# Patient Record
Sex: Male | Born: 1935 | Race: White | Hispanic: No | Marital: Married | Smoking: Former smoker
Health system: Southern US, Community
[De-identification: ages and names within clinical notes are randomized; demographics above are authoritative.]

## PROBLEM LIST (undated history)

## (undated) DIAGNOSIS — Z950 Presence of cardiac pacemaker: Secondary | ICD-10-CM

## (undated) DIAGNOSIS — F329 Major depressive disorder, single episode, unspecified: Secondary | ICD-10-CM

## (undated) DIAGNOSIS — F419 Anxiety disorder, unspecified: Secondary | ICD-10-CM

## (undated) DIAGNOSIS — N183 Chronic kidney disease, stage 3 unspecified: Secondary | ICD-10-CM

## (undated) DIAGNOSIS — I2510 Atherosclerotic heart disease of native coronary artery without angina pectoris: Secondary | ICD-10-CM

## (undated) DIAGNOSIS — M1990 Unspecified osteoarthritis, unspecified site: Secondary | ICD-10-CM

## (undated) DIAGNOSIS — I509 Heart failure, unspecified: Secondary | ICD-10-CM

## (undated) DIAGNOSIS — K219 Gastro-esophageal reflux disease without esophagitis: Secondary | ICD-10-CM

## (undated) DIAGNOSIS — E785 Hyperlipidemia, unspecified: Secondary | ICD-10-CM

## (undated) DIAGNOSIS — I4891 Unspecified atrial fibrillation: Secondary | ICD-10-CM

## (undated) DIAGNOSIS — E119 Type 2 diabetes mellitus without complications: Secondary | ICD-10-CM

## (undated) DIAGNOSIS — C679 Malignant neoplasm of bladder, unspecified: Secondary | ICD-10-CM

## (undated) DIAGNOSIS — N289 Disorder of kidney and ureter, unspecified: Secondary | ICD-10-CM

## (undated) DIAGNOSIS — Z8719 Personal history of other diseases of the digestive system: Secondary | ICD-10-CM

## (undated) DIAGNOSIS — F32A Depression, unspecified: Secondary | ICD-10-CM

## (undated) DIAGNOSIS — I10 Essential (primary) hypertension: Secondary | ICD-10-CM

## (undated) HISTORY — DX: Atherosclerotic heart disease of native coronary artery without angina pectoris: I25.10

## (undated) HISTORY — DX: Heart failure, unspecified: I50.9

## (undated) HISTORY — PX: HEMORRHOID SURGERY: SHX153

## (undated) HISTORY — DX: Unspecified atrial fibrillation: I48.91

## (undated) HISTORY — DX: Hyperlipidemia, unspecified: E78.5

## (undated) HISTORY — PX: CATARACT EXTRACTION W/ INTRAOCULAR LENS  IMPLANT, BILATERAL: SHX1307

## (undated) HISTORY — PX: NASAL SEPTUM SURGERY: SHX37

## (undated) HISTORY — DX: Essential (primary) hypertension: I10

## (undated) HISTORY — DX: Disorder of kidney and ureter, unspecified: N28.9

## (undated) HISTORY — PX: TRANSURETHRAL RESECTION OF PROSTATE: SHX73

---
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
## 1989-04-15 HISTORY — PX: CAROTID ENDARTERECTOMY: SUR193

## 2000-06-08 ENCOUNTER — Encounter: Payer: Self-pay | Admitting: *Deleted

## 2000-06-09 ENCOUNTER — Inpatient Hospital Stay (HOSPITAL_COMMUNITY): Admission: RE | Admit: 2000-06-09 | Discharge: 2000-06-10 | Payer: Self-pay | Admitting: *Deleted

## 2000-06-09 ENCOUNTER — Encounter (INDEPENDENT_AMBULATORY_CARE_PROVIDER_SITE_OTHER): Payer: Self-pay | Admitting: *Deleted

## 2002-08-15 DIAGNOSIS — I2510 Atherosclerotic heart disease of native coronary artery without angina pectoris: Secondary | ICD-10-CM

## 2002-08-15 HISTORY — DX: Atherosclerotic heart disease of native coronary artery without angina pectoris: I25.10

## 2003-02-06 ENCOUNTER — Ambulatory Visit (HOSPITAL_COMMUNITY): Admission: RE | Admit: 2003-02-06 | Discharge: 2003-02-06 | Payer: Self-pay | Admitting: Cardiology

## 2003-02-14 ENCOUNTER — Encounter: Payer: Self-pay | Admitting: Cardiothoracic Surgery

## 2003-02-19 ENCOUNTER — Encounter: Payer: Self-pay | Admitting: Cardiothoracic Surgery

## 2003-02-19 ENCOUNTER — Inpatient Hospital Stay (HOSPITAL_COMMUNITY): Admission: RE | Admit: 2003-02-19 | Discharge: 2003-02-24 | Payer: Self-pay | Admitting: Cardiothoracic Surgery

## 2003-02-19 HISTORY — PX: CORONARY ARTERY BYPASS GRAFT: SHX141

## 2003-02-20 ENCOUNTER — Encounter: Payer: Self-pay | Admitting: Cardiothoracic Surgery

## 2003-02-21 ENCOUNTER — Encounter: Payer: Self-pay | Admitting: Cardiothoracic Surgery

## 2003-02-22 ENCOUNTER — Encounter: Payer: Self-pay | Admitting: Surgery

## 2003-03-17 ENCOUNTER — Encounter (HOSPITAL_COMMUNITY): Admission: RE | Admit: 2003-03-17 | Discharge: 2003-06-15 | Payer: Self-pay | Admitting: Cardiology

## 2003-06-02 ENCOUNTER — Encounter (INDEPENDENT_AMBULATORY_CARE_PROVIDER_SITE_OTHER): Payer: Self-pay

## 2003-06-02 ENCOUNTER — Ambulatory Visit (HOSPITAL_COMMUNITY): Admission: RE | Admit: 2003-06-02 | Discharge: 2003-06-02 | Payer: Self-pay | Admitting: Urology

## 2003-06-02 ENCOUNTER — Ambulatory Visit (HOSPITAL_BASED_OUTPATIENT_CLINIC_OR_DEPARTMENT_OTHER): Admission: RE | Admit: 2003-06-02 | Discharge: 2003-06-02 | Payer: Self-pay | Admitting: Urology

## 2005-01-04 ENCOUNTER — Inpatient Hospital Stay (HOSPITAL_BASED_OUTPATIENT_CLINIC_OR_DEPARTMENT_OTHER): Admission: RE | Admit: 2005-01-04 | Discharge: 2005-01-04 | Payer: Self-pay | Admitting: Cardiology

## 2006-09-16 ENCOUNTER — Emergency Department (HOSPITAL_COMMUNITY): Admission: EM | Admit: 2006-09-16 | Discharge: 2006-09-16 | Payer: Self-pay | Admitting: Emergency Medicine

## 2008-03-14 ENCOUNTER — Ambulatory Visit: Payer: Self-pay | Admitting: Internal Medicine

## 2008-03-31 ENCOUNTER — Ambulatory Visit: Payer: Self-pay | Admitting: Internal Medicine

## 2008-03-31 LAB — CONVERTED CEMR LAB
Basophils Relative: 0.8 % (ref 0.0–3.0)
CO2: 28 meq/L (ref 19–32)
Calcium: 9 mg/dL (ref 8.4–10.5)
Creatinine, Ser: 1.5 mg/dL (ref 0.4–1.5)
GFR calc Af Amer: 58 mL/min
Glucose, Bld: 131 mg/dL — ABNORMAL HIGH (ref 70–99)
HCT: 40.4 % (ref 39.0–52.0)
Hemoglobin: 14.1 g/dL (ref 13.0–17.0)
INR: 1.9 — ABNORMAL HIGH (ref 0.8–1.0)
Lymphocytes Relative: 19.9 % (ref 12.0–46.0)
MCHC: 34.9 g/dL (ref 30.0–36.0)
Monocytes Absolute: 0.9 10*3/uL (ref 0.1–1.0)
Monocytes Relative: 9.6 % (ref 3.0–12.0)
Neutro Abs: 6.1 10*3/uL (ref 1.4–7.7)
Prothrombin Time: 20.8 s — ABNORMAL HIGH (ref 10.9–13.3)
RBC: 4.04 M/uL — ABNORMAL LOW (ref 4.22–5.81)
RDW: 12.2 % (ref 11.5–14.6)

## 2008-04-04 ENCOUNTER — Ambulatory Visit (HOSPITAL_COMMUNITY): Admission: RE | Admit: 2008-04-04 | Discharge: 2008-04-05 | Payer: Self-pay | Admitting: Internal Medicine

## 2008-04-04 ENCOUNTER — Ambulatory Visit: Payer: Self-pay | Admitting: Internal Medicine

## 2008-04-30 ENCOUNTER — Ambulatory Visit: Payer: Self-pay

## 2008-07-22 ENCOUNTER — Ambulatory Visit: Payer: Self-pay | Admitting: Internal Medicine

## 2008-09-02 ENCOUNTER — Ambulatory Visit: Payer: Self-pay | Admitting: *Deleted

## 2008-09-25 ENCOUNTER — Encounter: Payer: Self-pay | Admitting: Internal Medicine

## 2009-02-26 ENCOUNTER — Encounter: Payer: Self-pay | Admitting: Internal Medicine

## 2009-04-08 ENCOUNTER — Encounter: Admission: RE | Admit: 2009-04-08 | Discharge: 2009-04-08 | Payer: Self-pay | Admitting: Cardiology

## 2009-04-17 HISTORY — PX: CARDIOVASCULAR STRESS TEST: SHX262

## 2009-04-17 HISTORY — PX: US ECHOCARDIOGRAPHY: HXRAD669

## 2009-04-29 ENCOUNTER — Encounter (INDEPENDENT_AMBULATORY_CARE_PROVIDER_SITE_OTHER): Payer: Self-pay | Admitting: *Deleted

## 2010-03-26 ENCOUNTER — Encounter (INDEPENDENT_AMBULATORY_CARE_PROVIDER_SITE_OTHER): Payer: Self-pay | Admitting: *Deleted

## 2010-04-01 ENCOUNTER — Encounter: Payer: Self-pay | Admitting: Internal Medicine

## 2010-08-30 ENCOUNTER — Ambulatory Visit: Payer: Self-pay | Admitting: Cardiology

## 2010-09-01 ENCOUNTER — Encounter (INDEPENDENT_AMBULATORY_CARE_PROVIDER_SITE_OTHER): Payer: Self-pay | Admitting: *Deleted

## 2010-09-16 NOTE — Letter (Signed)
Summary: Appointment - Reminder 2  [facility], Main Office  [address]   [place], [place] [address]   Phone: [phone]  Fax: [phone]     September 01, 2010 MRN: [id number]   [name] [address] [place], [place]  [address]   Dear [name],  Our records indicate that it is past time to schedule a follow-up appointment.  Dr.Klein recommended that you follow up with [place] in December 2009 and we have been trying to contact you. It is very important that we reach you to schedule this appointment. We look forward to participating in your health care needs. Please contact us at the number listed above at your earliest convenience to schedule your appointment.  If you are unable to make an appointment at this time, give [place] a call so we can update our records.     Sincerely,   Glass blower/designer

## 2010-09-16 NOTE — Cardiovascular Report (Signed)
Summary: Certified Letter - Delivered (following [place] Card.)  Certified Letter - Delivered (following [place] Card.)   Imported By: Debby Freiberg 06/21/2010 14:18:04  _____________________________________________________________________  External Attachment:    Type:   Image     Comment:   External Document

## 2010-09-16 NOTE — Letter (Signed)
Summary: Device-Delinquent Check  [facility], Main Office  [address]   [place], [place] [address]   Phone: [phone]  Fax: [phone]     March 26, 2010 MRN: [id number]   [facility] [address] [place], [place]  [address]   Dear Mr. BROCKBANK,  According to our records, you have not had your implanted device checked in the recommended period of time.  We are unable to determine appropriate device function without checking your device on a regular basis.  Please call our office to schedule an appointment, with Dr. Graciela Husbands,  as soon as possible.  If you are having your device checked by another physician, please call us so that we may update our records.  Thank you, Altha Harm, LPN  March 26, 2010 2:09 PM   [facility]  certified mail

## 2010-12-28 NOTE — Letter (Signed)
March 14, 2008    Peter M. Swaziland, M.D.  [address]  [place], [place] [address]   RE:  DYWANE, PERUSKI  MRN:  [id number]  /  DOB:  12-09-1924   Dear Theron Arista,   It was a pleasure to see Redell Nazir today.  I am sorry I am not going to  get a chance to talk to you as I will be out of town next week. As you  know, Mr. Rhinehart is a delightful 77 year old gentleman with ischemic  heart disease who underwent bypass surgery in 2004, after presenting  with congestive heart failure.  He has I think from what I am inferring  a mixed cardiomyopathy with global hypokinesia and no specific wall  motion abnormalities.  Functionally, he has done amazingly well since  his bypass surgery and can do just about anything he wants.  He denies  shortness of breath, nocturnal dyspnea, edema or fatigue.   He also denies syncope.  He does have palpitations occasionally, which  are related to chronic atrial fibrillation.  He also has known chronic  bifascicular block.   His thromboembolic risk factors are notable for:  A.  Hypertension.  B.  Diabetes.  C.  Age.  D.  LV dysfunction.   He has had no prior strokes.   PAST MEDICAL HISTORY:  In addition to the above is notable for:  1. Bladder cancer - recurrent.  2. Gout.  3. Hiatal hernia.  4. Psychosocial stress.   PAST SURGICAL HISTORY:  1. Bladder cancer.  2. Carotid endarterectomy.  3. Bypass surgery.   MEDICATIONS:  1. Metformin 500 b.i.d.  2. Aspirin 81.  3. Lisinopril 40.  4. Lovastatin 20.  5. Niacin 1000.  6. Furosemide 40.  7. Calcium and warfarin.  He does not take beta-blockers because of      bradycardia that occurred while he was in sinus rhythm that raised      to the 50s, and he is ALLERGIC TO PENICILLIN.   SOCIAL HISTORY:  He is married.  He is subsequently remarried.  He has  two children, one of whom is gay and the other one committed suicide  about 6 years ago.  He does not use cigarettes or recreational drugs.  He  does drink alcohol daily.  He is quite vigorous and active.   PHYSICAL EXAMINATION:  GENERAL:  He is an elderly Caucasian male  appearing younger than his stated age of 10.  VITAL SIGNS:  His blood pressure is 126/76, his pulse was 86.  His  weight was 185.  HEENT:  Demonstrated no icterus or xanthomata.  NECK:  His neck veins were flat.  His carotids are brisk and full.  I  did not hear a bruit.  He had a scar on his right neck.  BACK:  Without kyphosis or scoliosis.  LUNGS:  Clear.  HEART:  Sounds were irregular with a 2/6 murmur.  ABDOMEN:  Soft with active bowel sounds without midline pulsation or  hepatomegaly.  EXTREMITIES:  Femoral pulses were 2+, distal pulses were intact.  There  is no clubbing, cyanosis or edema.  NEUROLOGICAL:  Grossly normal.  SKIN:  Warm and dry.   Electrocardiogram dated today demonstrated atrial fibrillation with  bursts of rapid rates and then slowing of rates, the mean was about 80.  The axis was leftward.  There was a right bundle branch block with  intervals of - 0.16/0.40.   IMPRESSION:  1. Ischemic cardiomyopathy.  a.     Status post bypass surgery.      b.     Ejection fraction of 20-25%.      c.     Global hypokinesia.  2. Chronic bifascicular block.  3. Permanent atrial fibrillation with a controlled ventricular      response.  4. Psychosocial stress is noted.  5. Peripheral vascular disease with prior carotid endarterectomy.  6. Class I congestive symptoms.   DISCUSSION:  Theron Arista, Mr. Innes has an ischemic cardiomyopathy with a  depressed left ventricular function and his functional status is really  quite good.  There is agroup of patients in the major 2 trial that was  found to have improved survival with ICD implantation for primary  prevention, and we reviewed this data and he was agreeable to  proceeding.  We discussed the potential benefits, as well as the  potential risks, including but not limited to device malfunction,   inappropriate shocks, death, perforation, infection and lead  dislodgement, and he would like to proceed.   The other question that I had for you is whether now that he is an  atrial fibrillation and will get back at brady pacing whether it is  worth trying to reinitiate beta-blocker therapy for him.   I told him I would defer this to your expertise.    Sincerely,      Duke Salvia, MD, [facility]  Electronically Signed    SCK/MedQ  DD: 03/14/2008  DT: 03/14/2008  Job #: [id number]

## 2010-12-28 NOTE — Procedures (Signed)
CAROTID DUPLEX EXAM   INDICATION:  Follow-up carotid artery disease.  Known left ICA  occlusion.   HISTORY:  Diabetes:  Yes.  Cardiac:  CABG, CHF.  Hypertension:  Yes.  Smoking:  Quit in 1961.  Previous Surgery:  Right CEA in 2001 by Dr. Madilyn Fireman.  CV History:  No.  Amaurosis Fugax No, Paresthesias No, Hemiparesis No.                                       RIGHT             LEFT  Brachial systolic pressure:         148               150  Brachial Doppler waveforms:         WNL               WNL  Vertebral direction of flow:        Antegrade         Antegrade  DUPLEX VELOCITIES (cm/sec)  CCA peak systolic                   61                57  ECA peak systolic                   104               252  ICA peak systolic                   76                Occluded  ICA end diastolic                   31                Occluded  PLAQUE MORPHOLOGY:                  N/A               Mixed  PLAQUE AMOUNT:                      N/A               Occluded ICA  PLAQUE LOCATION:                    N/A               ICA/ECA   IMPRESSION:  1. Right internal carotid artery shows no evidence of restenosis,      status post carotid endarterectomy.  2. Left internal carotid artery known occlusion.  3. Left external carotid artery stenosis.   ___________________________________________  P. Liliane Bade, M.D.   AS/MEDQ  D:  09/02/2008  T:  09/02/2008  Job:  [id number]

## 2010-12-28 NOTE — Discharge Summary (Signed)
[name], [name] NO.:  [id number]   MEDICAL RECORD NO.:  [id number]          PATIENT TYPE:  INP   LOCATION:  2035                         FACILITY:  [facility]   PHYSICIAN:  Duke Salvia, MD, FACCDATE OF BIRTH:  12-04-1924   DATE OF ADMISSION:  04/04/2008  DATE OF DISCHARGE:  04/05/2008                               DISCHARGE SUMMARY   ALLERGIES:  This patient has allergy to PENICILLIN.   TIME FOR THIS DICTATION AND EXAM:  Greater than 40 minutes.   FINAL DIAGNOSIS:  On discharging day #1, status post implant of a  Medtronic VIRTUOSO dual-chamber implantable cardioverter-  defibrillator/defibrillation threshold less than or equal to 20 joules.  Device implanted for primary prevention.   SECONDARY DIAGNOSES:  1. Ischemic cardiomyopathy.      a.     Echocardiogram in June 2009 ejection fraction of 25-30%.       Echocardiogram in August 2009 ejection fraction 25% despite       maximized medical therapy.  2. New York Heart Association class II.  3. Chronic systolic congestive heart failure.  4. History of coronary artery disease status post coronary artery      bypass graft surgery in 2004.      a.     A catheterization in May 2006 all grafts patent.  5. Atrial fibrillation, atrial flutter/Coumadin.  6. Diabetes.  7. Chronic renal insufficiency.  8. Hypertension.  9. History of bladder cancer with recurrence.  10.Gout.  11.Hiatal hernia.   PROCEDURE:  On April 05, 2008, implant of a Medtronic Virtuoso dual-  chamber cardioverter defibrillator/defibrillator threshold study less  than or equal to 20 joules, Dr. Sherryl Manges.   BRIEF HISTORY:  [name] is an 77 year old male.  He had coronary  bypass in 2004, at that time he was in congestive heart failure.  The  patient does have a mixed cardiomyopathy with global hypokinesia and no  specific wall motion abnormalities.  The patient has been maintained on  maximum medical therapy, but his ejection  fraction has not improved  thereby.   The patient denies syncope.  He does have palpitations occasionally.  These are related to his chronic atrial fibrillation.  He also has  chronic bifascicular block.  Electrocardiogram shows atrial fibrillation  and right bundle-branch block.   [name] has an ischemic cardiomyopathy.  He has depressed left  ventricular function.  His functional status is good.  There are  studies, which improved survival with ICD implantation for primary  prevention.  The risks and benefits of this implantation had been  discussed with the patient and he wishes to proceed.   HOSPITAL COURSE:  The patient presents electively on April 04, 2008.  He underwent implantation of the Medtronic device by Dr. Graciela Husbands as a dual-  chamber device.  The question remains whether the patient could possibly  be up titrated to a beta-blocker now that he has backup pacing.  The  chest x-ray will be examined on postprocedure day #1.  The device will  be interrogated.  Mobility of the left arm will  be discussed with the  patient.  He will receive oral analgesia for any discomfort.  He will  discharge on the following medicines:  1. Coumadin 1 mg on Tuesday, Thursday, Saturday, and Sunday and 2 mg      on Monday, Wednesday, and Friday.  2. Metformin 500 mg twice daily.  3. Enteric-coated aspirin 81 mg daily.  4. Lisinopril 20 mg on Sunday and 40 mg on Tuesday, Thursday, and      Saturday.  5. Lovastatin 20 mg daily at bedtime.  6. Niacin 1000 mg daily.  7. Lasix 40 mg daily.  8. Calcium 600 mg twice daily.  He was asked to keep his incision dry for the next 7 days and to sponge  bathe until next Friday, April 11, 2008.  He follows up with [employer], [facility] at [employer] for the [facility].  Our office  will call with that appointment.   LABORATORY STUDIES:  Pertinent to this admission were drawn on March 31, 2008, white cells 9.5, hemoglobin 14.1, hematocrit  40.4, platelets  of 232, protime 20.8, INR 1.9, sodium 138, potassium 4.5, chloride 104,  carbonate 28, glucose 131, creatinine is 1.5, and BUN is 25.      Maple Mirza, [place]      Duke Salvia, MD, [facility]  Electronically Signed    GM/MEDQ  D:  04/04/2008  T:  04/05/2008  Job:  [id number]   cc:   Peter M. Swaziland, M.D.  Barry Dienes Eloise Harman, M.D.

## 2010-12-28 NOTE — Assessment & Plan Note (Signed)
[facility]                         ELECTROPHYSIOLOGY OFFICE NOTE   JAYMIE, MISCH                         MRN:          [id number]  DATE:07/22/2008                            DOB:          1924-09-26    Mr. Ausburn was seen in followup for an ICD implanted for primary  prevention of sudden ischemic heart disease.  He has permanent atrial  fibrillation (relatively) having gone into sinus rhythm at the time of  his ICD.  He maintained sinus rhythm for a couple of months.  He noted  no change in his symptoms in the interval in 4 months.  He has no  complaints of chest pain, shortness of breath, or exercise intolerance  at this time.   His medications are unchanged and notable for Warfarin as well as  metformin, lisinopril, lovastatin, and niacin.  He is notably not on the  beta-blocker.   On examination, his blood pressure was 139/82.  His pulse is 54, hence  the absence of a beta-blocker.  The lungs were clear.  Heart sounds were  regular.  Extremities were without edema.   Interrogation of his Medtronic Virtuoso device demonstrates a flutter  wave of 6.2 with impedance of 568 and R wave of 3.6 with impedance of  400, threshold of 1 volt at 0.4, battery voltage was 3.2.   IMPRESSION:  1. Atrial fibrillation.  2. Ischemic cardiomyopathy.  3. Status post implantable cardioverter-defibrillator for the above.   We will see him again in nine months' time at his anniversary in the  event that Dr. Thomasene Lot will like to assume all of his care.  I will look  forward to hearing from him about that.     Duke Salvia, MD, [facility]  Electronically Signed    SCK/MedQ  DD: 07/22/2008  DT: 07/23/2008  Job #: [id number]   cc:   Peter M. Swaziland, M.D.

## 2010-12-31 NOTE — Discharge Summary (Signed)
NAME:  [name], ARNS                            ACCOUNT NO.:  [id number]   MEDICAL RECORD NO.:  [id number]                   PATIENT TYPE:  INP   LOCATION:  2029                                 FACILITY:  [facility]   PHYSICIAN:  Gwenith Daily. Tyrone Sage, M.D.            DATE OF BIRTH:  1924/12/21   DATE OF ADMISSION:  02/19/2003  DATE OF DISCHARGE:  02/24/2003                                 DISCHARGE SUMMARY   ADMITTING DIAGNOSES:  1. Coronary artery disease.  2. Congestive heart failure.   ADDITIONAL DISCHARGE DIAGNOSES:  1. Coronary artery disease.  2. New-onset congestive heart failure.  3. Pulmonary hypertension.  4. Status post right carotid endarterectomy.  5. Hypertension.  6. Type 2 non-insulin-dependent diabetes mellitus.  7. Hypercholesterolemia.  8. History of bladder cancer.   PROCEDURES PERFORMED:  1. Coronary artery bypass grafting x2 (left internal mammary artery to the     LAD, saphenous vein graft to the circumflex coronary).  2. Endoscopic vein harvest, right thigh.   HISTORY:  The patient is a 77 year old white male with a one-month history  of cough and progressive shortness of breath on exertion.  He denied chest  pain, diaphoresis, nausea or vomiting with his symptoms.  He initially saw  Dr. Eloise Harman, who diagnosed him congestive heart failure and started him on  hydrochlorothiazide and Diovan.  He has had marked clinical improvement  since that time.  He underwent an echocardiogram on January 24, 2003, which  showed mild left ventricular enlargement with severe diastolic dysfunction.  Ejection fraction was 25-30% with severe hypokinesis in the anterior and  lateral walls.  He is also noted to have bi-atrial enlargement and severe  pulmonary hypertension.  Because of these findings and multiple risk factors  for coronary artery disease, he was referred to Dr. Peter Swaziland.  He  underwent cardiac catheterization, which showed severe coronary artery  disease, which  was not felt to be amenable to percutaneous intervention.  He  was seen by Dr. Sheliah Plane and was felt to be a good candidate for  surgical revascularization.  After a complete outpatient workup, he agreed  to proceed.   HOSPITAL COURSE:  He was admitted on July 07 and taken to the operating  room, where he underwent CABG x2 with the above-noted grafts.  He tolerated  the procedure well.  Was transferred to the SICU in stable condition.  He  was extubated shortly after surgery.  He was hemodynamically stable,  although on milrinone and dopamine drips, post-operatively.  He was finally  weaned, and drips were discontinued.   By postop day #2, blood pressures were stable, off all drips.  He did  require atrial-pacing to maintain heart rate, as his sinus rhythm was  bradycardic in the 50s.  His beta blocker was held, and he was transferred  to the floor.  He was restarted on his home dose of Glucophage.  Postop day #3, his A pacer was weaned and discontinued.  Since that time, he  has been maintaining sinus rhythm with his intrinsic rate around 65.  He has  been afebrile, and all vital signs have been stable.  He has been diuresed,  although he is still about 9 pounds above his preoperative weight.  His  blood sugars have remained stable in the 100-120 range on his home  medications.  His surgical incision sites are healing well.  He has been  ambulating in the halls without difficulty and has been weaned off  supplemental oxygen, maintaining greater than 90% O2 sats on room air.  It  is felt that since he is doing well, if he remains stable, he will be ready  for discharge home on February 24, 2003.   DISCHARGE MEDICATIONS:  1. Tylox 1-2 q.4h. p.r.n. for pain.  2. Enteric-coated aspirin 325 mg q.d.  3. Lasix 40 mg q.d. x1 week.  4. K-Dur 20 mEq q.d. x1 week.  5. Mevacor 20 mg q.d.  6. Niacin 500 mg q.d.  7. Diovan 80 mg b.i.d.  8. Glucophage 500 mg b.i.d.   DISCHARGE  INSTRUCTIONS:  1. He is to refrain from driving, heavy lifting or strenuous activity.  2. He may continue to increase ambulation and use of his incentive     spirometer.  3. He was asked to shower daily and cleanse incisions with soap and water.   DIET:  He will continue a low-fat, low-sodium diet.   DISCHARGE FOLLOW UP:  1. He has a scheduled appointment to see Dr. Roger Shelter in 2 weeks and have a     chest x-ray at that visit.  2. Dr. Dennie Maizes office will call and set up an appointment for him to see     Dr. Tyrone Sage in 3 weeks, and he should bring his chest x-ray to this     appointment for Dr. Tyrone Sage to review.  3. He will call our office in the interim if he experiences any problems or     has questions.     Coral Ceo, P.A.                        Gwenith Daily Tyrone Sage, M.D.    GC/MEDQ  D:  02/23/2003  T:  02/24/2003  Job:  [id number]   cc:   Peter M. Swaziland, M.D.  [address]  [place], [place] [address]  Fax: [phone]   Barry Dienes. Eloise Harman, M.D.  [address]  [place]  [place] [address]  Fax: [phone]    cc:   Peter M. Swaziland, M.D.  [address]  [place], [place] [address]  Fax: [phone]   Barry Dienes. Eloise Harman, M.D.  [address]  [place]  [place] [address]  Fax: [phone]

## 2010-12-31 NOTE — H&P (Signed)
NAMEJAYMOND, WAAGE NO.:  [id number]   MEDICAL RECORD NO.:  [id number]                  PATIENT TYPE:  OIB   LOCATION:                                       FACILITY:  [facility]   PHYSICIAN:  Peter M. Swaziland, M.D.               DATE OF BIRTH:  15-Oct-1924   DATE OF ADMISSION:  02/06/2003  DATE OF DISCHARGE:                                HISTORY & PHYSICAL   HISTORY OF PRESENT ILLNESS:  The patient is a 77 year old white male with  history of non-insulin-dependent diabetes mellitus, hypertension, and  hyperlipidemia who presents with new onset of congestive heart failure.  He  developed two-month history of nocturnal wheezing associated with tightness  in his throat and chest.  He has complained of progressive shortness of  breath and cough.  On further evaluation by Barry Dienes. Eloise Harman, M.D. he is  felt to have congestive heart failure based on his examination and chest x-  ray.  He was started on hydrochlorothiazide and Diovan with clinical  improvement.  Subsequent echocardiogram was performed on January 24, 2003.  This showed mild left ventricular enlargement with severe systolic  dysfunction, ejection fraction of 25-30%.  There was global hypokinesia with  more severe hypokinesia of the anterolateral wall and restrictive  physiology.  He was also noted to have biatrial enlargement and severe  pulmonary hypertension.  Due to these findings and the fact that he has  multiple cardiac risk factors, he is now admitted for a cardiac  catheterization.   PAST MEDICAL HISTORY:  1. Diabetes mellitus type 2.  2. Hypertension.  3. Hypercholesterolemia.  4. Status post right carotid endarterectomy.  Documented occlusion of the     left internal carotid artery.  5. Status post removal of bladder tumor by Maretta Bees. Vonita Moss, M.D.  6. Status post T&A and nasal surgery.  7. History of seven rib fractures and clavicle fracture related to trauma.   ALLERGIES:   PENICILLIN.   MEDICATIONS:  1. Metformin 500 mg p.o. b.i.d.  2. Lovastatin 20 mg daily.  3. Niacin 500 mg daily.  4. Aspirin 81 mg daily.  5. Diovan 80 mg b.i.d.  6. HCTZ 12.5 mg daily.   SOCIAL HISTORY:  The patient is retired.  He quit smoking 40 years ago.  He  is married and has one living child.  One child died this past year of  suicide.   FAMILY HISTORY:  Father died of unknown causes.  Mother died with myocardial  infarction.  He has no siblings.   REVIEW OF SYSTEMS:  As noted in HPI.  He has had no hematuria recently.  He  denies any increased edema.  No history of TIA or stroke.  No prior history  of myocardial infarction.  All other review of systems are negative.   PHYSICAL EXAMINATION:  GENERAL:  The patient is a  well-developed white male  in no apparent distress.  VITAL SIGNS:  Weight 139, blood pressure 142/80, pulse 60 and regular.  HEENT:  Pupils are equal, round, and reactive.  Conjunctivae are clear.  Oropharynx is clear.  NECK:  Without JVD, adenopathy, or thyromegaly.  He has old right carotid  endarterectomy scar.  He also has deformation of his left clavicle.  LUNGS:  Clear to auscultation and percussion.  CARDIAC:  Regular rate and rhythm without S3.  There is soft systolic murmur  at the left sternal border.  ABDOMEN:  Soft and nontender without masses or hepatosplenomegaly.  EXTREMITIES:  Femoral and pedal pulses are 2+ and symmetric.  He has no  edema.  NEUROLOGIC:  Nonfocal.   LABORATORY DATA:  ECG shows normal sinus rhythm, first degree AV block, left  anterior fascicular block, right bundle branch block.  There is also  evidence of LVH by voltage.  Chest x-ray shows cardiomegaly with mild  effusions.  BNP was 405.  White count 9300, hemoglobin 16.5, hematocrit  49.8, platelets 286,000.  Other laboratories were pending.   IMPRESSION:  1. New onset of congestive heart failure with severe left ventricular     dysfunction.  2. Severe pulmonary  hypertension.  3. Trifascicular block.  4. Carotid arterial disease status post right carotid endarterectomy with     documented occlusion of the left carotid.  5. Hypertension.  6. Diabetes mellitus type 2.  7. Hypercholesterolemia.  8. History of bladder tumor.   PLAN:  The patient will be admitted for right and left heart  catheterization, coronary angiography.  His Diovan dose was increased.  If  his filling pressures remain high, may recommend switching him to a loop  diuretic.  At this point would avoid beta blocker therapy because of his  significant conduction abnormalities on ECG and low resting pulse.                                               Peter M. Swaziland, M.D.    PMJ/MEDQ  D:  02/01/2003  T:  02/03/2003  Job:  [id number]   cc:   Barry Dienes. Eloise Harman, M.D.  [address]  [place]  [place] [address]  Fax: [phone]    cc:   Barry Dienes. Eloise Harman, M.D.  [address]  [place]  [place] [address]  Fax: [phone]

## 2010-12-31 NOTE — Cardiovascular Report (Signed)
NAME:  [name], [name]                            ACCOUNT NO.:  [id number]   MEDICAL RECORD NO.:  [id number]                   PATIENT TYPE:  OIB   LOCATION:  2856                                 FACILITY:  [facility]   PHYSICIAN:  Peter M. Swaziland, M.D.               DATE OF BIRTH:  Nov 25, 1924   DATE OF PROCEDURE:  02/06/2003  DATE OF DISCHARGE:                              CARDIAC CATHETERIZATION   INDICATION FOR PROCEDURE:  The patient is a 77 year old white male with  history of diabetes mellitus, hypertension, hyperlipidemia who presents with  new onset congestive heart failure.  Echocardiogram shows severe left  ventricular dysfunction and pulmonary hypertension.   ACCESS:  Via the right femoral artery and vein using standard Seldinger  technique.   EQUIPMENT:  6-French 4 cm right and left Judkins catheter, 6-French pigtail  catheter, 6-French arterial sheath, 8-French venous sheath, 7-French balloon  tip Swan-Ganz catheter.   CONTRAST:  140 mL of Omnipaque.   MEDICATIONS:  Local anesthesia 1% Xylocaine.   HEMODYNAMIC DATA:  1. Right atrial pressure is 3/3 with a mean of 2 mmHg.  2. Right ventricular pressure is 56 with an EDP of 4 mmHg.  3. Pulmonary artery pressure is 60/24 with a mean of 38 mmHg.  4. Pulmonary capillary wedge pressure 17/21 with a mean of 15 mmHg.  5. Aortic pressure is 153/73 with a mean of 106 mmHg.  6. Left ventricular pressure is 147 with an EDP of 19 mmHg.  7. There is no aortic or mitral valve gradient.  8. Cardiac output by thermodilution is 4.8 L/minute with an index of 2.74.     By Fick cardiac output is 3.7 with an index of 2.11.   ANGIOGRAPHIC DATA:  1. Left ventricular angiography was performed in the RAO view.  This     demonstrates mild left ventricular dilatation with severe global     hypokinesia.  Ejection fraction is estimated at 30%.  There is mild     mitral insufficiency noted.  2. The left coronary artery arises and distributes  normally.  The left main     coronary artery is calcified with 50% narrowing in the distal left main.  3. There is 50% stenosis at the origin of the LAD.  The remainder of the     vessel is without significant obstructive disease.  4. The left circumflex coronary artery is heavily calcified proximally.     There is a 90-95% ostial stenosis.  The left circumflex gives rise to a     single large marginal branch which is without significant disease.  5. The right coronary artery arises and distributes normally.  It has     scattered minor disease in the proximal and mid vessel up to 20%.   FINAL INTERPRETATION:  1. Two vessel obstructive atherosclerotic coronary artery disease involving     the ostia of the left  anterior descending and left circumflex coronary     artery as well as moderate disease involving the distal left main     coronary artery.     The left circumflex lesion is critical and heavily calcified.  2. Severe left ventricular dysfunction.  3. Moderate pulmonary hypertension with relatively normal left ventricular     filling pressures.                                               Peter M. Swaziland, M.D.    PMJ/MEDQ  D:  02/06/2003  T:  02/06/2003  Job:  [id number]  Barry Dienes. Eloise Harman, M.D.  [address]  [place]  [place] [address]  Fax: [phone]   cc:   Barry Dienes. Eloise Harman, M.D.  [address]  [place]  [place] [address]  Fax: [phone]

## 2010-12-31 NOTE — Cardiovascular Report (Signed)
NAMEKARLTON, MAYA NO.:  [id number]   MEDICAL RECORD NO.:  [id number]          PATIENT TYPE:  OIB   LOCATION:  6501                         FACILITY:  [facility]   PHYSICIAN:  Peter M. Swaziland, M.D.  DATE OF BIRTH:  14-Sep-1924   DATE OF PROCEDURE:  01/04/2005  DATE OF DISCHARGE:                              CARDIAC CATHETERIZATION   INDICATIONS FOR PROCEDURE:  77 year old white male who is status post  coronary artery bypass surgery. Recent stress Cardiolite study suggested  evidence of anterior lateral ischemia with apical infarction. Ejection  fraction was depressed at 40%. Cardiac catheterization was indicated to rule  out graft failure.   PROCEDURES:  Left heart catheterization, coronary and left ventricular  angiography, saphenous vein graft angiography x1 and LIMA graft angiography.   EQUIPMENT USED:  A 4-French 4 cm right and left Judkins catheter, 4-French  pigtail catheter, 4-French arterial sheath.   ACCESS:  Via the right femoral artery using standard Seldinger technique.   MEDICATIONS:  Local anesthesia 1% Xylocaine.   CONTRAST:  Omnipaque 110 mL.   HEMODYNAMIC DATA:  Aortic pressure 156/68 with a mean of 103. Left ventricle  pressure is 162 with EDP of 18 mmHg.   ANGIOGRAPHIC DATA:  1.  The left coronary artery arises and distributes normally. There is a      bulky plaque in the distal left main coronary artery with 70% stenosis.      This is also moderately calcified.  2.  The left anterior descending artery has approximately 40% narrowing at      the ostium. There is competitive flow to the mid and distal LAD from the      IMA graft. The first diagonal branches without significant disease.  3.  The left circumflex coronary artery has a 90% ostial stenosis. The      marginal branch has competitive flow from the vein graft.  4.  The right coronary artery has mild irregularities approximately 10% in      the proximal mid vessel. No obstructive  disease is noted.  5.  Saphenous vein graft to the first obtuse marginal vessel was widely      patent with excellent distal runoff.  6.  The LIMA graft to the LAD is also widely patent with excellent distal      runoff.  7.  Left ventricular angiography was performed in the RAO view. This      demonstrates upper normal left ventricular size with global hypokinesia      and overall ejection fraction estimated at 40%. There is no significant      mitral insufficiency.   FINAL INTERPRETATION:  1.  Severe left main and two-vessel obstructive atherosclerotic coronary      artery disease.  2.  Patent LIMA graft to the LAD.  3.  Patent saphenous vein graft to obtuse marginal branch.  4.  Moderate left ventricular dysfunction.   PLAN:  Would recommend continued medical therapy.      PMJ/MEDQ  D:  01/04/2005  T:  01/04/2005  Job:  [id number]   cc:  Barry Dienes Eloise Harman, M.D.  [address]  [place]  [place] [address]  Fax: [phone]   Maretta Bees. Vonita Moss, M.D.  [address]  [place]  [place] [address]  Fax: [phone]

## 2010-12-31 NOTE — H&P (Signed)
[name], [name] NO.:  [id number]   MEDICAL RECORD NO.:  [id number]          PATIENT TYPE:  AMB   LOCATION:                               FACILITY:  [facility]   PHYSICIAN:  Peter M. Swaziland, M.D.  DATE OF BIRTH:  23-Dec-1924   DATE OF ADMISSION:  01/04/2005  DATE OF DISCHARGE:                                HISTORY & PHYSICAL   HISTORY OF PRESENT ILLNESS:  [name] is a 77 year old white male.  He has  known history of coronary artery disease.  He had presented initially with  symptoms of dyspnea at that time and was found to have severe LV  dysfunction.  Subsequent cardiac catheterization showed severe two-vessel  coronary disease and he subsequently underwent coronary artery bypass  surgery x2 by Dr. Tyrone Sage.  This included LIMA graft to the LAD and a  saphenous vein graft to the left circumflex coronary artery.  Since that  time he has done well without significant cardiac symptoms.  However, recent  follow-up Cardiolite study performed on Dec 28, 2004 patient was able to  exercise for seven minutes on the Bruce protocol.  Developed leg fatigue.  He had no significant chest pain or ECG changes.  His Cardiolite images  demonstrate a moderate reversible defect in the mid anterior lateral wall  and a fixed apical defect.  Ejection fraction was depressed at 40%.  Given  these findings on Cardiolite study and the fact that he lacked anginal  symptoms with his initial presentation, it was recommended he undergo repeat  coronary angiography to assess his graft status.  Patient has had no recent  orthopnea, PND, or edema.  He has had no palpitations or dizziness.   PAST MEDICAL HISTORY:  1.  ASCAD status post CABG as noted.  2.  Ischemic cardiomyopathy.  3.  Diabetes mellitus type 2.  4.  Hypertension.  5.  Hypercholesterolemia.  6.  Status post right carotid endarterectomy.  7.  History of recurrent bladder carcinoma status post resection and      fulguration.  8.  History of pulmonary hypertension.  9.  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   ALLERGIES:  PENICILLIN.   CURRENT MEDICATIONS:  1.  Metformin 500 mg b.i.d.  2.  Lovastatin 20 mg per day.  3.  Niacin 500 mg daily.  4.  Aspirin 81 mg per day.  5.  Lisinopril 40 mg per day.  6.  HCTZ 12.5 mg daily.   SOCIAL HISTORY:  Patient is retired.  He quit smoking 40 years ago.  He is  married and has one child living.  One child died of suicide.   FAMILY HISTORY:  Father died of unknown causes.  Mother died with myocardial  infarction.  He has no siblings.   REVIEW OF SYSTEMS:  He has had no recent bowel or bladder complaints.  He  denies any hematuria.  Has had no dizziness or syncope.  Appetite has been  good.  All other review of systems are negative.   PHYSICAL EXAMINATION:  GENERAL:  Patient is well-developed white  male in no  distress.  VITAL SIGNS:  Weight 141, blood pressure 140/80, pulse 60 and regular.  HEENT:  Normocephalic, atraumatic.  Pupils equal, round, reactive to  light/accommodation.  Extraocular movements are intact.  Oropharynx is  clear.  NECK:  Supple without JVD, adenopathy, thyromegaly, or bruits.  He has an  old right carotid endarterectomy scar.  He has deformation of his left  clavicle related to prior trauma.  LUNGS:  Clear to auscultation, percussion.  CARDIAC:  Regular rate and rhythm with a soft systolic murmur to the left  sternal border radiating to the apex.  ABDOMEN:  Soft, nontender without masses or bruits.  EXTREMITIES:  Good femoral and pedal pulses.  He has no lower extremity  edema.  NEUROLOGIC:  Nonfocal.   LABORATORY DATA:  His resting ECG shows normal sinus rhythm with right  bundle branch block.  His chest x-ray shows no active disease.  Coags are  normal.  CBC is normal.  Glucose 133, BUN 27, creatinine 1.7.  Electrolytes  are normal.   IMPRESSION:  1.  Abnormal stress Cardiolite study showing evidence of anterolateral       ischemia and apical infarction.  2.  Ischemic cardiomyopathy.  3.  ASCAD status post coronary artery bypass graft x2 in 2004.  4.  Hypertension.  5.  Diabetes mellitus type 2.  6.  Mild renal insufficiency.  7.  History of bladder carcinoma.  8.  Status post right carotid endarterectomy.  9.  History of pulmonary hypertension.   PLAN:  Will proceed with cardiac catheterization with coronary and graft  angiography.  Recommend he hold his hydrochlorothiazide and hydrate himself  prior to procedure and will also treat him with oral Mucomyst to try to  protect his kidneys.       ___________________________________________  Peter M. Swaziland, M.D.    PMJ/MEDQ  D:  12/30/2004  T:  12/30/2004  Job:  [id number]   cc:   Barry Dienes. Eloise Harman, M.D.  [address]  [place]  [place] [address]  Fax: [phone]   Maretta Bees. Vonita Moss, M.D.  [address]  [place]  [place] [address]  Fax: [phone]

## 2010-12-31 NOTE — Op Note (Signed)
[facility]  Patient:    [name], [name]                         MRN: [id number] Proc. Date: 06/09/00 Adm. Date:  [id number] Disc. Date: [id number] Attending:  Melvenia Needles CC:         Brunilda Payor, M.D., [place], [place]   Operative Report  SURGEON:  Denman George, M.D.  ASSISTANT:  Sherrie George, P.A.  ANESTHETIC:  General endotracheal.  ANESTHESIOLOGIST:  Cliffton Asters. Ivin Booty, M.D.  PREOPERATIVE DIAGNOSIS:  Severe right internal carotid artery stenosis with contralateral internal carotid artery occlusion.  POSTOPERATIVE DIAGNOSIS:  Severe right internal carotid artery stenosis with contralateral internal carotid artery occlusion.  PROCEDURE:  Right carotid endarterectomy with Dacron patch angioplasty.  CLINICAL NOTE:  This is a 77 year old male with type 2 diabetes, who is referred for evaluation of extracranial cerebrovascular occlusive disease. Doppler evaluation revealed evidence of severe right internal carotid artery stenosis with contralateral internal carotid artery occlusion.  The patient was seen and evaluated in the CVTS office, and it was recommended that he undergo right carotid endarterectomy.  This was carried out for reduction of stroke risks.  The risks of the operative procedure were explained to the patient, including the major complication rate of approximately 2% to include, but not limited to CVA, MI, and death.  The patient consented for surgery.  He was brought to the operating room at this time on an elective basis for scheduled surgery.  OPERATIVE PROCEDURE:  The patient was brought to the operating room in a stable condition.  He was placed in the supine position.  General endotracheal anesthesia was induced.  A Foley catheter and arterial line were inserted.  The right neck was prepped and draped in a sterile fashion.  A curvilinear skin incision was made along the anterior border of the right sternomastoid  muscle.  The incision was extended deeply through the subcutaneous tissue.  Dissection was carried down through the platysma with electrocautery.  Deep dissection was carried along the anterior border of the right sternomastoid muscle to the carotid sheath.  The right common carotid artery was mobilized and encircled with a vessel loop at the omohyoid muscle.  The carotid bifurcation was exposed.  The external carotid and superior thyroid were encircled with fine vessel loops.  The internal carotid artery was dissected distally up to the posterior belly of the digastric muscle.  The distal internal carotid artery was encircled with a vessel loop.  The carotid bifurcation revealed severe plaque disease.  The patient was administered 7000 units of heparin intravenously.  Adequate circulation time was permitted.  The carotid vessels were controlled with clamps.  A longitudinal arteriotomy was made in the distal common carotid artery.  The arteriotomy extended across the carotid bulb and up into the internal carotid artery.  The internal carotid artery origin revealed a severe stenosis estimated to be 90%.  A shunt was inserted.  The plaque was then removed with an endarterectomy elevator.  Proximally, the plaque was divided transversely with Pott scissors.  Plaque was raised up into the bulb where the superior thyroid and external carotid were Endarterectomies using an eversion technique.  The internal carotid artery was then endarterectomies distally and feathered out well.  Fragments of plaque were removed with plaque forceps.  The site was irrigated with heparin/saline solution.  A preclotted Dacron patch was placed over the endarterectomy site using running 6-0 Prolene suture.  At the completion of this, the shunt was removed. All vessels were well flushed.  Clamps were removed, directing the initial antegrade flow up the external carotid artery, and following this, the internal  carotid was released.  Excellent pulse and Doppler signal was present in the distal internal carotid artery.  The patient was administered 30 mg of protamine intravenously. Adequate hemostasis was obtained.  Sponge and instrument counts were correct.  The sternomastoid fascia was then closed with running 2-0 Vicryl sutures.  The platysma was closed with running 3-0 Vicryl sutures.  The skin was closed with 4-0 Monocryl.  Half-inch Steri-Strips were applied.  Anesthesia was reversed in the operating room.  The patient was awakened readily.  He moved all extremities to command.  He was transferred to the recovery room in stable condition. DD:  06/22/00 TD:  06/22/00 Job: [id number]

## 2010-12-31 NOTE — Op Note (Signed)
NAME:  [name], [name]                            ACCOUNT NO.:  [id number]   MEDICAL RECORD NO.:  [id number]                   PATIENT TYPE:  INP   LOCATION:  2311                                 FACILITY:  [facility]   PHYSICIAN:  Gwenith Daily. Tyrone Sage, M.D.            DATE OF BIRTH:  1925-05-31   DATE OF PROCEDURE:  02/19/2003  DATE OF DISCHARGE:                                 OPERATIVE REPORT   PREOPERATIVE DIAGNOSES:  Coronary occlusive disease with significant left  ventricular dysfunction.   POSTOPERATIVE DIAGNOSES:  Coronary occlusive disease with significant left  ventricular dysfunction.   OPERATION PERFORMED:  Coronary artery bypass grafting times two with the  left internal mammary artery to the left anterior descending coronary artery  and reversed saphenous vein graft to the circumflex coronary artery with  Endo vein harvesting.   SURGEON:  Gwenith Daily. Tyrone Sage, M.D.   ASSISTANT:  Toribio Harbour, N.P.   ANESTHESIA:  General.   INDICATIONS FOR PROCEDURE:  The patient is a 77 year old male who presents  with increasing chest discomfort and shortness of breath to Dr. Swaziland.  Cardiac evaluation including cardiac catheterization revealed high grade  circumflex obstruction proximally of greater than 90%.  Mid circumflex 70 to  80%, proximal LAD of 70%.  The right coronary artery was relatively free of  disease. The patient also was noted to have significant left ventricular  dysfunction and evidence of pulmonary hypertension.  Because of the  decreased left ventricular function and critical coronary occlusive disease,  coronary artery bypass grafting was recommended.  At the time of the  catheterization, the patient had very mild mitral regurgitation and because  of this, transesophageal echocardiogram probe was also placed at the time of  surgery to evaluate the mitral valve.   DESCRIPTION OF PROCEDURE:  With Swann-Ganz and arterial line monitors in  place, the patient  underwent general endotracheal anesthesia without  incident.  The skin of the chest and legs was prepped with Betadine and  draped in the usual sterile manner.  Transesophageal echocardiogram probe  showed normal mitral and aortic valves a trivial mitral regurgitation, even  with systolic and pulmonary artery pressures elevated.  There was global  hypokinesis most pronounced in the inferolateral wall.  The patient was  prepped and draped in the usual sterile manner.  Using [employer] endo vein  harvesting system, vein was harvested from the right thigh and was good  quality and caliber.  A median sternotomy was performed.  The left internal  mammary artery was dissected down as a pedicle graft.  The distal artery was  divided and had good free flow.  The pericardium was opened.  Overall  the  patient had evidence of biventricular enlargement and hypokinesis of the  left ventricle.  In the inferolateral wall was an area of transmural scar.  Consideration for off pump bypass had been entertained, but because of the  patient's degree of LV dysfunction, left ventricular hypertrophy and on  examination, the circumflex coronary artery was intramyocardial, it was felt  to safe to proceed with standard on pump bypass.  The patient was  systemically heparinized.  The ascending aorta and the right atrium were  cannulated in the aortic root.  A bent cardioplegia needle was introduced  into the ascending aorta.  The patient was placed on cardiopulmonary bypass  at 2.4L per minute per meter squared.  Sites for anastomosis were selected.  The circumflex vessel was dissected out of the epicardium for bypassing.  The patient's body temperature was cooled to 32 degrees.  An aortic  crossclamp was applied.  of cold blood potassium cardioplegia was  administered with rapid diastolic arrest of the heart.  Myocardial septal  temperature was monitored throughout the crossclamp period.   Attention was turned  first to the obtuse marginal coronary artery which was  opened and admitted a 1.5 mm probe.  Using running 7-0 Prolene, distal  anastomosis was performed.  Additional cold blood cardioplegia was  administered down the vein graft.  Attention was then turned to the left  anterior descending coronary artery which was opened.  The vessel was of  good quality, admitted a 1.5 mm probe.  Using a running 8-0 Prolene, the  left internal mammary artery was anastomosed to the left anterior descending  coronary artery.  With release of the Edwards bulldog on the mammary artery,  there was appropriate rise in myocardial septal temperature.  The aortic  cross-clamp was removed.  Total cross-clamp time was 33 minutes.  The  patient spontaneously converted to a sinus rhythm.  A partial occlusion  clamp was placed on the ascending aorta.  A single punch aortotomy was  performed.  Vein graft to the circumflex was anastomosed to the ascending  aorta.  Air was evacuated from the grafts and the partial occlusion clamp  was removed.  The patient was loaded with milrinone and started on low dose  dopamine. He was then ventilated and weaned from cardiopulmonary bypass  without difficulty.  There was some improvement of the anterior wall  function on transesophageal echocardiogram.  The patient was separated from  bypass without difficulty.  He was decannulated in the usual fashion.  Protamine sulfate was administered.  With the operative field hemostatic,  two atrial and two ventricular pacing wires were applied.  Graft markers  were applied.  A left pleural tube and two mediastinal tubes were left in  place.  Sternum was closed with #6 stainless steel wire.  Fascia closed with  interrupted 0 Vicryl, running 3-0 Vicryl in the subcutaneous tissues and 4-0  subcuticular stitch in the skin edges.  Dry dressings were applied.  Sponge and needle counts were reported as correct at the completion of the  procedure.  The  patient tolerated the procedure without obvious complication  and was transferred to the surgical intensive care unit for further  postoperative care.                                                 Gwenith Daily Tyrone Sage, M.D.    Tyson Babinski  D:  02/20/2003  T:  02/20/2003  Job:  [id number]   cc:   Peter M. Swaziland, M.D.  [address]  [place], [place] [address]  Fax:  [phone]  

## 2010-12-31 NOTE — Discharge Summary (Signed)
[place]. [facility]  Patient:    [name], [name]                         MRN: [id number] Adm. Date:  [id number] Disc. Date: [id number] Attending:  Melvenia Needles Dictator:   Sherrie George, P.A. CC:         Denman George, M.D.  Barry Dienes Eloise Harman, M.D.   Discharge Summary  DATE OF BIRTH: [birth year]  ADMISSION DIAGNOSES:  1. Severe right internal carotid artery stenosis, asymptomatic, with     complete occlusion of left internal carotid artery.  2. Adult onset diabetes mellitus, non-insulin dependent.  3. Bradycardia.  4. History of bladder cancer with resection in 1996.  DISCHARGE DIAGNOSES:  1. Severe right internal carotid artery stenosis, asymptomatic, with     complete occlusion of left internal carotid artery.  2. Adult onset diabetes mellitus, non-insulin dependent.  3. Bradycardia.  4. History of bladder cancer with resection in 1996.  OPERATION/PROCEDURE: Right carotid endarterectomy with Dacron patch angioplasty performed on June 09, 2000 by Dr. Madilyn Fireman.  HISTORY OF PRESENT ILLNESS: The patient is a 77 year old white male, a medical patient of Dr. Barry Dienes. Eloise Harman, with a history of type 2 diabetes and a remote history of bladder cancer, referred for right carotid bruit.  Doppler study showed a severe right ICA stenosis with contralateral occlusion and this placed him at high risk for potential cerebrovascular event, and Dr. Eloise Harman recommended the patient underwent right carotid endarterectomy.  The risks and benefits were discussed in detail and the patient was admitted electively for that at this time.  PAST MEDICAL HISTORY:  1. Diabetes mellitus, diet controlled.  2. Remote history of bladder cancer.  3. History of impotence.  4. Cataracts.  5. Bladder resection in 1996 with chemotherapy as follow-up.  ADMISSION MEDICATIONS:  1. Amaryl 1 mg q.d.  2. Aspirin 325 mg q.d.  For full details of the History and Physical  please see the dictated note.  HOSPITAL COURSE: The patient was admitted and underwent right carotid endarterectomy with Dacron patch angioplasty, and tolerated the procedure well.  He was returned to the recovery room and then to the medical ICU in satisfactory condition.  He was neurologically intact postoperatively and the first postoperative morning.  His Foley was removed and he was mobilized, and later in the day he was able to eat, drink, walk, and void without difficulty. He is to be discharged home.  DISCHARGE ACTIVITY: Light to moderate activity.  No lifting over ten pounds. No driving, no strenuous activity.  FOLLOW-UP: He will return in two weeks on Monday, June 19, 2000, at 9:20 a.m. to see Dr. Madilyn Fireman.  He is instructed to contact Dr. Eloise Harman for fu at his convenience.  DISCHARGE MEDICATIONS:  1. Continue preadmission Amaryl and aspirin as before.  2. Tylox 1-2 p.o. q.4h p.r.n.  Hematocrit postoperatively at 38.  Potassium was 3.9, creatinine 1.0.  Blood pressure was 110/60, heart rate 60, on low-dose dopamine.  Plans were to DC the dopamine, mobilize as noted above, and discharge later today.  DISCHARGE CONDITION: Improving. DD:  06/10/00 TD:  06/11/00 Job: [id number]

## 2010-12-31 NOTE — Op Note (Signed)
   NAME:  [name], [name]                            ACCOUNT NO.:  [id number]   MEDICAL RECORD NO.:  [id number]                   PATIENT TYPE:  AMB   LOCATION:  [facility]                                 FACILITY:  [facility]   PHYSICIAN:  Maretta Bees. Vonita Moss, M.D.             DATE OF BIRTH:  Dec 03, 1924   DATE OF PROCEDURE:  06/02/2003  DATE OF DISCHARGE:                                 OPERATIVE REPORT   PREOPERATIVE DIAGNOSIS:  Recurrent bladder carcinoma.   POSTOPERATIVE DIAGNOSIS:  Recurrent bladder carcinoma.   OPERATION/PROCEDURE:  1. Cystoscopy.  2. Bilateral retrograde pyelograms with interpretation.  3. Resection and fulguration of bladder carcinoma.   SURGEON:  Maretta Bees. Vonita Moss, M.D.   ANESTHESIA:  General.   INDICATIONS:  This 77 year old gentleman had a bladder tumor resected in  1996 and he has been free of recurrences until his recent cystoscopy which  revealed a papillary tumor in the trigone.  He is brought to the OR today  for further evaluation and treatment.   DESCRIPTION OF PROCEDURE:  The patient is brought to the operating room and  placed in the lithotomy position.  The external genitalia were prepped and  draped in the usual fashion.  He was cystoscoped.  The anterior urethra was  normal.  Prostate had just partial obstruction.  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  A cone-tip ureteral catheter was used to  perform bilateral retrograde pyelograms which showed delicate ureters with  no filling defects or obstruction.  Using the cold cup bladder biopsy  forceps, I was able to get representative biopsies from this tumor despite  the very acute angle on the trigone with the biopsy forceps.  Several good  specimens were obtained and then using the 70-degree lens and an Engineer, production, I was able to completely fulgurate and destroy any remaining tumor  including the surrounding  mucosa and at this point there was no blood loss  and good hemostasis.  The bladder was emptied.  The scope was removed and  Xylocaine jelly injected per urethra.  He was taken to the recovery room in  good condition having tolerated the procedure well.                                                Maretta Bees. Vonita Moss, M.D.   LJP/MEDQ  D:  06/02/2003  T:  06/02/2003  Job:  [id number]   cc:   Peter M. [place], M.D.  [address]  [place], [place] [address]  Fax: [phone]

## 2011-01-26 ENCOUNTER — Other Ambulatory Visit: Payer: Self-pay | Admitting: Cardiology

## 2011-01-26 NOTE — Telephone Encounter (Signed)
Med refill

## 2011-01-26 NOTE — Telephone Encounter (Signed)
Called pt to clarify dosage of Lanoxin. Pharmacy request received. Pt reports he takes Lanoxin 0.125 mg daily. Script sent to [facility]. Pt informed of sch

## 2011-03-22 ENCOUNTER — Other Ambulatory Visit: Payer: Self-pay | Admitting: *Deleted

## 2011-03-22 DIAGNOSIS — Z951 Presence of aortocoronary bypass graft: Secondary | ICD-10-CM

## 2011-04-14 ENCOUNTER — Encounter: Payer: Self-pay | Admitting: Cardiology

## 2011-04-26 ENCOUNTER — Other Ambulatory Visit (INDEPENDENT_AMBULATORY_CARE_PROVIDER_SITE_OTHER): Payer: Medicare Other | Admitting: *Deleted

## 2011-04-26 ENCOUNTER — Ambulatory Visit (INDEPENDENT_AMBULATORY_CARE_PROVIDER_SITE_OTHER): Payer: Medicare Other | Admitting: Cardiology

## 2011-04-26 ENCOUNTER — Encounter: Payer: Self-pay | Admitting: Cardiology

## 2011-04-26 DIAGNOSIS — N289 Disorder of kidney and ureter, unspecified: Secondary | ICD-10-CM

## 2011-04-26 DIAGNOSIS — I509 Heart failure, unspecified: Secondary | ICD-10-CM

## 2011-04-26 DIAGNOSIS — E785 Hyperlipidemia, unspecified: Secondary | ICD-10-CM

## 2011-04-26 DIAGNOSIS — I2510 Atherosclerotic heart disease of native coronary artery without angina pectoris: Secondary | ICD-10-CM

## 2011-04-26 DIAGNOSIS — I10 Essential (primary) hypertension: Secondary | ICD-10-CM

## 2011-04-26 DIAGNOSIS — Z951 Presence of aortocoronary bypass graft: Secondary | ICD-10-CM

## 2011-04-26 DIAGNOSIS — I4891 Unspecified atrial fibrillation: Secondary | ICD-10-CM

## 2011-04-26 NOTE — Progress Notes (Signed)
[name] Date of Birth: June 30, 1925   History of Present Illness: [name] is seen with his daughter today. He reports that he has not been feeling so well. He has had a lot of difficulty sleeping and has noticed a significant decrease in his energy level. He has been taking alprazolam at night for sleep. His daughter notes that he does take frequent cat naps during the day. He has some chronic sinus congestion. He denies any significant shortness of breath or increase in edema. We previously reduce his Lasix to once a day and he has rarely had to take an extra Lasix for increased weight gain. He denies orthopnea or PND. He's had no chest pain.  Current Outpatient Prescriptions on File Prior to Visit  Medication Sig Dispense Refill  . allopurinol (ZYLOPRIM) 300 MG tablet Take 300 mg by mouth daily.        [name] aspirin 81 MG tablet Take 81 mg by mouth daily.        [name] CALCIUM PO Take 500 mg by mouth 2 (two) times daily.       . COLCHICINE PO Take 0.6 mg by mouth daily as needed.       . digoxin (LANOXIN) 0.125 MG tablet TAKE 1 TABLET DAILY  90 tablet  2  . furosemide (LASIX) 40 MG tablet Take 40 mg by mouth 2 (two) times daily.        [name] lovastatin (MEVACOR) 20 MG tablet Take 20 mg by mouth at bedtime.        . metFORMIN (GLUCOPHAGE) 500 MG tablet Take 500 mg by mouth 2 (two) times daily with a meal.        . niacin (NIASPAN) 1000 MG CR tablet Take 1,000 mg by mouth at bedtime.        . Warfarin Sodium (COUMADIN PO) Take by mouth. Take as Directed         Allergies  Allergen Reactions  . Penicillins     Past Medical History  Diagnosis Date  . Atrial fibrillation     Chronic  . Coronary artery disease     Status post CABG. Hx of anterior apical infarct.  . Diabetes mellitus     Type 2  . Renal insufficiency     Chronic  . History of carotid endarterectomy     Status post right  . Hypertension   . Hyperlipidemia   . Gout   . CHF (congestive heart failure)     with chronic ischemic  cardiomypathy. EF of 10-15%.  CHF due to systolic dysfunction. Clinically doing well.    Past Surgical History  Procedure Date  . Coronary artery bypass graft   . Tonsillectomy   . Bladder tumor excision   . Nose surgery   . Rib fracture surgery     Left   . US echocardiography 04-17-2009    Est EF 10-15%  . Cardiovascular stress test 04-17-2009    EF 29%    History  Smoking status  . Former Smoker  Smokeless tobacco  . Not on file    History  Alcohol Use No    History reviewed. No pertinent family history.  Review of Systems: The review of systems is positive for fatigue and insomnia.  All other systems were reviewed and are negative.  Physical Exam: BP 108/80  Pulse 60  Ht 5\' 8"  (1.727 m)  Wt 124 lb 9.6 oz (56.518 kg)  BMI 18.95 kg/m2 He is an elderly white male  in no acute distress. He is normocephalic, atraumatic. Pupils are equal round and reactive. Sclera clear. Oropharynx is clear. Neck is supple without JVD, adenopathy, thyromegaly, or bruits. Lungs are clear. Cardiac exam reveals an irregular rate and rhythm with a grade 2/6 systolic murmur at apex. His rate is controlled. Abdomen is soft and nontender. He has no masses or bruits. Extremities are without edema. Pedal pulses are palpable. He is alert and oriented x3. Cranial nerves II through XII are intact. LABORATORY DATA:   Assessment / Plan:

## 2011-04-26 NOTE — Patient Instructions (Signed)
Continue your medications.  We will call with the results of your lab work today.  I will see you again in 6 months.

## 2011-04-26 NOTE — Assessment & Plan Note (Signed)
His rate is well controlled and he is on anticoagulation therapy.

## 2011-04-26 NOTE — Assessment & Plan Note (Signed)
We will followup on his renal function today on his diuretic therapy.

## 2011-04-26 NOTE — Assessment & Plan Note (Signed)
He appears to be well compensated today. He appears to be euvolemic. He is on appropriate medical therapy. I have encouraged him to increase his activity since this may help with his sleep patterns and his overall energy level.

## 2011-04-27 LAB — BASIC METABOLIC PANEL
BUN: 34 mg/dL — ABNORMAL HIGH (ref 6–23)
Calcium: 9.2 mg/dL (ref 8.4–10.5)
Creatinine, Ser: 1.7 mg/dL — ABNORMAL HIGH (ref 0.4–1.5)
GFR: 40.71 mL/min — ABNORMAL LOW (ref >60.00)
Glucose, Bld: 149 mg/dL — ABNORMAL HIGH (ref 70–99)

## 2011-04-27 LAB — BRAIN NATRIURETIC PEPTIDE: Pro B Natriuretic peptide (BNP): 1782 pg/mL — ABNORMAL HIGH (ref 0.0–100.0)

## 2011-04-28 ENCOUNTER — Telehealth: Payer: Self-pay | Admitting: *Deleted

## 2011-04-28 NOTE — Telephone Encounter (Signed)
Notified of lab results. Will send copy to Dr. Eloise Harman. States he doesn't take lasix 40 mg BID; if he does his gout flares up; advised him to take BID for couple of days then daily.

## 2011-04-28 NOTE — Telephone Encounter (Signed)
Message copied by Lorayne Bender on Thu Apr 28, 2011 11:16 AM ------      Message from: [place], PETER M      Created: Wed Apr 27, 2011 12:31 PM       Potassium is mildly elevated. Creatnine is stable. BNP is elevated. No baseline for comparison. Appeared euvolemic. Avoid high potassium foods.

## 2011-05-09 ENCOUNTER — Telehealth: Payer: Self-pay | Admitting: Cardiology

## 2011-05-09 ENCOUNTER — Encounter: Payer: Medicare Other | Admitting: *Deleted

## 2011-05-09 NOTE — Telephone Encounter (Signed)
Called stating he cancelled his device clinic app because he has been throwing up blood, not feeling well;no energy ,gout,pain in knees. Advised pt to go to ER or see his PCP. When spoke w/him he said he got an app w/Dr. Eloise Harman this afternoon.

## 2011-05-09 NOTE — Telephone Encounter (Signed)
Pt wife calling to cancel pt device check stating that pt is ill and throwing up blood and it is coming out of his nose. Pt barely has energy to put pants on. Pt has had no energy for a few weeks. Pt panting, gout, pain in knees. Pt wife doesn't know if pt needs to go to ED. Please return pt wife call to discuss further.

## 2011-06-29 ENCOUNTER — Encounter: Payer: Self-pay | Admitting: Internal Medicine

## 2011-06-29 ENCOUNTER — Ambulatory Visit (INDEPENDENT_AMBULATORY_CARE_PROVIDER_SITE_OTHER): Payer: Medicare Other | Admitting: *Deleted

## 2011-06-29 DIAGNOSIS — I509 Heart failure, unspecified: Secondary | ICD-10-CM

## 2011-06-29 DIAGNOSIS — I428 Other cardiomyopathies: Secondary | ICD-10-CM

## 2011-06-29 LAB — ICD DEVICE OBSERVATION
AL IMPEDENCE ICD: 560 Ohm
BAMS-0001: 170 {beats}/min
BRDY-0003RV: 130 {beats}/min
CHARGE TIME: 11.321 s
PACEART VT: 0
RV LEAD AMPLITUDE: 6.5408 mv
RV LEAD IMPEDENCE ICD: 324 Ohm
TOT-0001: 1
TOT-0002: 0
TOT-0006: 20090821000000
TZAT-0001FASTVT: 1
TZAT-0001SLOWVT: 1
TZAT-0002ATACH: NEGATIVE
TZAT-0002FASTVT: NEGATIVE
TZAT-0012ATACH: 150 ms
TZAT-0012ATACH: 150 ms
TZAT-0012FASTVT: 200 ms
TZAT-0012SLOWVT: 200 ms
TZAT-0018FASTVT: NEGATIVE
TZAT-0019ATACH: 6 V
TZAT-0020ATACH: 1.5 ms
TZAT-0020ATACH: 1.5 ms
TZAT-0020ATACH: 1.5 ms
TZON-0003ATACH: 350 ms
TZON-0003VSLOWVT: 370 ms
TZST-0001ATACH: 4
TZST-0001FASTVT: 2
TZST-0001FASTVT: 4
TZST-0001FASTVT: 6
TZST-0001SLOWVT: 4
TZST-0001SLOWVT: 5
TZST-0002ATACH: NEGATIVE
TZST-0002FASTVT: NEGATIVE
TZST-0002FASTVT: NEGATIVE
TZST-0002SLOWVT: NEGATIVE
TZST-0002SLOWVT: NEGATIVE

## 2011-08-26 ENCOUNTER — Other Ambulatory Visit (HOSPITAL_COMMUNITY): Payer: Self-pay | Admitting: Internal Medicine

## 2011-08-26 DIAGNOSIS — J90 Pleural effusion, not elsewhere classified: Secondary | ICD-10-CM

## 2011-08-29 ENCOUNTER — Other Ambulatory Visit (HOSPITAL_COMMUNITY): Payer: Self-pay | Admitting: Internal Medicine

## 2011-08-29 ENCOUNTER — Ambulatory Visit (HOSPITAL_COMMUNITY)
Admission: RE | Admit: 2011-08-29 | Discharge: 2011-08-29 | Disposition: A | Discharge status: Home / Self Care | Payer: Medicare Other | Source: Ambulatory Visit | Attending: Physician Assistant | Admitting: Physician Assistant

## 2011-08-29 ENCOUNTER — Ambulatory Visit (HOSPITAL_COMMUNITY)
Admission: RE | Admit: 2011-08-29 | Discharge: 2011-08-29 | Disposition: A | Discharge status: Home / Self Care | Payer: Medicare Other | Source: Ambulatory Visit | Attending: Internal Medicine | Admitting: Internal Medicine

## 2011-08-29 DIAGNOSIS — I509 Heart failure, unspecified: Secondary | ICD-10-CM | POA: Insufficient documentation

## 2011-08-29 DIAGNOSIS — J90 Pleural effusion, not elsewhere classified: Secondary | ICD-10-CM | POA: Insufficient documentation

## 2011-08-29 NOTE — Procedures (Signed)
Procedure : right thoracentesis Specimen : 1.4 L clear serous fluid Complications : none immediate   Post CXR pending

## 2011-09-30 ENCOUNTER — Other Ambulatory Visit: Payer: Self-pay | Admitting: Cardiology

## 2011-10-11 ENCOUNTER — Encounter: Payer: Self-pay | Admitting: Internal Medicine

## 2011-10-11 ENCOUNTER — Ambulatory Visit (INDEPENDENT_AMBULATORY_CARE_PROVIDER_SITE_OTHER): Payer: Medicare Other | Admitting: Internal Medicine

## 2011-10-11 DIAGNOSIS — I2589 Other forms of chronic ischemic heart disease: Secondary | ICD-10-CM

## 2011-10-11 DIAGNOSIS — Z95810 Presence of automatic (implantable) cardiac defibrillator: Secondary | ICD-10-CM

## 2011-10-11 DIAGNOSIS — I509 Heart failure, unspecified: Secondary | ICD-10-CM

## 2011-10-11 DIAGNOSIS — I4891 Unspecified atrial fibrillation: Secondary | ICD-10-CM

## 2011-10-11 DIAGNOSIS — Z950 Presence of cardiac pacemaker: Secondary | ICD-10-CM | POA: Insufficient documentation

## 2011-10-11 DIAGNOSIS — I2510 Atherosclerotic heart disease of native coronary artery without angina pectoris: Secondary | ICD-10-CM

## 2011-10-11 LAB — ICD DEVICE OBSERVATION
ATRIAL PACING ICD: 0.22 pct
BRDY-0002RV: 55 {beats}/min
BRDY-0003RV: 130 {beats}/min
BRDY-0004RV: 130 {beats}/min
DEV-0020ICD: NEGATIVE
FVT: 0
PACEART VT: 0
RV LEAD IMPEDENCE ICD: 384 Ohm
TOT-0001: 1
TZAT-0001ATACH: 2
TZAT-0001FASTVT: 1
TZAT-0001SLOWVT: 1
TZAT-0002ATACH: NEGATIVE
TZAT-0002FASTVT: NEGATIVE
TZAT-0012ATACH: 150 ms
TZAT-0012ATACH: 150 ms
TZAT-0012FASTVT: 200 ms
TZAT-0012SLOWVT: 200 ms
TZAT-0018ATACH: NEGATIVE
TZAT-0018FASTVT: NEGATIVE
TZAT-0019ATACH: 6 V
TZAT-0019ATACH: 6 V
TZAT-0020ATACH: 1.5 ms
TZAT-0020ATACH: 1.5 ms
TZAT-0020ATACH: 1.5 ms
TZAT-0020SLOWVT: 1.5 ms
TZON-0003ATACH: 350 ms
TZON-0003VSLOWVT: 370 ms
TZON-0004SLOWVT: 16
TZON-0004VSLOWVT: 28
TZST-0001ATACH: 4
TZST-0001FASTVT: 4
TZST-0001FASTVT: 6
TZST-0001SLOWVT: 2
TZST-0001SLOWVT: 3
TZST-0001SLOWVT: 4
TZST-0002ATACH: NEGATIVE
TZST-0002FASTVT: NEGATIVE
TZST-0002FASTVT: NEGATIVE
TZST-0002FASTVT: NEGATIVE
TZST-0002SLOWVT: NEGATIVE
TZST-0002SLOWVT: NEGATIVE
VENTRICULAR PACING ICD: 70.39 pct

## 2011-10-11 NOTE — Progress Notes (Signed)
HPI  [name] is a 78 y.o. male seen in followup for an ICD implanted for primary   prevention of ischemic heart disease with last echo 2010 with EF 15-20%  He has permanent atrial  fibrillation (relatively) having gone into sinus rhythm at the time of his ICD.  He maintained sinus rhythm for a couple of months.  He noted   no change in his symptoms in the interval in 4 months.  He continues to have paroxysms and persistence of atrial arrhythmias having been out of rhythm now for the last 3 or 4 months.  He was recently submitted to thoracentesis. I don't see laboratory results from that  He complains of fatigue and exercise intolerance. He denies chest pain or significant edema     Past Medical History  Diagnosis Date  . Atrial fibrillation     Chronic  . Coronary artery disease     Status post CABG. Hx of anterior apical infarct.  . Diabetes mellitus     Type 2  . Renal insufficiency     Chronic  . History of carotid endarterectomy     Status post right  . Hypertension   . Hyperlipidemia   . Gout   . CHF (congestive heart failure)     with chronic ischemic cardiomypathy. EF of 10-15%.  CHF due to systolic dysfunction. Clinically doing well.    Past Surgical History  Procedure Date  . Coronary artery bypass graft   . Tonsillectomy   . Bladder tumor excision   . Nose surgery   . Rib fracture surgery     Left   . US echocardiography 04-17-2009    Est EF 10-15%  . Cardiovascular stress test 04-17-2009    EF 29%    Current Outpatient Prescriptions  Medication Sig Dispense Refill  . allopurinol (ZYLOPRIM) 300 MG tablet Take 300 mg by mouth daily.        [name] ALPRAZolam (XANAX) 0.5 MG tablet at bedtime as needed.      [name] aspirin 81 MG tablet Take 81 mg by mouth daily.        [name] CALCIUM PO Take 500 mg by mouth 2 (two) times daily.       . COLCHICINE PO Take 0.6 mg by mouth daily as needed.       . digoxin (LANOXIN) 0.125 MG tablet TAKE 1 TABLET DAILY  90 tablet  2  .  furosemide (LASIX) 40 MG tablet Take 40 mg by mouth 2 (two) times daily.       [name] HYDROcodone-acetaminophen (VICODIN) 5-500 MG per tablet       . lisinopril (PRINIVIL,ZESTRIL) 5 MG tablet Take 5 mg by mouth daily.       [name] lovastatin (MEVACOR) 20 MG tablet Take 20 mg by mouth at bedtime.        . multivitamin-lutein (OCUVITE-LUTEIN) CAPS Take 1 capsule by mouth daily.      . niacin (NIASPAN) 1000 MG CR tablet Take 1,000 mg by mouth at bedtime.        . Warfarin Sodium (COUMADIN PO) Take by mouth. Take as Directed         Allergies  Allergen Reactions  . Penicillins     Review of Systems negative except from HPI and PMH  Physical Exam Pulse 81  Ht 5\' 8"  (1.727 m)  Wt 121 lb 12.8 oz (55.248 kg)  BMI 18.52 kg/m2 Well developed and well nourished in no acute distress HENT normal E scleral  and icterus clear Neck Supple JVP GREATER than 10 cm  2Clear to ausculation Regular rate and rhythm, a 2/6 murmur at the base  2Soft with active bowel sounds No clubbing cyanosis Trace Edema Alert and oriented, grossly normal motor and sensory function Skin Warm and Dry   Assessment and  Plan

## 2011-10-11 NOTE — Assessment & Plan Note (Signed)
The patient's device was interrogated.  The information was reviewed. No changes were made in the programming.    

## 2011-10-11 NOTE — Assessment & Plan Note (Signed)
No overt symptoms  ? myoveiw  Defer to D  [place]

## 2011-10-11 NOTE — Assessment & Plan Note (Addendum)
This is intermittently persisted; however, there has been no significant improvement noted in the past with a maintaining a sinus rhythm. I would not pursue that at this time  When he got sick this winter his INRs became quite labile. Event that happens again, it might well be worth considering an alternative agent if his renal function would tolerate

## 2011-10-11 NOTE — Assessment & Plan Note (Signed)
I'm impressed by the evidence of right-sided volume overload. Apparently were 3 looking at his ultrasound and will arrange for this on the day of his appointment with Dr. [place]and.

## 2011-10-11 NOTE — Patient Instructions (Addendum)
Keep your follow up appt with Dr. [place].  Remote monitoring is used to monitor your Pacemaker of ICD from home. This monitoring reduces the number of office visits required to check your device to one time per year. It allows [place] to keep an eye on the functioning of your device to ensure it is working properly. You are scheduled for a device check from home on 01/12/12. You may send your transmission at any time that day. If you have a wireless device, the transmission will be sent automatically. After your physician reviews your transmission, you will receive a postcard with your next transmission date.  Your physician has requested that you have an echocardiogram on March 20th prior to appointment with Dr. [place]. Echocardiography is a painless test that uses sound waves to create images of your heart. It provides your doctor with information about the size and shape of your heart and how well your heart's chambers and valves are working. This procedure takes approximately one hour. There are no restrictions for this procedure.

## 2011-10-15 ENCOUNTER — Other Ambulatory Visit: Payer: Self-pay | Admitting: Cardiology

## 2011-11-02 ENCOUNTER — Other Ambulatory Visit (HOSPITAL_COMMUNITY): Payer: Medicare Other

## 2011-11-02 ENCOUNTER — Ambulatory Visit: Payer: Medicare Other | Admitting: Cardiology

## 2011-12-05 ENCOUNTER — Ambulatory Visit (HOSPITAL_COMMUNITY): Payer: Medicare Other | Attending: Internal Medicine

## 2011-12-05 ENCOUNTER — Ambulatory Visit (INDEPENDENT_AMBULATORY_CARE_PROVIDER_SITE_OTHER): Payer: Medicare Other | Admitting: Cardiology

## 2011-12-05 ENCOUNTER — Encounter: Payer: Self-pay | Admitting: Cardiology

## 2011-12-05 VITALS — BP 124/60 | HR 60 | Ht 68.0 in | Wt 124.0 lb

## 2011-12-05 DIAGNOSIS — I359 Nonrheumatic aortic valve disorder, unspecified: Secondary | ICD-10-CM | POA: Insufficient documentation

## 2011-12-05 DIAGNOSIS — I2510 Atherosclerotic heart disease of native coronary artery without angina pectoris: Secondary | ICD-10-CM | POA: Insufficient documentation

## 2011-12-05 DIAGNOSIS — I2589 Other forms of chronic ischemic heart disease: Secondary | ICD-10-CM | POA: Insufficient documentation

## 2011-12-05 DIAGNOSIS — N289 Disorder of kidney and ureter, unspecified: Secondary | ICD-10-CM | POA: Insufficient documentation

## 2011-12-05 DIAGNOSIS — E119 Type 2 diabetes mellitus without complications: Secondary | ICD-10-CM | POA: Insufficient documentation

## 2011-12-05 DIAGNOSIS — I509 Heart failure, unspecified: Secondary | ICD-10-CM

## 2011-12-05 DIAGNOSIS — I059 Rheumatic mitral valve disease, unspecified: Secondary | ICD-10-CM | POA: Insufficient documentation

## 2011-12-05 DIAGNOSIS — I4891 Unspecified atrial fibrillation: Secondary | ICD-10-CM | POA: Insufficient documentation

## 2011-12-05 DIAGNOSIS — I679 Cerebrovascular disease, unspecified: Secondary | ICD-10-CM | POA: Insufficient documentation

## 2011-12-05 DIAGNOSIS — Z7901 Long term (current) use of anticoagulants: Secondary | ICD-10-CM

## 2011-12-05 DIAGNOSIS — R011 Cardiac murmur, unspecified: Secondary | ICD-10-CM | POA: Insufficient documentation

## 2011-12-05 DIAGNOSIS — I517 Cardiomegaly: Secondary | ICD-10-CM | POA: Insufficient documentation

## 2011-12-05 DIAGNOSIS — I10 Essential (primary) hypertension: Secondary | ICD-10-CM | POA: Insufficient documentation

## 2011-12-05 NOTE — Assessment & Plan Note (Signed)
Patient did have acute congestive heart failure in January of this year with associated right pleural effusion. This bonded well to increase in his Lasix back to twice a day. I stressed the importance of sodium restriction particularly with processed foods. He'll continue his Lasix twice daily.

## 2011-12-05 NOTE — Patient Instructions (Signed)
Continue your current medication.  Avoid salt especially in processed foods and pre prepared dinners.  I will see you again in 6 months.

## 2011-12-05 NOTE — Assessment & Plan Note (Signed)
Atrial fibrillation rate is well controlled and he is paced. He will continue with digoxin and Coumadin.

## 2011-12-05 NOTE — Progress Notes (Signed)
[name] Date of Birth: October 20, 1924   History of Present Illness: [name] is seen today for followup. He reports that in January of this year he had increasing shortness of breath. A chest x-ray demonstrated a large right pleural effusion. He underwent thoracentesis with removal of 1-1/2 L of serous fluid. This occurred when he had reduced his Lasix to only once a day. He has been taking it twice a day since then and has done very well without any recurrent shortness of breath. His major complaint is that he itches all over and can't sleep well. He denies any chest pain or palpitations. He's had no ICD shocks.  Current Outpatient Prescriptions on File Prior to Visit  Medication Sig Dispense Refill  . allopurinol (ZYLOPRIM) 300 MG tablet Take 300 mg by mouth daily.        [name] ALPRAZolam (XANAX) 0.5 MG tablet at bedtime as needed.      [name] aspirin 81 MG tablet Take 81 mg by mouth daily.        [name] CALCIUM PO Take 500 mg by mouth 2 (two) times daily.       . COLCHICINE PO Take 0.6 mg by mouth daily as needed.       . digoxin (LANOXIN) 0.125 MG tablet TAKE 1 TABLET DAILY  90 tablet  2  . furosemide (LASIX) 40 MG tablet TAKE 1 TABLET TWICE A DAY  180 tablet  2  . lisinopril (PRINIVIL,ZESTRIL) 5 MG tablet Take 5 mg by mouth daily.       [name] lovastatin (MEVACOR) 20 MG tablet Take 20 mg by mouth at bedtime.        . multivitamin-lutein (OCUVITE-LUTEIN) CAPS Take 1 capsule by mouth daily.      . Warfarin Sodium (COUMADIN PO) Take by mouth. Take as Directed         Allergies  Allergen Reactions  . Penicillins     Past Medical History  Diagnosis Date  . Atrial fibrillation     Chronic  . Coronary artery disease     Status post CABG. Hx of anterior apical infarct.  . Diabetes mellitus     Type 2  . Renal insufficiency     Chronic  . History of carotid endarterectomy     Status post right  . Hypertension   . Hyperlipidemia   . Gout   . CHF (congestive heart failure)     with chronic ischemic  cardiomypathy. EF of 10-15%.  CHF due to systolic dysfunction. Clinically doing well.    Past Surgical History  Procedure Date  . Coronary artery bypass graft   . Tonsillectomy   . Bladder tumor excision   . Nose surgery   . Rib fracture surgery     Left   . US echocardiography 04-17-2009    Est EF 10-15%  . Cardiovascular stress test 04-17-2009    EF 29%    History  Smoking status  . Former Smoker  Smokeless tobacco  . Not on file    History  Alcohol Use No    History reviewed. No pertinent family history.  Review of Systems: The review of systems is positive for pruritus and insomnia.  All other systems were reviewed and are negative.  Physical Exam: BP 124/60  Pulse 60  Ht 5\' 8"  (1.727 m)  Wt 124 lb (56.246 kg)  BMI 18.85 kg/m2 He is an elderly white male in no acute distress. He is normocephalic, atraumatic. Pupils are equal round  and reactive. Sclera clear. Oropharynx is clear. Neck is supple without JVD, adenopathy, thyromegaly, or bruits. Lungs are clear. Cardiac exam reveals a regular rate and rhythm with a grade 2/6 systolic murmur at apex. His rate is controlled. Abdomen is soft and nontender. He has no masses or bruits. Extremities are without edema. Pedal pulses are palpable. He is alert and oriented x3. Cranial nerves II through XII are intact. LABORATORY DATA: ECG today demonstrates atrial fibrillation with a paced ventricular rhythm at a rate of 62 beats per minute.  Assessment / Plan:

## 2011-12-05 NOTE — Assessment & Plan Note (Signed)
He remains asymptomatic from a cardiac standpoint. His last nuclear stress test in September of 2010 showed a fixed anterior lateral and apical defect with ejection fraction 29%. There was no ischemia. We will continue with his medical therapy including aspirin and statin therapy.

## 2012-01-12 ENCOUNTER — Encounter: Payer: Medicare Other | Admitting: *Deleted

## 2012-01-18 ENCOUNTER — Encounter: Payer: Self-pay | Admitting: *Deleted

## 2012-01-23 ENCOUNTER — Encounter: Payer: Self-pay | Admitting: Internal Medicine

## 2012-01-23 ENCOUNTER — Telehealth: Payer: Self-pay | Admitting: Cardiology

## 2012-01-23 ENCOUNTER — Ambulatory Visit (INDEPENDENT_AMBULATORY_CARE_PROVIDER_SITE_OTHER): Payer: Medicare Other | Admitting: *Deleted

## 2012-01-23 DIAGNOSIS — I509 Heart failure, unspecified: Secondary | ICD-10-CM

## 2012-01-23 DIAGNOSIS — Z95810 Presence of automatic (implantable) cardiac defibrillator: Secondary | ICD-10-CM

## 2012-01-23 NOTE — Telephone Encounter (Signed)
Patient was out of town when transmission was due, he will send manuel one tonight.

## 2012-01-23 NOTE — Telephone Encounter (Signed)
New msg Pt wants to talk to you about his transmission. He said he was out of town when it was done.

## 2012-01-24 LAB — REMOTE ICD DEVICE
AL IMPEDENCE ICD: 544 Ohm
BAMS-0001: 170 {beats}/min
BATTERY VOLTAGE: 2.97 V
FVT: 0
TOT-0006: 20090821000000
TZAT-0001ATACH: 1
TZAT-0001ATACH: 2
TZAT-0001ATACH: 3
TZAT-0001FASTVT: 1
TZAT-0001SLOWVT: 1
TZAT-0002SLOWVT: NEGATIVE
TZAT-0012ATACH: 150 ms
TZAT-0012ATACH: 150 ms
TZAT-0012ATACH: 150 ms
TZAT-0012SLOWVT: 200 ms
TZAT-0018ATACH: NEGATIVE
TZAT-0018ATACH: NEGATIVE
TZAT-0018SLOWVT: NEGATIVE
TZAT-0020ATACH: 1.5 ms
TZAT-0020ATACH: 1.5 ms
TZAT-0020ATACH: 1.5 ms
TZAT-0020FASTVT: 1.5 ms
TZON-0003ATACH: 350 ms
TZON-0003SLOWVT: 400 ms
TZST-0001ATACH: 4
TZST-0001ATACH: 6
TZST-0001FASTVT: 2
TZST-0001FASTVT: 3
TZST-0001FASTVT: 4
TZST-0001SLOWVT: 4
TZST-0001SLOWVT: 6
TZST-0002ATACH: NEGATIVE
TZST-0002FASTVT: NEGATIVE
TZST-0002SLOWVT: NEGATIVE
TZST-0002SLOWVT: NEGATIVE
TZST-0002SLOWVT: NEGATIVE
VENTRICULAR PACING ICD: 91.24 pct
VF: 0

## 2012-02-01 ENCOUNTER — Encounter: Payer: Self-pay | Admitting: *Deleted

## 2012-04-30 ENCOUNTER — Ambulatory Visit (INDEPENDENT_AMBULATORY_CARE_PROVIDER_SITE_OTHER): Payer: Medicare Other | Admitting: *Deleted

## 2012-04-30 ENCOUNTER — Encounter: Payer: Self-pay | Admitting: Internal Medicine

## 2012-04-30 DIAGNOSIS — I509 Heart failure, unspecified: Secondary | ICD-10-CM

## 2012-04-30 DIAGNOSIS — I428 Other cardiomyopathies: Secondary | ICD-10-CM

## 2012-05-04 LAB — REMOTE ICD DEVICE
AL IMPEDENCE ICD: 504 Ohm
ATRIAL PACING ICD: 0.21 pct
BAMS-0001: 170 {beats}/min
BATTERY VOLTAGE: 2.92 V
BRDY-0002RV: 55 {beats}/min
BRDY-0003RV: 130 {beats}/min
BRDY-0004RV: 130 {beats}/min
CHARGE TIME: 12.091 s
DEV-0020ICD: NEGATIVE
FVT: 0
PACEART VT: 0
RV LEAD AMPLITUDE: 4.3 mv
RV LEAD IMPEDENCE ICD: 316 Ohm
TOT-0001: 1
TOT-0002: 0
TOT-0006: 20090821000000
TZAT-0001ATACH: 1
TZAT-0001ATACH: 2
TZAT-0001ATACH: 3
TZAT-0001FASTVT: 1
TZAT-0001SLOWVT: 1
TZAT-0002ATACH: NEGATIVE
TZAT-0002ATACH: NEGATIVE
TZAT-0002ATACH: NEGATIVE
TZAT-0002FASTVT: NEGATIVE
TZAT-0002SLOWVT: NEGATIVE
TZAT-0012ATACH: 150 ms
TZAT-0012ATACH: 150 ms
TZAT-0012ATACH: 150 ms
TZAT-0012FASTVT: 200 ms
TZAT-0012SLOWVT: 200 ms
TZAT-0018ATACH: NEGATIVE
TZAT-0018ATACH: NEGATIVE
TZAT-0018ATACH: NEGATIVE
TZAT-0018FASTVT: NEGATIVE
TZAT-0018SLOWVT: NEGATIVE
TZAT-0019ATACH: 6 V
TZAT-0019ATACH: 6 V
TZAT-0019ATACH: 6 V
TZAT-0019FASTVT: 8 V
TZAT-0019SLOWVT: 8 V
TZAT-0020ATACH: 1.5 ms
TZAT-0020ATACH: 1.5 ms
TZAT-0020ATACH: 1.5 ms
TZAT-0020FASTVT: 1.5 ms
TZAT-0020SLOWVT: 1.5 ms
TZON-0003ATACH: 350 ms
TZON-0003SLOWVT: 400 ms
TZON-0003VSLOWVT: 370 ms
TZON-0004SLOWVT: 16
TZON-0004VSLOWVT: 28
TZON-0005SLOWVT: 12
TZST-0001ATACH: 4
TZST-0001ATACH: 5
TZST-0001ATACH: 6
TZST-0001FASTVT: 2
TZST-0001FASTVT: 3
TZST-0001FASTVT: 4
TZST-0001FASTVT: 5
TZST-0001FASTVT: 6
TZST-0001SLOWVT: 2
TZST-0001SLOWVT: 3
TZST-0001SLOWVT: 4
TZST-0001SLOWVT: 5
TZST-0001SLOWVT: 6
TZST-0002ATACH: NEGATIVE
TZST-0002ATACH: NEGATIVE
TZST-0002ATACH: NEGATIVE
TZST-0002FASTVT: NEGATIVE
TZST-0002FASTVT: NEGATIVE
TZST-0002FASTVT: NEGATIVE
TZST-0002FASTVT: NEGATIVE
TZST-0002FASTVT: NEGATIVE
TZST-0002SLOWVT: NEGATIVE
TZST-0002SLOWVT: NEGATIVE
TZST-0002SLOWVT: NEGATIVE
TZST-0002SLOWVT: NEGATIVE
TZST-0002SLOWVT: NEGATIVE
VENTRICULAR PACING ICD: 93.22 pct
VF: 0

## 2012-05-23 ENCOUNTER — Encounter: Payer: Self-pay | Admitting: *Deleted

## 2012-06-05 ENCOUNTER — Encounter: Payer: Self-pay | Admitting: Cardiology

## 2012-06-05 ENCOUNTER — Ambulatory Visit (INDEPENDENT_AMBULATORY_CARE_PROVIDER_SITE_OTHER): Payer: Medicare Other | Admitting: Cardiology

## 2012-06-05 VITALS — BP 128/68 | HR 66 | Ht 68.0 in | Wt 121.8 lb

## 2012-06-05 DIAGNOSIS — I2510 Atherosclerotic heart disease of native coronary artery without angina pectoris: Secondary | ICD-10-CM

## 2012-06-05 DIAGNOSIS — I509 Heart failure, unspecified: Secondary | ICD-10-CM

## 2012-06-05 DIAGNOSIS — I10 Essential (primary) hypertension: Secondary | ICD-10-CM

## 2012-06-05 DIAGNOSIS — I4891 Unspecified atrial fibrillation: Secondary | ICD-10-CM

## 2012-06-05 NOTE — Progress Notes (Signed)
[name] Date of Birth: 1924-12-18   History of Present Illness: [name] is seen today for followup. He has a number of complaints today. He states he has been losing weight. His primary complaint is of lack of energy and strength. He has been waking up in the morning with pain and weakness in the muscles in the back of his neck. He feels lightheaded a lot. He states it feels like he is in the dome. This is non-orthostatic. He has had no defibrillator discharges. He reports his breathing is actually doing quite well since he had a thoracentesis back in January. He had an ICD check in September which was normal. He denies any chest pain.  Current Outpatient Prescriptions on File Prior to Visit  Medication Sig Dispense Refill  . allopurinol (ZYLOPRIM) 300 MG tablet Take 300 mg by mouth daily.        [name] ALPRAZolam (XANAX) 0.5 MG tablet at bedtime as needed.      [name] aspirin 81 MG tablet Take 81 mg by mouth daily.        [name] CALCIUM PO Take 500 mg by mouth 2 (two) times daily.       . COLCHICINE PO Take 0.6 mg by mouth daily as needed.       . digoxin (LANOXIN) 0.125 MG tablet TAKE 1 TABLET DAILY  90 tablet  2  . furosemide (LASIX) 40 MG tablet TAKE 1 TABLET TWICE A DAY  180 tablet  2  . lisinopril (PRINIVIL,ZESTRIL) 5 MG tablet Take 5 mg by mouth daily.       [name] lovastatin (MEVACOR) 20 MG tablet Take 20 mg by mouth at bedtime.        . multivitamin-lutein (OCUVITE-LUTEIN) CAPS Take 1 capsule by mouth daily.      . Warfarin Sodium (COUMADIN PO) Take by mouth. Take as Directed         Allergies  Allergen Reactions  . Penicillins     Past Medical History  Diagnosis Date  . Atrial fibrillation     Chronic  . Coronary artery disease     Status post CABG. Hx of anterior apical infarct.  . Diabetes mellitus     Type 2  . Renal insufficiency     Chronic  . History of carotid endarterectomy     Status post right  . Hypertension   . Hyperlipidemia   . Gout   . CHF (congestive heart failure)      with chronic ischemic cardiomypathy. EF of 10-15%.  CHF due to systolic dysfunction. Clinically doing well.    Past Surgical History  Procedure Date  . Coronary artery bypass graft   . Tonsillectomy   . Bladder tumor excision   . Nose surgery   . Rib fracture surgery     Left   . US echocardiography 04-17-2009    Est EF 10-15%  . Cardiovascular stress test 04-17-2009    EF 29%    History  Smoking status  . Former Smoker  Smokeless tobacco  . Not on file    History  Alcohol Use No    History reviewed. No pertinent family history.  Review of Systems: The review of systems is positive for insomnia and sinus congestion. He has some minor palpitations at times. All other systems were reviewed and are negative.  Physical Exam: BP 128/68  Pulse 66  Ht 5\' 8"  (1.727 m)  Wt 121 lb 12.8 oz (55.248 kg)  BMI 18.52 kg/m2  SpO2 99% He is an elderly white male in no acute distress. He is normocephalic, atraumatic. Pupils are equal round and reactive. Sclera clear. Oropharynx is clear. Neck is supple without adenopathy or thyromegaly. He has mild JVD. He has a very soft left carotid bruit. He has an old right CEA scar without bruit. Lungs are clear. Cardiac exam reveals a regular rate and rhythm with a grade 2/6 systolic murmur at apex. His rate is controlled. Abdomen is soft and nontender. He has no masses or bruits. Extremities are without edema. Pedal pulses are palpable. He is alert and oriented x3. Cranial nerves II through XII are intact. LABORATORY DATA:   Assessment / Plan: 1. Chronic systolic congestive heart failure. This is secondary to an ischemic cardiomyopathy. Ejection fraction is 10-15%. He appears to be well compensated today on Lasix 40 mg twice a day. He is also on Lanoxin and lisinopril. We will continue with his current therapy and sodium restriction. Undoubtedly his symptoms of fatigue are related to his low cardiac output.  2. Coronary disease status post CABG.  He has no clinical symptoms of angina.  3. Permanent atrial fibrillation. Rate is well controlled. Continue long-term anticoagulation with Coumadin.  4. Prophylactic ICD.  5. Diabetes mellitus type 2.  6. Hyperlipidemia, on lovastatin.

## 2012-06-05 NOTE — Patient Instructions (Signed)
Continue your current medication.  Avoid salt.  I will see you again in 6 months.   

## 2012-06-06 ENCOUNTER — Other Ambulatory Visit: Payer: Self-pay | Admitting: Cardiology

## 2012-06-20 ENCOUNTER — Other Ambulatory Visit: Payer: Self-pay | Admitting: Cardiology

## 2012-07-30 ENCOUNTER — Ambulatory Visit (INDEPENDENT_AMBULATORY_CARE_PROVIDER_SITE_OTHER): Payer: Medicare Other | Admitting: *Deleted

## 2012-07-30 ENCOUNTER — Encounter: Payer: Self-pay | Admitting: Internal Medicine

## 2012-07-30 DIAGNOSIS — I509 Heart failure, unspecified: Secondary | ICD-10-CM

## 2012-07-30 DIAGNOSIS — Z95810 Presence of automatic (implantable) cardiac defibrillator: Secondary | ICD-10-CM

## 2012-08-12 LAB — REMOTE ICD DEVICE
ATRIAL PACING ICD: 0.2 pct
BRDY-0003RV: 130 {beats}/min
BRDY-0004RV: 130 {beats}/min
FVT: 0
RV LEAD AMPLITUDE: 2.6 mv
RV LEAD IMPEDENCE ICD: 308 Ohm
TZAT-0001ATACH: 1
TZAT-0001ATACH: 2
TZAT-0001FASTVT: 1
TZAT-0001SLOWVT: 1
TZAT-0002ATACH: NEGATIVE
TZAT-0002ATACH: NEGATIVE
TZAT-0002FASTVT: NEGATIVE
TZAT-0002SLOWVT: NEGATIVE
TZAT-0012ATACH: 150 ms
TZAT-0018ATACH: NEGATIVE
TZAT-0018ATACH: NEGATIVE
TZAT-0018ATACH: NEGATIVE
TZAT-0019ATACH: 6 V
TZAT-0020ATACH: 1.5 ms
TZON-0003ATACH: 350 ms
TZON-0003VSLOWVT: 370 ms
TZON-0004VSLOWVT: 28
TZON-0005SLOWVT: 12
TZST-0001ATACH: 6
TZST-0001FASTVT: 3
TZST-0001FASTVT: 4
TZST-0001FASTVT: 5
TZST-0001SLOWVT: 2
TZST-0001SLOWVT: 3
TZST-0001SLOWVT: 4
TZST-0002ATACH: NEGATIVE
TZST-0002ATACH: NEGATIVE
TZST-0002FASTVT: NEGATIVE
TZST-0002FASTVT: NEGATIVE
TZST-0002SLOWVT: NEGATIVE
TZST-0002SLOWVT: NEGATIVE
TZST-0002SLOWVT: NEGATIVE
VF: 0

## 2012-09-18 ENCOUNTER — Ambulatory Visit (INDEPENDENT_AMBULATORY_CARE_PROVIDER_SITE_OTHER): Payer: Medicare Other | Admitting: Internal Medicine

## 2012-09-18 ENCOUNTER — Encounter: Payer: Self-pay | Admitting: Internal Medicine

## 2012-09-18 VITALS — BP 119/50 | HR 68 | Ht 68.0 in | Wt 122.0 lb

## 2012-09-18 DIAGNOSIS — F329 Major depressive disorder, single episode, unspecified: Secondary | ICD-10-CM

## 2012-09-18 DIAGNOSIS — T82198A Other mechanical complication of other cardiac electronic device, initial encounter: Secondary | ICD-10-CM

## 2012-09-18 DIAGNOSIS — I509 Heart failure, unspecified: Secondary | ICD-10-CM

## 2012-09-18 DIAGNOSIS — Z95810 Presence of automatic (implantable) cardiac defibrillator: Secondary | ICD-10-CM

## 2012-09-18 DIAGNOSIS — I4891 Unspecified atrial fibrillation: Secondary | ICD-10-CM

## 2012-09-18 LAB — ICD DEVICE OBSERVATION
BATTERY VOLTAGE: 2.82 V
DEV-0020ICD: NEGATIVE
FVT: 0
PACEART VT: 0
RV LEAD AMPLITUDE: 3.2704 mv
RV LEAD THRESHOLD: 1.5 V
RV LEAD THRESHOLD: 1.5 V
TZAT-0001ATACH: 1
TZAT-0001ATACH: 2
TZAT-0001ATACH: 3
TZAT-0001SLOWVT: 1
TZAT-0002ATACH: NEGATIVE
TZAT-0002FASTVT: NEGATIVE
TZAT-0002SLOWVT: NEGATIVE
TZAT-0012ATACH: 150 ms
TZAT-0012ATACH: 150 ms
TZAT-0012SLOWVT: 200 ms
TZAT-0018ATACH: NEGATIVE
TZAT-0018ATACH: NEGATIVE
TZAT-0018ATACH: NEGATIVE
TZAT-0018FASTVT: NEGATIVE
TZAT-0018SLOWVT: NEGATIVE
TZAT-0019ATACH: 6 V
TZAT-0019FASTVT: 8 V
TZAT-0019SLOWVT: 8 V
TZAT-0020ATACH: 1.5 ms
TZAT-0020FASTVT: 1.5 ms
TZON-0003ATACH: 350 ms
TZON-0004SLOWVT: 16
TZON-0005SLOWVT: 12
TZST-0001ATACH: 4
TZST-0001ATACH: 5
TZST-0001ATACH: 6
TZST-0001FASTVT: 3
TZST-0001FASTVT: 5
TZST-0001FASTVT: 6
TZST-0001SLOWVT: 4
TZST-0001SLOWVT: 5
TZST-0001SLOWVT: 6
TZST-0002ATACH: NEGATIVE
TZST-0002FASTVT: NEGATIVE
TZST-0002FASTVT: NEGATIVE
TZST-0002FASTVT: NEGATIVE
TZST-0002SLOWVT: NEGATIVE
TZST-0002SLOWVT: NEGATIVE
TZST-0002SLOWVT: NEGATIVE
VENTRICULAR PACING ICD: 91.81 pct
VF: 0

## 2012-09-18 NOTE — Progress Notes (Signed)
HPI  [name] is a 79 y.o. male seen in followup for an ICD implanted for primary   prevention of ischemic heart disease with last echo 2010 with EF 15-20%  He has permanent atrial  fibrillation (relatively) having gone into sinus rhythm at the time of his ICD.  He maintained sinus rhythm for a couple of months.  He noted   no change in his symptoms in the interval in 4 months.   he complains of fatigue and exercise intolerance. He denies chest pain or significant edema  He is tearful today. He is not sleeping well with early a.m. awakening. He told me today of the death of his son 10 years ago by suicide and his best friends dying a couple weeks ago. He tells me also that if you months ago he had his wife look up on the Internet the status of bodies from the [employer]	[place]; they had all passed away.  He denies chest pain or shortness of breath. He is seen today because his R waves had gone from 4.6-2.6 on his ICD recheck today is 3.3     Past Medical History  Diagnosis Date  . Atrial fibrillation     Chronic  . Coronary artery disease     Status post CABG. Hx of anterior apical infarct.  . Diabetes mellitus     Type 2  . Renal insufficiency     Chronic  . History of carotid endarterectomy     Status post right  . Hypertension   . Hyperlipidemia   . Gout   . CHF (congestive heart failure)     with chronic ischemic cardiomypathy. EF of 10-15%.  CHF due to systolic dysfunction. Clinically doing well.    Past Surgical History  Procedure Date  . Coronary artery bypass graft   . Tonsillectomy   . Bladder tumor excision   . Nose surgery   . Rib fracture surgery     Left   . US echocardiography 04-17-2009    Est EF 10-15%  . Cardiovascular stress test 04-17-2009    EF 29%    Current Outpatient Prescriptions  Medication Sig Dispense Refill  . allopurinol (ZYLOPRIM) 300 MG tablet Take 300 mg by mouth daily.        [name] ALPRAZolam (XANAX) 0.5 MG tablet at bedtime as needed.       [name] aspirin 81 MG tablet Take 81 mg by mouth daily.        [name] CALCIUM PO Take 500 mg by mouth 2 (two) times daily.       . COLCHICINE PO Take 0.6 mg by mouth daily as needed.       . digoxin (LANOXIN) 0.125 MG tablet TAKE 1 TABLET DAILY  90 tablet  1  . furosemide (LASIX) 40 MG tablet TAKE 1 TABLET TWICE A DAY  180 tablet  1  . lisinopril (PRINIVIL,ZESTRIL) 5 MG tablet Take 5 mg by mouth daily.       [name] lovastatin (MEVACOR) 20 MG tablet Take 20 mg by mouth at bedtime.        . multivitamin-lutein (OCUVITE-LUTEIN) CAPS Take 1 capsule by mouth daily.      . Warfarin Sodium (COUMADIN PO) Take by mouth. Take as Directed         Allergies  Allergen Reactions  . Penicillins     Review of Systems negative except from HPI and PMH  Physical Exam BP 119/50  Pulse 68  Ht 5\' 8"  (1.727  m)  Wt 122 lb (55.339 kg)  BMI 18.55 kg/m2 Well developed and well nourished in no acute distress HENT normal E scleral and icterus clear Neck Supple JVP <10 2Clear to ausculation Regular rate and rhythm, a 2/6 murmur at the base  2Soft with active bowel sounds No clubbing cyanosis Trace Edema Alert and oriented, grossly normal motor and sensory function are  Assessment and  Plan

## 2012-09-18 NOTE — Assessment & Plan Note (Signed)
The patient has significant depression. He has a passive but not active death wish. I've asked his permission to contact his PCP, Dr. Eloise Harman, and he has agreed. I've encouraged him to seek counseling. He lives in the context of the church and perhaps religiously counseling would be helpful. We discussed these things more than about 25 minutes

## 2012-09-18 NOTE — Assessment & Plan Note (Signed)
Atrial fibrillation is permanent. Rate control is adequate

## 2012-09-18 NOTE — Assessment & Plan Note (Signed)
R waves are diminished but still adequate

## 2012-09-18 NOTE — Assessment & Plan Note (Signed)
Will follow.

## 2012-09-18 NOTE — Assessment & Plan Note (Signed)
Stable and euvolemic 

## 2012-10-01 ENCOUNTER — Encounter: Payer: Self-pay | Admitting: Internal Medicine

## 2012-11-21 ENCOUNTER — Encounter: Payer: Self-pay | Admitting: Cardiology

## 2012-11-21 ENCOUNTER — Ambulatory Visit (INDEPENDENT_AMBULATORY_CARE_PROVIDER_SITE_OTHER): Payer: Medicare Other | Admitting: Cardiology

## 2012-11-21 VITALS — BP 128/60 | HR 68 | Ht 68.0 in | Wt 122.8 lb

## 2012-11-21 DIAGNOSIS — I4891 Unspecified atrial fibrillation: Secondary | ICD-10-CM

## 2012-11-21 DIAGNOSIS — E785 Hyperlipidemia, unspecified: Secondary | ICD-10-CM

## 2012-11-21 DIAGNOSIS — I2510 Atherosclerotic heart disease of native coronary artery without angina pectoris: Secondary | ICD-10-CM

## 2012-11-21 DIAGNOSIS — I10 Essential (primary) hypertension: Secondary | ICD-10-CM

## 2012-11-21 DIAGNOSIS — I509 Heart failure, unspecified: Secondary | ICD-10-CM

## 2012-11-21 DIAGNOSIS — Z95810 Presence of automatic (implantable) cardiac defibrillator: Secondary | ICD-10-CM

## 2012-11-21 NOTE — Patient Instructions (Signed)
Continue your current therapy  Watch your salt intake  We will check with Dr. Eloise Harman about your lab work.

## 2012-11-21 NOTE — Progress Notes (Signed)
[name] Date of Birth: 1925/02/18   History of Present Illness: [name] is seen today for followup. He is now 79 years old. He has a history of ischemic cardiomyopathy with ejection fraction of 10-15%. He is status post ICD implant. He has a history of remote anterior infarction and coronary bypass surgery. Over this past winter he states he was depressed. He had difficulty sleeping. He did discuss this with someone at his church and his mood seems to be better now. He has been able to sleep well over the past week and feels that his energy level is better. He does feel that he is weaker than he was a year ago. He is walking more now but is limited by some arthritis in his knees. He denies any increase in edema. He has no shortness of breath or chest pain.  Current Outpatient Prescriptions on File Prior to Visit  Medication Sig Dispense Refill  . allopurinol (ZYLOPRIM) 300 MG tablet Take 300 mg by mouth daily.        [name] ALPRAZolam (XANAX) 0.5 MG tablet at bedtime as needed.      [name] aspirin 81 MG tablet Take 81 mg by mouth daily.        [name] CALCIUM PO Take 500 mg by mouth 2 (two) times daily.       . COLCHICINE PO Take 0.6 mg by mouth daily as needed.       . digoxin (LANOXIN) 0.125 MG tablet TAKE 1 TABLET DAILY  90 tablet  1  . furosemide (LASIX) 40 MG tablet TAKE 1 TABLET TWICE A DAY  180 tablet  1  . lisinopril (PRINIVIL,ZESTRIL) 5 MG tablet Take 5 mg by mouth daily.       [name] lovastatin (MEVACOR) 20 MG tablet Take 20 mg by mouth at bedtime.        . multivitamin-lutein (OCUVITE-LUTEIN) CAPS Take 1 capsule by mouth daily.      . Warfarin Sodium (COUMADIN PO) Take by mouth. Take as Directed        No current facility-administered medications on file prior to visit.    Allergies  Allergen Reactions  . Penicillins     Past Medical History  Diagnosis Date  . Atrial fibrillation     Chronic  . Coronary artery disease     Status post CABG. Hx of anterior apical infarct.  . Diabetes  mellitus     Type 2  . Renal insufficiency     Chronic  . History of carotid endarterectomy     Status post right  . Hypertension   . Hyperlipidemia   . Gout   . CHF (congestive heart failure)     with chronic ischemic cardiomypathy. EF of 10-15%.  CHF due to systolic dysfunction. Clinically doing well.    Past Surgical History  Procedure Laterality Date  . Coronary artery bypass graft    . Tonsillectomy    . Bladder tumor excision    . Nose surgery    . Rib fracture surgery      Left   . US echocardiography  04-17-2009    Est EF 10-15%  . Cardiovascular stress test  04-17-2009    EF 29%    History  Smoking status  . Former Smoker  Smokeless tobacco  . Not on file    History  Alcohol Use No    History reviewed. No pertinent family history.  Review of Systems: The review of systems is positive for insomnia  and depression.  All other systems were reviewed and are negative.  Physical Exam: BP 128/60  Pulse 68  Ht 5\' 8"  (1.727 m)  Wt 122 lb 12.8 oz (55.702 kg)  BMI 18.68 kg/m2 He is an elderly white male in no acute distress. He is normocephalic, atraumatic. Pupils are equal round and reactive. Sclera clear. Oropharynx is clear. Neck is supple without adenopathy or thyromegaly. He has mild JVD. He has a very soft left carotid bruit. He has an old right CEA scar without bruit. Lungs are clear. Cardiac exam reveals a regular rate and rhythm with a grade 2/6 systolic murmur at apex. His rate is controlled. Abdomen is soft and nontender. He has no masses or bruits. Extremities are without edema. Pedal pulses are palpable. He is alert and oriented x3. Cranial nerves II through XII are intact  LABORATORY DATA: ECG today demonstrates atrial fibrillation. He is ventricularly paced at a rate of 58 beats per minute.  Assessment / Plan: 1. Chronic systolic congestive heart failure. This is secondary to an ischemic cardiomyopathy. Ejection fraction is 10-15%. He appears to be well  compensated today on Lasix 40 mg twice a day. He is also on Lanoxin and lisinopril. We will continue with his current therapy and sodium restriction. I have recommended a dig level be drawn with his next lab draw with [name].  2. Coronary disease status post CABG. He has no clinical symptoms of angina.  3. Permanent atrial fibrillation. Rate is well controlled. Continue long-term anticoagulation with Coumadin.  4. Prophylactic ICD.  5. Diabetes mellitus type 2.  6. Hyperlipidemia, on lovastatin.  7. Depression. Seems to be better now. I told him if he had worsening depression he should discuss this with a counselor or with [name].

## 2012-12-15 ENCOUNTER — Other Ambulatory Visit: Payer: Self-pay | Admitting: Cardiology

## 2012-12-17 ENCOUNTER — Ambulatory Visit (INDEPENDENT_AMBULATORY_CARE_PROVIDER_SITE_OTHER): Payer: Medicare Other | Admitting: *Deleted

## 2012-12-17 ENCOUNTER — Encounter: Payer: Self-pay | Admitting: Internal Medicine

## 2012-12-17 DIAGNOSIS — Z95810 Presence of automatic (implantable) cardiac defibrillator: Secondary | ICD-10-CM

## 2012-12-17 DIAGNOSIS — I509 Heart failure, unspecified: Secondary | ICD-10-CM

## 2012-12-17 DIAGNOSIS — I5022 Chronic systolic (congestive) heart failure: Secondary | ICD-10-CM

## 2012-12-31 LAB — REMOTE ICD DEVICE
AL IMPEDENCE ICD: 512 Ohm
ATRIAL PACING ICD: 0.33 pct
BRDY-0004RV: 130 {beats}/min
CHARGE TIME: 13.062 s
FVT: 0
RV LEAD AMPLITUDE: 16.4 mv
RV LEAD IMPEDENCE ICD: 300 Ohm
TOT-0001: 1
TOT-0002: 0
TZAT-0001ATACH: 1
TZAT-0001ATACH: 2
TZAT-0001ATACH: 3
TZAT-0001SLOWVT: 1
TZAT-0002ATACH: NEGATIVE
TZAT-0002FASTVT: NEGATIVE
TZAT-0002SLOWVT: NEGATIVE
TZAT-0012ATACH: 150 ms
TZAT-0012ATACH: 150 ms
TZAT-0012SLOWVT: 200 ms
TZAT-0018ATACH: NEGATIVE
TZAT-0018ATACH: NEGATIVE
TZAT-0018FASTVT: NEGATIVE
TZAT-0018SLOWVT: NEGATIVE
TZAT-0019FASTVT: 8 V
TZAT-0020FASTVT: 1.5 ms
TZON-0003ATACH: 350 ms
TZON-0004VSLOWVT: 28
TZST-0001ATACH: 4
TZST-0001ATACH: 6
TZST-0001FASTVT: 5
TZST-0001FASTVT: 6
TZST-0001SLOWVT: 4
TZST-0001SLOWVT: 5
TZST-0001SLOWVT: 6
TZST-0002ATACH: NEGATIVE
TZST-0002FASTVT: NEGATIVE
TZST-0002FASTVT: NEGATIVE
TZST-0002FASTVT: NEGATIVE
TZST-0002SLOWVT: NEGATIVE
TZST-0002SLOWVT: NEGATIVE

## 2013-01-22 ENCOUNTER — Encounter: Payer: Self-pay | Admitting: *Deleted

## 2013-02-18 ENCOUNTER — Telehealth: Payer: Self-pay | Admitting: Internal Medicine

## 2013-02-18 NOTE — Telephone Encounter (Signed)
Describes defib site/ placed 2009 as red x 2 weeks, he has a scratch across defib site, when he showers it burns and has been putting neosporin on site.  Pt fears what ever is beneath the skin may pop through.  Describes a spot that looks creamy but unsure if it is infection or some part of the device pushing through. Appointment made for tomorrow morning for site check. Pt agreed to plan.

## 2013-02-18 NOTE — Telephone Encounter (Signed)
New problem  Pt states that he is not in any pain however he said he has a hard spot in the location right above his defib and it looks as if it is trying to come out of his skin.

## 2013-02-19 ENCOUNTER — Ambulatory Visit: Payer: Medicare Other | Admitting: *Deleted

## 2013-02-19 ENCOUNTER — Ambulatory Visit (INDEPENDENT_AMBULATORY_CARE_PROVIDER_SITE_OTHER): Payer: Medicare Other | Admitting: Internal Medicine

## 2013-02-19 DIAGNOSIS — Z95810 Presence of automatic (implantable) cardiac defibrillator: Secondary | ICD-10-CM

## 2013-02-19 DIAGNOSIS — Z7901 Long term (current) use of anticoagulants: Secondary | ICD-10-CM

## 2013-02-19 DIAGNOSIS — T827XXA Infection and inflammatory reaction due to other cardiac and vascular devices, implants and grafts, initial encounter: Secondary | ICD-10-CM

## 2013-02-19 DIAGNOSIS — Z01810 Encounter for preprocedural cardiovascular examination: Secondary | ICD-10-CM

## 2013-02-19 LAB — CBC WITH DIFFERENTIAL/PLATELET
Eosinophils Absolute: 0.9 10*3/uL — ABNORMAL HIGH (ref 0.0–0.7)
MCHC: 32.8 g/dL (ref 30.0–36.0)
MCV: 103.2 fl — ABNORMAL HIGH (ref 78.0–100.0)
Monocytes Absolute: 0.9 10*3/uL (ref 0.1–1.0)
Neutrophils Relative %: 68.6 % (ref 43.0–77.0)
Platelets: 170 10*3/uL (ref 150.0–400.0)
RDW: 15.1 % — ABNORMAL HIGH (ref 11.5–14.6)
WBC: 11 10*3/uL — ABNORMAL HIGH (ref 4.5–10.5)

## 2013-02-19 LAB — PROTIME-INR
INR: 2.2 ratio — ABNORMAL HIGH (ref 0.8–1.0)
Prothrombin Time: 23.4 s — ABNORMAL HIGH (ref 10.2–12.4)

## 2013-02-19 LAB — BASIC METABOLIC PANEL
BUN: 55 mg/dL — ABNORMAL HIGH (ref 6–23)
CO2: 33 mEq/L — ABNORMAL HIGH (ref 19–32)
Chloride: 98 mEq/L (ref 96–112)
Creatinine, Ser: 2.3 mg/dL — ABNORMAL HIGH (ref 0.4–1.5)

## 2013-02-19 NOTE — Assessment & Plan Note (Signed)
Device was interrogated. He is about 100% ventricularly paced with an underlying conducted rhythm at about 30 beats per minute

## 2013-02-19 NOTE — Addendum Note (Signed)
Addended by: Demetrios Loll on: 02/19/2013 11:58 AM   Modules accepted: Orders

## 2013-02-19 NOTE — Assessment & Plan Note (Signed)
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 He was also seen in concert with Dr. Ladona Ridgel. If discussed surgical explantation. Given his bradycardia, he will need temporary permanent pacing after which we will need permanent device implantation. Given left ventricular dysfunction and a high degree of RV apical pacing I would favor cardiac resynchronization. Given his age I would be inclined to think in terms of CRT P. And not CRT-D. I have broached the subject with him and his wife.

## 2013-02-19 NOTE — Progress Notes (Signed)
Patient Care Team: Daniel Paterson, MD as PCP - General (Internal Medicine)   HPI  [name] is a 79 y.o. male Seen as an add-on today. He comes in reporting that there is a wound at his defibrillator site it has been present for 3-4 weeks. He is not aware of any trauma. He has not any fevers or chills . His ICD was initially implanted for primary  prevention of ischemic heart disease in 2009. His last echo 2010 with EF 15-20%   He has permanent atrial fibrillation (relatively) having gone into sinus rhythm at the time of his ICD. He reverted toatrial fibrillation without any changes in his symptoms.  At our last visit he was tearful at that time telling me about his son's suicide 10 years before and loss of cells orders and friends    Past Medical History  Diagnosis Date  . Atrial fibrillation     Chronic  . Coronary artery disease     Status post CABG. Hx of anterior apical infarct.  . Diabetes mellitus     Type 2  . Renal insufficiency     Chronic  . History of carotid endarterectomy     Status post right  . Hypertension   . Hyperlipidemia   . Gout   . CHF (congestive heart failure)     with chronic ischemic cardiomypathy. EF of 10-15%.  CHF due to systolic dysfunction. Clinically doing well.    Past Surgical History  Procedure Laterality Date  . Coronary artery bypass graft    . Tonsillectomy    . Bladder tumor excision    . Nose surgery    . Rib fracture surgery      Left   . Us echocardiography  04-17-2009    Est EF 10-15%  . Cardiovascular stress test  04-17-2009    EF 29%    Current Outpatient Prescriptions  Medication Sig Dispense Refill  . allopurinol (ZYLOPRIM) 300 MG tablet Take 300 mg by mouth daily.        . ALPRAZolam (XANAX) 0.5 MG tablet at bedtime as needed.      . aspirin 81 MG tablet Take 81 mg by mouth daily.        . CALCIUM PO Take 500 mg by mouth 2 (two) times daily.       . COLCHICINE PO Take 0.6 mg by mouth daily as needed.       .  digoxin (LANOXIN) 0.125 MG tablet TAKE 1 TABLET DAILY  90 tablet  1  . furosemide (LASIX) 40 MG tablet TAKE 1 TABLET TWICE A DAY  180 tablet  0  . HYDROcodone-acetaminophen (NORCO/VICODIN) 5-325 MG per tablet as needed.       . lisinopril (PRINIVIL,ZESTRIL) 5 MG tablet Take 5 mg by mouth daily.       . lovastatin (MEVACOR) 20 MG tablet Take 20 mg by mouth at bedtime.        . multivitamin-lutein (OCUVITE-LUTEIN) CAPS Take 1 capsule by mouth daily.      . Warfarin Sodium (COUMADIN PO) Take by mouth. Take as Directed        No current facility-administered medications for this visit.    Allergies  Allergen Reactions  . Penicillins     Review of Systems negative except from HPI and PMH  Physical Exam There were no vitals taken for this visit. Well developed and well nourished in no acute distress HENT normal E scleral and icterus clear Neck Supple JVP flat;   carotids brisk and full Clear to ausculation  Device pocket is eroded above the suturing sleeve Regular rate and rhythm, 2/6 murmur Soft with active bowel sounds No clubbing cyanosis none Edema Alert and oriented, grossly normal motor and sensory function Skin Warm and Dry    Assessment and  Plan  

## 2013-02-20 ENCOUNTER — Encounter: Payer: Medicare Other | Admitting: Internal Medicine

## 2013-02-20 ENCOUNTER — Telehealth: Payer: Self-pay | Admitting: Cardiology

## 2013-02-20 ENCOUNTER — Telehealth: Payer: Self-pay | Admitting: *Deleted

## 2013-02-20 ENCOUNTER — Encounter (HOSPITAL_COMMUNITY): Payer: Self-pay | Admitting: Respiratory Therapy

## 2013-02-20 NOTE — Telephone Encounter (Signed)
Returned call to patient advised 02/20/13 bmet revealed elevated bun,creat.advised to hold lasix for 2 days and then 40 mg daily.Repeat bmet 02/27/13.

## 2013-02-20 NOTE — Telephone Encounter (Signed)
Dr. Graciela Husbands reviewed pre-op labs. Pt aware he will hold his furosemide for 2 days (Thursday & Friday) then decrease his dose to once a day. He will return for lab work  On 02/27/13 after his preadmission work-up at [facility] hospital Mylo Red RN

## 2013-02-20 NOTE — Telephone Encounter (Signed)
New Prob     Pt has some questions regarding his LASIX prescription. Pt is needing clarification on directions. Please call.

## 2013-02-27 ENCOUNTER — Other Ambulatory Visit: Payer: Medicare Other

## 2013-02-27 ENCOUNTER — Other Ambulatory Visit: Payer: Self-pay | Admitting: *Deleted

## 2013-02-27 ENCOUNTER — Encounter: Payer: Self-pay | Admitting: Internal Medicine

## 2013-02-27 ENCOUNTER — Encounter (HOSPITAL_COMMUNITY): Payer: Self-pay

## 2013-02-27 ENCOUNTER — Ambulatory Visit (HOSPITAL_COMMUNITY)
Admission: RE | Admit: 2013-02-27 | Discharge: 2013-02-27 | Disposition: A | Discharge status: Home / Self Care | Payer: Medicare Other | Source: Ambulatory Visit | Attending: Anesthesiology | Admitting: Anesthesiology

## 2013-02-27 ENCOUNTER — Encounter (HOSPITAL_COMMUNITY)
Admission: RE | Admit: 2013-02-27 | Discharge: 2013-02-27 | Disposition: A | Discharge status: Home / Self Care | Payer: Medicare Other | Source: Ambulatory Visit | Attending: Internal Medicine | Admitting: Internal Medicine

## 2013-02-27 DIAGNOSIS — Z01818 Encounter for other preprocedural examination: Secondary | ICD-10-CM | POA: Insufficient documentation

## 2013-02-27 DIAGNOSIS — Z01812 Encounter for preprocedural laboratory examination: Secondary | ICD-10-CM | POA: Insufficient documentation

## 2013-02-27 DIAGNOSIS — Z95810 Presence of automatic (implantable) cardiac defibrillator: Secondary | ICD-10-CM

## 2013-02-27 HISTORY — DX: Unspecified osteoarthritis, unspecified site: M19.90

## 2013-02-27 HISTORY — DX: Gastro-esophageal reflux disease without esophagitis: K21.9

## 2013-02-27 HISTORY — DX: Anxiety disorder, unspecified: F41.9

## 2013-02-27 LAB — BASIC METABOLIC PANEL
Chloride: 99 mEq/L (ref 96–112)
GFR calc Af Amer: 36 mL/min — ABNORMAL LOW (ref >90)
GFR calc non Af Amer: 31 mL/min — ABNORMAL LOW (ref >90)
Glucose, Bld: 226 mg/dL — ABNORMAL HIGH (ref 70–99)
Potassium: 4.4 mEq/L (ref 3.5–5.1)
Sodium: 136 mEq/L (ref 135–145)

## 2013-02-27 LAB — CBC
HCT: 36.6 % — ABNORMAL LOW (ref 39.0–52.0)
Hemoglobin: 12.2 g/dL — ABNORMAL LOW (ref 13.0–17.0)
MCH: 33 pg (ref 26.0–34.0)
RBC: 3.7 MIL/uL — ABNORMAL LOW (ref 4.22–5.81)

## 2013-02-27 LAB — PROTIME-INR
INR: 1.59 — ABNORMAL HIGH (ref 0.00–1.49)
Prothrombin Time: 18.5 seconds — ABNORMAL HIGH (ref 11.6–15.2)

## 2013-02-27 NOTE — Progress Notes (Signed)
reqd notes, tests from most recent visit dr Swaziland. icd orders faxed to dr Ladona Ridgel, no orders ericka reqd orders

## 2013-02-27 NOTE — Pre-Procedure Instructions (Addendum)
HARMAN LANGHANS  02/27/2013   Your procedure is scheduled on:  03/01/13  Report to [facility] at  [phone].  Call this number if you have problems the morning of surgery: [phone]   Remember:   Do not eat food or drink liquids after midnight.   Take these medicines the morning of surgery with A SIP OF WATER: xanax,allopurinol,digoxin,pain med                 STOP aspirin and coumadin per dr taylor   Do not wear jewelry, make-up or nail polish.  Do not wear lotions, powders, or perfumes. You may wear deodorant.  Do not shave 48 hours prior to surgery. Men may shave face and neck.  Do not bring valuables to the hospital.  [facility] is not responsible                   for any belongings or valuables.  Contacts, dentures or bridgework may not be worn into surgery.  Leave suitcase in the car. After surgery it may be brought to your room.  For patients admitted to the hospital, checkout time is 11:00 AM the day of  discharge.   Patients discharged the day of surgery will not be allowed to drive  home.  Name and phone number of your driver:   Special Instructions: Shower using CHG 2 nights before surgery and the night before surgery.  If you shower the day of surgery use CHG.  Use special wash - you have one bottle of CHG for all showers.  You should use approximately 1/3 of the bottle for each shower.   Please read over the following fact sheets that you were given: Pain Booklet, Coughing and Deep Breathing, MRSA Information and Surgical Site Infection Prevention

## 2013-02-28 MED ORDER — SODIUM CHLORIDE 0.9 % IR SOLN
80.0000 mg | Status: DC
  Filled 2013-02-28: qty 2

## 2013-02-28 MED ORDER — VANCOMYCIN HCL IN DEXTROSE 1-5 GM/200ML-% IV SOLN
1000.0000 mg | INTRAVENOUS | Status: AC
  Administered 2013-03-01: 1000 mg via INTRAVENOUS
  Filled 2013-02-28: qty 200

## 2013-02-28 NOTE — Progress Notes (Signed)
Anesthesia Chart Review: Patient is a 79 year old male scheduled for defibrillator system extraction (due to infection) and insertion of temporary permanent pacemaker on 03/01/13 by Dr. Ladona Ridgel.  According to Dr. Odessa Fleming note from 02/19/13, device interrogation revealed that he was 100% ventricularly paced with an underlying conducted rhythm at ~ 30 bpm..."Given his bradycardia, he will need temporary permanent pacing after which we will need permanent device implantation. Given left ventricular dysfunction and a high degree of RV apical pacing I would favor cardiac resynchronization. Given his age I would be inclined to think in terms of CRT P. And not CRT-D."  Other history includes CAD/MI s/p CABG, ischemic cardiomyopathy, chronic systolic CHF, afib, DM2, CKD, right carotid endarterectomy, HTN, HLD, gout, anxiety, GERD, arthritis, former smoker, bladder cancer s/p tumor excision. PCP is Dr. Jarome Matin.  Primary cardiologist is Dr. Swaziland.  Echo on 12/05/11 showed: - Left ventricle: LVEF is approximately 25% with diffuse hypokinesis, worse in the anterior and lateral walls. The cavity size was normal. Wall thickness was normal. - Aortic valve: AV is thckened, calcified with mildly restricted motion. Peak and mean gradients through the valve are 13 and 7 mm Hg. May be lower than expected due to depressed LVEF. Mild regurgitation. Mean gradient: 7mm Hg (S). Peak gradient: 13mm Hg (S). - Mitral valve: Mild regurgitation. - Right ventricle: Systolic function was mildly reduced. - Right atrium: The atrium was mildly dilated. - Tricuspid valve: Moderate regurgitation. - Pulmonary arteries: PA peak pressure: 66mm Hg (S).  His last cardiac cath was on 01/04/05 and showed severe LM and 2V CAD with patent LIMA to LAD and SVG to OM grafts.  Continued medical therapy was recommended.  CXR on 02/27/13 showed no active disease.  Preoperative labs noted.  BUN/Cr 26/44 and appear to be trending more down to his  baseline since the adjustment in Lasix dosing.  Non-fasting glucose 226.  H/H 12.2/36.6, PLT 182. He will get a CBG, T&S, and repeat PT/INR on arrival.  Notes indicate that the manufacturer of his cardiac implanted device is [employer].  Nursing staff to follow-up ICD Rx form and notification of the [employer] rep.    Velna Ochs [facility]Anesthesiology Phone [phone] 02/28/2013 9:49 AM

## 2013-02-28 NOTE — Progress Notes (Signed)
Re requested  Implanted device order form from [facility].

## 2013-03-01 ENCOUNTER — Encounter (HOSPITAL_COMMUNITY): Payer: Self-pay | Admitting: Vascular Surgery

## 2013-03-01 ENCOUNTER — Encounter (HOSPITAL_COMMUNITY)
Admission: RE | Disposition: A | Discharge status: Home / Self Care | Payer: Self-pay | Source: Ambulatory Visit | Attending: Internal Medicine

## 2013-03-01 ENCOUNTER — Ambulatory Visit (HOSPITAL_COMMUNITY): Payer: Medicare Other | Admitting: Anesthesiology

## 2013-03-01 ENCOUNTER — Inpatient Hospital Stay (HOSPITAL_COMMUNITY)
Admission: RE | Admit: 2013-03-01 | Discharge: 2013-03-03 | DRG: 261 | Disposition: A | Discharge status: Home / Self Care | Payer: Medicare Other | Source: Ambulatory Visit | Attending: Internal Medicine | Admitting: Internal Medicine

## 2013-03-01 ENCOUNTER — Encounter (HOSPITAL_COMMUNITY): Payer: Self-pay | Admitting: *Deleted

## 2013-03-01 DIAGNOSIS — M109 Gout, unspecified: Secondary | ICD-10-CM | POA: Diagnosis present

## 2013-03-01 DIAGNOSIS — E119 Type 2 diabetes mellitus without complications: Secondary | ICD-10-CM | POA: Diagnosis present

## 2013-03-01 DIAGNOSIS — Z95810 Presence of automatic (implantable) cardiac defibrillator: Secondary | ICD-10-CM

## 2013-03-01 DIAGNOSIS — F411 Generalized anxiety disorder: Secondary | ICD-10-CM | POA: Diagnosis present

## 2013-03-01 DIAGNOSIS — K5900 Constipation, unspecified: Secondary | ICD-10-CM | POA: Diagnosis not present

## 2013-03-01 DIAGNOSIS — I2589 Other forms of chronic ischemic heart disease: Secondary | ICD-10-CM | POA: Diagnosis present

## 2013-03-01 DIAGNOSIS — I5022 Chronic systolic (congestive) heart failure: Secondary | ICD-10-CM | POA: Diagnosis present

## 2013-03-01 DIAGNOSIS — I4891 Unspecified atrial fibrillation: Secondary | ICD-10-CM | POA: Diagnosis present

## 2013-03-01 DIAGNOSIS — I509 Heart failure, unspecified: Secondary | ICD-10-CM | POA: Diagnosis present

## 2013-03-01 DIAGNOSIS — T827XXA Infection and inflammatory reaction due to other cardiac and vascular devices, implants and grafts, initial encounter: Principal | ICD-10-CM | POA: Diagnosis present

## 2013-03-01 DIAGNOSIS — I129 Hypertensive chronic kidney disease with stage 1 through stage 4 chronic kidney disease, or unspecified chronic kidney disease: Secondary | ICD-10-CM | POA: Diagnosis present

## 2013-03-01 DIAGNOSIS — Z7901 Long term (current) use of anticoagulants: Secondary | ICD-10-CM

## 2013-03-01 DIAGNOSIS — K219 Gastro-esophageal reflux disease without esophagitis: Secondary | ICD-10-CM | POA: Diagnosis present

## 2013-03-01 DIAGNOSIS — N184 Chronic kidney disease, stage 4 (severe): Secondary | ICD-10-CM | POA: Diagnosis present

## 2013-03-01 DIAGNOSIS — T827XXD Infection and inflammatory reaction due to other cardiac and vascular devices, implants and grafts, subsequent encounter: Secondary | ICD-10-CM

## 2013-03-01 DIAGNOSIS — Z951 Presence of aortocoronary bypass graft: Secondary | ICD-10-CM

## 2013-03-01 DIAGNOSIS — E785 Hyperlipidemia, unspecified: Secondary | ICD-10-CM | POA: Diagnosis present

## 2013-03-01 DIAGNOSIS — Z87891 Personal history of nicotine dependence: Secondary | ICD-10-CM

## 2013-03-01 DIAGNOSIS — Y838 Other surgical procedures as the cause of abnormal reaction of the patient, or of later complication, without mention of misadventure at the time of the procedure: Secondary | ICD-10-CM | POA: Diagnosis present

## 2013-03-01 DIAGNOSIS — I2510 Atherosclerotic heart disease of native coronary artery without angina pectoris: Secondary | ICD-10-CM | POA: Diagnosis present

## 2013-03-01 DIAGNOSIS — Z7982 Long term (current) use of aspirin: Secondary | ICD-10-CM

## 2013-03-01 DIAGNOSIS — Z79899 Other long term (current) drug therapy: Secondary | ICD-10-CM

## 2013-03-01 HISTORY — PX: PACEMAKER INSERTION: SHX728

## 2013-03-01 HISTORY — PX: ICD LEAD REMOVAL: SHX5855

## 2013-03-01 HISTORY — PX: PACEMAKER LEAD REMOVAL: SHX5064

## 2013-03-01 LAB — POCT I-STAT GLUCOSE: Operator id: 117071

## 2013-03-01 LAB — PREPARE RBC (CROSSMATCH)

## 2013-03-01 SURGERY — Surgical Case
Anesthesia: *Unknown

## 2013-03-01 SURGERY — ICD GENERATOR CHANGE
Anesthesia: General

## 2013-03-01 SURGERY — REMOVAL, ELECTRODE LEAD, ICD
Anesthesia: General | Site: Chest | Wound class: Dirty or Infected

## 2013-03-01 MED ORDER — DIGOXIN 125 MCG PO TABS
0.1250 mg | ORAL_TABLET | Freq: Every day | ORAL | Status: DC
  Administered 2013-03-02 – 2013-03-03 (×2): 0.125 mg via ORAL
  Filled 2013-03-01 (×2): qty 1

## 2013-03-01 MED ORDER — FENTANYL CITRATE 0.05 MG/ML IJ SOLN
INTRAMUSCULAR | Status: DC | PRN
  Administered 2013-03-01: 100 ug via INTRAVENOUS

## 2013-03-01 MED ORDER — ALLOPURINOL 300 MG PO TABS
300.0000 mg | ORAL_TABLET | Freq: Every day | ORAL | Status: DC
  Administered 2013-03-02 – 2013-03-03 (×2): 300 mg via ORAL
  Filled 2013-03-01 (×2): qty 1

## 2013-03-01 MED ORDER — MIDAZOLAM HCL 5 MG/5ML IJ SOLN
INTRAMUSCULAR | Status: DC | PRN
  Administered 2013-03-01: 2 mg via INTRAVENOUS

## 2013-03-01 MED ORDER — WARFARIN SODIUM 1 MG PO TABS
1.0000 mg | ORAL_TABLET | ORAL | Status: DC
  Administered 2013-03-02: 1 mg via ORAL
  Filled 2013-03-01: qty 1

## 2013-03-01 MED ORDER — PHENYLEPHRINE HCL 10 MG/ML IJ SOLN
10.0000 mg | INTRAVENOUS | Status: DC | PRN
  Administered 2013-03-01: 40 ug/min via INTRAVENOUS

## 2013-03-01 MED ORDER — VANCOMYCIN HCL 10 G IV SOLR
1500.0000 mg | Freq: Two times a day (BID) | INTRAVENOUS | Status: AC
  Administered 2013-03-01: 1500 mg via INTRAVENOUS
  Filled 2013-03-01: qty 1500

## 2013-03-01 MED ORDER — LISINOPRIL 5 MG PO TABS
5.0000 mg | ORAL_TABLET | Freq: Every day | ORAL | Status: DC
  Administered 2013-03-02 – 2013-03-03 (×2): 5 mg via ORAL
  Filled 2013-03-01 (×2): qty 1

## 2013-03-01 MED ORDER — WARFARIN SODIUM 2 MG PO TABS
2.0000 mg | ORAL_TABLET | ORAL | Status: DC
  Administered 2013-03-01: 2 mg via ORAL
  Filled 2013-03-01 (×2): qty 1

## 2013-03-01 MED ORDER — WARFARIN SODIUM 1 MG PO TABS: 1.0000 mg | ORAL_TABLET | Freq: Every day | ORAL | Status: DC

## 2013-03-01 MED ORDER — OXYCODONE HCL 5 MG PO TABS: 5.0000 mg | ORAL_TABLET | Freq: Once | ORAL | Status: DC | PRN

## 2013-03-01 MED ORDER — LACTATED RINGERS IV SOLN
INTRAVENOUS | Status: DC | PRN
  Administered 2013-03-01: 12:00:00 via INTRAVENOUS

## 2013-03-01 MED ORDER — ACETAMINOPHEN 325 MG PO TABS
ORAL_TABLET | ORAL | Status: AC
  Filled 2013-03-01: qty 2

## 2013-03-01 MED ORDER — ONDANSETRON HCL 4 MG/2ML IJ SOLN: 4.0000 mg | Freq: Four times a day (QID) | INTRAMUSCULAR | Status: DC | PRN

## 2013-03-01 MED ORDER — HYDROCODONE-ACETAMINOPHEN 5-325 MG PO TABS
1.0000 | ORAL_TABLET | Freq: Every day | ORAL | Status: DC | PRN
  Administered 2013-03-02: 1 via ORAL
  Filled 2013-03-01: qty 1

## 2013-03-01 MED ORDER — HEPARIN SOD (PORK) LOCK FLUSH 100 UNIT/ML IV SOLN
INTRAVENOUS | Status: AC
  Filled 2013-03-01: qty 5

## 2013-03-01 MED ORDER — COLCHICINE 0.6 MG PO TABS
0.6000 mg | ORAL_TABLET | Freq: Every day | ORAL | Status: DC | PRN
Start: 2013-03-01 — End: 2013-03-03
  Filled 2013-03-01: qty 1

## 2013-03-01 MED ORDER — OXYCODONE HCL 5 MG/5ML PO SOLN: 5.0000 mg | Freq: Once | ORAL | Status: DC | PRN

## 2013-03-01 MED ORDER — ALPRAZOLAM 0.25 MG PO TABS: 1.0000 mg | ORAL_TABLET | Freq: Every evening | ORAL | Status: DC | PRN

## 2013-03-01 MED ORDER — LIDOCAINE HCL (PF) 1 % IJ SOLN
INTRAMUSCULAR | Status: AC
  Filled 2013-03-01: qty 30

## 2013-03-01 MED ORDER — HYDROMORPHONE HCL PF 1 MG/ML IJ SOLN: 0.2500 mg | INTRAMUSCULAR | Status: DC | PRN

## 2013-03-01 MED ORDER — SODIUM CHLORIDE 0.9 % IR SOLN
Status: DC | PRN
  Administered 2013-03-01: 14:00:00

## 2013-03-01 MED ORDER — LACTATED RINGERS IV SOLN
INTRAVENOUS | Status: DC | PRN
  Administered 2013-03-01: 14:00:00 via INTRAVENOUS

## 2013-03-01 MED ORDER — CHLORHEXIDINE GLUCONATE 4 % EX LIQD: 60.0000 mL | Freq: Once | CUTANEOUS | Status: DC

## 2013-03-01 MED ORDER — SODIUM CHLORIDE 0.9 % IV SOLN
INTRAVENOUS | Status: DC
  Administered 2013-03-01: 11:00:00 via INTRAVENOUS

## 2013-03-01 MED ORDER — PROPOFOL 10 MG/ML IV BOLUS
INTRAVENOUS | Status: DC | PRN
  Administered 2013-03-01: 100 mg via INTRAVENOUS

## 2013-03-01 MED ORDER — ACETAMINOPHEN 325 MG PO TABS
325.0000 mg | ORAL_TABLET | ORAL | Status: DC | PRN
  Administered 2013-03-01 – 2013-03-03 (×2): 650 mg via ORAL
  Filled 2013-03-01 (×2): qty 2

## 2013-03-01 MED ORDER — ASPIRIN 81 MG PO CHEW
81.0000 mg | CHEWABLE_TABLET | Freq: Every day | ORAL | Status: DC
  Administered 2013-03-01 – 2013-03-03 (×3): 81 mg via ORAL
  Filled 2013-03-01 (×3): qty 1

## 2013-03-01 MED ORDER — PROMETHAZINE HCL 25 MG/ML IJ SOLN: 6.2500 mg | INTRAMUSCULAR | Status: DC | PRN

## 2013-03-01 MED ORDER — ROCURONIUM BROMIDE 100 MG/10ML IV SOLN
INTRAVENOUS | Status: DC | PRN
  Administered 2013-03-01: 40 mg via INTRAVENOUS

## 2013-03-01 MED ORDER — WARFARIN - PHYSICIAN DOSING INPATIENT: Freq: Every day | Status: DC

## 2013-03-01 MED ORDER — CALCIUM CARBONATE-VITAMIN D 500-200 MG-UNIT PO TABS
1.0000 | ORAL_TABLET | Freq: Every day | ORAL | Status: DC
  Administered 2013-03-02 – 2013-03-03 (×2): 1 via ORAL
  Filled 2013-03-01 (×3): qty 1

## 2013-03-01 MED ORDER — SIMVASTATIN 5 MG PO TABS
5.0000 mg | ORAL_TABLET | Freq: Every day | ORAL | Status: DC
  Administered 2013-03-01 – 2013-03-02 (×2): 5 mg via ORAL
  Filled 2013-03-01 (×4): qty 1

## 2013-03-01 MED ORDER — GENTAMICIN SULFATE 40 MG/ML IJ SOLN
INTRAMUSCULAR | Status: AC
  Filled 2013-03-01: qty 2

## 2013-03-01 MED ORDER — ASPIRIN 81 MG PO TABS: 81.0000 mg | ORAL_TABLET | Freq: Every day | ORAL | Status: DC

## 2013-03-01 MED ORDER — FUROSEMIDE 40 MG PO TABS
40.0000 mg | ORAL_TABLET | ORAL | Status: DC
  Administered 2013-03-01 – 2013-03-02 (×2): 40 mg via ORAL
  Filled 2013-03-01 (×3): qty 1

## 2013-03-01 MED ORDER — LIDOCAINE HCL (PF) 1 % IJ SOLN
INTRAMUSCULAR | Status: DC | PRN
  Administered 2013-03-01: 30 mL

## 2013-03-01 SURGICAL SUPPLY — 43 items
BAG BANDED W/RUBBER/TAPE 36X54 (MISCELLANEOUS) ×3 IMPLANT
BAG EQP BAND 135X91 W/RBR TAPE (MISCELLANEOUS) ×2
BLADE SURG ROTATE 9660 (MISCELLANEOUS) ×3 IMPLANT
CANISTER SUCTION 2500CC (MISCELLANEOUS) ×3 IMPLANT
CLEANER TIP ELECTROSURG 2X2 (MISCELLANEOUS) ×3 IMPLANT
CLOTH BEACON ORANGE TIMEOUT ST (SAFETY) ×3 IMPLANT
COVER TABLE BACK 60X90 (DRAPES) ×3 IMPLANT
DEVICE TORQUE H2O (MISCELLANEOUS) ×3 IMPLANT
DRAPE CARDIOVASCULAR INCISE (DRAPES) ×2
DRAPE INCISE IOBAN 66X45 STRL (DRAPES) ×3 IMPLANT
DRAPE PROXIMA HALF (DRAPES) IMPLANT
DRAPE SRG 135X102X78XABS (DRAPES) ×2 IMPLANT
ELECT REM PT RETURN 9FT ADLT (ELECTROSURGICAL) ×6
ELECTRODE REM PT RTRN 9FT ADLT (ELECTROSURGICAL) ×4 IMPLANT
GAUZE PACKING IODOFORM 1 (PACKING) IMPLANT
GAUZE SPONGE 4X4 16PLY XRAY LF (GAUZE/BANDAGES/DRESSINGS) IMPLANT
GLOVE BIO SURGEON STRL SZ7.5 (GLOVE) ×6 IMPLANT
GLOVE BIOGEL PI IND STRL 6.5 (GLOVE) ×2 IMPLANT
GLOVE BIOGEL PI IND STRL 7.5 (GLOVE) ×2 IMPLANT
GLOVE BIOGEL PI INDICATOR 6.5 (GLOVE) ×1
GLOVE BIOGEL PI INDICATOR 7.5 (GLOVE) ×1
GLOVE ECLIPSE 7.5 STRL STRAW (GLOVE) ×3 IMPLANT
GLOVE ECLIPSE 8.0 STRL XLNG CF (GLOVE) ×3 IMPLANT
GOWN PREVENTION PLUS XLARGE (GOWN DISPOSABLE) ×3 IMPLANT
GOWN STRL NON-REIN LRG LVL3 (GOWN DISPOSABLE) ×6 IMPLANT
GOWN STRL REIN XL XLG (GOWN DISPOSABLE) ×3 IMPLANT
GUIDEWIRE ANGLED .035X150CM (WIRE) ×3 IMPLANT
KIT ROOM TURNOVER OR (KITS) ×3 IMPLANT
LEAD CAPSURE NOVUS 5076-58CM (Lead) ×3 IMPLANT
PAD ARMBOARD 7.5X6 YLW CONV (MISCELLANEOUS) ×6 IMPLANT
PAD DEFIB R2 (MISCELLANEOUS) ×3 IMPLANT
PENCIL BUTTON HOLSTER BLD 10FT (ELECTRODE) IMPLANT
SET MICROPUNCTURE 5F STIFF (MISCELLANEOUS) ×3 IMPLANT
SHEATH BRITE TIP 8FR 35CM (SHEATH) ×3 IMPLANT
SPONGE GAUZE 4X4 12PLY (GAUZE/BANDAGES/DRESSINGS) ×3 IMPLANT
SUT PROLENE 2 0 CT2 30 (SUTURE) ×6 IMPLANT
SUT SILK 0 FSL (SUTURE) ×9 IMPLANT
SUT VIC AB 2-0 CT2 18 VCP726D (SUTURE) ×3 IMPLANT
SUT VIC AB 3-0 X1 27 (SUTURE) ×3 IMPLANT
TOWEL OR 17X24 6PK STRL BLUE (TOWEL DISPOSABLE) ×15 IMPLANT
TRAY FOLEY IC TEMP SENS 14FR (CATHETERS) ×3 IMPLANT
TUBE CONNECTING 12X1/4 (SUCTIONS) ×3 IMPLANT
YANKAUER SUCT BULB TIP NO VENT (SUCTIONS) ×3 IMPLANT

## 2013-03-01 NOTE — Anesthesia Postprocedure Evaluation (Signed)
Anesthesia Post Note  Patient: [name]  Procedure(s) Performed: Procedure(s) (LRB): ICD LEAD REMOVAL (Left) PACEMAKER LEAD REMOVAL (Left) GENERATOR REMOVAL (Left) INSERTION PACEMAKER LEAD (N/A)  Anesthesia type: general  Patient location: PACU  Post pain: Pain level controlled  Post assessment: Patient's Cardiovascular Status Stable  Last Vitals:  Filed Vitals:   03/01/13 1645  BP: 138/84  Pulse:   Temp:   Resp:     Post vital signs: Reviewed and stable  Level of consciousness: sedated  Complications: No apparent anesthesia complications

## 2013-03-01 NOTE — Op Note (Signed)
Temporary PPM extraction followed by DDD ICD extraction carried out without immediate complication. [id number].

## 2013-03-01 NOTE — Anesthesia Procedure Notes (Signed)
Procedure Name: Intubation Date/Time: 03/01/2013 1:00 PM Performed by: Alanda Amass A Pre-anesthesia Checklist: Patient identified, Emergency Drugs available, Suction available, Patient being monitored and Timeout performed Patient Re-evaluated:Patient Re-evaluated prior to inductionOxygen Delivery Method: Circle system utilized Preoxygenation: Pre-oxygenation with 100% oxygen Intubation Type: IV induction Ventilation: Mask ventilation without difficulty Laryngoscope Size: Mac and 3 Grade View: Grade I Tube type: Oral Tube size: 7.5 mm Number of attempts: 1 Airway Equipment and Method: Stylet Placement Confirmation: ETT inserted through vocal cords under direct vision Secured at: 21 cm Tube secured with: Tape Dental Injury: Teeth and Oropharynx as per pre-operative assessment

## 2013-03-01 NOTE — Transfer of Care (Signed)
Immediate Anesthesia Transfer of Care Note  Patient: [name]  Procedure(s) Performed: Procedure(s): ICD LEAD REMOVAL (Left) PACEMAKER LEAD REMOVAL (Left) GENERATOR REMOVAL (Left) INSERTION PACEMAKER LEAD (N/A)  Patient Location: PACU  Anesthesia Type:General  Level of Consciousness: awake  Airway & Oxygen Therapy: Patient Spontanous Breathing and Patient connected to nasal cannula oxygen  Post-op Assessment: Report given to PACU RN and Post -op Vital signs reviewed and stable  Post vital signs: Reviewed and stable  Complications: No apparent anesthesia complications

## 2013-03-01 NOTE — Anesthesia Preprocedure Evaluation (Addendum)
Anesthesia Evaluation  Patient identified by MRN, date of birth, ID band Patient awake    Reviewed: Allergy & Precautions, H&P , NPO status , Patient's Chart, lab work & pertinent test results  History of Anesthesia Complications Negative for: history of anesthetic complications  Airway Mallampati: II TM Distance: >3 FB Neck ROM: Full    Dental  (+) Teeth Intact and Dental Advisory Given   Pulmonary neg pulmonary ROS, former smoker,          Cardiovascular hypertension, Pt. on medications + CAD, + CABG and +CHF + dysrhythmias Atrial Fibrillation + Cardiac Defibrillator  Echo on 12/05/11 showed: - Left ventricle: LVEF is approximately 25% with diffuse hypokinesis, worse in the anterior and lateral walls. The cavity size was normal. Wall thickness was normal.    Neuro/Psych PSYCHIATRIC DISORDERS Anxiety Depression negative neurological ROS     GI/Hepatic Neg liver ROS, GERD-  Medicated and Controlled,  Endo/Other  diabetes  Renal/GU Renal InsufficiencyRenal disease     Musculoskeletal   Abdominal   Peds  Hematology   Anesthesia Other Findings   Reproductive/Obstetrics                         Anesthesia Physical Anesthesia Plan  ASA: III  Anesthesia Plan: General   Post-op Pain Management:    Induction: Intravenous  Airway Management Planned: Oral ETT  Additional Equipment: Arterial line  Intra-op Plan:   Post-operative Plan: Extubation in OR  Informed Consent:   Plan Discussed with: CRNA, Anesthesiologist and Surgeon  Anesthesia Plan Comments:         Anesthesia Quick Evaluation

## 2013-03-01 NOTE — Interval H&P Note (Signed)
History and Physical Interval Note: I saw and examined the patient with Dr. Graciela Husbands on the day that it was discovered that his device lead was protruding through his skin and I have discussed the risk/benefit/goal and expectation of ICD system removal and insertion of a temporary perm PPM and he wishes to proceed. 03/01/2013 12:39 PM  [name]  has presented today for surgery, with the diagnosis of icd infection  The various methods of treatment have been discussed with the patient and family. After consideration of risks, benefits and other options for treatment, the patient has consented to  Procedure(s) with comments: ICD LEAD REMOVAL (Left) - temp PM backup as a surgical intervention .  The patient's history has been reviewed, patient examined, no change in status, stable for surgery.  I have reviewed the patient's chart and labs.  Questions were answered to the patient's satisfaction.     Leonia Reeves.D.

## 2013-03-01 NOTE — H&P (View-Only) (Signed)
Patient Care Team: Jarome Matin, MD as PCP - General (Internal Medicine)   HPI  [name] is a 79 y.o. male Seen as an add-on today. He comes in reporting that there is a wound at his defibrillator site it has been present for 3-4 weeks. He is not aware of any trauma. He has not any fevers or chills . His ICD was initially implanted for primary  prevention of ischemic heart disease in 2009. His last echo 2010 with EF 15-20%   He has permanent atrial fibrillation (relatively) having gone into sinus rhythm at the time of his ICD. He reverted toatrial fibrillation without any changes in his symptoms.  At our last visit he was tearful at that time telling me about his son's suicide 10 years before and loss of cells orders and friends    Past Medical History  Diagnosis Date  . Atrial fibrillation     Chronic  . Coronary artery disease     Status post CABG. Hx of anterior apical infarct.  . Diabetes mellitus     Type 2  . Renal insufficiency     Chronic  . History of carotid endarterectomy     Status post right  . Hypertension   . Hyperlipidemia   . Gout   . CHF (congestive heart failure)     with chronic ischemic cardiomypathy. EF of 10-15%.  CHF due to systolic dysfunction. Clinically doing well.    Past Surgical History  Procedure Laterality Date  . Coronary artery bypass graft    . Tonsillectomy    . Bladder tumor excision    . Nose surgery    . Rib fracture surgery      Left   . US echocardiography  04-17-2009    Est EF 10-15%  . Cardiovascular stress test  04-17-2009    EF 29%    Current Outpatient Prescriptions  Medication Sig Dispense Refill  . allopurinol (ZYLOPRIM) 300 MG tablet Take 300 mg by mouth daily.        [name] ALPRAZolam (XANAX) 0.5 MG tablet at bedtime as needed.      [name] aspirin 81 MG tablet Take 81 mg by mouth daily.        [name] CALCIUM PO Take 500 mg by mouth 2 (two) times daily.       . COLCHICINE PO Take 0.6 mg by mouth daily as needed.       .  digoxin (LANOXIN) 0.125 MG tablet TAKE 1 TABLET DAILY  90 tablet  1  . furosemide (LASIX) 40 MG tablet TAKE 1 TABLET TWICE A DAY  180 tablet  0  . HYDROcodone-acetaminophen (NORCO/VICODIN) 5-325 MG per tablet as needed.       [name] lisinopril (PRINIVIL,ZESTRIL) 5 MG tablet Take 5 mg by mouth daily.       [name] lovastatin (MEVACOR) 20 MG tablet Take 20 mg by mouth at bedtime.        . multivitamin-lutein (OCUVITE-LUTEIN) CAPS Take 1 capsule by mouth daily.      . Warfarin Sodium (COUMADIN PO) Take by mouth. Take as Directed        No current facility-administered medications for this visit.    Allergies  Allergen Reactions  . Penicillins     Review of Systems negative except from HPI and PMH  Physical Exam There were no vitals taken for this visit. Well developed and well nourished in no acute distress HENT normal E scleral and icterus clear Neck Supple JVP flat;  carotids brisk and full Clear to ausculation  Device pocket is eroded above the suturing sleeve Regular rate and rhythm, 2/6 murmur Soft with active bowel sounds No clubbing cyanosis none Edema Alert and oriented, grossly normal motor and sensory function Skin Warm and Dry    Assessment and  Plan

## 2013-03-01 NOTE — Progress Notes (Signed)
Patients temp has gone up shaking states he is not cold , Dr. Gelene Mink called may give tylenol from surgeons orders

## 2013-03-01 NOTE — Op Note (Signed)
NAMENEEL, BUFFONE NO.:  [id number]  MEDICAL RECORD NO.:  [id number]  LOCATION:  2603                         FACILITY:  [facility]  PHYSICIAN:  Doylene Canning. Ladona Ridgel, MD    DATE OF BIRTH:  July 03, 1925  DATE OF PROCEDURE:  03/01/2013 DATE OF DISCHARGE:                              OPERATIVE REPORT   PROCEDURES PERFORMED:  Insertion of a temporary permanent transvenous pacemaker followed by extraction of a dual-chamber ICD which had developed pocket erosion.  INTRODUCTION:  The patient is a very pleasant 79 year old male with complete heart block and chronic systolic heart failure, EF 25% status post dual-chamber ICD implantation.  The patient's heart failure symptoms have been well controlled.  He is now referred for insertion of a temporary permanent transvenous pacemaker followed by extraction of his infected dual-chamber ICD.  DESCRIPTION OF PROCEDURE:  After informed consent was obtained, the patient was taken to the operating room in the fasting state.  After usual preparation and draping, the initial attempts to puncture the left subclavian vein were unsuccessful.  The left subclavian vein was then punctured and the Medtronic model 5076, 58 cm active fixation pacing lead, serial number [id number] was advanced into the right ventricle and actively fixed.  The lead was secured to the skin with silk suture and the sewing sleeve was also secured to the skin with silk suture.  Pacing clips were then placed.  Attention was then turned to removal of the defibrillator and its atrial and defibrillation leads.  A 30 mL of lidocaine was infiltrated into the left infraclavicular region.  A 7-cm incision was carried out over this region.  Electrocautery was utilized to dissect down to the fascial plane.  The pocket was entered with electrocautery and the generator was removed with gentle traction.  The generator was disconnected from the atrial and ventricular leads.   The atrial lead was targeted first.  The helix of the lead was retracted back into the body of the lead without difficulty and gentle traction was placed on the lead after the sewing sleeve had been removed and the lead was removed in total without any hemodynamic sequelae.  Attention was then turned to the defibrillator lead.  It was a dual coil active fixation Medtronic 6947 lead.  The helix was retracted eventually.  The lead was cut.  The sewing sleeve was removed and the Liberator locking stylet was advanced into the lead body and actively fixed.  Silk ties were then secured to the proximal portion of the lead.  The Cook 11- [place] Shortie RL dissection sheath was advanced over the lead and into the subclavian vein.  Next, the longer Cook RL dissection sheath was advanced into the vein and utilizing a combination of pressure, counter pressure, traction, and counter traction, and with the active cutting mechanism of a lead, the lead was removed in total with moderate difficulty.  There were no hemodynamic sequelae.  Pressure was held. Hemostasis was obtained.  Pocket was irrigated.  The electrocautery was utilized to assure hemostasis and the incision was closed with 2-0 Prolene mattress sutures.  A pressure dressing was placed and the patient was returned to the  recovery area in satisfactory condition.  COMPLICATIONS:  There were no immediate procedure complications.  RESULTS:  This demonstrate successful extraction of a Medtronic dual- chamber ICD and insertion of a temporary and permanent transvenous pacemaker.     Doylene Canning. Ladona Ridgel, MD     GWT/MEDQ  D:  03/01/2013  T:  03/01/2013  Job:  [id number]

## 2013-03-01 NOTE — Preoperative (Signed)
Beta Blockers   Reason not to administer Beta Blockers:Pt. not currently on hmoe beta blockers

## 2013-03-01 NOTE — Progress Notes (Signed)
Dr. Gelene Mink at bedside would like Dr. Ladona Ridgel called and informed that patient had received tylenol 650mg  for elevated temp. Pt will be receiving vancomycin q 12 hours ordered by Dr. Ladona Ridgel pt states he shakes at home.so that is not new but possibly increased due to temperature

## 2013-03-02 ENCOUNTER — Encounter (HOSPITAL_COMMUNITY): Payer: Self-pay | Admitting: *Deleted

## 2013-03-02 ENCOUNTER — Inpatient Hospital Stay (HOSPITAL_COMMUNITY): Payer: Medicare Other

## 2013-03-02 DIAGNOSIS — I5022 Chronic systolic (congestive) heart failure: Secondary | ICD-10-CM

## 2013-03-02 DIAGNOSIS — I4891 Unspecified atrial fibrillation: Secondary | ICD-10-CM

## 2013-03-02 MED ORDER — DOCUSATE SODIUM 100 MG PO CAPS
100.0000 mg | ORAL_CAPSULE | Freq: Two times a day (BID) | ORAL | Status: DC
  Administered 2013-03-02 – 2013-03-03 (×3): 100 mg via ORAL
  Filled 2013-03-02 (×4): qty 1

## 2013-03-02 MED ORDER — DOXYCYCLINE HYCLATE 100 MG PO TABS
100.0000 mg | ORAL_TABLET | Freq: Two times a day (BID) | ORAL | Status: DC
  Administered 2013-03-02 – 2013-03-03 (×3): 100 mg via ORAL
  Filled 2013-03-02 (×4): qty 1

## 2013-03-02 MED ORDER — OXYCODONE HCL 5 MG PO TABS
5.0000 mg | ORAL_TABLET | Freq: Three times a day (TID) | ORAL | Status: DC | PRN
  Administered 2013-03-02 – 2013-03-03 (×2): 5 mg via ORAL
  Filled 2013-03-02 (×2): qty 1

## 2013-03-02 MED ORDER — BISACODYL 5 MG PO TBEC: 5.0000 mg | DELAYED_RELEASE_TABLET | Freq: Every day | ORAL | Status: DC | PRN

## 2013-03-02 MED ORDER — TRAZODONE HCL 50 MG PO TABS
50.0000 mg | ORAL_TABLET | Freq: Every evening | ORAL | Status: DC | PRN
  Administered 2013-03-02: 50 mg via ORAL
  Filled 2013-03-02: qty 1

## 2013-03-02 NOTE — Progress Notes (Addendum)
Patient ID: [name], male   DOB: [birth year], 79 y.o.   MRN: [id number]    SUBJECTIVE: Pacemaker site stable.  Patient is constipated, has pain in his neck, and had trouble sleeping.   [name] allopurinol  300 mg Oral Daily  . aspirin  81 mg Oral Daily  . calcium-vitamin D  1 tablet Oral Q breakfast  . digoxin  0.125 mg Oral Daily  . furosemide  40 mg Oral 1 day or 1 dose  . lisinopril  5 mg Oral Daily  . simvastatin  5 mg Oral q1800  . warfarin  1 mg Oral Custom  . warfarin  2 mg Oral Custom  . Warfarin - Physician Dosing Inpatient   Does not apply q1800      Filed Vitals:   03/02/13 0400 03/02/13 0811 03/02/13 0927 03/02/13 0928  BP: 106/45 110/67 131/58   Pulse: 80 80  80  Temp: 98.9 F (37.2 C) 99.2 F (37.3 C)    TempSrc: Oral Oral    Resp: 19 21    SpO2: 97% 99%      Intake/Output Summary (Last 24 hours) at 03/02/13 1146 Last data filed at 03/02/13 0800  Gross per 24 hour  Intake   1780 ml  Output   1625 ml  Net    155 ml    LABS: Basic Metabolic Panel:  Recent Labs  40/98/11 1441 03/01/13 1413  NA 136  --   K 4.4  --   CL 99  --   CO2 26  --   GLUCOSE 226* 138*  BUN 44*  --   CREATININE 1.88*  --   CALCIUM 9.4  --    Liver Function Tests: No results found for this basename: AST, ALT, ALKPHOS, BILITOT, PROT, ALBUMIN,  in the last 72 hours No results found for this basename: LIPASE, AMYLASE,  in the last 72 hours CBC:  Recent Labs  02/27/13 1441  WBC 10.6*  HGB 12.2*  HCT 36.6*  MCV 98.9  PLT 182   Cardiac Enzymes: No results found for this basename: CKTOTAL, CKMB, CKMBINDEX, TROPONINI,  in the last 72 hours BNP: No components found with this basename: POCBNP,  D-Dimer: No results found for this basename: DDIMER,  in the last 72 hours Hemoglobin A1C: No results found for this basename: HGBA1C,  in the last 72 hours Fasting Lipid Panel: No results found for this basename: CHOL, HDL, LDLCALC, TRIG, CHOLHDL, LDLDIRECT,  in the last 72  hours Thyroid Function Tests: No results found for this basename: TSH, T4TOTAL, FREET3, T3FREE, THYROIDAB,  in the last 72 hours Anemia Panel: No results found for this basename: VITAMINB12, FOLATE, FERRITIN, TIBC, IRON, RETICCTPCT,  in the last 72 hours  RADIOLOGY: Dg Chest 2 View  03/02/2013   *RADIOLOGY REPORT*  Clinical Data: Status post ICD extraction  CHEST - 2 VIEW  Comparison: Chest radiograph 02/27/2013  Findings: Interval exchange of left pacer pack.  There is a single continuous lead over the right ventricle. No evidence pneumothorax. No pulmonary edema.  IMPRESSION: No complication following pacer exchanged.   Original Report Authenticated By: Genevive Bi, M.D.   Dg Chest 2 View  02/27/2013   *RADIOLOGY REPORT*  Clinical Data: Preoperative exam prior to explantation of infected defibrillator.  CHEST - 2 VIEW  Comparison: 08/29/2011  Findings: Gross radiographic appearance of the defibrillator is stable and without disruption.  Lungs are clear.  No edema, infiltrate, nodule or pleural fluid is detected.  Cardiac and mediastinal  contours are within normal limits.  The bony thorax is unremarkable.  IMPRESSION: No active disease.   Original Report Authenticated By: Irish Lack, M.D.    PHYSICAL EXAM General: NAD Neck: No JVD, no thyromegaly or thyroid nodule.  Lungs: Clear to auscultation bilaterally with normal respiratory effort. CV: Nondisplaced PMI.  Heart regular S1/S2, no S3/S4, no murmur.  No peripheral edema.  No carotid bruit.  Normal pedal pulses.  Abdomen: Soft, nontender, no hepatosplenomegaly, no distention.  Neurologic: Alert and oriented x 3.  Psych: Normal affect. Extremities: No clubbing or cyanosis.   TELEMETRY: Reviewed telemetry pt in V-paced, underlying atrial fibrillation  ASSESSMENT AND PLAN: 79 yo with history of chronic systolic CHF, chronic atrial fibrillation, and CHB with dual chamber ICD.  He developed ICD site erosion and had it explanted with  temporary permanent pacemaker placed yesterday.  1. ICD site erosion/infection: Not toxic, Tm 99.2.  WBCs have not been elevated.  He has temporary permanent PCM that appears to be functioning appropriately.  Plan was home on Keflex tomorrow but he is allergic to PCN so will start doxycycline po for now.  Have talked to pharmacy and they are going to investigate further whether this would be the best agent.  Will need CRT-P device when re-implanted.  2. Chronic systolic CHF: Stable, continue home meds.  3. Ambulate in halls today, if remains stable home in am.  4. Atrial fibrillation: Chronic.  Coumadin restarted.   [name] 03/02/2013 11:51 AM

## 2013-03-03 LAB — CBC
MCHC: 33.9 g/dL (ref 30.0–36.0)
Platelets: 141 10*3/uL — ABNORMAL LOW (ref 150–400)
RDW: 14.1 % (ref 11.5–15.5)

## 2013-03-03 LAB — BASIC METABOLIC PANEL
BUN: 39 mg/dL — ABNORMAL HIGH (ref 6–23)
Creatinine, Ser: 1.88 mg/dL — ABNORMAL HIGH (ref 0.50–1.35)
GFR calc Af Amer: 36 mL/min — ABNORMAL LOW (ref >90)
GFR calc non Af Amer: 31 mL/min — ABNORMAL LOW (ref >90)
Potassium: 4.2 mEq/L (ref 3.5–5.1)

## 2013-03-03 LAB — PROTIME-INR: Prothrombin Time: 16.7 seconds — ABNORMAL HIGH (ref 11.6–15.2)

## 2013-03-03 MED ORDER — DOXYCYCLINE HYCLATE 100 MG PO TABS: 100.0000 mg | ORAL_TABLET | Freq: Two times a day (BID) | ORAL | Status: DC

## 2013-03-03 NOTE — Discharge Summary (Signed)
Physician Discharge Summary  Patient ID: [name] MRN: [id number] DOB/AGE: March 14, 1925 79 y.o.  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 Coronary artery disease with previous bypass grafting an anteroapical infarction 3. Chronic atrial fibrillation 4. Type 2 diabetes mellitus 5. Chronic kidney disease stage 3-4 6. Hypertension 7. Hyperlipidemia 8. Ischemic cardiomyopathy with ejection fraction of 15-20%  Procedures:  Insertion of a temporary permanent transvenous pacemaker followed by extraction of a dual-chamber ICD that had developed pocket erosion  Hospital Course: This 79 year old male has a known history of complete heart block and chronic systolic heart failure with ischemic cardiomyopathy. He had a previous dual-chamber ICD that had developed pocket erosion. He was brought in for removal of the defibrillator and placement of a temporary permanent pacemaker.  The patient was taken to the operating room and had a Medtronic 5076 active fixation lead serial number [id number] placed in the right ventricle and was externalized and hooked to a pulse generator. The AICD and leads were removed in the operating room by Dr. Ladona Ridgel. He developed some mild temperature and shaking chills to receive vancomycin following the procedure and then was placed on doxycycline. He was stable the day of discharge and was ambulatory in the hall with only minimal dizziness. His temporary permanent pacemaker is functioning adequately and his atrial fibrillation and chronic kidney disease were stable. He will see Dr. Ladona Ridgel in followup in 10 days.[name] Ultimate plan will be replacement of the temporary permanent with a CRT pacemaking device because of the patient's age.  Discharge Exam: Blood pressure 119/73, pulse 79, temperature 98.6 F (37 C), temperature  source Oral, resp. rate 18, height 5\' 8"  (1.727 m), weight 57 kg (125 lb 10.6 oz), SpO2 99.00%.   Lungs clear, external pacemaker present  Labs: CBC:   Lab Results  Component Value Date   WBC 10.0 03/03/2013   HGB 9.9* 03/03/2013   HCT 29.2* 03/03/2013   MCV 97.7 03/03/2013   PLT 141* 03/03/2013    CMP:  Recent Labs Lab 03/03/13 0515  NA 134*  K 4.2  CL 101  CO2 24  BUN 39*  CREATININE 1.88*  CALCIUM 8.4  GLUCOSE 132*    Radiology: Lungs clear, defibrillator present, normal heart size  EKG: Paced rhythm  Discharge Medications:   Medication List         allopurinol 300 MG tablet  Commonly known as:  ZYLOPRIM  Take 300 mg by mouth daily.     ALPRAZolam 0.5 MG tablet  Commonly known as:  XANAX  Take 1 mg by mouth at bedtime as needed for sleep.     aspirin 81 MG tablet  Take 81 mg by mouth daily.     calcium-vitamin D 500-200 MG-UNIT per tablet  Commonly known as:  OSCAL WITH D  Take 1 tablet by mouth daily with breakfast.     colchicine 0.6 MG tablet  Take 0.6 mg by mouth daily as needed (for gout).     digoxin 0.125 MG tablet  Commonly known as:  LANOXIN  Take 0.125 mg by mouth daily.     doxycycline 100 MG tablet  Commonly known as:  VIBRA-TABS  Take 1 tablet (100 mg total) by mouth every 12 (twelve) hours.     furosemide 40 MG tablet  Commonly known as:  LASIX  Take 40 mg by mouth 1 day or 1 dose.  HYDROcodone-acetaminophen 5-325 MG per tablet  Commonly known as:  NORCO/VICODIN  Take 1 tablet by mouth daily as needed for pain.     lisinopril 5 MG tablet  Commonly known as:  PRINIVIL,ZESTRIL  Take 5 mg by mouth daily.     lovastatin 20 MG tablet  Commonly known as:  MEVACOR  Take 20 mg by mouth at bedtime.     multivitamin-lutein Caps  Take 1 capsule by mouth daily.     warfarin 2 MG tablet  Commonly known as:  COUMADIN  Take 1-2 mg by mouth daily. Take 1mg  on Tuesday and Saturday. Take 2mg  the rest of the week         Followup  plans and appointments: Followup with Dr. Sharrell Ku in 10 days  Time spent with patient to include physician time: 45 minutes  Signed: W. Ashley Royalty. MD [facility] 03/03/2013, 2:56 PM

## 2013-03-03 NOTE — Progress Notes (Signed)
Patient ID: [name], male   DOB: [birth year], 79 y.o.   MRN: [id number]    SUBJECTIVE: Pacemaker site stable.  Walked yesterday.  Still not able to sleep.    [name] allopurinol  300 mg Oral Daily  . aspirin  81 mg Oral Daily  . calcium-vitamin D  1 tablet Oral Q breakfast  . digoxin  0.125 mg Oral Daily  . docusate sodium  100 mg Oral BID  . doxycycline  100 mg Oral Q12H  . furosemide  40 mg Oral 1 day or 1 dose  . lisinopril  5 mg Oral Daily  . simvastatin  5 mg Oral q1800  . warfarin  1 mg Oral Custom  . warfarin  2 mg Oral Custom  . Warfarin - Physician Dosing Inpatient   Does not apply q1800      Filed Vitals:   03/03/13 0000 03/03/13 0405 03/03/13 0800 03/03/13 1040  BP: 99/52 116/61 97/47 112/52  Pulse: 80 80 79 81  Temp: 98.2 F (36.8 C) 97.9 F (36.6 C) 98.7 F (37.1 C)   TempSrc: Oral Oral Oral   Resp: 17 17 20    Height:      Weight:  57 kg (125 lb 10.6 oz)    SpO2: 97% 98% 99%     Intake/Output Summary (Last 24 hours) at 03/03/13 1055 Last data filed at 03/03/13 1000  Gross per 24 hour  Intake    120 ml  Output   1500 ml  Net  -1380 ml    LABS: Basic Metabolic Panel:  Recent Labs  14/78/29 1413 03/03/13 0515  NA  --  134*  K  --  4.2  CL  --  101  CO2  --  24  GLUCOSE 138* 132*  BUN  --  39*  CREATININE  --  1.88*  CALCIUM  --  8.4   Liver Function Tests: No results found for this basename: AST, ALT, ALKPHOS, BILITOT, PROT, ALBUMIN,  in the last 72 hours No results found for this basename: LIPASE, AMYLASE,  in the last 72 hours CBC:  Recent Labs  03/03/13 0515  WBC 10.0  HGB 9.9*  HCT 29.2*  MCV 97.7  PLT 141*   Cardiac Enzymes: No results found for this basename: CKTOTAL, CKMB, CKMBINDEX, TROPONINI,  in the last 72 hours BNP: No components found with this basename: POCBNP,  D-Dimer: No results found for this basename: DDIMER,  in the last 72 hours Hemoglobin A1C: No results found for this basename: HGBA1C,  in the last 72  hours Fasting Lipid Panel: No results found for this basename: CHOL, HDL, LDLCALC, TRIG, CHOLHDL, LDLDIRECT,  in the last 72 hours Thyroid Function Tests: No results found for this basename: TSH, T4TOTAL, FREET3, T3FREE, THYROIDAB,  in the last 72 hours Anemia Panel: No results found for this basename: VITAMINB12, FOLATE, FERRITIN, TIBC, IRON, RETICCTPCT,  in the last 72 hours  RADIOLOGY: Dg Chest 2 View  03/02/2013   *RADIOLOGY REPORT*  Clinical Data: Status post ICD extraction  CHEST - 2 VIEW  Comparison: Chest radiograph 02/27/2013  Findings: Interval exchange of left pacer pack.  There is a single continuous lead over the right ventricle. No evidence pneumothorax. No pulmonary edema.  IMPRESSION: No complication following pacer exchanged.   Original Report Authenticated By: Genevive Bi, M.D.   Dg Chest 2 View  02/27/2013   *RADIOLOGY REPORT*  Clinical Data: Preoperative exam prior to explantation of infected defibrillator.  CHEST - 2 VIEW  Comparison: 08/29/2011  Findings: Gross radiographic appearance of the defibrillator is stable and without disruption.  Lungs are clear.  No edema, infiltrate, nodule or pleural fluid is detected.  Cardiac and mediastinal contours are within normal limits.  The bony thorax is unremarkable.  IMPRESSION: No active disease.   Original Report Authenticated By: Irish Lack, M.D.    PHYSICAL EXAM General: NAD Neck: No JVD, no thyromegaly or thyroid nodule.  Lungs: Clear to auscultation bilaterally with normal respiratory effort. CV: Nondisplaced PMI.  Heart regular S1/S2, no S3/S4, no murmur.  No peripheral edema.  No carotid bruit.  Normal pedal pulses.  Abdomen: Soft, nontender, no hepatosplenomegaly, no distention.  Neurologic: Alert and oriented x 3.  Psych: Normal affect. Extremities: No clubbing or cyanosis.  PCM site stable  TELEMETRY: Reviewed telemetry pt in V-paced, underlying atrial fibrillation  ASSESSMENT AND PLAN: 79 yo with history  of chronic systolic CHF, chronic atrial fibrillation, and CHB with dual chamber ICD.  He developed ICD site erosion and had it explanted with temporary permanent pacemaker placed yesterday.  1. ICD site erosion/infection: Not toxic, afebrile.  WBCs have not been elevated.  He has temporary permanent PCM that appears to be functioning appropriately.  He is on doxycycline (PCN allergy).  Will need CRT-P device when re-implanted.  2. Chronic systolic CHF: Stable, continue home meds.  3. CKD: Creatinine at baseline.   4. Atrial fibrillation: Chronic.  Coumadin restarted.  5. Disposition: Home today with care instructions for temporary permanent PCM.  Will take doxycycline 100 mg bid at least until followup with Dr. Ladona Ridgel.  Continue prior cardiac meds.  Will need coumadin clinic followup and followup with Dr. Ladona Ridgel within 2 wks.   [name] 03/03/2013 10:55 AM

## 2013-03-03 NOTE — Progress Notes (Signed)
Pt d/c home via w/c.  Tele and IV removed.  Pt d/c instructions given and pt verbalizes understanding.  Salomon Mast, RN

## 2013-03-03 NOTE — Progress Notes (Signed)
Pt ambulated ~112ft around the unit. Pt stated that he felt dizzy when first standing; once that stabilized, he walked with 1-assist.  Pt tolerated well.

## 2013-03-03 NOTE — Discharge Instructions (Signed)
Supplemental Discharge Instructions for  Pacemaker/Defibrillator Patients  Activity No heavy lifting or vigorous activity with your left/right arm for 6 to 8 weeks.  Do not raise your left/right arm above your head for one week.  Gradually raise your affected arm as drawn below.           No driving  WOUND CARE   Keep the wound area clean and dry.  Do not get this area wet for one week.    The tape/steri-strips on your wound will fall off; do not pull them off.  No bandage is needed on the site.  DO  NOT apply any creams, oils, or ointments to the wound area.   If you notice any drainage or discharge from the wound, any swelling or bruising at the site, or you develop a fever > 101? F after you are discharged home, call the office at once.  Special Instructions   You are still able to use cellular telephones; use the ear opposite the side where you have your pacemaker/defibrillator.  Avoid carrying your cellular phone near your device.   When traveling through airports, show security personnel your identification card to avoid being screened in the metal detectors.  Ask the security personnel to use the hand wand.   Avoid arc welding equipment, MRI testing (magnetic resonance imaging), TENS units (transcutaneous nerve stimulators).  Call the office for questions about other devices.   Avoid electrical appliances that are in poor condition or are not properly grounded.   Microwave ovens are safe to be near or to operate.  Additional information for defibrillator patients should your device go off:   If your device goes off ONCE and you feel fine afterward, notify the device clinic nurses.   If your device goes off ONCE and you do not feel well afterward, call 911.   If your device goes off TWICE, call 911.   If your device goes off THREE times in one day, call 911.  DO NOT DRIVE YOURSELF OR A FAMILY MEMBER WITH A DEFIBRILLATOR TO THE HOSPITAL--CALL 911.

## 2013-03-04 LAB — TYPE AND SCREEN
ABO/RH(D): O POS
Antibody Screen: NEGATIVE
Unit division: 0

## 2013-03-04 MED FILL — Heparin Sodium (Porcine) Inj 1000 Unit/ML: INTRAMUSCULAR | Qty: 30 | Status: AC

## 2013-03-04 MED FILL — Sodium Chloride IV Soln 0.9%: INTRAVENOUS | Qty: 1000 | Status: AC

## 2013-03-05 ENCOUNTER — Encounter (HOSPITAL_COMMUNITY): Payer: Self-pay | Admitting: Internal Medicine

## 2013-03-07 ENCOUNTER — Ambulatory Visit (INDEPENDENT_AMBULATORY_CARE_PROVIDER_SITE_OTHER): Payer: Medicare Other | Admitting: *Deleted

## 2013-03-07 DIAGNOSIS — I4891 Unspecified atrial fibrillation: Secondary | ICD-10-CM

## 2013-03-07 LAB — PACEMAKER DEVICE OBSERVATION

## 2013-03-07 LAB — POCT INR: INR: 1.4

## 2013-03-07 NOTE — Addendum Note (Signed)
Addended by: Forestine Chute on: 03/07/2013 04:49 PM   Modules accepted: Orders

## 2013-03-07 NOTE — Progress Notes (Signed)
Wound check temporary pacer in office.

## 2013-03-13 ENCOUNTER — Telehealth: Payer: Self-pay | Admitting: Internal Medicine

## 2013-03-13 ENCOUNTER — Emergency Department (HOSPITAL_COMMUNITY)
Admission: EM | Admit: 2013-03-13 | Discharge: 2013-03-13 | Disposition: A | Discharge status: Home / Self Care | Payer: Medicare Other | Attending: Emergency Medicine | Admitting: Emergency Medicine

## 2013-03-13 DIAGNOSIS — Z880 Allergy status to penicillin: Secondary | ICD-10-CM | POA: Insufficient documentation

## 2013-03-13 DIAGNOSIS — Z8551 Personal history of malignant neoplasm of bladder: Secondary | ICD-10-CM | POA: Insufficient documentation

## 2013-03-13 DIAGNOSIS — Y831 Surgical operation with implant of artificial internal device as the cause of abnormal reaction of the patient, or of later complication, without mention of misadventure at the time of the procedure: Secondary | ICD-10-CM | POA: Insufficient documentation

## 2013-03-13 DIAGNOSIS — Z7982 Long term (current) use of aspirin: Secondary | ICD-10-CM | POA: Insufficient documentation

## 2013-03-13 DIAGNOSIS — Z79899 Other long term (current) drug therapy: Secondary | ICD-10-CM | POA: Insufficient documentation

## 2013-03-13 DIAGNOSIS — I129 Hypertensive chronic kidney disease with stage 1 through stage 4 chronic kidney disease, or unspecified chronic kidney disease: Secondary | ICD-10-CM | POA: Insufficient documentation

## 2013-03-13 DIAGNOSIS — M109 Gout, unspecified: Secondary | ICD-10-CM | POA: Insufficient documentation

## 2013-03-13 DIAGNOSIS — Z950 Presence of cardiac pacemaker: Secondary | ICD-10-CM | POA: Insufficient documentation

## 2013-03-13 DIAGNOSIS — M129 Arthropathy, unspecified: Secondary | ICD-10-CM | POA: Insufficient documentation

## 2013-03-13 DIAGNOSIS — F411 Generalized anxiety disorder: Secondary | ICD-10-CM | POA: Insufficient documentation

## 2013-03-13 DIAGNOSIS — Z95810 Presence of automatic (implantable) cardiac defibrillator: Secondary | ICD-10-CM | POA: Insufficient documentation

## 2013-03-13 DIAGNOSIS — I509 Heart failure, unspecified: Secondary | ICD-10-CM | POA: Insufficient documentation

## 2013-03-13 DIAGNOSIS — Z8719 Personal history of other diseases of the digestive system: Secondary | ICD-10-CM | POA: Insufficient documentation

## 2013-03-13 DIAGNOSIS — E785 Hyperlipidemia, unspecified: Secondary | ICD-10-CM | POA: Insufficient documentation

## 2013-03-13 DIAGNOSIS — E119 Type 2 diabetes mellitus without complications: Secondary | ICD-10-CM | POA: Insufficient documentation

## 2013-03-13 DIAGNOSIS — I2510 Atherosclerotic heart disease of native coronary artery without angina pectoris: Secondary | ICD-10-CM | POA: Insufficient documentation

## 2013-03-13 DIAGNOSIS — R58 Hemorrhage, not elsewhere classified: Secondary | ICD-10-CM

## 2013-03-13 DIAGNOSIS — I4891 Unspecified atrial fibrillation: Secondary | ICD-10-CM | POA: Insufficient documentation

## 2013-03-13 DIAGNOSIS — Z951 Presence of aortocoronary bypass graft: Secondary | ICD-10-CM | POA: Insufficient documentation

## 2013-03-13 DIAGNOSIS — Z87891 Personal history of nicotine dependence: Secondary | ICD-10-CM | POA: Insufficient documentation

## 2013-03-13 DIAGNOSIS — N189 Chronic kidney disease, unspecified: Secondary | ICD-10-CM | POA: Insufficient documentation

## 2013-03-13 DIAGNOSIS — IMO0002 Reserved for concepts with insufficient information to code with codable children: Secondary | ICD-10-CM | POA: Insufficient documentation

## 2013-03-13 LAB — CBC WITH DIFFERENTIAL/PLATELET
Eosinophils Relative: 6 % — ABNORMAL HIGH (ref 0–5)
HCT: 33.7 % — ABNORMAL LOW (ref 39.0–52.0)
Lymphocytes Relative: 16 % (ref 12–46)
Lymphs Abs: 1.7 10*3/uL (ref 0.7–4.0)
MCV: 97.4 fL (ref 78.0–100.0)
Monocytes Absolute: 0.9 10*3/uL (ref 0.1–1.0)
Platelets: 207 10*3/uL (ref 150–400)
RBC: 3.46 MIL/uL — ABNORMAL LOW (ref 4.22–5.81)
WBC: 10.1 10*3/uL (ref 4.0–10.5)

## 2013-03-13 LAB — PROTIME-INR: Prothrombin Time: 19.4 seconds — ABNORMAL HIGH (ref 11.6–15.2)

## 2013-03-13 NOTE — Consult Note (Signed)
ELECTROPHYSIOLOGY CONSULT NOTE  Patient ID: [name] MRN: [id number], DOB/AGE: 1925/08/10   Admit date: 03/13/2013 Date of Consult: 03/13/2013  Primary Physician: Garlan Fillers, MD Primary Cardiologist / EP: [place], MD / Graciela Husbands, MD Reason for Consultation: Bleeding at ICD explant site  History of Present Illness [name] is a 79 y.o. male with an ischemic CM, EF 20%, s/p ICD implant who was recently found to have ICD pocket infection with erosion and underwent ICD system extraction with Dr. Ladona Ridgel on 03/01/2013. He was discharged on 03/03/2013 with temp-perm pacemaker in place. He was seen in our office for wound check on 03/08/2013 and his temp-perm pacemaker was functioning normally. His explant site was intact and without bleeding or hematoma.   [name] presents to the ED today with new onset bleeding from the explant site. He states this AM while seated watching TV he noticed his shirt became wet. When he looked down he noticed blood had oozed through his shirt. He called our office and was instructed to come here. He denies any other symptoms or problems. He states he has been feeling well, like his usual self. He denies CP or SOB. He denies palpitations, dizziness, near syncope or syncope. He denies LE or abdominal swelling. He denies orthopnea or PND. He denies fever or chills.   Past Medical History Past Medical History  Diagnosis Date  . Atrial fibrillation     Chronic  . Coronary artery disease     Status post CABG. Hx of anterior apical infarct.  . Renal insufficiency     Chronic  . History of carotid endarterectomy     Status post right  . Hypertension   . Hyperlipidemia   . Gout   . CHF (congestive heart failure)     with chronic ischemic cardiomypathy. EF of 10-15%.  CHF due to systolic dysfunction. Clinically doing well.  . Automatic implantable cardioverter-defibrillator in situ   . Anxiety     anxiety  . Diabetes mellitus     Type 2 no med for over 1  yr  . GERD (gastroesophageal reflux disease)   . Cancer     bladder  . Arthritis     Past Surgical History Past Surgical History  Procedure Laterality Date  . Tonsillectomy    . Bladder tumor excision    . Nose surgery    . Rib fracture surgery      Left   . US echocardiography  04-17-2009    Est EF 10-15%  . Cardiovascular stress test  04-17-2009    EF 29%  . Tonsillectomy    . Coronary artery bypass graft  04  . Carotid endarterectomy      right      10+ yrs ago     (left side 100percent blocked)  . Icd lead removal Left 03/01/2013    Procedure: ICD LEAD REMOVAL;  Surgeon: Marinus Maw, MD;  Location: [facility] OR;  Service: Cardiovascular;  Laterality: Left;  . Pacemaker lead removal Left 03/01/2013    Procedure: PACEMAKER LEAD REMOVAL;  Surgeon: Marinus Maw, MD;  Location: [facility] OR;  Service: Cardiovascular;  Laterality: Left;  . Pacemaker insertion N/A 03/01/2013    Procedure: INSERTION PACEMAKER LEAD;  Surgeon: Marinus Maw, MD;  Location: [facility] OR;  Service: Cardiovascular;  Laterality: N/A;    Allergies/Intolerances Allergies  Allergen Reactions  . Penicillins Hives and Itching   Current Home Medications   Medication List    ASK your doctor  about these medications       allopurinol 300 MG tablet  Commonly known as:  ZYLOPRIM  Take 300 mg by mouth daily.     ALPRAZolam 0.5 MG tablet  Commonly known as:  XANAX  Take 1 mg by mouth at bedtime as needed for sleep.     aspirin 81 MG tablet  Take 81 mg by mouth daily.     calcium-vitamin D 500-200 MG-UNIT per tablet  Commonly known as:  OSCAL WITH D  Take 1 tablet by mouth daily.     colchicine 0.6 MG tablet  Take 0.6 mg by mouth daily as needed (for gout).     digoxin 0.125 MG tablet  Commonly known as:  LANOXIN  Take 0.125 mg by mouth daily.     doxycycline 100 MG tablet  Commonly known as:  VIBRA-TABS  Take 100 mg by mouth 2 (two) times daily.     furosemide 40 MG tablet  Commonly known as:  LASIX  Take 40  mg by mouth every morning.     HYDROcodone-acetaminophen 5-325 MG per tablet  Commonly known as:  NORCO/VICODIN  Take 1 tablet by mouth daily as needed for pain.     lisinopril 5 MG tablet  Commonly known as:  PRINIVIL,ZESTRIL  Take 5 mg by mouth daily.     loratadine 10 MG tablet  Commonly known as:  CLARITIN  Take 10 mg by mouth daily.     lovastatin 20 MG tablet  Commonly known as:  MEVACOR  Take 20 mg by mouth at bedtime.     multivitamin-lutein Caps  Take 1 capsule by mouth daily.     warfarin 2 MG tablet  Commonly known as:  COUMADIN  Take 1-2 mg by mouth daily. Take 1 mg on Saturday.  Take 2 mg all other days of the week. This regimen started 03/12/13       Social History Social History  . Marital Status: Married   Social History Main Topics  . Smoking status: Former Smoker -- 1.00 packs/day for 30 years    Types: Cigarettes    Quit date: 02/28/1960  . Smokeless tobacco: No  . Alcohol Use: Yes, occasional  . Drug Use: No   Review of Systems General: No chills, fever, night sweats or weight changes  Cardiovascular:  No chest pain, dyspnea on exertion, edema, orthopnea, palpitations, paroxysmal nocturnal dyspnea Dermatological: No rash, lesions or masses Respiratory: No cough, dyspnea Urologic: No hematuria, dysuria Abdominal: No nausea, vomiting, diarrhea, bright red blood per rectum, melena, or hematemesis Neurologic: No visual changes, weakness, changes in mental status All other systems reviewed and are otherwise negative except as noted above.  Physical Exam Vitals: Blood pressure 120/76, pulse 69, temperature 97.7 F (36.5 C), temperature source Oral, resp. rate 18, SpO2 100.00%.  General: Well developed, well appearing 79 y.o. male in no acute distress. HEENT: Normocephalic, atraumatic. EOMs intact. Sclera nonicteric. Oropharynx clear.  Neck: Supple. No JVD. Lungs: Respirations regular and unlabored, CTA bilaterally. No wheezes, rales or  rhonchi. Heart: Regular. S1, S2 present. No murmurs, rub, S3 or S4. Abdomen: Soft, non-distended. Extremities: No clubbing, cyanosis or edema. PT/Radials 2+ and equal bilaterally. Psych: Normal affect. Neuro: Alert and oriented X 3. Moves all extremities spontaneously. Musculoskeletal: No kyphosis. Skin: Left upper chest / explant site intact with 3 sutures in place. Moderate-sized pocket hematoma present. No erythema, warmth or purulent drainage.    Labs Lab Results  Component Value Date   WBC 10.1 03/13/2013  HGB 11.8* 03/13/2013   HCT 33.7* 03/13/2013   MCV 97.4 03/13/2013   PLT 207 03/13/2013    Recent Labs  03/13/13 1300  INR 1.69*   Most recent BMET    Component Value Date/Time   NA 134* 03/03/2013 0515   K 4.2 03/03/2013 0515   CL 101 03/03/2013 0515   CO2 24 03/03/2013 0515   GLUCOSE 132* 03/03/2013 0515   BUN 39* 03/03/2013 0515   CREATININE 1.88* 03/03/2013 0515   CALCIUM 8.4 03/03/2013 0515   GFRNONAA 31* 03/03/2013 0515   GFRAA 36* 03/03/2013 0515    Radiology/Studies None  Assessment and Plan 1. ICD explant site / pocket hematoma 2. ICD pocket infection with erosion s/p ICD system explant and temp-perm pacemaker implant 03/01/2013  Mr. Wyss presents with a pocket hematoma. Dr. Johney Frame was able to evacuate the pocket with manual pressure. Mr. Klaiber tolerated this well. A pressure dressing was then applied which will stay in place until seen by Dr. Ladona Ridgel on Friday, 03/15/2013. Wound care instructions were given and he understands the site cannot get wet until seen in our office later this week for follow-up. His INR is subtherapeutic. There were no changes made to his medications. He is stable for discharge home. He will call us should he develop fever, chills, bleeding or drainage.    Dr. Johney Frame was in to see. Please see recommendations below. Signed, Rick Duff, PA-C 03/13/2013, 3:22 PM   I have seen, examined the patient, and reviewed the above assessment  and plan.  Changes to above are made where necessary.  The patient had moderate sized hematoma from his explant site.  Moderate dried blood was expressed and a pressure dressing was applied.  His temp/perm pacer site was very stable and telemetry revealed appropriate V pacing.  He will continue coumadin.  Keep the pressure dressing in place for 48 hours and follow-up in the device clinic at that time.    Co Sign: Hillis Range, MD 03/13/2013 6:26 PM

## 2013-03-13 NOTE — ED Notes (Signed)
Bleeding controlled.  Subdermal hematoma.

## 2013-03-13 NOTE — Telephone Encounter (Signed)
Patient was instructed to go to ER.

## 2013-03-13 NOTE — Telephone Encounter (Signed)
New prob  Per answering service pt said he is bleeding from his device site.  He would like to speak with someone.

## 2013-03-13 NOTE — ED Notes (Signed)
Pt reports having pacemaker/defib removed by dr Ladona Ridgel on 18th.  Was supposed to have follow up on 2nd.  Pt states he was sitting on couch this morning and felt blood dripping from bandage. Pt is on warfarin and the dose was increased last night.  Pt denies any pain. Pt notified cardiologist that this took place and was told to come to ed.  Pt alert oriented X4

## 2013-03-13 NOTE — ED Provider Notes (Signed)
CSN: [id number]     Arrival date & time 03/13/13  1158 History     First MD Initiated Contact with Patient 03/13/13 1214     Chief Complaint  Patient presents with  . Post-op Problem   (Consider location/radiation/quality/duration/timing/severity/associated sxs/prior Treatment) HPI Comments: [name] is a 79 y.o. Male who presents for evaluation of bleeding from the surgical wound. He had a defibrillator removed on 03/01/13, secondary to "slight erosion". He's not had any complications since the surgery. He has a pacemaker, externally located, attached internally. He denies recent fever, chills, nausea, vomiting, weakness, or dizziness. His Coumadin dosing was modified yesterday for a low INR; he took 2 mg comments that 1 mg. There are no other known modifying factors.   The history is provided by the patient.    Past Medical History  Diagnosis Date  . Atrial fibrillation     Chronic  . Coronary artery disease     Status post CABG. Hx of anterior apical infarct.  . Renal insufficiency     Chronic  . History of carotid endarterectomy     Status post right  . Hypertension   . Hyperlipidemia   . Gout   . CHF (congestive heart failure)     with chronic ischemic cardiomypathy. EF of 10-15%.  CHF due to systolic dysfunction. Clinically doing well.  . Automatic implantable cardioverter-defibrillator in situ   . Anxiety     anxiety  . Diabetes mellitus     Type 2 no med for over 1 yr  . GERD (gastroesophageal reflux disease)   . Cancer     bladder  . Arthritis    Past Surgical History  Procedure Laterality Date  . Tonsillectomy    . Bladder tumor excision    . Nose surgery    . Rib fracture surgery      Left   . US echocardiography  04-17-2009    Est EF 10-15%  . Cardiovascular stress test  04-17-2009    EF 29%  . Tonsillectomy    . Coronary artery bypass graft  04  . Carotid endarterectomy      right      10+ yrs ago     (left side 100percent blocked)  . Icd lead  removal Left 03/01/2013    Procedure: ICD LEAD REMOVAL;  Surgeon: Marinus Maw, MD;  Location: [facility] OR;  Service: Cardiovascular;  Laterality: Left;  . Pacemaker lead removal Left 03/01/2013    Procedure: PACEMAKER LEAD REMOVAL;  Surgeon: Marinus Maw, MD;  Location: [facility] OR;  Service: Cardiovascular;  Laterality: Left;  . Pacemaker insertion N/A 03/01/2013    Procedure: INSERTION PACEMAKER LEAD;  Surgeon: Marinus Maw, MD;  Location: [facility] OR;  Service: Cardiovascular;  Laterality: N/A;   No family history on file. History  Substance Use Topics  . Smoking status: Former Smoker -- 1.00 packs/day for 30 years    Types: Cigarettes    Quit date: 02/28/1960  . Smokeless tobacco: Not on file     Comment: occ alcohol  . Alcohol Use: Yes    Review of Systems  All other systems reviewed and are negative.    Allergies  Penicillins  Home Medications   Current Outpatient Rx  Name  Route  Sig  Dispense  Refill  . allopurinol (ZYLOPRIM) 300 MG tablet   Oral   Take 300 mg by mouth daily.           [name] ALPRAZolam (XANAX) 0.5 MG  tablet   Oral   Take 1 mg by mouth at bedtime as needed for sleep.          [name] aspirin 81 MG tablet   Oral   Take 81 mg by mouth daily.           . calcium-vitamin D (OSCAL WITH D) 500-200 MG-UNIT per tablet   Oral   Take 1 tablet by mouth daily.          . colchicine 0.6 MG tablet   Oral   Take 0.6 mg by mouth daily as needed (for gout).         [name] digoxin (LANOXIN) 0.125 MG tablet   Oral   Take 0.125 mg by mouth daily.         [name] doxycycline (VIBRA-TABS) 100 MG tablet   Oral   Take 100 mg by mouth 2 (two) times daily.         . furosemide (LASIX) 40 MG tablet   Oral   Take 40 mg by mouth every morning.          [name] HYDROcodone-acetaminophen (NORCO/VICODIN) 5-325 MG per tablet   Oral   Take 1 tablet by mouth daily as needed for pain.          [name] lisinopril (PRINIVIL,ZESTRIL) 5 MG tablet   Oral   Take 5 mg by mouth daily.           [name] loratadine (CLARITIN) 10 MG tablet   Oral   Take 10 mg by mouth daily.         [name] lovastatin (MEVACOR) 20 MG tablet   Oral   Take 20 mg by mouth at bedtime.           . multivitamin-lutein (OCUVITE-LUTEIN) CAPS   Oral   Take 1 capsule by mouth daily.         [name] warfarin (COUMADIN) 2 MG tablet   Oral   Take 1-2 mg by mouth daily. Take 1 mg on Saturday.  Take 2 mg all other days of the week. This regimen started 03/12/13          BP 141/64  Pulse 71  Temp(Src) 97.5 F (36.4 C) (Oral)  Resp 18  SpO2 100% Physical Exam  Nursing note and vitals reviewed. Constitutional: He is oriented to person, place, and time. He appears well-developed and well-nourished.  HENT:  Head: Normocephalic and atraumatic.  Right Ear: External ear normal.  Left Ear: External ear normal.  Eyes: Conjunctivae and EOM are normal. Pupils are equal, round, and reactive to light.  Neck: Normal range of motion and phonation normal. Neck supple.  Cardiovascular: Normal rate.   Pulmonary/Chest: Effort normal. He exhibits no bony tenderness.  Surgical wound, left upper chest wall has intact interrupted sutures. Currently, there is no bleeding from the wound edges. There is dried blood on the chest wall, consistent with recent bleeding. Beneath the surgical wound is a palpable fluctuant mass about 5 x 6 x 3 cm, consistent with an approximate 40 cc collection of subcutaneous blood. Pacemaker site through the skin wall appears normal without bleeding or signs of infection.  Abdominal: Normal appearance.  Musculoskeletal: Normal range of motion.  Neurological: He is alert and oriented to person, place, and time. He has normal strength. No cranial nerve deficit or sensory deficit. He exhibits normal muscle tone. Coordination normal.  Skin: Skin is warm, dry and intact.  Psychiatric: He has a normal mood and affect. His behavior is  normal. Judgment and thought content normal.    ED Course   Procedures  (including critical care time)  2:01PM-Consult complete with Dr. Ladona Ridgel. Patient case explained and discussed. He agrees to see patient for further evaluation and treatment. Call ended at 1420  Patient Vitals for the past 24 hrs:  BP Temp Temp src Pulse Resp SpO2  03/13/13 1529 141/64 mmHg 97.5 F (36.4 C) Oral 71 18 100 %  03/13/13 1216 120/76 mmHg 97.7 F (36.5 C) Oral 69 18 100 %   1600- Dr. Johney Frame, came to the ED, and manipulated the subcutaneous fluid pocket, and reduced the hematoma. He then placed a pressure bandage on the surgical wound. He stated that the patient could be managed in the home setting and followup as regularly scheduled, in 2 days. He recommends continuing Coumadin anticoagulation.  Labs Reviewed  CBC WITH DIFFERENTIAL - Abnormal; Notable for the following:    RBC 3.46 (*)    Hemoglobin 11.8 (*)    HCT 33.7 (*)    MCH 34.1 (*)    Eosinophils Relative 6 (*)    All other components within normal limits  PROTIME-INR - Abnormal; Notable for the following:    Prothrombin Time 19.4 (*)    INR 1.69 (*)    All other components within normal limits    1. Bleeding     MDM  Bleeding postoperatively secondary to subcutaneous hematoma. The patient is anticoagulated.Doubt metabolic instability, serious bacterial infection or impending vascular collapse; the patient is stable for discharge.    Nursing Notes Reviewed/ Care Coordinated, and agree without changes. Applicable Imaging Reviewed.  Interpretation of Laboratory Data incorporated into ED treatment    Plan: Home Medications-  usual ; Home Treatments and Observation- Rest; return here if the recommended treatment, does not improve the symptoms; Recommended follow up- Cards in 2 days as scheduled    Flint Melter, MD 03/13/13 1630

## 2013-03-14 ENCOUNTER — Other Ambulatory Visit: Payer: Self-pay | Admitting: Cardiology

## 2013-03-14 NOTE — Telephone Encounter (Signed)
Spoke to pt to see how often he is taking his Lasix and he states for the last 3-4 days now he has been taking it QD and not BID. I informed him that his mail order is trying to request his Lasix and per pt he does not need more right now. Per pt he is ok with office refusing medication refill for right now due to him having a lot of his Lasix right now.

## 2013-03-14 NOTE — Telephone Encounter (Signed)
Pt's spouse, [name], requesting to loosen new pocket dressing from ER yesterday due to pt pain near collar bone.  I instructed her to leave the bandages as they were placed to avoid potential further complication.  She has appt tomorrow w/ Dr. Ladona Ridgel @ 3:15pm.  Estella Husk

## 2013-03-15 ENCOUNTER — Encounter: Payer: Self-pay | Admitting: *Deleted

## 2013-03-15 ENCOUNTER — Ambulatory Visit (INDEPENDENT_AMBULATORY_CARE_PROVIDER_SITE_OTHER): Payer: Medicare Other | Admitting: Internal Medicine

## 2013-03-15 ENCOUNTER — Other Ambulatory Visit: Payer: Self-pay | Admitting: *Deleted

## 2013-03-15 ENCOUNTER — Encounter: Payer: Self-pay | Admitting: Internal Medicine

## 2013-03-15 VITALS — BP 104/65 | HR 95 | Ht 68.0 in | Wt 118.0 lb

## 2013-03-15 DIAGNOSIS — I4891 Unspecified atrial fibrillation: Secondary | ICD-10-CM

## 2013-03-15 DIAGNOSIS — I509 Heart failure, unspecified: Secondary | ICD-10-CM

## 2013-03-15 DIAGNOSIS — I5040 Unspecified combined systolic (congestive) and diastolic (congestive) heart failure: Secondary | ICD-10-CM

## 2013-03-15 DIAGNOSIS — I10 Essential (primary) hypertension: Secondary | ICD-10-CM

## 2013-03-15 DIAGNOSIS — T827XXS Infection and inflammatory reaction due to other cardiac and vascular devices, implants and grafts, sequela: Secondary | ICD-10-CM

## 2013-03-15 NOTE — Assessment & Plan Note (Signed)
He has a history of severe left ventricular dysfunction. His heart failure symptoms are class 3. He will undergo insertion of a Bi ventricular pacemaker.

## 2013-03-15 NOTE — Assessment & Plan Note (Signed)
His blood pressure is well controlled. No change in medical therapy. 

## 2013-03-15 NOTE — Assessment & Plan Note (Signed)
He is status post extraction of his dual-chamber ICD and insertion of a temporary transvenous pacemaker. I have discussed the treatment options with the patient. The risks, goals, benefits, and expectations of biventricular pacemaker insertion have been discussed with the patient and he wishes to proceed.

## 2013-03-15 NOTE — Progress Notes (Signed)
HPI Mr. Schiffer returns today for followup. He is a very pleasant 88 room and with an ischemic cardiomyopathy and complete heart block, who suddenly found to have ICD pocket infection after generator change out 2 years before. He has undergone ICD system extraction and insertion of a temporary permanent transvenous pacemaker. He developed a pocket hematoma and underwent evacuation with manual pressure several days ago. A very tight pressure dressing was placed at that time. He denies fevers or chills. No syncope. He notes weight loss but denies shortness of breath or chest pain. Allergies  Allergen Reactions  . Penicillins Hives and Itching     Current Outpatient Prescriptions  Medication Sig Dispense Refill  . allopurinol (ZYLOPRIM) 300 MG tablet Take 300 mg by mouth daily.        . ALPRAZolam (XANAX) 0.5 MG tablet Take 1 mg by mouth at bedtime as needed for sleep.       . aspirin 81 MG tablet Take 81 mg by mouth daily.        . calcium-vitamin D (OSCAL WITH D) 500-200 MG-UNIT per tablet Take 1 tablet by mouth daily.       . colchicine 0.6 MG tablet Take 0.6 mg by mouth daily as needed (for gout).      . digoxin (LANOXIN) 0.125 MG tablet Take 0.125 mg by mouth daily.      . doxycycline (VIBRA-TABS) 100 MG tablet Take 100 mg by mouth 2 (two) times daily.      . furosemide (LASIX) 40 MG tablet Take 40 mg by mouth every morning.       . HYDROcodone-acetaminophen (NORCO/VICODIN) 5-325 MG per tablet Take 1 tablet by mouth daily as needed for pain.       . lisinopril (PRINIVIL,ZESTRIL) 5 MG tablet Take 5 mg by mouth daily.       . loratadine (CLARITIN) 10 MG tablet Take 10 mg by mouth as needed.       . lovastatin (MEVACOR) 20 MG tablet Take 20 mg by mouth at bedtime.        . multivitamin-lutein (OCUVITE-LUTEIN) CAPS Take 1 capsule by mouth daily.      . warfarin (COUMADIN) 2 MG tablet Take 1-2 mg by mouth daily. Take 1 mg on Saturday.  Take 2 mg all other days of the week. This regimen started  03/12/13       No current facility-administered medications for this visit.     Past Medical History  Diagnosis Date  . Atrial fibrillation     Chronic  . Coronary artery disease     Status post CABG. Hx of anterior apical infarct.  . Renal insufficiency     Chronic  . History of carotid endarterectomy     Status post right  . Hypertension   . Hyperlipidemia   . Gout   . CHF (congestive heart failure)     with chronic ischemic cardiomypathy. EF of 10-15%.  CHF due to systolic dysfunction. Clinically doing well.  . Automatic implantable cardioverter-defibrillator in situ   . Anxiety     anxiety  . Diabetes mellitus     Type 2 no med for over 1 yr  . GERD (gastroesophageal reflux disease)   . Cancer     bladder  . Arthritis     ROS:   All systems reviewed and negative except as noted in the HPI.   Past Surgical History  Procedure Laterality Date  . Tonsillectomy    . Bladder tumor excision    .   Nose surgery    . Rib fracture surgery      Left   . Us echocardiography  04-17-2009    Est EF 10-15%  . Cardiovascular stress test  04-17-2009    EF 29%  . Tonsillectomy    . Coronary artery bypass graft  04  . Carotid endarterectomy      right      10+ yrs ago     (left side 100percent blocked)  . Icd lead removal Left 03/01/2013    Procedure: ICD LEAD REMOVAL;  Surgeon: Davaun Quintela W Araya Roel, MD;  Location: [facility] OR;  Service: Cardiovascular;  Laterality: Left;  . Pacemaker lead removal Left 03/01/2013    Procedure: PACEMAKER LEAD REMOVAL;  Surgeon: Chaslyn Eisen W Bee Marchiano, MD;  Location: [facility] OR;  Service: Cardiovascular;  Laterality: Left;  . Pacemaker insertion N/A 03/01/2013    Procedure: INSERTION PACEMAKER LEAD;  Surgeon: Roman Sandall W Ilisa Hayworth, MD;  Location: [facility] OR;  Service: Cardiovascular;  Laterality: N/A;     History reviewed. No pertinent family history.   History   Social History  . Marital Status: Married    Spouse Name: N/A    Number of Children: 1  . Years of Education: N/A    Occupational History  .     Social History Main Topics  . Smoking status: Former Smoker -- 1.00 packs/day for 30 years    Types: Cigarettes    Quit date: 02/28/1960  . Smokeless tobacco: Not on file     Comment: occ alcohol  . Alcohol Use: Yes  . Drug Use: No  . Sexually Active: Not on file   Other Topics Concern  . Not on file   Social History Narrative  . No narrative on file     BP 104/65  Pulse 95  Ht 5' 8" (1.727 m)  Wt 118 lb (53.524 kg)  BMI 17.95 kg/m2  SpO2 99%  Physical Exam:  Well appearing elderly man,  NAD HEENT: Unremarkable Neck:  7 cm JVD, no thyromegally Lungs:  Clear except for basilar rales. Minimal pocket hematoma persists. There is no erythema or drainage. HEART:  Regular rate rhythm, no murmurs, no rubs, no clicks Abd:  soft, positive bowel sounds, no organomegally, no rebound, no guarding Ext:  2 plus pulses, no edema, no cyanosis, no clubbing Skin:  No rashes no nodules Neuro:  CN II through XII intact, motor grossly intact   Assess/Plan: 

## 2013-03-15 NOTE — Patient Instructions (Addendum)
Will replace pacemaker on Wed  See instruction sheet

## 2013-03-18 ENCOUNTER — Encounter (HOSPITAL_COMMUNITY): Payer: Self-pay | Admitting: Pharmacy Technician

## 2013-03-19 MED ORDER — VANCOMYCIN HCL IN DEXTROSE 1-5 GM/200ML-% IV SOLN
1000.0000 mg | INTRAVENOUS | Status: DC
  Filled 2013-03-19: qty 200

## 2013-03-19 MED ORDER — SODIUM CHLORIDE 0.9 % IR SOLN
80.0000 mg | Status: DC
  Filled 2013-03-19: qty 2

## 2013-03-20 ENCOUNTER — Encounter (HOSPITAL_COMMUNITY): Payer: Self-pay | Admitting: General Practice

## 2013-03-20 ENCOUNTER — Ambulatory Visit (HOSPITAL_COMMUNITY)
Admission: RE | Admit: 2013-03-20 | Discharge: 2013-03-22 | Disposition: A | Discharge status: Home / Self Care | Payer: Medicare Other | Source: Ambulatory Visit | Attending: Internal Medicine | Admitting: Internal Medicine

## 2013-03-20 ENCOUNTER — Encounter (HOSPITAL_COMMUNITY)
Admission: RE | Disposition: A | Discharge status: Home / Self Care | Payer: Self-pay | Source: Ambulatory Visit | Attending: Internal Medicine

## 2013-03-20 DIAGNOSIS — Z7901 Long term (current) use of anticoagulants: Secondary | ICD-10-CM | POA: Insufficient documentation

## 2013-03-20 DIAGNOSIS — I2589 Other forms of chronic ischemic heart disease: Secondary | ICD-10-CM | POA: Insufficient documentation

## 2013-03-20 DIAGNOSIS — I442 Atrioventricular block, complete: Secondary | ICD-10-CM

## 2013-03-20 DIAGNOSIS — I9589 Other hypotension: Secondary | ICD-10-CM | POA: Insufficient documentation

## 2013-03-20 DIAGNOSIS — I5022 Chronic systolic (congestive) heart failure: Secondary | ICD-10-CM

## 2013-03-20 DIAGNOSIS — I509 Heart failure, unspecified: Secondary | ICD-10-CM | POA: Insufficient documentation

## 2013-03-20 DIAGNOSIS — I4891 Unspecified atrial fibrillation: Secondary | ICD-10-CM | POA: Insufficient documentation

## 2013-03-20 DIAGNOSIS — Z79899 Other long term (current) drug therapy: Secondary | ICD-10-CM | POA: Insufficient documentation

## 2013-03-20 DIAGNOSIS — M25519 Pain in unspecified shoulder: Secondary | ICD-10-CM | POA: Insufficient documentation

## 2013-03-20 DIAGNOSIS — I2510 Atherosclerotic heart disease of native coronary artery without angina pectoris: Secondary | ICD-10-CM | POA: Insufficient documentation

## 2013-03-20 DIAGNOSIS — E119 Type 2 diabetes mellitus without complications: Secondary | ICD-10-CM | POA: Insufficient documentation

## 2013-03-20 DIAGNOSIS — Y831 Surgical operation with implant of artificial internal device as the cause of abnormal reaction of the patient, or of later complication, without mention of misadventure at the time of the procedure: Secondary | ICD-10-CM | POA: Insufficient documentation

## 2013-03-20 DIAGNOSIS — I447 Left bundle-branch block, unspecified: Secondary | ICD-10-CM | POA: Insufficient documentation

## 2013-03-20 DIAGNOSIS — Z951 Presence of aortocoronary bypass graft: Secondary | ICD-10-CM | POA: Insufficient documentation

## 2013-03-20 DIAGNOSIS — I10 Essential (primary) hypertension: Secondary | ICD-10-CM | POA: Insufficient documentation

## 2013-03-20 DIAGNOSIS — I5040 Unspecified combined systolic (congestive) and diastolic (congestive) heart failure: Secondary | ICD-10-CM

## 2013-03-20 HISTORY — DX: Presence of cardiac pacemaker: Z95.0

## 2013-03-20 HISTORY — DX: Depression, unspecified: F32.A

## 2013-03-20 HISTORY — DX: Malignant neoplasm of bladder, unspecified: C67.9

## 2013-03-20 HISTORY — DX: Major depressive disorder, single episode, unspecified: F32.9

## 2013-03-20 HISTORY — PX: BI-VENTRICULAR PACEMAKER INSERTION: SHX5462

## 2013-03-20 HISTORY — DX: Personal history of other diseases of the digestive system: Z87.19

## 2013-03-20 LAB — PROTIME-INR: Prothrombin Time: 23.3 seconds — ABNORMAL HIGH (ref 11.6–15.2)

## 2013-03-20 SURGERY — BI-VENTRICULAR PACEMAKER INSERTION (CRT-P)
Anesthesia: LOCAL

## 2013-03-20 MED ORDER — FENTANYL CITRATE 0.05 MG/ML IJ SOLN
INTRAMUSCULAR | Status: AC
  Filled 2013-03-20: qty 2

## 2013-03-20 MED ORDER — LIDOCAINE HCL (PF) 1 % IJ SOLN
INTRAMUSCULAR | Status: AC
  Filled 2013-03-20: qty 60

## 2013-03-20 MED ORDER — LORATADINE 10 MG PO TABS
10.0000 mg | ORAL_TABLET | Freq: Every day | ORAL | Status: DC | PRN
  Filled 2013-03-20: qty 1

## 2013-03-20 MED ORDER — ACETAMINOPHEN 325 MG PO TABS
325.0000 mg | ORAL_TABLET | ORAL | Status: DC | PRN
  Administered 2013-03-21 (×2): 650 mg via ORAL
  Filled 2013-03-20 (×2): qty 2

## 2013-03-20 MED ORDER — WARFARIN SODIUM 1 MG PO TABS: 1.0000 mg | ORAL_TABLET | ORAL | Status: DC

## 2013-03-20 MED ORDER — ALLOPURINOL 300 MG PO TABS
300.0000 mg | ORAL_TABLET | Freq: Every day | ORAL | Status: DC
Start: 2013-03-20 — End: 2013-03-22
  Administered 2013-03-21 – 2013-03-22 (×2): 300 mg via ORAL
  Filled 2013-03-20 (×3): qty 1

## 2013-03-20 MED ORDER — VANCOMYCIN HCL IN DEXTROSE 1-5 GM/200ML-% IV SOLN
1000.0000 mg | Freq: Two times a day (BID) | INTRAVENOUS | Status: DC
  Filled 2013-03-20: qty 200

## 2013-03-20 MED ORDER — VANCOMYCIN HCL IN DEXTROSE 1-5 GM/200ML-% IV SOLN
1000.0000 mg | Freq: Two times a day (BID) | INTRAVENOUS | Status: AC
  Administered 2013-03-21: 1000 mg via INTRAVENOUS
  Filled 2013-03-20: qty 200

## 2013-03-20 MED ORDER — ONDANSETRON HCL 4 MG/2ML IJ SOLN: 4.0000 mg | Freq: Four times a day (QID) | INTRAMUSCULAR | Status: DC | PRN

## 2013-03-20 MED ORDER — FUROSEMIDE 40 MG PO TABS
40.0000 mg | ORAL_TABLET | Freq: Every morning | ORAL | Status: DC
  Filled 2013-03-20 (×2): qty 1

## 2013-03-20 MED ORDER — WARFARIN - PHYSICIAN DOSING INPATIENT: Freq: Every day | Status: DC

## 2013-03-20 MED ORDER — ALPRAZOLAM 0.25 MG PO TABS: 1.0000 mg | ORAL_TABLET | Freq: Every evening | ORAL | Status: DC | PRN

## 2013-03-20 MED ORDER — OCUVITE-LUTEIN PO CAPS
1.0000 | ORAL_CAPSULE | Freq: Every day | ORAL | Status: DC
  Administered 2013-03-21: 1 via ORAL
  Filled 2013-03-20 (×3): qty 1

## 2013-03-20 MED ORDER — ACETAMINOPHEN 325 MG PO TABS
325.0000 mg | ORAL_TABLET | ORAL | Status: DC | PRN
  Administered 2013-03-20: 19:00:00 650 mg via ORAL
  Filled 2013-03-20: qty 2

## 2013-03-20 MED ORDER — MIDAZOLAM HCL 5 MG/5ML IJ SOLN
INTRAMUSCULAR | Status: AC
  Filled 2013-03-20: qty 5

## 2013-03-20 MED ORDER — MUPIROCIN 2 % EX OINT
TOPICAL_OINTMENT | Freq: Two times a day (BID) | CUTANEOUS | Status: DC
  Filled 2013-03-20 (×2): qty 22

## 2013-03-20 MED ORDER — COLCHICINE 0.6 MG PO TABS
0.6000 mg | ORAL_TABLET | Freq: Every day | ORAL | Status: DC | PRN
Start: 2013-03-20 — End: 2013-03-22
  Filled 2013-03-20: qty 1

## 2013-03-20 MED ORDER — DIGOXIN 125 MCG PO TABS
0.1250 mg | ORAL_TABLET | Freq: Every day | ORAL | Status: DC
  Administered 2013-03-21 – 2013-03-22 (×2): 0.125 mg via ORAL
  Filled 2013-03-20 (×3): qty 1

## 2013-03-20 MED ORDER — SODIUM CHLORIDE 0.9 % IV SOLN
INTRAVENOUS | Status: DC
  Administered 2013-03-20: 12:00:00 via INTRAVENOUS

## 2013-03-20 MED ORDER — LISINOPRIL 5 MG PO TABS
5.0000 mg | ORAL_TABLET | Freq: Every day | ORAL | Status: DC
  Filled 2013-03-20 (×3): qty 1

## 2013-03-20 MED ORDER — YOU HAVE A PACEMAKER BOOK
Freq: Once | Status: AC
  Administered 2013-03-20: 22:00:00
  Filled 2013-03-20: qty 1

## 2013-03-20 MED ORDER — WARFARIN SODIUM 2 MG PO TABS
2.0000 mg | ORAL_TABLET | ORAL | Status: DC
  Administered 2013-03-21: 2 mg via ORAL
  Filled 2013-03-20 (×2): qty 1

## 2013-03-20 MED ORDER — CALCIUM CARBONATE-VITAMIN D 500-200 MG-UNIT PO TABS
1.0000 | ORAL_TABLET | Freq: Every day | ORAL | Status: DC
  Administered 2013-03-21: 1 via ORAL
  Filled 2013-03-20 (×3): qty 1

## 2013-03-20 NOTE — Interval H&P Note (Signed)
History and Physical Interval Note:  03/20/2013 12:42 PM  [name]  has presented today for surgery, with the diagnosis of Afib  The various methods of treatment have been discussed with the patient and family. After consideration of risks, benefits and other options for treatment, the patient has consented to  Procedure(s): BI-VENTRICULAR PACEMAKER INSERTION (CRT-P) (N/A) as a surgical intervention .  The patient's history has been reviewed, patient examined, no change in status, stable for surgery.  I have reviewed the patient's chart and labs.  Questions were answered to the patient's satisfaction.     Leonia Reeves.D.

## 2013-03-20 NOTE — H&P (View-Only) (Signed)
HPI [name] returns today for followup. He is a very pleasant 88 room and with an ischemic cardiomyopathy and complete heart block, who suddenly found to have ICD pocket infection after generator change out 2 years before. He has undergone ICD system extraction and insertion of a temporary permanent transvenous pacemaker. He developed a pocket hematoma and underwent evacuation with manual pressure several days ago. A very tight pressure dressing was placed at that time. He denies fevers or chills. No syncope. He notes weight loss but denies shortness of breath or chest pain. Allergies  Allergen Reactions  . Penicillins Hives and Itching     Current Outpatient Prescriptions  Medication Sig Dispense Refill  . allopurinol (ZYLOPRIM) 300 MG tablet Take 300 mg by mouth daily.        [name] ALPRAZolam (XANAX) 0.5 MG tablet Take 1 mg by mouth at bedtime as needed for sleep.       [name] aspirin 81 MG tablet Take 81 mg by mouth daily.        . calcium-vitamin D (OSCAL WITH D) 500-200 MG-UNIT per tablet Take 1 tablet by mouth daily.       . colchicine 0.6 MG tablet Take 0.6 mg by mouth daily as needed (for gout).      [name] digoxin (LANOXIN) 0.125 MG tablet Take 0.125 mg by mouth daily.      [name] doxycycline (VIBRA-TABS) 100 MG tablet Take 100 mg by mouth 2 (two) times daily.      . furosemide (LASIX) 40 MG tablet Take 40 mg by mouth every morning.       [name] HYDROcodone-acetaminophen (NORCO/VICODIN) 5-325 MG per tablet Take 1 tablet by mouth daily as needed for pain.       [name] lisinopril (PRINIVIL,ZESTRIL) 5 MG tablet Take 5 mg by mouth daily.       [name] loratadine (CLARITIN) 10 MG tablet Take 10 mg by mouth as needed.       . lovastatin (MEVACOR) 20 MG tablet Take 20 mg by mouth at bedtime.        . multivitamin-lutein (OCUVITE-LUTEIN) CAPS Take 1 capsule by mouth daily.      [name] warfarin (COUMADIN) 2 MG tablet Take 1-2 mg by mouth daily. Take 1 mg on Saturday.  Take 2 mg all other days of the week. This regimen started  03/12/13       No current facility-administered medications for this visit.     Past Medical History  Diagnosis Date  . Atrial fibrillation     Chronic  . Coronary artery disease     Status post CABG. Hx of anterior apical infarct.  . Renal insufficiency     Chronic  . History of carotid endarterectomy     Status post right  . Hypertension   . Hyperlipidemia   . Gout   . CHF (congestive heart failure)     with chronic ischemic cardiomypathy. EF of 10-15%.  CHF due to systolic dysfunction. Clinically doing well.  . Automatic implantable cardioverter-defibrillator in situ   . Anxiety     anxiety  . Diabetes mellitus     Type 2 no med for over 1 yr  . GERD (gastroesophageal reflux disease)   . Cancer     bladder  . Arthritis     ROS:   All systems reviewed and negative except as noted in the HPI.   Past Surgical History  Procedure Laterality Date  . Tonsillectomy    . Bladder tumor excision    .  Nose surgery    . Rib fracture surgery      Left   . US echocardiography  04-17-2009    Est EF 10-15%  . Cardiovascular stress test  04-17-2009    EF 29%  . Tonsillectomy    . Coronary artery bypass graft  04  . Carotid endarterectomy      right      10+ yrs ago     (left side 100percent blocked)  . Icd lead removal Left 03/01/2013    Procedure: ICD LEAD REMOVAL;  Surgeon: Marinus Maw, MD;  Location: [facility] OR;  Service: Cardiovascular;  Laterality: Left;  . Pacemaker lead removal Left 03/01/2013    Procedure: PACEMAKER LEAD REMOVAL;  Surgeon: Marinus Maw, MD;  Location: [facility] OR;  Service: Cardiovascular;  Laterality: Left;  . Pacemaker insertion N/A 03/01/2013    Procedure: INSERTION PACEMAKER LEAD;  Surgeon: Marinus Maw, MD;  Location: [facility] OR;  Service: Cardiovascular;  Laterality: N/A;     History reviewed. No pertinent family history.   History   Social History  . Marital Status: Married    Spouse Name: N/A    Number of Children: 1  . Years of Education: N/A    Occupational History  .     Social History Main Topics  . Smoking status: Former Smoker -- 1.00 packs/day for 30 years    Types: Cigarettes    Quit date: 02/28/1960  . Smokeless tobacco: Not on file     Comment: occ alcohol  . Alcohol Use: Yes  . Drug Use: No  . Sexually Active: Not on file   Other Topics Concern  . Not on file   Social History Narrative  . No narrative on file     BP 104/65  Pulse 95  Ht 5\' 8"  (1.727 m)  Wt 118 lb (53.524 kg)  BMI 17.95 kg/m2  SpO2 99%  Physical Exam:  Well appearing elderly man,  NAD HEENT: Unremarkable Neck:  7 cm JVD, no thyromegally Lungs:  Clear except for basilar rales. Minimal pocket hematoma persists. There is no erythema or drainage. HEART:  Regular rate rhythm, no murmurs, no rubs, no clicks Abd:  soft, positive bowel sounds, no organomegally, no rebound, no guarding Ext:  2 plus pulses, no edema, no cyanosis, no clubbing Skin:  No rashes no nodules Neuro:  CN II through XII intact, motor grossly intact   Assess/Plan:

## 2013-03-20 NOTE — CV Procedure (Signed)
BiV PPM insertion via the right cephalic vein without immediate complication. [id number].

## 2013-03-20 NOTE — Discharge Summary (Addendum)
ELECTROPHYSIOLOGY PROCEDURE DISCHARGE SUMMARY    Patient ID: [name],  MRN: [id number], DOB/AGE: 1925-07-30 79 y.o.  Admit date: 03/20/2013 Discharge date: 03/22/2013  Primary Care Physician: Jarome Matin, MD Primary Cardiologist: Peter Swaziland, MD Electrophysiologist: Sherryl Manges, MD  Primary Discharge Diagnosis:  Ischemic cardiomyopathy and congestive heart failure status post CRT-P implantation this admission  Secondary Discharge Diagnosis:  1.  Previously implanted ICD with pocket erosion and infection status post extraction July 2014 2.  Atrial fibrillation 3.  CAD s/p CABG 4.  Diabetes 5.  Hypertension  Procedures This Admission:  1.  Implantation of a cardiac resynchronization therapy pacemaker on 03-20-2013 by Dr Ladona Ridgel.  See op note for full details.  There were no early apparent complications. 2.  CXR on 03-21-2013 demonstrated no PTX   Brief HPI: [name] is a 77 room and with an ischemic cardiomyopathy and complete heart block, who suddenly found to have ICD pocket infection after generator change out 2 years before. He has undergone ICD system extraction and insertion of a temporary permanent transvenous pacemaker. He developed a pocket hematoma and underwent evacuation with manual pressure several days ago.  Risks, benefits, and alternatives to pacemaker implantation were reviewed with the patient who wished to proceed.   Hospital Course:  The patient was admitted and underwent implantation of a CRTP with details as outlined above.   He was monitored on telemetry overnight which demonstrated atrial fib with BiV Pacing.  Right chest was without hematoma or ecchymosis.  The device was interrogated and found to be functioning normally.  CXR was obtained and demonstrated no pneumothorax status post device implantation.  Wound care, arm mobility, and restrictions were reviewed with the patient. He had significant right shoulder pain the day following discharge as well as  hypotension.  He was evaluated with echocardiogram which demonstrated no effusion.  CBC was stable.  He was monitored for one additional day. On the day of discharge, his pain is much improved.  He has been ambulating without difficulty.  Dr Ladona Ridgel examined the patient and considered them stable for discharge to home.    Discharge Vitals: Blood pressure 136/47, pulse 64, temperature 98.2 F (36.8 C), temperature source Oral, resp. rate 18, height 5\' 8"  (1.727 m), weight 118 lb 9.7 oz (53.8 kg), SpO2 99.00%.   Labs:   Lab Results  Component Value Date   WBC 15.7* 03/21/2013   HGB 10.2* 03/21/2013   HCT 30.0* 03/21/2013   MCV 97.1 03/21/2013   PLT 145* 03/21/2013     Discharge Medications:    Medication List    ASK your doctor about these medications       allopurinol 300 MG tablet  Commonly known as:  ZYLOPRIM  Take 300 mg by mouth daily.     ALPRAZolam 0.5 MG tablet  Commonly known as:  XANAX  Take 1 mg by mouth at bedtime as needed for sleep.     aspirin 81 MG tablet  Take 81 mg by mouth daily.     calcium-vitamin D 500-200 MG-UNIT per tablet  Commonly known as:  OSCAL WITH D  Take 1 tablet by mouth daily.     colchicine 0.6 MG tablet  Take 0.6 mg by mouth daily as needed (for gout).     digoxin 0.125 MG tablet  Commonly known as:  LANOXIN  Take 0.125 mg by mouth daily.     furosemide 40 MG tablet  Commonly known as:  LASIX  Take 40 mg  by mouth every morning.     HYDROcodone-acetaminophen 5-325 MG per tablet  Commonly known as:  NORCO/VICODIN  Take 1 tablet by mouth daily as needed for pain.     lisinopril 5 MG tablet  Commonly known as:  PRINIVIL,ZESTRIL  Take 5 mg by mouth daily.     loratadine 10 MG tablet  Commonly known as:  CLARITIN  Take 10 mg by mouth daily as needed for allergies.     lovastatin 20 MG tablet  Commonly known as:  MEVACOR  Take 20 mg by mouth at bedtime.     multivitamin-lutein Caps capsule  Take 1 capsule by mouth daily.     warfarin  2 MG tablet  Commonly known as:  COUMADIN  Take 1-2 mg by mouth daily. Take 1 mg on Saturday.  Take 2 mg all other days of the week. This regimen started 03/12/13        Disposition:   Future Appointments Provider Department Dept Phone   03/25/2013 4:00 PM Duke Salvia, MD [facility] Main Office [place]) [phone]       Duration of Discharge Encounter: Greater than 30 minutes including physician time.  Signed, Gypsy Balsam, RN, BSN 03/22/2013, 6:34 AM  EP Attending  Patient seen and examined, agree with a bove.  Leonia Reeves.D.

## 2013-03-21 ENCOUNTER — Ambulatory Visit (HOSPITAL_COMMUNITY): Payer: Medicare Other

## 2013-03-21 DIAGNOSIS — I369 Nonrheumatic tricuspid valve disorder, unspecified: Secondary | ICD-10-CM

## 2013-03-21 DIAGNOSIS — I959 Hypotension, unspecified: Secondary | ICD-10-CM

## 2013-03-21 DIAGNOSIS — I5022 Chronic systolic (congestive) heart failure: Secondary | ICD-10-CM

## 2013-03-21 DIAGNOSIS — Z950 Presence of cardiac pacemaker: Secondary | ICD-10-CM

## 2013-03-21 LAB — CBC WITH DIFFERENTIAL/PLATELET
Eosinophils Absolute: 0.1 10*3/uL (ref 0.0–0.7)
Eosinophils Relative: 1 % (ref 0–5)
HCT: 30 % — ABNORMAL LOW (ref 39.0–52.0)
Hemoglobin: 10.2 g/dL — ABNORMAL LOW (ref 13.0–17.0)
Lymphs Abs: 0.7 10*3/uL (ref 0.7–4.0)
MCH: 33 pg (ref 26.0–34.0)
MCHC: 34 g/dL (ref 30.0–36.0)
MCV: 97.1 fL (ref 78.0–100.0)
Monocytes Absolute: 0.7 10*3/uL (ref 0.1–1.0)
Monocytes Relative: 4 % (ref 3–12)
Neutrophils Relative %: 91 % — ABNORMAL HIGH (ref 43–77)
RBC: 3.09 MIL/uL — ABNORMAL LOW (ref 4.22–5.81)

## 2013-03-21 LAB — GLUCOSE, CAPILLARY
Glucose-Capillary: 148 mg/dL — ABNORMAL HIGH (ref 70–99)
Glucose-Capillary: 232 mg/dL — ABNORMAL HIGH (ref 70–99)

## 2013-03-21 MED ORDER — SODIUM CHLORIDE 0.9 % IV BOLUS (SEPSIS): 250.0000 mL | Freq: Once | INTRAVENOUS | Status: DC

## 2013-03-21 MED ORDER — HYDROCODONE-ACETAMINOPHEN 5-325 MG PO TABS
1.0000 | ORAL_TABLET | Freq: Once | ORAL | Status: DC
  Filled 2013-03-21: qty 1

## 2013-03-21 MED ORDER — TRAMADOL HCL 50 MG PO TABS
50.0000 mg | ORAL_TABLET | Freq: Four times a day (QID) | ORAL | Status: DC | PRN
  Administered 2013-03-21 – 2013-03-22 (×2): 50 mg via ORAL
  Filled 2013-03-21 (×2): qty 1

## 2013-03-21 MED ORDER — SODIUM CHLORIDE 0.9 % IV BOLUS (SEPSIS)
250.0000 mL | Freq: Once | INTRAVENOUS | Status: AC
  Administered 2013-03-21: 09:00:00 250 mL via INTRAVENOUS

## 2013-03-21 MED ORDER — TRAMADOL HCL 50 MG PO TABS
50.0000 mg | ORAL_TABLET | Freq: Two times a day (BID) | ORAL | Status: DC
  Administered 2013-03-21: 50 mg via ORAL
  Filled 2013-03-21 (×2): qty 1

## 2013-03-21 MED ORDER — TRAMADOL HCL 50 MG PO TABS: 50.0000 mg | ORAL_TABLET | Freq: Two times a day (BID) | ORAL | Status: DC

## 2013-03-21 NOTE — Progress Notes (Signed)
   ELECTROPHYSIOLOGY ROUNDING NOTE    Patient Name: [name] Date of Encounter: 03/21/2013    SUBJECTIVE:Patient with significant right shoulder pain last night and this morning.  Also hypotensive last night and required fluid bolus.   TELEMETRY: Reviewed telemetry pt in atrial fibrillation with ventricular pacing Filed Vitals:   03/21/13 0005 03/21/13 0330 03/21/13 0335 03/21/13 0400  BP: 98/38 82/37 81/36  100/37  Pulse: 60 60 61   Temp: 98.4 F (36.9 C) 98.9 F (37.2 C)    TempSrc: Oral Oral    Resp: 15 18 18 18   Height: 5\' 8"  (1.727 m)     Weight: 117 lb 1 oz (53.1 kg)     SpO2: 100% 99% 99% 99%    Intake/Output Summary (Last 24 hours) at 03/21/13 1478 Last data filed at 03/21/13 0330  Gross per 24 hour  Intake    370 ml  Output    900 ml  Net   -530 ml    CURRENT MEDICATIONS: . allopurinol  300 mg Oral Daily  . calcium-vitamin D  1 tablet Oral Daily  . digoxin  0.125 mg Oral Daily  . furosemide  40 mg Oral q morning - 10a  . HYDROcodone-acetaminophen  1 tablet Oral Once  . lisinopril  5 mg Oral Daily  . multivitamin-lutein  1 capsule Oral Daily  . mupirocin ointment   Nasal BID  . sodium chloride  250 mL Intravenous Once  . [START ON 03/23/2013] warfarin  1 mg Oral Q Sat-1800  . warfarin  2 mg Oral Custom  . Warfarin - Physician Dosing Inpatient   Does not apply q1800    Radiology/Studies:  Pending  PHYSICAL EXAM Elderly appearing man, NAD CV - RRR Lungs - clear with no wheezes Abd- scaphoid, NTND Ext - no edema. Incision - bandage in place  DEVICE INTERROGATION: Device interrogation - normal BiV PPM function  A/P 1. ICD pocket infection s/p extraction and temp PM insertion 3 weeks ago 2. Chronic systolic CHF 3. Pacing induced LBBB 4. S/p BiV PPM insertion 5. Post op hypotension, no obvious problem on CXR Rec: ok for later discharge today. Will see how he tolerates food, ambulation, and pain control. He will need his stitches removed from  contralateral side prior to dc.  Lewayne Bunting, M.D.

## 2013-03-21 NOTE — Progress Notes (Signed)
  Echocardiogram 2D Echocardiogram has been performed.  Georgian Co 03/21/2013, 9:56 AM

## 2013-03-21 NOTE — Progress Notes (Signed)
At approximately 3:30 am pt c/o of Right shoulder blade pain. Pt. Repositioned. Checked BP pt noted with low BP=82/37.  Hydrocodone order on hold for low BP. Dr. Shirlee Latch notified. NS 250 ml bolus given as ordered. Rechecked  BP= 100/37 after NS bolus. Tylenol 650 mg given for pain as ordered. Will continue to monitor pt.

## 2013-03-21 NOTE — Op Note (Signed)
NAMESINJIN, AMERO NO.:  [id number]  MEDICAL RECORD NO.:  [id number]  LOCATION:  6C04C                        FACILITY:  [facility]  PHYSICIAN:  Doylene Canning. Ladona Ridgel, MD    DATE OF BIRTH:  1925/02/13  DATE OF PROCEDURE:  03/20/2013 DATE OF DISCHARGE:                              OPERATIVE REPORT   PROCEDURE PERFORMED:  Insertion of a biventricular pacemaker.  INTRODUCTION:  The patient is an 79 year old man with a longstanding ischemic cardiomyopathy, complete heart block, and atrial fibrillation. He developed erosion of his ICD on the left side and underwent extraction several weeks ago.  He had a small hematoma following the procedure.  He has done well since then and now presents for insertion of a biventricular pacemaker.  Of note, the patient has a temporary permanent pacemaker by way of the left subclavian vein previously implanted.  PROCEDURE:  After informed consent was obtained, the patient was taken to the diagnostic EP lab in a fasting state.  After usual preparation and draping, intravenous fentanyl and midazolam was given for sedation. 30 mL of lidocaine was infiltrated into the right infraclavicular region and a 7-cm incision was carried out over this region.  Electrocautery was utilized to dissect down to the fascial plane.  Initial attempts to puncture the right subclavian vein were successful, but a guidewire could not be advanced.  Venography of the vein was then carried out demonstrating that it was subtotally occluded.  At this point, we punctured the vein more proximally and a Glidewire still could not be advanced into the vein.  Finally, the cephalic vein was isolated and a Glidewire was advanced by way of the cephalic vein into the central circulation x2.  First the Medtronic model 5076, 52 cm active fixation pacing lead, serial number [id number] was advanced into the right ventricle.  Mapping was carried out at the final site.  The paced  R- waves were 5 mV, the pacing impedance with the lead actively fixed was 700 ohms, and threshold 0.6 V at 0.5 milliseconds.  10 V pacing did not stimulate the diaphragm.  There was injury current present.  With these satisfactory parameters, attention was then turned to placement of the left ventricular lead.  The 9-French sheath could not be advanced because of not enough space in the vein, but a 7-French guiding catheter was advanced into the subclavian vein by way of the cephalic vein and then on into the right atrium.  The coronary sinus was cannulated with a 6-French hexapolar EP catheter, and venography of the coronary sinus was carried out.  This demonstrated small lateral vein, which was selected for LV lead placement.  The Medtronic model 4296, 88 cm bipolar pacing lead was advanced by way of the guiding catheter into the coronary sinus and out into the lateral vein approximately 1/3rd from base to apex.  In this location, the pace impedance was 1100 ohms, threshold 2 V at 0.5 milliseconds, and 10 V pacing did not stimulate the diaphragm.  With these satisfactory parameters, the left ventricular lead was liberated from the guiding catheter in the usual manner, and remained stable.  The right ventricular lead was  re-adjusted slightly to find a more suitable position and this was carried out successfully.  It should be noted that initially the right ventricular lead moved slightly.  At this point, a pocket was irrigated and figure-of-eight silk suture was utilized to secure the leads and the sewing sleeves were secured with silk suture. Electrocautery was utilized to make subcutaneous pocket.  Antibiotic irrigation was utilized to irrigate the pocket.  Electrocautery was utilized to make a subcutaneous pocket.  The Medtronic biventricular pacemaker, serial number [id number] was connected to the RV and LV leads.  The atrial lead was capped.  The device was placed back in  the subcutaneous pocket.  The pocket was irrigated with additional antibiotic irrigation.  The incision was closed with 2-0 and 3-0 Vicryl. Benzoin and Steri-Strips were painted on the skin.  A pressure dressing was applied.  The patient was returned to the recovery area in satisfactory condition.  COMPLICATIONS:  There were no immediate procedure complications.  RESULTS:  This demonstrates successful implantation of a biventricular pacemaker in a patient with an ischemic cardiomyopathy, complete heart block, chronic atrial fibrillation, and chronic systolic heart failure class III with pacing induced left bundle-branch block, QRS duration 180 milliseconds.     Doylene Canning. Ladona Ridgel, MD     GWT/MEDQ  D:  03/20/2013  T:  03/21/2013  Job:  [id number]  cc:   Duke Salvia, MD, [facility]

## 2013-03-22 DIAGNOSIS — I5022 Chronic systolic (congestive) heart failure: Secondary | ICD-10-CM

## 2013-03-22 MED ORDER — TRAMADOL HCL 50 MG PO TABS: 50.0000 mg | ORAL_TABLET | Freq: Four times a day (QID) | ORAL | Status: DC | PRN

## 2013-03-22 NOTE — Progress Notes (Signed)
ELECTROPHYSIOLOGY ROUNDING NOTE    Patient Name: KORDELL JAFRI Date of Encounter: 03/22/2013    SUBJECTIVE:Patient much improved today.  Shoulder pain better.  Echo yesterday demonstrated no effusion.   Sutures removed today from device explant site.   TELEMETRY: Reviewed telemetry pt in atrial fibrillation with ventricular pacing Filed Vitals:   03/21/13 2215 03/22/13 0000 03/22/13 0230 03/22/13 0400  BP: 94/44 123/41 112/48 136/47  Pulse:  65  64  Temp:  98.7 F (37.1 C)  98.2 F (36.8 C)  TempSrc:  Oral  Oral  Resp:  18  18  Height:      Weight:    118 lb 9.7 oz (53.8 kg)  SpO2:    99%    Intake/Output Summary (Last 24 hours) at 03/22/13 7829 Last data filed at 03/21/13 1937  Gross per 24 hour  Intake    320 ml  Output    852 ml  Net   -532 ml    CURRENT MEDICATIONS: . allopurinol  300 mg Oral Daily  . calcium-vitamin D  1 tablet Oral Daily  . digoxin  0.125 mg Oral Daily  . furosemide  40 mg Oral q morning - 10a  . HYDROcodone-acetaminophen  1 tablet Oral Once  . lisinopril  5 mg Oral Daily  . multivitamin-lutein  1 capsule Oral Daily  . sodium chloride  250 mL Intravenous Once  . [START ON 03/23/2013] warfarin  1 mg Oral Q Sat-1800  . warfarin  2 mg Oral Custom  . Warfarin - Physician Dosing Inpatient   Does not apply q1800    LABS: CBC:  Recent Labs  03/21/13 1028  WBC 15.7*  NEUTROABS 14.2*  HGB 10.2*  HCT 30.0*  MCV 97.1  PLT 145*     Radiology/Studies:  Dg Chest 2 View 03/21/2013   *RADIOLOGY REPORT*  Clinical Data: Post pacemaker insertion  CHEST - 2 VIEW  Comparison: 03/02/2013  Findings:  Grossly unchanged cardiac silhouette and mediastinal contours. Interval removal of the left anterior chest wall single lead pacemaker and placement of a right anterior chest wall dual lead pacemaker with lead tips projecting over the expected location of the right ventricle and the coronary sinus.  No pneumothorax.  No definite evidence of edema.  No focal  airspace opacities.  The lungs appear hyperexpanded with flattening of bilateral the diaphragms and blunting of the bilateral costophrenic angles.  No definite pleural effusion. No evidence of edema.  Grossly unchanged bones including multiple old/healed left-sided anterior rib fractures.  IMPRESSION: Interval removal of left-sided pacemaker and placement of a new right-sided dual lead pacemaker without evidence of complication.   Original Report Authenticated By: Tacey Ruiz, MD   PHYSICAL EXAM elderly appearing NAD HEENT: Unremarkable Neck:  No JVD, no thyromegally Lymphatics:  No adenopathy Back:  No CVA tenderness Lungs:  Clear with no wheezes HEART:  Regular rate rhythm, no murmurs, no rubs, no clicks Abd:  soft, positive bowel sounds, no organomegally, no rebound, no guarding Ext:  2 plus pulses, no edema, no cyanosis, no clubbing Skin:  No rashes no nodules Neuro:  CN II through XII intact, motor grossly intact  A/P 1. S/p biventricular pacemaker insertion 2. Postoperative pain and hypotension, resolved, necessitating intravenous narcotics and intravenous fluid resuscitation 3. Complete heart block 4. Ischemic cardiomyopathy ejection fraction 25% 5. Chronic systolic heart failure, class III, with pacing induced left bundle branch block Recommendation: the patient appears to be stable for discharge. He'll maintain a low-sodium diet,  continue his outpatient medications, and followup next week for her device checked.  Lewayne Bunting, M.D.

## 2013-03-22 NOTE — Progress Notes (Signed)
Patient ambulated in hall independently with standby assist. Gait slightly shaky at times, reported feeling "alittle lightheaded" but "much better than yesterday". No shortness of breath or pain with activity.

## 2013-03-25 ENCOUNTER — Encounter: Payer: Medicare Other | Admitting: Internal Medicine

## 2013-03-28 ENCOUNTER — Ambulatory Visit (INDEPENDENT_AMBULATORY_CARE_PROVIDER_SITE_OTHER): Payer: Medicare Other | Admitting: *Deleted

## 2013-03-28 DIAGNOSIS — Z7901 Long term (current) use of anticoagulants: Secondary | ICD-10-CM

## 2013-03-28 DIAGNOSIS — I4891 Unspecified atrial fibrillation: Secondary | ICD-10-CM

## 2013-03-28 DIAGNOSIS — Z95810 Presence of automatic (implantable) cardiac defibrillator: Secondary | ICD-10-CM

## 2013-03-28 DIAGNOSIS — I509 Heart failure, unspecified: Secondary | ICD-10-CM

## 2013-03-28 NOTE — Patient Instructions (Addendum)
INR 1.6 Coumadin dosing: Today take 3 mg or 1 1/2 tablets and then resume 2 mg daily except on Saturdays take 1 mg Will need to follow up with Dr [name] in 7-10 days

## 2013-03-28 NOTE — Progress Notes (Signed)
Pt seen in device clinic for follow up of recently implanted pacemaker. Pt concerned about dissipation of previous pocket---I said our hope is that it will slowly minimize but will have long-term evidence.  Wound well healed.  No redness, swelling, or edema.  Steri-strips removed today.   Device interrogated and found to be functioning normally.  Atrial lead not fully disabled---giving msgs of out of range impedance. No changes made today. See PaceArt for full details.  Pt denies chest pain, shortness of breath, palpitations, or dizziness.  Pt to follow up with Dr. Ladona Ridgel 07/02/13 @ 3:00.   Darlynn Ricco, Leonette Most 03/28/2013 5:28 PM

## 2013-03-28 NOTE — Progress Notes (Signed)
Admit date: 03/20/2013  Discharge date: 03/22/2013  Primary Discharge Diagnosis:  Ischemic cardiomyopathy and congestive heart failure status post CRT-P implantation this admission Primary Care Physician: Jarome Matin, MD--normally manages coumadin  Primary Cardiologist: Peter Swaziland, MD  Electrophysiologist: Sherryl Manges, MD  Hospital INR and dosing 8/8-INR 2.4 8/6-INR 2.15 8/9 coumadin 1 mg 8/7 coumadin 2 mg  Coumadin 2 mg tablet: 2 mg Sunday-Friday 1 mg on Saturday  Note to Dr Eloise Harman: Patient INR checked here today in coumadin clinic due to he also having a device check.  INR 1.6 today Instructed to take an extra 1/2 tablet today then resume 2 mg daily except on Saturdays take 1 mg F/u with you in 7-10 days  Thanks, Addison Lank, RN

## 2013-03-29 LAB — PACEMAKER DEVICE OBSERVATION
AL AMPLITUDE: 0.25 mv
BATTERY VOLTAGE: 3.0792 V
RV LEAD AMPLITUDE: 6.625 mv
RV LEAD THRESHOLD: 0.75 V

## 2013-04-08 NOTE — Discharge Summary (Signed)
EP Attending  Patient seen and examined. Agree with above.  Zola Runion,M.D. 

## 2013-04-29 ENCOUNTER — Encounter: Payer: Self-pay | Admitting: Internal Medicine

## 2013-05-21 ENCOUNTER — Encounter: Payer: Self-pay | Admitting: Internal Medicine

## 2013-07-02 ENCOUNTER — Encounter: Payer: Self-pay | Admitting: Internal Medicine

## 2013-07-02 ENCOUNTER — Ambulatory Visit (INDEPENDENT_AMBULATORY_CARE_PROVIDER_SITE_OTHER): Payer: Medicare Other | Admitting: Internal Medicine

## 2013-07-02 VITALS — BP 140/78 | HR 77 | Ht 68.0 in | Wt 132.1 lb

## 2013-07-02 DIAGNOSIS — I4891 Unspecified atrial fibrillation: Secondary | ICD-10-CM

## 2013-07-02 DIAGNOSIS — I509 Heart failure, unspecified: Secondary | ICD-10-CM

## 2013-07-02 DIAGNOSIS — Z950 Presence of cardiac pacemaker: Secondary | ICD-10-CM

## 2013-07-02 DIAGNOSIS — I442 Atrioventricular block, complete: Secondary | ICD-10-CM

## 2013-07-02 LAB — MDC_IDC_ENUM_SESS_TYPE_INCLINIC
Battery Remaining Longevity: 85 mo
Battery Voltage: 3.03 V
Brady Statistic AP VP Percent: 0 %
Brady Statistic RV Percent Paced: 88.97 %
Lead Channel Impedance Value: 4047 Ohm
Lead Channel Impedance Value: 418 Ohm
Lead Channel Impedance Value: 741 Ohm
Lead Channel Pacing Threshold Amplitude: 0.625 V
Lead Channel Sensing Intrinsic Amplitude: 6.5 mV
Lead Channel Setting Pacing Amplitude: 2.5 V
Lead Channel Setting Pacing Pulse Width: 0.4 ms
Lead Channel Setting Sensing Sensitivity: 4 mV
Zone Setting Detection Interval: 350 ms
Zone Setting Detection Interval: 400 ms

## 2013-07-02 NOTE — Assessment & Plan Note (Signed)
His Medtronic biventricular pacemaker is working normally. Plan to recheck in several months.

## 2013-07-02 NOTE — Progress Notes (Signed)
HPI [name] returns today for followup. He is a very pleasant 88 room and with an ischemic cardiomyopathy and complete heart block, who suddenly found to have ICD pocket infection after generator change out 2 years before. He has undergone ICD system extraction, insertion of a temporary permanent transvenous pacemaker, followed by re- implantation of a biventricular pacemaker. His heart failure has gone from class III to class II. Overall he feels well and denies fevers or chills. No syncope. Allergies  Allergen Reactions  . Penicillins Hives and Itching     Current Outpatient Prescriptions  Medication Sig Dispense Refill  . allopurinol (ZYLOPRIM) 300 MG tablet Take 300 mg by mouth daily.        [name] ALPRAZolam (XANAX) 0.5 MG tablet Take 1 mg by mouth at bedtime as needed for sleep.       [name] aspirin 81 MG tablet Take 81 mg by mouth daily.        . calcium-vitamin D (OSCAL WITH D) 500-200 MG-UNIT per tablet Take 1 tablet by mouth daily.       . colchicine 0.6 MG tablet Take 0.6 mg by mouth daily as needed (for gout).      [name] digoxin (LANOXIN) 0.125 MG tablet Take 0.125 mg by mouth daily.      . furosemide (LASIX) 40 MG tablet Take 40 mg by mouth every morning.       [name] HYDROcodone-acetaminophen (NORCO/VICODIN) 5-325 MG per tablet Take 1 tablet by mouth daily as needed for pain.       [name] lisinopril (PRINIVIL,ZESTRIL) 5 MG tablet Take 5 mg by mouth daily.       [name] loratadine (CLARITIN) 10 MG tablet Take 10 mg by mouth daily as needed for allergies.       [name] lovastatin (MEVACOR) 20 MG tablet Take 20 mg by mouth at bedtime.        . multivitamin-lutein (OCUVITE-LUTEIN) CAPS Take 1 capsule by mouth daily.      . sertraline (ZOLOFT) 25 MG tablet Take 1 tablet by mouth daily.      [name] warfarin (COUMADIN) 2 MG tablet Take 1-2 mg by mouth daily. Take 1 mg on Saturday.  Take 2 mg all other days of the week. This regimen started 03/12/13       No current facility-administered medications for this visit.      Past Medical History  Diagnosis Date  . Atrial fibrillation     Chronic  . Coronary artery disease     Status post CABG. Hx of anterior apical infarct.  . Renal insufficiency     Chronic  . Hypertension   . Hyperlipidemia   . Gout   . CHF (congestive heart failure)     with chronic ischemic cardiomypathy. EF of 10-15%.  CHF due to systolic dysfunction. Clinically doing well.  [name] GERD (gastroesophageal reflux disease)   . Pacemaker   . Shortness of breath     "only w/CHF" (03/20/2013)  . Type II diabetes mellitus     "been off my RX for ~ 2 yr" (03/20/2013)  . H/O hiatal hernia   . Arthritis     "right thumb" (03/20/2013)  . Depression   . Anxiety   . Bladder cancer     "low grade; grade 1; non-invasive" (03/20/2013)    ROS:   All systems reviewed and negative except as noted in the HPI.   Past Surgical History  Procedure Laterality Date  . Transurethral resection of prostate  1990's; 2004    "  twice; injected chemo 1st time; didn't do that 2nd time" (03/20/2013)  . Nasal septum surgery    . US echocardiography  04-17-2009    Est EF 10-15%  . Cardiovascular stress test  04-17-2009    EF 29%  . Coronary artery bypass graft  02/19/2003    CABG "X2" (03/20/2013)  . Carotid endarterectomy Right 1990's    "left side is still  100% blocked" (03/20/2013)  . Icd lead removal Left 03/01/2013    Procedure: ICD LEAD REMOVAL;  Surgeon: Marinus Maw, MD;  Location: [facility] OR;  Service: Cardiovascular;  Laterality: Left;  . Pacemaker lead removal Left 03/01/2013    Procedure: PACEMAKER LEAD REMOVAL;  Surgeon: Marinus Maw, MD;  Location: [facility] OR;  Service: Cardiovascular;  Laterality: Left;  . Pacemaker insertion N/A 03/01/2013    Procedure: INSERTION PACEMAKER LEAD;  Surgeon: Marinus Maw, MD;  Location: [facility] OR;  Service: Cardiovascular;  Laterality: N/A;  . Bi-ventricular pacemaker insertion (crt-p) Right 03/20/2013  . Hemorrhoid surgery      "lanced several years ago" (03/20/2013)  .  Tonsillectomy and adenoidectomy  1933  . Cataract extraction w/ intraocular lens  implant, bilateral Bilateral ~ 2000     History reviewed. No pertinent family history.   History   Social History  . Marital Status: Married    Spouse Name: N/A    Number of Children: 1  . Years of Education: N/A   Occupational History  .     Social History Main Topics  . Smoking status: Former Smoker -- 1.00 packs/day for 17 years    Types: Cigarettes    Quit date: 02/28/1960  . Smokeless tobacco: Never Used  . Alcohol Use: Yes     Comment: 03/20/2013 "been about 2-3 months since I've had alcohol; never been a heavy drinker; was 1-2 drinks before dinner probably 5 nights/wk"  . Drug Use: No  . Sexual Activity: Not Currently   Other Topics Concern  . Not on file   Social History Narrative  . No narrative on file     BP 140/78  Pulse 77  Ht 5\' 8"  (1.727 m)  Wt 132 lb 1.9 oz (59.929 kg)  BMI 20.09 kg/m2  Physical Exam:  Well appearing elderly man,  NAD HEENT: Unremarkable Neck:  7 cm JVD, no thyromegally Lungs:  Clear except for basilar rales. Both incisions are well-healed. HEART:  Regular rate rhythm, no murmurs, no rubs, no clicks Abd:  soft, positive bowel sounds, no organomegally, no rebound, no guarding Ext:  2 plus pulses, no edema, no cyanosis, no clubbing Skin:  No rashes no nodules Neuro:  CN II through XII intact, motor grossly intact   Assess/Plan:

## 2013-07-02 NOTE — Patient Instructions (Addendum)
Your physician wants you to follow-up in: 10/2013 with Dr Swaziland and 03/2014 with Dr Court Joy will receive a reminder letter in the mail two months in advance. If you don't receive a letter, please call our office to schedule the follow-up appointment.   Remote monitoring is used to monitor your Pacemaker or ICD from home. This monitoring reduces the number of office visits required to check your device to one time per year. It allows [place] to keep an eye on the functioning of your device to ensure it is working properly. You are scheduled for a device check from home on 10/03/2012. You may send your transmission at any time that day. If you have a wireless device, the transmission will be sent automatically. After your physician reviews your transmission, you will receive a postcard with your next transmission date.

## 2013-07-02 NOTE — Assessment & Plan Note (Signed)
His ventricular rate is well controlled. No change in medical therapy. 

## 2013-07-17 ENCOUNTER — Encounter: Payer: Self-pay | Admitting: Internal Medicine

## 2013-07-26 ENCOUNTER — Ambulatory Visit (HOSPITAL_COMMUNITY)
Admission: RE | Admit: 2013-07-26 | Discharge: 2013-07-26 | Disposition: A | Discharge status: Home / Self Care | Payer: Medicare Other | Source: Ambulatory Visit | Attending: Vascular Surgery | Admitting: Vascular Surgery

## 2013-07-26 ENCOUNTER — Other Ambulatory Visit (HOSPITAL_COMMUNITY): Payer: Self-pay | Admitting: Internal Medicine

## 2013-07-26 DIAGNOSIS — R0989 Other specified symptoms and signs involving the circulatory and respiratory systems: Secondary | ICD-10-CM | POA: Insufficient documentation

## 2013-10-03 ENCOUNTER — Encounter: Payer: Medicare Other | Admitting: *Deleted

## 2013-10-07 ENCOUNTER — Encounter: Payer: Self-pay | Admitting: *Deleted

## 2013-10-14 ENCOUNTER — Telehealth: Payer: Self-pay | Admitting: Internal Medicine

## 2013-10-14 ENCOUNTER — Ambulatory Visit (INDEPENDENT_AMBULATORY_CARE_PROVIDER_SITE_OTHER): Payer: Medicare Other | Admitting: *Deleted

## 2013-10-14 ENCOUNTER — Encounter: Payer: Self-pay | Admitting: Internal Medicine

## 2013-10-14 DIAGNOSIS — I509 Heart failure, unspecified: Secondary | ICD-10-CM

## 2013-10-14 DIAGNOSIS — I4891 Unspecified atrial fibrillation: Secondary | ICD-10-CM

## 2013-10-14 NOTE — Telephone Encounter (Signed)
Spoke w/pt to let know transmission received.

## 2013-10-14 NOTE — Telephone Encounter (Signed)
New message    Pt received a letter stating we did not get his remote transmission.  pls call pt and explain how to do that.  In the past he did not have to do anything---box was wireless.

## 2013-10-15 LAB — MDC_IDC_ENUM_SESS_TYPE_REMOTE
Battery Remaining Longevity: 86 mo
Battery Voltage: 3.03 V
Brady Statistic AP VP Percent: 0 %
Brady Statistic AS VP Percent: 93.48 %
Brady Statistic RA Percent Paced: 0 %
Date Time Interrogation Session: 20150303005731
Lead Channel Impedance Value: 4047 Ohm
Lead Channel Impedance Value: 418 Ohm
Lead Channel Impedance Value: 570 Ohm
Lead Channel Impedance Value: 608 Ohm
Lead Channel Impedance Value: 722 Ohm
Lead Channel Pacing Threshold Amplitude: 0.625 V
Lead Channel Pacing Threshold Amplitude: 0.75 V
Lead Channel Pacing Threshold Pulse Width: 0.4 ms
Lead Channel Sensing Intrinsic Amplitude: 0.25 mV
Lead Channel Setting Pacing Amplitude: 2 V
Lead Channel Setting Pacing Pulse Width: 0.4 ms
Lead Channel Setting Sensing Sensitivity: 4 mV
MDC IDC MSMT LEADCHNL LV IMPEDANCE VALUE: 456 Ohm
MDC IDC MSMT LEADCHNL LV PACING THRESHOLD PULSEWIDTH: 0.4 ms
MDC IDC MSMT LEADCHNL RA IMPEDANCE VALUE: 4047 Ohm
MDC IDC MSMT LEADCHNL RV IMPEDANCE VALUE: 418 Ohm
MDC IDC MSMT LEADCHNL RV IMPEDANCE VALUE: 532 Ohm
MDC IDC MSMT LEADCHNL RV SENSING INTR AMPL: 14.5 mV
MDC IDC SET LEADCHNL LV PACING AMPLITUDE: 2.25 V
MDC IDC SET LEADCHNL LV PACING PULSEWIDTH: 0.4 ms
MDC IDC STAT BRADY AP VS PERCENT: 0 %
MDC IDC STAT BRADY AS VS PERCENT: 6.52 %
MDC IDC STAT BRADY RV PERCENT PACED: 93.48 %
Zone Setting Detection Interval: 350 ms
Zone Setting Detection Interval: 400 ms

## 2013-10-31 ENCOUNTER — Encounter: Payer: Self-pay | Admitting: *Deleted

## 2013-11-04 ENCOUNTER — Encounter: Payer: Self-pay | Admitting: *Deleted

## 2014-01-20 ENCOUNTER — Ambulatory Visit (INDEPENDENT_AMBULATORY_CARE_PROVIDER_SITE_OTHER): Payer: Medicare Other | Admitting: *Deleted

## 2014-01-20 DIAGNOSIS — I509 Heart failure, unspecified: Secondary | ICD-10-CM

## 2014-01-20 DIAGNOSIS — I4891 Unspecified atrial fibrillation: Secondary | ICD-10-CM

## 2014-01-20 LAB — MDC_IDC_ENUM_SESS_TYPE_REMOTE
Battery Remaining Longevity: 80 mo
Battery Voltage: 3.02 V
Brady Statistic AP VP Percent: 0 %
Brady Statistic AP VS Percent: 0 %
Brady Statistic AS VP Percent: 91.26 %
Brady Statistic AS VS Percent: 8.74 %
Brady Statistic RA Percent Paced: 0 %
Brady Statistic RV Percent Paced: 91.26 %
Date Time Interrogation Session: 20150608200541
Lead Channel Impedance Value: 380 Ohm
Lead Channel Impedance Value: 399 Ohm
Lead Channel Impedance Value: 4047 Ohm
Lead Channel Impedance Value: 4047 Ohm
Lead Channel Impedance Value: 456 Ohm
Lead Channel Impedance Value: 494 Ohm
Lead Channel Impedance Value: 513 Ohm
Lead Channel Impedance Value: 570 Ohm
Lead Channel Impedance Value: 684 Ohm
Lead Channel Pacing Threshold Amplitude: 0.625 V
Lead Channel Pacing Threshold Amplitude: 0.875 V
Lead Channel Pacing Threshold Pulse Width: 0.4 ms
Lead Channel Pacing Threshold Pulse Width: 0.4 ms
Lead Channel Sensing Intrinsic Amplitude: 0.25 mV
Lead Channel Sensing Intrinsic Amplitude: 0.25 mV
Lead Channel Sensing Intrinsic Amplitude: 6.875 mV
Lead Channel Sensing Intrinsic Amplitude: 6.875 mV
Lead Channel Setting Pacing Amplitude: 2 V
Lead Channel Setting Pacing Amplitude: 2.5 V
Lead Channel Setting Pacing Pulse Width: 0.4 ms
Lead Channel Setting Pacing Pulse Width: 0.4 ms
Lead Channel Setting Sensing Sensitivity: 4 mV
Zone Setting Detection Interval: 350 ms
Zone Setting Detection Interval: 400 ms

## 2014-01-20 NOTE — Progress Notes (Signed)
Remote pacemaker transmission.   

## 2014-02-04 ENCOUNTER — Encounter: Payer: Self-pay | Admitting: Cardiology

## 2014-02-06 ENCOUNTER — Encounter: Payer: Self-pay | Admitting: Internal Medicine

## 2014-04-01 ENCOUNTER — Encounter: Payer: Self-pay | Admitting: Internal Medicine

## 2014-04-01 ENCOUNTER — Ambulatory Visit (INDEPENDENT_AMBULATORY_CARE_PROVIDER_SITE_OTHER): Payer: Medicare Other | Admitting: Internal Medicine

## 2014-04-01 VITALS — BP 128/77 | HR 82 | Ht 67.0 in | Wt 130.0 lb

## 2014-04-01 DIAGNOSIS — I4891 Unspecified atrial fibrillation: Secondary | ICD-10-CM

## 2014-04-01 DIAGNOSIS — I10 Essential (primary) hypertension: Secondary | ICD-10-CM

## 2014-04-01 DIAGNOSIS — I5022 Chronic systolic (congestive) heart failure: Secondary | ICD-10-CM

## 2014-04-01 DIAGNOSIS — Z950 Presence of cardiac pacemaker: Secondary | ICD-10-CM

## 2014-04-01 DIAGNOSIS — I509 Heart failure, unspecified: Secondary | ICD-10-CM

## 2014-04-01 LAB — MDC_IDC_ENUM_SESS_TYPE_INCLINIC
Battery Remaining Longevity: 73 mo
Battery Voltage: 3.01 V
Brady Statistic AP VP Percent: 0 %
Brady Statistic AP VS Percent: 0 %
Brady Statistic AS VP Percent: 90.4 %
Brady Statistic AS VS Percent: 9.6 %
Brady Statistic RA Percent Paced: 0 %
Date Time Interrogation Session: 20150818215537
Lead Channel Impedance Value: 380 Ohm
Lead Channel Impedance Value: 380 Ohm
Lead Channel Impedance Value: 4047 Ohm
Lead Channel Impedance Value: 4047 Ohm
Lead Channel Impedance Value: 456 Ohm
Lead Channel Impedance Value: 513 Ohm
Lead Channel Impedance Value: 570 Ohm
Lead Channel Pacing Threshold Amplitude: 0.5 V
Lead Channel Pacing Threshold Amplitude: 0.75 V
Lead Channel Pacing Threshold Pulse Width: 0.4 ms
Lead Channel Pacing Threshold Pulse Width: 0.4 ms
Lead Channel Sensing Intrinsic Amplitude: 0.5 mV
Lead Channel Sensing Intrinsic Amplitude: 15.125 mV
Lead Channel Setting Pacing Amplitude: 2.25 V
Lead Channel Setting Pacing Pulse Width: 0.4 ms
Lead Channel Setting Sensing Sensitivity: 4 mV
MDC IDC MSMT LEADCHNL LV IMPEDANCE VALUE: 437 Ohm
MDC IDC MSMT LEADCHNL LV IMPEDANCE VALUE: 665 Ohm
MDC IDC MSMT LEADCHNL RA SENSING INTR AMPL: 0.5 mV
MDC IDC MSMT LEADCHNL RV SENSING INTR AMPL: 15.125 mV
MDC IDC SET LEADCHNL RV PACING AMPLITUDE: 2 V
MDC IDC SET LEADCHNL RV PACING PULSEWIDTH: 0.4 ms
MDC IDC SET ZONE DETECTION INTERVAL: 350 ms
MDC IDC STAT BRADY RV PERCENT PACED: 90.4 %
Zone Setting Detection Interval: 400 ms

## 2014-04-01 NOTE — Progress Notes (Signed)
HPI [name] returns today for followup. He is a very pleasant 80 yo man with an ischemic cardiomyopathy and complete heart block, who was found to have ICD pocket infection after generator change out 2 years before. He has undergone ICD system extraction, insertion of a temporary permanent transvenous pacemaker, followed by re- implantation of a biventricular pacemaker. His heart failure has gone from class III to class II. Overall he feels well and denies fevers or chills. No syncope. He remains active. He c/o having trouble with sleeping at night.  Allergies  Allergen Reactions  . Penicillins Hives and Itching     Current Outpatient Prescriptions  Medication Sig Dispense Refill  . allopurinol (ZYLOPRIM) 300 MG tablet Take 300 mg by mouth daily.        [name] ALPRAZolam (XANAX) 0.5 MG tablet Take 1 mg by mouth at bedtime as needed for sleep.       [name] aspirin 81 MG tablet Take 81 mg by mouth daily.        . calcium-vitamin D (OSCAL WITH D) 500-200 MG-UNIT per tablet Take 1 tablet by mouth daily.       . colchicine 0.6 MG tablet Take 0.6 mg by mouth daily as needed (for gout).      [name] digoxin (LANOXIN) 0.125 MG tablet Take 0.125 mg by mouth daily.      . furosemide (LASIX) 40 MG tablet Take 40 mg by mouth every morning.       [name] HYDROcodone-acetaminophen (NORCO/VICODIN) 5-325 MG per tablet Take 1 tablet by mouth daily as needed for pain.       [name] lisinopril (PRINIVIL,ZESTRIL) 5 MG tablet Take 5 mg by mouth daily.       [name] loratadine (CLARITIN) 10 MG tablet Take 10 mg by mouth daily as needed for allergies.       [name] lovastatin (MEVACOR) 20 MG tablet Take 20 mg by mouth at bedtime.        . multivitamin-lutein (OCUVITE-LUTEIN) CAPS Take 1 capsule by mouth daily.      [name] warfarin (COUMADIN) 2 MG tablet Take 1-2 mg by mouth daily. Take 1 mg on Saturday.  Take 2 mg all other days of the week. This regimen started 03/12/13       No current facility-administered medications for this visit.     Past Medical  History  Diagnosis Date  . Atrial fibrillation     Chronic  . Coronary artery disease     Status post CABG. Hx of anterior apical infarct.  . Renal insufficiency     Chronic  . Hypertension   . Hyperlipidemia   . Gout   . CHF (congestive heart failure)     with chronic ischemic cardiomypathy. EF of 10-15%.  CHF due to systolic dysfunction. Clinically doing well.  [name] GERD (gastroesophageal reflux disease)   . Pacemaker   . Shortness of breath     "only w/CHF" (03/20/2013)  . Type II diabetes mellitus     "been off my RX for ~ 2 yr" (03/20/2013)  . H/O hiatal hernia   . Arthritis     "right thumb" (03/20/2013)  . Depression   . Anxiety   . Bladder cancer     "low grade; grade 1; non-invasive" (03/20/2013)    ROS:   All systems reviewed and negative except as noted in the HPI.   Past Surgical History  Procedure Laterality Date  . Transurethral resection of prostate  1990's; 2004    "twice; injected  chemo 1st time; didn't do that 2nd time" (03/20/2013)  . Nasal septum surgery    . US echocardiography  04-17-2009    Est EF 10-15%  . Cardiovascular stress test  04-17-2009    EF 29%  . Coronary artery bypass graft  02/19/2003    CABG "X2" (03/20/2013)  . Carotid endarterectomy Right 1990's    "left side is still  100% blocked" (03/20/2013)  . Icd lead removal Left 03/01/2013    Procedure: ICD LEAD REMOVAL;  Surgeon: Evans Lance, MD;  Location: [facility];  Service: Cardiovascular;  Laterality: Left;  . Pacemaker lead removal Left 03/01/2013    Procedure: PACEMAKER LEAD REMOVAL;  Surgeon: Evans Lance, MD;  Location: [place];  Service: Cardiovascular;  Laterality: Left;  . Pacemaker insertion N/A 03/01/2013    Procedure: INSERTION PACEMAKER LEAD;  Surgeon: Evans Lance, MD;  Location: [place];  Service: Cardiovascular;  Laterality: N/A;  . Bi-ventricular pacemaker insertion (crt-p) Right 03/20/2013  . Hemorrhoid surgery      "lanced several years ago" (03/20/2013)  . Tonsillectomy and adenoidectomy   1933  . Cataract extraction w/ intraocular lens  implant, bilateral Bilateral ~ 2000     History reviewed. No pertinent family history.   History   Social History  . Marital Status: Married    Spouse Name: N/A    Number of Children: 1  . Years of Education: N/A   Occupational History  .     Social History Main Topics  . Smoking status: Former Smoker -- 1.00 packs/day for 17 years    Types: Cigarettes    Quit date: 02/28/1960  . Smokeless tobacco: Never Used  . Alcohol Use: Yes     Comment: 03/20/2013 "been about 2-3 months since I've had alcohol; never been a heavy drinker; was 1-2 drinks before dinner probably 5 nights/wk"  . Drug Use: No  . Sexual Activity: Not Currently   Other Topics Concern  . Not on file   Social History Narrative  . No narrative on file     BP 128/77  Pulse 82  Ht 5\' 7"  (1.702 m)  Wt 130 lb (58.968 kg)  BMI 20.36 kg/m2  Physical Exam:  Well appearing elderly man,  NAD HEENT: Unremarkable Neck:  7 cm JVD, no thyromegally Lungs:  Clear except for basilar rales. Both incisions are well-healed. HEART:  Regular rate rhythm, no murmurs, no rubs, no clicks Abd:  soft, positive bowel sounds, no organomegally, no rebound, no guarding Ext:  2 plus pulses, no edema, no cyanosis, no clubbing Skin:  No rashes no nodules Neuro:  CN II through XII intact, motor grossly intact   Assess/Plan:

## 2014-04-01 NOTE — Assessment & Plan Note (Addendum)
His symptoms are class 2. He will continue his current meds and maintain a low sodium diet. His Optivol is up and I have asked him to take an extra lasix today and tomorrow.

## 2014-04-01 NOTE — Patient Instructions (Addendum)
Your physician has recommended you make the following change in your medication:  1) TAKE 1 EXTRA LASIX PILL TODAY AND TOMORROW.  2) [place] ON Thursday.  Low-Sodium Eating Plan Sodium raises blood pressure and causes water to be held in the body. Getting less sodium from food will help lower your blood pressure, reduce any swelling, and protect your heart, liver, and kidneys. We get sodium by adding salt (sodium chloride) to food. Most of our sodium comes from canned, boxed, and frozen foods. Restaurant foods, fast foods, and pizza are also very high in sodium. Even if you take medicine to lower your blood pressure or to reduce fluid in your body, getting less sodium from your food is important. WHAT IS MY PLAN? Most people should limit their sodium intake to 2,300 mg a day. Your health care provider recommends that you limit your sodium intake to __________ a day.  WHAT DO I NEED TO KNOW ABOUT THIS EATING PLAN? For the low-sodium eating plan, you will follow these general guidelines:  Choose foods with a % Daily Value for sodium of less than 5% (as listed on the food label).   Use salt-free seasonings or herbs instead of table salt or sea salt.   Check with your health care provider or pharmacist before using salt substitutes.   Eat fresh foods.  Eat more vegetables and fruits.  Limit canned vegetables. If you do use them, rinse them well to decrease the sodium.   Limit cheese to 1 oz (28 g) per day.   Eat lower-sodium products, often labeled as "lower sodium" or "no salt added."  Avoid foods that contain monosodium glutamate (MSG). MSG is sometimes added to [place] food and some canned foods.  Check food labels (Nutrition Facts labels) on foods to learn how much sodium is in one serving.  Eat more home-cooked food and less restaurant, buffet, and fast food.  When eating at a restaurant, ask that your food be prepared with less salt or none, if possible.  HOW  DO I READ FOOD LABELS FOR SODIUM INFORMATION? The Nutrition Facts label lists the amount of sodium in one serving of the food. If you eat more than one serving, you must multiply the listed amount of sodium by the number of servings. Food labels may also identify foods as:  Sodium free--Less than 5 mg in a serving.  Very low sodium--35 mg or less in a serving.  Low sodium--140 mg or less in a serving.  Light in sodium--50% less sodium in a serving. For example, if a food that usually has 300 mg of sodium is changed to become light in sodium, it will have 150 mg of sodium.  Reduced sodium--25% less sodium in a serving. For example, if a food that usually has 400 mg of sodium is changed to reduced sodium, it will have 300 mg of sodium. WHAT FOODS CAN I EAT? Grains Low-sodium cereals, including oats, puffed wheat and rice, and shredded wheat cereals. Low-sodium crackers. Unsalted rice and pasta. Lower-sodium bread.  Vegetables Frozen or fresh vegetables. Low-sodium or reduced-sodium canned vegetables. Low-sodium or reduced-sodium tomato sauce and paste. Low-sodium or reduced-sodium tomato and vegetable juices.  Fruits Fresh, frozen, and canned fruit. Fruit juice.  Meat and Other Protein Products Low-sodium canned tuna and salmon. Fresh or frozen meat, poultry, seafood, and fish. Lamb. Unsalted nuts. Dried beans, peas, and lentils without added salt. Unsalted canned beans. Homemade soups without salt. Eggs.  Dairy Milk. Soy milk. Ricotta cheese. Low-sodium  or reduced-sodium cheeses. Yogurt.  Condiments Fresh and dried herbs and spices. Salt-free seasonings. Onion and garlic powders. Low-sodium varieties of mustard and ketchup. Lemon juice.  Fats and Oils Reduced-sodium salad dressings. Unsalted butter.  Other Unsalted popcorn and pretzels.  The items listed above may not be a complete list of recommended foods or beverages. Contact your dietitian for more options. WHAT FOODS ARE  NOT RECOMMENDED? Grains Instant hot cereals. Bread stuffing, pancake, and biscuit mixes. Croutons. Seasoned rice or pasta mixes. Noodle soup cups. Boxed or frozen macaroni and cheese. Self-rising flour. Regular salted crackers. Vegetables Regular canned vegetables. Regular canned tomato sauce and paste. Regular tomato and vegetable juices. Frozen vegetables in sauces. Salted french fries. Olives. Angie Fava. Relishes. Sauerkraut. Salsa. Meat and Other Protein Products Salted, canned, smoked, spiced, or pickled meats, seafood, or fish. Bacon, ham, sausage, hot dogs, corned beef, chipped beef, and packaged luncheon meats. Salt pork. Jerky. Pickled herring. Anchovies, regular canned tuna, and sardines. Salted nuts. Dairy Processed cheese and cheese spreads. Cheese curds. Blue cheese and cottage cheese. Buttermilk.  Condiments Onion and garlic salt, seasoned salt, table salt, and sea salt. Canned and packaged gravies. Worcestershire sauce. Tartar sauce. Barbecue sauce. Teriyaki sauce. Soy sauce, including reduced sodium. Steak sauce. Fish sauce. Oyster sauce. Cocktail sauce. Horseradish. Regular ketchup and mustard. Meat flavorings and tenderizers. Bouillon cubes. Hot sauce. Tabasco sauce. Marinades. Taco seasonings. Relishes. Fats and Oils Regular salad dressings. Salted butter. Margarine. Ghee. Bacon fat.  Other Potato and tortilla chips. Corn chips and puffs. Salted popcorn and pretzels. Canned or dried soups. Pizza. Frozen entrees and pot pies.  The items listed above may not be a complete list of foods and beverages to avoid. Contact your dietitian for more information. Document Released: 01/21/2002 Document Revised: 08/06/2013 Document Reviewed: 06/05/2013 [facility] Patient Information 2015 [place], [place]. This information is not intended to replace advice given to you by your health care provider. Make sure you discuss any questions you have with your health care provider.    Your  physician wants you to follow-up in: [place]. You will receive a reminder letter in the mail two months in advance. If you don't receive a letter, please call our office to schedule the follow-up appointment.  Remote monitoring is used to monitor your Pacemaker of ICD from home. This monitoring reduces the number of office visits required to check your device to one time per year. It allows [place] to keep an eye on the functioning of your device to ensure it is working properly. You are scheduled for a device check from home on 07/01/2014. You may send your transmission at any time that day. If you have a wireless device, the transmission will be sent automatically. After your physician reviews your transmission, you will receive a postcard with your next transmission date.

## 2014-04-01 NOTE — Assessment & Plan Note (Signed)
His BiV PPM is working normally. Will recheck in several months.

## 2014-04-01 NOTE — Assessment & Plan Note (Signed)
His blood pressure is well controlled. Will recheck in several months. 

## 2014-06-28 ENCOUNTER — Encounter (HOSPITAL_COMMUNITY): Payer: Self-pay | Admitting: Family Medicine

## 2014-06-28 ENCOUNTER — Observation Stay (HOSPITAL_COMMUNITY)
Admission: EM | Admit: 2014-06-28 | Discharge: 2014-06-29 | Disposition: A | Discharge status: Home / Self Care | Payer: Medicare Other | Attending: Internal Medicine | Admitting: Internal Medicine

## 2014-06-28 DIAGNOSIS — I4891 Unspecified atrial fibrillation: Secondary | ICD-10-CM | POA: Diagnosis not present

## 2014-06-28 DIAGNOSIS — F329 Major depressive disorder, single episode, unspecified: Secondary | ICD-10-CM | POA: Insufficient documentation

## 2014-06-28 DIAGNOSIS — E119 Type 2 diabetes mellitus without complications: Secondary | ICD-10-CM | POA: Insufficient documentation

## 2014-06-28 DIAGNOSIS — K219 Gastro-esophageal reflux disease without esophagitis: Secondary | ICD-10-CM | POA: Insufficient documentation

## 2014-06-28 DIAGNOSIS — Z7982 Long term (current) use of aspirin: Secondary | ICD-10-CM | POA: Diagnosis not present

## 2014-06-28 DIAGNOSIS — T45515A Adverse effect of anticoagulants, initial encounter: Secondary | ICD-10-CM | POA: Diagnosis not present

## 2014-06-28 DIAGNOSIS — Z87891 Personal history of nicotine dependence: Secondary | ICD-10-CM | POA: Diagnosis not present

## 2014-06-28 DIAGNOSIS — R791 Abnormal coagulation profile: Secondary | ICD-10-CM | POA: Diagnosis not present

## 2014-06-28 DIAGNOSIS — E869 Volume depletion, unspecified: Secondary | ICD-10-CM | POA: Diagnosis not present

## 2014-06-28 DIAGNOSIS — I129 Hypertensive chronic kidney disease with stage 1 through stage 4 chronic kidney disease, or unspecified chronic kidney disease: Secondary | ICD-10-CM | POA: Insufficient documentation

## 2014-06-28 DIAGNOSIS — I5022 Chronic systolic (congestive) heart failure: Secondary | ICD-10-CM | POA: Diagnosis not present

## 2014-06-28 DIAGNOSIS — I255 Ischemic cardiomyopathy: Secondary | ICD-10-CM | POA: Diagnosis not present

## 2014-06-28 DIAGNOSIS — Z951 Presence of aortocoronary bypass graft: Secondary | ICD-10-CM | POA: Insufficient documentation

## 2014-06-28 DIAGNOSIS — Z8551 Personal history of malignant neoplasm of bladder: Secondary | ICD-10-CM | POA: Insufficient documentation

## 2014-06-28 DIAGNOSIS — I2510 Atherosclerotic heart disease of native coronary artery without angina pectoris: Secondary | ICD-10-CM | POA: Insufficient documentation

## 2014-06-28 DIAGNOSIS — Z880 Allergy status to penicillin: Secondary | ICD-10-CM | POA: Diagnosis not present

## 2014-06-28 DIAGNOSIS — M109 Gout, unspecified: Secondary | ICD-10-CM | POA: Diagnosis not present

## 2014-06-28 DIAGNOSIS — Z950 Presence of cardiac pacemaker: Secondary | ICD-10-CM | POA: Diagnosis not present

## 2014-06-28 DIAGNOSIS — E785 Hyperlipidemia, unspecified: Secondary | ICD-10-CM | POA: Insufficient documentation

## 2014-06-28 DIAGNOSIS — F419 Anxiety disorder, unspecified: Secondary | ICD-10-CM | POA: Diagnosis not present

## 2014-06-28 DIAGNOSIS — M13841 Other specified arthritis, right hand: Secondary | ICD-10-CM | POA: Insufficient documentation

## 2014-06-28 DIAGNOSIS — N189 Chronic kidney disease, unspecified: Secondary | ICD-10-CM | POA: Diagnosis not present

## 2014-06-28 DIAGNOSIS — N179 Acute kidney failure, unspecified: Principal | ICD-10-CM | POA: Insufficient documentation

## 2014-06-28 DIAGNOSIS — E860 Dehydration: Secondary | ICD-10-CM | POA: Insufficient documentation

## 2014-06-28 DIAGNOSIS — Z7901 Long term (current) use of anticoagulants: Secondary | ICD-10-CM | POA: Diagnosis not present

## 2014-06-28 LAB — BASIC METABOLIC PANEL
Anion gap: 20 — ABNORMAL HIGH (ref 5–15)
BUN: 87 mg/dL — AB (ref 6–23)
CO2: 21 meq/L (ref 19–32)
Calcium: 8.6 mg/dL (ref 8.4–10.5)
Chloride: 90 mEq/L — ABNORMAL LOW (ref 96–112)
Creatinine, Ser: 3.24 mg/dL — ABNORMAL HIGH (ref 0.50–1.35)
GFR calc Af Amer: 18 mL/min — ABNORMAL LOW (ref >90)
GFR, EST NON AFRICAN AMERICAN: 16 mL/min — AB (ref >90)
GLUCOSE: 154 mg/dL — AB (ref 70–99)
POTASSIUM: 5.3 meq/L (ref 3.7–5.3)
SODIUM: 131 meq/L — AB (ref 137–147)

## 2014-06-28 LAB — CBG MONITORING, ED: Glucose-Capillary: 167 mg/dL — ABNORMAL HIGH (ref 70–99)

## 2014-06-28 LAB — PROTIME-INR
INR: 2.82 — AB (ref 0.00–1.49)
PROTHROMBIN TIME: 29.9 s — AB (ref 11.6–15.2)

## 2014-06-28 LAB — CBC
HEMATOCRIT: 35.6 % — AB (ref 39.0–52.0)
HEMOGLOBIN: 12.1 g/dL — AB (ref 13.0–17.0)
MCH: 33.2 pg (ref 26.0–34.0)
MCHC: 34 g/dL (ref 30.0–36.0)
MCV: 97.5 fL (ref 78.0–100.0)
Platelets: 230 10*3/uL (ref 150–400)
RBC: 3.65 MIL/uL — AB (ref 4.22–5.81)
RDW: 14 % (ref 11.5–15.5)
WBC: 10.2 10*3/uL (ref 4.0–10.5)

## 2014-06-28 LAB — URINALYSIS, ROUTINE W REFLEX MICROSCOPIC
Bilirubin Urine: NEGATIVE
Glucose, UA: NEGATIVE mg/dL
HGB URINE DIPSTICK: NEGATIVE
Ketones, ur: 15 mg/dL — AB
Leukocytes, UA: NEGATIVE
NITRITE: NEGATIVE
PROTEIN: NEGATIVE mg/dL
SPECIFIC GRAVITY, URINE: 1.011 (ref 1.005–1.030)
UROBILINOGEN UA: 0.2 mg/dL (ref 0.0–1.0)
pH: 6 (ref 5.0–8.0)

## 2014-06-28 MED ORDER — SODIUM CHLORIDE 0.9 % IV SOLN
INTRAVENOUS | Status: DC
  Administered 2014-06-28: 15:00:00 via INTRAVENOUS

## 2014-06-28 MED ORDER — ALPRAZOLAM 0.5 MG PO TABS: 1.0000 mg | ORAL_TABLET | Freq: Every evening | ORAL | Status: DC | PRN

## 2014-06-28 MED ORDER — DOCUSATE SODIUM 100 MG PO CAPS
100.0000 mg | ORAL_CAPSULE | Freq: Two times a day (BID) | ORAL | Status: DC
  Administered 2014-06-28 – 2014-06-29 (×2): 100 mg via ORAL
  Filled 2014-06-28 (×2): qty 1

## 2014-06-28 MED ORDER — ZOLPIDEM TARTRATE 5 MG PO TABS: 5.0000 mg | ORAL_TABLET | Freq: Every evening | ORAL | Status: DC | PRN

## 2014-06-28 MED ORDER — ONDANSETRON HCL 4 MG/2ML IJ SOLN: 4.0000 mg | Freq: Three times a day (TID) | INTRAMUSCULAR | Status: AC | PRN

## 2014-06-28 MED ORDER — OCUVITE-LUTEIN PO CAPS
1.0000 | ORAL_CAPSULE | Freq: Every day | ORAL | Status: DC
  Administered 2014-06-29: 1 via ORAL
  Filled 2014-06-28 (×2): qty 1

## 2014-06-28 MED ORDER — MORPHINE SULFATE 2 MG/ML IJ SOLN
2.0000 mg | INTRAMUSCULAR | Status: DC | PRN
  Filled 2014-06-28: qty 1

## 2014-06-28 MED ORDER — COLCHICINE 0.6 MG PO TABS: 0.6000 mg | ORAL_TABLET | Freq: Every day | ORAL | Status: DC | PRN

## 2014-06-28 MED ORDER — SODIUM CHLORIDE 0.9 % IV SOLN
INTRAVENOUS | Status: DC
  Administered 2014-06-28 – 2014-06-29 (×2): via INTRAVENOUS

## 2014-06-28 MED ORDER — ACETAMINOPHEN 325 MG PO TABS
650.0000 mg | ORAL_TABLET | Freq: Four times a day (QID) | ORAL | Status: DC | PRN
  Administered 2014-06-28 – 2014-06-29 (×3): 650 mg via ORAL
  Filled 2014-06-28 (×3): qty 2

## 2014-06-28 MED ORDER — POLYETHYLENE GLYCOL 3350 17 G PO PACK: 17.0000 g | PACK | Freq: Every day | ORAL | Status: DC | PRN

## 2014-06-28 MED ORDER — PHYTONADIONE 5 MG PO TABS
2.5000 mg | ORAL_TABLET | Freq: Once | ORAL | Status: AC
  Administered 2014-06-28: 2.5 mg via ORAL
  Filled 2014-06-28: qty 1

## 2014-06-28 MED ORDER — PRAVASTATIN SODIUM 40 MG PO TABS
40.0000 mg | ORAL_TABLET | Freq: Every day | ORAL | Status: DC
  Administered 2014-06-28: 40 mg via ORAL
  Filled 2014-06-28 (×2): qty 1

## 2014-06-28 MED ORDER — ALLOPURINOL 300 MG PO TABS
300.0000 mg | ORAL_TABLET | Freq: Every day | ORAL | Status: DC
  Administered 2014-06-29: 300 mg via ORAL
  Filled 2014-06-28: qty 1

## 2014-06-28 MED ORDER — SODIUM CHLORIDE 0.9 % IV SOLN: Freq: Once | INTRAVENOUS | Status: AC

## 2014-06-28 MED ORDER — ENSURE COMPLETE PO LIQD
237.0000 mL | Freq: Once | ORAL | Status: AC
  Administered 2014-06-28: 237 mL via ORAL
  Filled 2014-06-28 (×2): qty 237

## 2014-06-28 MED ORDER — DIGOXIN 125 MCG PO TABS
0.1250 mg | ORAL_TABLET | Freq: Every day | ORAL | Status: DC
  Administered 2014-06-29: 0.125 mg via ORAL
  Filled 2014-06-28: qty 1

## 2014-06-28 MED ORDER — ALUM & MAG HYDROXIDE-SIMETH 200-200-20 MG/5ML PO SUSP: 30.0000 mL | Freq: Four times a day (QID) | ORAL | Status: DC | PRN

## 2014-06-28 MED ORDER — ACETAMINOPHEN 650 MG RE SUPP: 650.0000 mg | Freq: Four times a day (QID) | RECTAL | Status: DC | PRN

## 2014-06-28 NOTE — ED Notes (Addendum)
Per pt family pt had teeth removed 8 days ago and has been having bleeding issues since. sts pain in mouth, weak, unable to eat. Pt is on blood thinners and was told 2 days ago to stop taking it. Pt sts a lot of bleeding since 2 am. Denies any pain currently.

## 2014-06-28 NOTE — ED Notes (Signed)
Attempted report 

## 2014-06-28 NOTE — ED Provider Notes (Signed)
The patient is an 80 year old male, on Coumadin for atrial fibrillation and pacemaker, had left upper and lower wisdom teeth removed, has had intermittent bleeding since, T back in place with no active bleeding at this time, hemoglobin normal, INR still elevated at 2.8, he did not take the Coumadin yesterday. Otherwise the patient appears well, will give small amount of vitamin K, can be discharged home, discussed care with family member.  Discussed with admitting doctor, Dr. Brigitte Pulse who will admit, temporary orders agreed-upon, the patient does have acute renal failure likely related to dehydration.  Medical screening examination/treatment/procedure(s) were conducted as a shared visit with non-physician practitioner(s) and myself.  I personally evaluated the patient during the encounter.  Clinical Impression:   Final diagnoses:  Acute on chronic renal failure  Dehydration         Johnna Acosta, MD 06/29/14 [phone]

## 2014-06-28 NOTE — ED Provider Notes (Signed)
CSN: [id number]     Arrival date & time 06/28/14  1202 History   First MD Initiated Contact with Patient 06/28/14 1225     Chief Complaint  Patient presents with  . Dental Pain    [name] is a 80 y.o. male with a history of atrial fibrillation anticoagulated on Coumadin, cardiac pacemaker, CHF and diabetes who presents to the ED with his wife complaining of intermittent bleeding after wisdom teeth removal 8 days ago. Patient reports intermittent bleeding since his teeth were removed. He had his left upper and lower wisdom teeth removed. Patient states he was seen by his oral surgeon yesterday morning but there was no bleeding at this time the wife states his INR at the time was 4.0. The oral surgeon stopped the Coumadin yesterday. He did not have bleeding again until 2 AM this morning. He reports bleeding mostly from his left upper jaw. He has been using gauze to control the bleeding and seems to be pulling out clots every time he is changing the gauze. Patient reports is filled up 1 or 2 cups today with spit and blood. He also reports taking Advil for pain control. He reports he'Hodges been not eating or drinking much due to all his bleeding. He denies current pain. He does report feeling weak. He also reports some increased frequency of urination over the past two days. His oral surgeon is Dr. Hardie Shackleton. He denies fevers, chills, trouble swallowing, headache,  (Consider location/radiation/quality/duration/timing/severity/associated sxs/prior Treatment) The history is provided by the patient and the spouse.    Past Medical History  Diagnosis Date  . Atrial fibrillation     Chronic  . Coronary artery disease     Status post CABG. Hx of anterior apical infarct.  . Renal insufficiency     Chronic  . Hypertension   . Hyperlipidemia   . Gout   . CHF (congestive heart failure)     with chronic ischemic cardiomypathy. EF of 10-15%.  CHF due to systolic dysfunction. Clinically doing well.  [name] GERD  (gastroesophageal reflux disease)   . Pacemaker   . Shortness of breath     "only w/CHF" (03/20/2013)  . Type II diabetes mellitus     "been off my RX for ~ 2 yr" (03/20/2013)  . H/O hiatal hernia   . Arthritis     "right thumb" (03/20/2013)  . Depression   . Anxiety   . Bladder cancer     "low grade; grade 1; non-invasive" (03/20/2013)   Past Surgical History  Procedure Laterality Date  . Transurethral resection of prostate  1990'[facility]; 2004    "twice; injected chemo 1st time; didn't do that 2nd time" (03/20/2013)  . Nasal septum surgery    . US echocardiography  04-17-2009    Est EF 10-15%  . Cardiovascular stress test  04-17-2009    EF 29%  . Coronary artery bypass graft  02/19/2003    CABG "X2" (03/20/2013)  . Carotid endarterectomy Right 1990'[facility]    "left side is still  100% blocked" (03/20/2013)  . Icd lead removal Left 03/01/2013    Procedure: ICD LEAD REMOVAL;  Surgeon: Evans Lance, MD;  Location: [facility];  Service: Cardiovascular;  Laterality: Left;  . Pacemaker lead removal Left 03/01/2013    Procedure: PACEMAKER LEAD REMOVAL;  Surgeon: Evans Lance, MD;  Location: [facility];  Service: Cardiovascular;  Laterality: Left;  . Pacemaker insertion N/A 03/01/2013    Procedure: INSERTION PACEMAKER LEAD;  Surgeon: Champ Mungo  Lovena Le, MD;  Location: [facility];  Service: Cardiovascular;  Laterality: N/A;  . Bi-ventricular pacemaker insertion (crt-p) Right 03/20/2013  . Hemorrhoid surgery      "lanced several years ago" (03/20/2013)  . Tonsillectomy and adenoidectomy  1933  . Cataract extraction w/ intraocular lens  implant, bilateral Bilateral ~ 2000   History reviewed. No pertinent family history. History  Substance Use Topics  . Smoking status: Former Smoker -- 1.00 packs/day for 17 years    Types: Cigarettes    Quit date: 02/28/1960  . Smokeless tobacco: Never Used  . Alcohol Use: Yes     Comment: 03/20/2013 "been about 2-3 months since I've had alcohol; never been a heavy drinker; was 1-2 drinks before dinner  probably 5 nights/wk"    Review of Systems  Constitutional: Negative for fever and chills.  HENT: Positive for dental problem. Negative for ear discharge, ear pain, facial swelling, nosebleeds, sore throat and trouble swallowing.   Eyes: Negative for pain.  Respiratory: Negative for cough, choking and shortness of breath.   Cardiovascular: Negative for chest pain, palpitations and leg swelling.  Gastrointestinal: Positive for nausea. Negative for vomiting, abdominal pain and diarrhea.  Genitourinary: Positive for frequency. Negative for dysuria, urgency, hematuria and decreased urine volume.  Musculoskeletal: Negative for joint swelling, neck pain and neck stiffness.  Skin: Negative for pallor and rash.  Neurological: Positive for light-headedness. Negative for dizziness, seizures, syncope, numbness and headaches.  All other systems reviewed and are negative.     Allergies  Penicillins  Home Medications   Prior to Admission medications   Medication Sig Start Date End Date Taking? Authorizing Provider  allopurinol (ZYLOPRIM) 300 MG tablet Take 300 mg by mouth daily.     Yes Historical Provider, MD  ALPRAZolam Duanne Moron) 0.5 MG tablet Take 1 mg by mouth at bedtime as needed for sleep.  04/07/11  Yes Historical Provider, MD  aspirin 81 MG tablet Take 81 mg by mouth daily.     Yes Historical Provider, MD  calcium-vitamin D (OSCAL WITH D) 500-200 MG-UNIT per tablet Take 1 tablet by mouth daily.    Yes Historical Provider, MD  Cyanocobalamin (VITAMIN B 12 PO) Take 1 tablet by mouth daily.   Yes Historical Provider, MD  digoxin (LANOXIN) 0.125 MG tablet Take 0.125 mg by mouth daily.   Yes Historical Provider, MD  diphenhydrAMINE (SOMINEX) 25 MG tablet Take 25 mg by mouth at bedtime as needed for allergies or sleep.   Yes Historical Provider, MD  furosemide (LASIX) 40 MG tablet Take 40 mg by mouth every morning.    Yes Historical Provider, MD  HYDROcodone-acetaminophen (NORCO/VICODIN) 5-325 MG  per tablet Take 1 tablet by mouth daily as needed for pain.  10/08/12  Yes Historical Provider, MD  lisinopril (PRINIVIL,ZESTRIL) 5 MG tablet Take 5 mg by mouth daily.    Yes Historical Provider, MD  loratadine (CLARITIN) 10 MG tablet Take 10 mg by mouth daily as needed for allergies.    Yes Historical Provider, MD  lovastatin (MEVACOR) 20 MG tablet Take 20 mg by mouth at bedtime.     Yes Historical Provider, MD  multivitamin-lutein (OCUVITE-LUTEIN) CAPS Take 1 capsule by mouth daily.   Yes Historical Provider, MD  warfarin (COUMADIN) 2 MG tablet Take 1-2 mg by mouth daily. Take 1 mg on Monday Wednesday and Friday.  Take 2 mg all other days of the week.   Yes Historical Provider, MD  colchicine 0.6 MG tablet Take 0.6 mg by mouth daily as  needed (for gout).    Historical Provider, MD   BP 133/62 mmHg  Pulse 60  Temp(Src) 97.6 F (36.4 C) (Oral)  Resp 13  SpO2 95% Physical Exam  Constitutional: He appears well-developed and well-nourished. No distress.  HENT:  Head: Normocephalic and atraumatic.  Right Ear: External ear normal.  Left Ear: External ear normal.  Mouth/Throat: Oropharynx is clear and moist. No oropharyngeal exudate.  Left upper and lower pits from wisdom teeth removal that not actively bleeding. Patient using tea bag between his teeth. No oropharyngeal exudate. No tonsillar hypertrophy. No evidence of induration or abscess in the mouth. No trismus noted.  Eyes: Conjunctivae are normal. Pupils are equal, round, and reactive to light. Right eye exhibits no discharge. Left eye exhibits no discharge.  Neck: Neck supple.  Cardiovascular: Normal rate, regular rhythm, normal heart sounds and intact distal pulses.  Exam reveals no gallop and no friction rub.   No murmur heard. Pulmonary/Chest: Effort normal and breath sounds normal. No respiratory distress. He has no wheezes. He has no rales.  Abdominal: Soft. There is no tenderness.  Musculoskeletal: He exhibits no edema.   Lymphadenopathy:    He has no cervical adenopathy.  Neurological: He is alert. Coordination normal.  Skin: Skin is warm and dry. No rash noted. He is not diaphoretic. No erythema. No pallor.  Psychiatric: He has a normal mood and affect. His behavior is normal.  Nursing note and vitals reviewed.   ED Course  Procedures (including critical care time) Labs Review Labs Reviewed  CBC - Abnormal; Notable for the following:    RBC 3.65 (*)    Hemoglobin 12.1 (*)    HCT 35.6 (*)    All other components within normal limits  BASIC METABOLIC PANEL - Abnormal; Notable for the following:    Sodium 131 (*)    Chloride 90 (*)    Glucose, Bld 154 (*)    BUN 87 (*)    Creatinine, Ser 3.24 (*)    GFR calc non Af Amer 16 (*)    GFR calc Af Amer 18 (*)    Anion gap 20 (*)    All other components within normal limits  PROTIME-INR - Abnormal; Notable for the following:    Prothrombin Time 29.9 (*)    INR 2.82 (*)    All other components within normal limits  CBG MONITORING, ED - Abnormal; Notable for the following:    Glucose-Capillary 167 (*)    All other components within normal limits  URINALYSIS, ROUTINE W REFLEX MICROSCOPIC    Imaging Review No results found.   EKG Interpretation None      Filed Vitals:   06/28/14 1208 06/28/14 1238 06/28/14 1300 06/28/14 1330  BP: 145/70 143/67 138/69 133/62  Pulse: 71 60 60 60  Temp: 97.6 F (36.4 C)     TempSrc: Oral     Resp: 18 15 20 13   SpO2: 98% 99% 98% 95%     MDM   Meds given in ED:  Medications  phytonadione (VITAMIN K) tablet 2.5 mg (not administered)  0.9 %  sodium chloride infusion (not administered)  0.9 %  sodium chloride infusion (not administered)  ondansetron (ZOFRAN) injection 4 mg (not administered)  feeding supplement (ENSURE COMPLETE) (ENSURE COMPLETE) liquid 237 mL (237 mLs Oral Given 06/28/14 1337)    New Prescriptions   No medications on file    Final diagnoses:  Acute on chronic renal failure   Dehydration   [name]  a 80 y.o. male with a history of atrial fibrillation anticoagulated on Coumadin, cardiac pacemaker, CHF and diabetes who presents to the ED with his wife complaining of intermittent bleeding after wisdom teeth removal 8 days ago. Bleeding has currently lost. He has Charlie Pitter from wisdom teeth removal in his left upper and lower jaw that are healing well. Patient'Hodges INR was 2.8, we'll give patient 2.5 mg of vitamin K. Patient'Hodges BMP returned with the urine of 87 and a creatinine of 3.24 with a GFR of 16. This is a significant decrease this patient'Hodges GFR from previous studies. Seems to be acute on chronic renal failure. The patient has not been eating or drinking much since his wisdom teeth removal due to pain. This is likely the cause and his acute on chronic renal failure. Will start IV hydration and patient will be admitted to [facility] floor. I spoke with the patient and his wife and they're in agreement with the plan of him staying overnight.  Patient was discussed with and evaluated by Dr. Noemi Chapel who agrees with assessment and plan.     Hanley Hays, PA-C 06/28/14 1454  Johnna Acosta, MD 06/29/14 [phone]

## 2014-06-28 NOTE — H&P (Signed)
PCP:   Donnajean Lopes, MD   Chief Complaint:  Bleeding from tooth extraction  HPI: [name] is an 80 year old white male with a history of atrial fibrillation on anticoagulation, congestive heart failure (ejection fraction 10-15%), and chronic renal insufficiency who presented to the emergency department with a complaint of bleeding from his gums. The patient underwent wisdom tooth extraction 9 days ago as an outpatient. He states that he held his Coumadin for 1 day prior to the procedure and then resumed it 2 days after the procedure. In addition, he developed some sinus congestion so he took a clindamycin prescription that he had at home. 2 nights ago, he started developing significant bleeding from his tooth extraction site. He was seen by Dr. Hardie Shackleton, oral surgery, yesterday and the bleeding resolved with Tea bags.  His INR yesterday was over 4.0. He was instructed to hold his Coumadin. This morning, he developed recurrent bleeding would not stop at home propping him to come to the emergency department where his INR is 2.82 and BUN and creatinine are 87 and 3.24, respectively.  Baseline creatinine is 1.88 in 7/14.  He had some noticed decreased urine output 2 days ago. This was followed by a significant increase in urinary frequency yesterday. He states he was urinating every hour.  He has been taking some Norco for pain and has continued on his Lasix until yesterday.  Of note, he has taken some Advil over the past several days for dental pain.  Review of Systems:  Review of Systems - all systems reviewed with the patient and are negative except as in history of present illness with following exceptions: He's had over 30 pound weight loss over the past year. Denies shortness of breath, orthopnea, or peripheral edema.   Past Medical History: Past Medical History  Diagnosis Date  . Atrial fibrillation     Chronic  . Coronary artery disease     Status post CABG. Hx of anterior apical infarct.  .  Renal insufficiency     Chronic  . Hypertension   . Hyperlipidemia   . Gout   . CHF (congestive heart failure)     with chronic ischemic cardiomypathy. EF of 10-15%.  CHF due to systolic dysfunction. Clinically doing well.  [name] GERD (gastroesophageal reflux disease)   . Pacemaker   . Shortness of breath     "only w/CHF" (03/20/2013)  . Type II diabetes mellitus     "been off my RX for ~ 2 yr" (03/20/2013)  . H/O hiatal hernia   . Arthritis     "right thumb" (03/20/2013)  . Depression   . Anxiety   . Bladder cancer     "low grade; grade 1; non-invasive" (03/20/2013)   Past Surgical History  Procedure Laterality Date  . Transurethral resection of prostate  1990's; 2004    "twice; injected chemo 1st time; didn't do that 2nd time" (03/20/2013)  . Nasal septum surgery    . US echocardiography  04-17-2009    Est EF 10-15%  . Cardiovascular stress test  04-17-2009    EF 29%  . Coronary artery bypass graft  02/19/2003    CABG "X2" (03/20/2013)  . Carotid endarterectomy Right 1990's    "left side is still  100% blocked" (03/20/2013)  . Icd lead removal Left 03/01/2013    Procedure: ICD LEAD REMOVAL;  Surgeon: Evans Lance, MD;  Location: [place];  Service: Cardiovascular;  Laterality: Left;  . Pacemaker lead removal Left 03/01/2013  Procedure: PACEMAKER LEAD REMOVAL;  Surgeon: Evans Lance, MD;  Location: [place];  Service: Cardiovascular;  Laterality: Left;  . Pacemaker insertion N/A 03/01/2013    Procedure: INSERTION PACEMAKER LEAD;  Surgeon: Evans Lance, MD;  Location: [place];  Service: Cardiovascular;  Laterality: N/A;  . Bi-ventricular pacemaker insertion (crt-p) Right 03/20/2013  . Hemorrhoid surgery      "lanced several years ago" (03/20/2013)  . Tonsillectomy and adenoidectomy  1933  . Cataract extraction w/ intraocular lens  implant, bilateral Bilateral ~ 2000    Medications: Prior to Admission medications   Medication Sig Start Date End Date Taking? Authorizing Provider  allopurinol  (ZYLOPRIM) 300 MG tablet Take 300 mg by mouth daily.     Yes Historical Provider, MD  ALPRAZolam Duanne Moron) 0.5 MG tablet Take 1 mg by mouth at bedtime as needed for sleep.  04/07/11  Yes Historical Provider, MD  aspirin 81 MG tablet Take 81 mg by mouth daily.     Yes Historical Provider, MD  calcium-vitamin D (OSCAL WITH D) 500-200 MG-UNIT per tablet Take 1 tablet by mouth daily.    Yes Historical Provider, MD  Cyanocobalamin (VITAMIN B 12 PO) Take 1 tablet by mouth daily.   Yes Historical Provider, MD  digoxin (LANOXIN) 0.125 MG tablet Take 0.125 mg by mouth daily.   Yes Historical Provider, MD  diphenhydrAMINE (SOMINEX) 25 MG tablet Take 25 mg by mouth at bedtime as needed for allergies or sleep.   Yes Historical Provider, MD  furosemide (LASIX) 40 MG tablet Take 40 mg by mouth every morning.    Yes Historical Provider, MD  HYDROcodone-acetaminophen (NORCO/VICODIN) 5-325 MG per tablet Take 1 tablet by mouth daily as needed for pain.  10/08/12  Yes Historical Provider, MD  lisinopril (PRINIVIL,ZESTRIL) 5 MG tablet Take 5 mg by mouth daily.    Yes Historical Provider, MD  loratadine (CLARITIN) 10 MG tablet Take 10 mg by mouth daily as needed for allergies.    Yes Historical Provider, MD  lovastatin (MEVACOR) 20 MG tablet Take 20 mg by mouth at bedtime.     Yes Historical Provider, MD  multivitamin-lutein (OCUVITE-LUTEIN) CAPS Take 1 capsule by mouth daily.   Yes Historical Provider, MD  warfarin (COUMADIN) 2 MG tablet Take 1-2 mg by mouth daily. Take 1 mg on Monday Wednesday and Friday.  Take 2 mg all other days of the week.   Yes Historical Provider, MD  colchicine 0.6 MG tablet Take 0.6 mg by mouth daily as needed (for gout).    Historical Provider, MD    Allergies:   Allergies  Allergen Reactions  . Penicillins Hives and Itching    Social History:  reports that he quit smoking about 54 years ago. His smoking use included Cigarettes. He has a 17 pack-year smoking history. He has never used  smokeless tobacco. He reports that he drinks alcohol. He reports that he does not use illicit drugs.  Family History: History reviewed. No pertinent family history.  Physical Exam: Filed Vitals:   06/28/14 1430 06/28/14 1440 06/28/14 1442 06/28/14 1500  BP: 141/65 131/62 131/62 132/61  Pulse: 60 60  58  Temp:   98.3 F (36.8 C)   TempSrc:   Oral   Resp: 19 17 20 13   SpO2: 96% 96% 96% 99%   General appearance: alert and no distress Head: Normocephalic, without obvious abnormality, atraumatic Eyes: conjunctivae/corneas clear. PERRL, EOM's intact.  Nose: Nares normal. Septum midline. Mucosa normal. No drainage or sinus tenderness. Throat:teabags  present in the left posterior mouth with apparent bleeding which has stopped Neck: no adenopathy, no carotid bruit, no JVD and thyroid not enlarged, symmetric, no tenderness/mass/nodules Resp: clear to auscultation bilaterally Cardio: regular rate and rhythm (paced on the monitor) GI: soft, non-tender; bowel sounds normal; no masses,  no organomegaly Extremities: extremities normal, atraumatic, no cyanosis or edema Pulses: 2+ and symmetric Lymph nodes: Cervical adenopathy: no cervical lymphadenopathy Neurologic: Alert and oriented X 3, normal strength and tone. Normal symmetric reflexes.     Labs on Admission:   Recent Labs  06/28/14 1221  NA 131*  K 5.3  CL 90*  CO2 21  GLUCOSE 154*  BUN 87*  CREATININE 3.24*  CALCIUM 8.6    Recent Labs  06/28/14 1221  WBC 10.2  HGB 12.1*  HCT 35.6*  MCV 97.5  PLT 230   No results for input(s): CKTOTAL, CKMB, CKMBINDEX, TROPONINI in the last 72 hours. Lab Results  Component Value Date   INR 2.82* 06/28/2014   INR 2.40* 03/22/2013   INR 2.15* 03/20/2013      Radiological Exams on Admission: No results found. Orders placed or performed in visit on 04/01/14  . EKG 12-Lead    Assessment/Plan 1. Acute renal failure-multifactorial secondary to volume depletion, NSAID use, and  bleeding. Will hydrate cautiously and monitor renal function. Will obtain bladder scan to rule out urinary retention given his change in urine output recently.  Place Foley if more than 250 mL post void residual.  Urinalysis is bland except for ketones suggesting volume depletion.  Hold ACE inhibitor and digoxin. 2. Atrial fibrillation- hold anticoagulation due to excessive bleeding.  Will hold digoxin in the setting of acute renal failure.   3. Chronic systolic CHF (congestive heart failure)- he appears mildly volume depleted. Will hold diuretics and hydrate gently and monitor volume status closely. Check daily weights and strict intake and output.  Hold ACE inhibitor due to acute renal failure, 4. Biventricular cardiac pacemaker in situ- currently paced. Monitor rhythm off of digoxin. 5. Warfarin-induced coagulopathy-he is received vitamin K in the emergency department. We'll hold Coumadin until his bleeding related to his dental extraction has resolved.  Monitor INR daily with reversal. 6. Disposition-possible discharge tomorrow if significant improvement in his renal function with hydration.  Marton Redwood 06/28/2014, 3:25 PM

## 2014-06-28 NOTE — ED Notes (Signed)
Updated patient that his room was ready

## 2014-06-29 DIAGNOSIS — N179 Acute kidney failure, unspecified: Secondary | ICD-10-CM | POA: Diagnosis not present

## 2014-06-29 LAB — CBC
HCT: 32.5 % — ABNORMAL LOW (ref 39.0–52.0)
Hemoglobin: 11.1 g/dL — ABNORMAL LOW (ref 13.0–17.0)
MCH: 33.2 pg (ref 26.0–34.0)
MCHC: 34.2 g/dL (ref 30.0–36.0)
MCV: 97.3 fL (ref 78.0–100.0)
Platelets: 221 10*3/uL (ref 150–400)
RBC: 3.34 MIL/uL — AB (ref 4.22–5.81)
RDW: 14.2 % (ref 11.5–15.5)
WBC: 10.5 10*3/uL (ref 4.0–10.5)

## 2014-06-29 LAB — BASIC METABOLIC PANEL
Anion gap: 16 — ABNORMAL HIGH (ref 5–15)
BUN: 77 mg/dL — ABNORMAL HIGH (ref 6–23)
CALCIUM: 8 mg/dL — AB (ref 8.4–10.5)
CO2: 22 mEq/L (ref 19–32)
CREATININE: 2.8 mg/dL — AB (ref 0.50–1.35)
Chloride: 95 mEq/L — ABNORMAL LOW (ref 96–112)
GFR calc Af Amer: 22 mL/min — ABNORMAL LOW (ref >90)
GFR, EST NON AFRICAN AMERICAN: 19 mL/min — AB (ref >90)
GLUCOSE: 152 mg/dL — AB (ref 70–99)
Potassium: 5.3 mEq/L (ref 3.7–5.3)
Sodium: 133 mEq/L — ABNORMAL LOW (ref 137–147)

## 2014-06-29 LAB — PROTIME-INR
INR: 2.42 — ABNORMAL HIGH (ref 0.00–1.49)
Prothrombin Time: 26.6 seconds — ABNORMAL HIGH (ref 11.6–15.2)

## 2014-06-29 MED ORDER — HYDROCODONE-ACETAMINOPHEN 5-325 MG PO TABS
1.0000 | ORAL_TABLET | ORAL | Status: DC | PRN
  Administered 2014-06-29: 2 via ORAL
  Filled 2014-06-29: qty 2

## 2014-06-29 MED ORDER — ACETAMINOPHEN 325 MG PO TABS: 650.0000 mg | ORAL_TABLET | Freq: Four times a day (QID) | ORAL | Status: DC | PRN

## 2014-06-29 MED ORDER — MORPHINE SULFATE 10 MG/ML IJ SOLN: 3.0000 mg/h | INTRAVENOUS | Status: DC

## 2014-06-29 NOTE — Discharge Instructions (Addendum)
Dehydration, Adult °Dehydration is when you lose more fluids from the body than you take in. Vital organs like the kidneys, brain, and heart cannot function without a proper amount of fluids and salt. Any loss of fluids from the body can cause dehydration.  °CAUSES  °· Vomiting. °· Diarrhea. °· Excessive sweating. °· Excessive urine output. °· Fever. °SYMPTOMS  °Mild dehydration °· Thirst. °· Dry lips. °· Slightly dry mouth. °Moderate dehydration °· Very dry mouth. °· Sunken eyes. °· Skin does not bounce back quickly when lightly pinched and released. °· Dark urine and decreased urine production. °· Decreased tear production. °· Headache. °Severe dehydration °· Very dry mouth. °· Extreme thirst. °· Rapid, weak pulse (more than 100 beats per minute at rest). °· Cold hands and feet. °· Not able to sweat in spite of heat and temperature. °· Rapid breathing. °· Blue lips. °· Confusion and lethargy. °· Difficulty being awakened. °· Minimal urine production. °· No tears. °DIAGNOSIS  °Your caregiver will diagnose dehydration based on your symptoms and your exam. Blood and urine tests will help confirm the diagnosis. The diagnostic evaluation should also identify the cause of dehydration. °TREATMENT  °Treatment of mild or moderate dehydration can often be done at home by increasing the amount of fluids that you drink. It is best to drink small amounts of fluid more often. Drinking too much at one time can make vomiting worse. Refer to the home care instructions below. °Severe dehydration needs to be treated at the hospital where you will probably be given intravenous (IV) fluids that contain water and electrolytes. °HOME CARE INSTRUCTIONS  °· Ask your caregiver about specific rehydration instructions. °· Drink enough fluids to keep your urine clear or pale yellow. °· Drink small amounts frequently if you have nausea and vomiting. °· Eat as you normally do. °· Avoid: °¨ Foods or drinks high in sugar. °¨ Carbonated  drinks. °¨ Juice. °¨ Extremely hot or cold fluids. °¨ Drinks with caffeine. °¨ Fatty, greasy foods. °¨ Alcohol. °¨ Tobacco. °¨ Overeating. °¨ Gelatin desserts. °· Wash your hands well to avoid spreading bacteria and viruses. °· Only take over-the-counter or prescription medicines for pain, discomfort, or fever as directed by your caregiver. °· Ask your caregiver if you should continue all prescribed and over-the-counter medicines. °· Keep all follow-up appointments with your caregiver. °SEEK MEDICAL CARE IF: °· You have abdominal pain and it increases or stays in one area (localizes). °· You have a rash, stiff neck, or severe headache. °· You are irritable, sleepy, or difficult to awaken. °· You are weak, dizzy, or extremely thirsty. °SEEK IMMEDIATE MEDICAL CARE IF:  °· You are unable to keep fluids down or you get worse despite treatment. °· You have frequent episodes of vomiting or diarrhea. °· You have blood or green matter (bile) in your vomit. °· You have blood in your stool or your stool looks black and tarry. °· You have not urinated in 6 to 8 hours, or you have only urinated a small amount of very dark urine. °· You have a fever. °· You faint. °MAKE SURE YOU:  °· Understand these instructions. °· Will watch your condition. °· Will get help right away if you are not doing well or get worse. °Document Released: 08/01/2005 Document Revised: 10/24/2011 Document Reviewed: 03/21/2011 °[employer]® Patient Information ©2015 [employer]. This information is not intended to replace advice given to you by your health care provider. Make sure you discuss any questions you have with your health care   provider.  Rehydration, Elderly Rehydration is the replacement of body fluids lost during dehydration. Dehydration is an extreme loss of body fluids to the point of body function impairment. There are many ways extreme fluid loss can occur, including vomiting, diarrhea, or excess sweating. Recovering from dehydration  requires replacing lost fluids, continuing to eat to maintain strength, and avoiding foods and beverages that may contribute to further fluid loss or may increase nausea. It is especially important for older adults to stay hydrated because dehydration can lead to dizziness and falls. Some factors that can contribute to dehydration are more common in the elderly, including the use of prescription medicines and a decreased sensation of thirst.  HOW TO REHYDRATE In most cases, rehydration involves the replacement of not only fluids but also carbohydrates and basic body salts. Rehydration with an oral rehydration solution is one way to replace essential nutrients lost through dehydration. An oral rehydration solution can be purchased at pharmacies, retail stores, and online. A general rule for staying hydrated is to drink 1-2 L of fluid per day. Talk to your caregiver about the specific amount you should be drinking each day. Drink enough fluids to keep your urine clear or pale yellow. EATING WHEN DEHYDRATED  Even if you have had severe sweating or you are having diarrhea, do not stop eating. Many healthy items in a normal diet are okay to continue eating while recovering from dehydration. The following tips can help you to lessen nausea when you eat:  Ask someone else to prepare your food. Cooking smells may worsen nausea.  Eat in a well-ventilated room away from cooking smells.  Sit up when you eat. Avoid lying down until 1-2 hours after eating.  Eat small amounts when you eat.  Eat foods that are easy to digest. These include soft, well-cooked, or mashed foods. FOODS AND BEVERAGES TO AVOID Avoid eating or drinking the following foods and beverages that may increase nausea or further loss of fluid:   Fruit juices with a high sugar content, such as concentrated juices.  Alcohol.  Beverages containing caffeine.  Carbonated drinks. They may cause a lot of gas.  Foods that may cause a lot of  gas, such as cabbage, broccoli, and beans.  Fatty, greasy, and fried foods.  Spicy, very salty, and very sweet foods or drinks.  Foods or drinks that are very hot or very cold. Consume food or drinks at or near room temperature.  Foods that need a lot of chewing, such as raw vegetables.  Foods that are sticky or hard to swallow, such as peanut butter. Document Released: 10/24/2011 Document Revised: 04/25/2012 Document Reviewed: 10/24/2011 [facility] Patient Information 2015 [place], [place]. This information is not intended to replace advice given to you by your health care provider. Make sure you discuss any questions you have with your health care provider.

## 2014-06-29 NOTE — Discharge Summary (Signed)
DISCHARGE SUMMARY  [name]  MR#: [id number]  DOB:15-[name]-1925  Date of Admission: 06/28/2014 Date of Discharge: 06/29/2014  Attending Physician:Zynasia Burklow, Gwyndolyn Saxon  Patient's IRJ:JOACZYSA,YTKZSW G, MD  Consults: None   Discharge Diagnoses: Principal Problem:   Acute on chronic renal failure   Dehydration   Acute Bleeding secodary to oral surgery and Warfarin-induced coagulopathy Active Problems:   Atrial fibrillation   Coronary artery disease   Hypertension   Hyperlipidemia   Chronic systolic CHF (congestive heart failure)   Biventricular cardiac pacemaker in situ    Past Medical History  Diagnosis Date  . Atrial fibrillation     Chronic  . Coronary artery disease     Status post CABG. Hx of anterior apical infarct.  . Renal insufficiency     Chronic  . Hypertension   . Hyperlipidemia   . Gout   . CHF (congestive heart failure)     with chronic ischemic cardiomypathy. EF of 10-15%.  CHF due to systolic dysfunction. Clinically doing well.  [name] GERD (gastroesophageal reflux disease)   . Pacemaker   . Shortness of breath     "only w/CHF" (03/20/2013)  . Type II diabetes mellitus     "been off my RX for ~ 2 yr" (03/20/2013)  . H/O hiatal hernia   . Arthritis     "right thumb" (03/20/2013)  . Depression   . Anxiety   . Bladder cancer     "low grade; grade 1; non-invasive" (03/20/2013)    Past Surgical History  Procedure Laterality Date  . Transurethral resection of prostate  1990's; 2004    "twice; injected chemo 1st time; didn't do that 2nd time" (03/20/2013)  . Nasal septum surgery    . US echocardiography  04-17-2009    Est EF 10-15%  . Cardiovascular stress test  04-17-2009    EF 29%  . Coronary artery bypass graft  02/19/2003    CABG "X2" (03/20/2013)  . Carotid endarterectomy Right 1990's    "left side is still  100% blocked" (03/20/2013)  . Icd lead removal Left 03/01/2013    Procedure: ICD LEAD REMOVAL;  Surgeon: Evans Lance, MD;  Location: [place];  Service:  Cardiovascular;  Laterality: Left;  . Pacemaker lead removal Left 03/01/2013    Procedure: PACEMAKER LEAD REMOVAL;  Surgeon: Evans Lance, MD;  Location: [place];  Service: Cardiovascular;  Laterality: Left;  . Pacemaker insertion N/A 03/01/2013    Procedure: INSERTION PACEMAKER LEAD;  Surgeon: Evans Lance, MD;  Location: [place];  Service: Cardiovascular;  Laterality: N/A;  . Bi-ventricular pacemaker insertion (crt-p) Right 03/20/2013  . Hemorrhoid surgery      "lanced several years ago" (03/20/2013)  . Tonsillectomy and adenoidectomy  1933  . Cataract extraction w/ intraocular lens  implant, bilateral Bilateral ~ 2000     Discharge Medications:   Medication List    STOP taking these medications        aspirin 81 MG tablet     furosemide 40 MG tablet  Commonly known as:  LASIX     lisinopril 5 MG tablet  Commonly known as:  PRINIVIL,ZESTRIL     warfarin 2 MG tablet  Commonly known as:  COUMADIN      TAKE these medications        acetaminophen 325 MG tablet  Commonly known as:  TYLENOL  Take 2 tablets (650 mg total) by mouth every 6 (six) hours as needed for mild pain (or Fever >/= 101).  allopurinol 300 MG tablet  Commonly known as:  ZYLOPRIM  Take 300 mg by mouth daily.     ALPRAZolam 0.5 MG tablet  Commonly known as:  XANAX  Take 1 mg by mouth at bedtime as needed for sleep.     calcium-vitamin D 500-200 MG-UNIT per tablet  Commonly known as:  OSCAL WITH D  Take 1 tablet by mouth daily.     colchicine 0.6 MG tablet  Take 0.6 mg by mouth daily as needed (for gout).     digoxin 0.125 MG tablet  Commonly known as:  LANOXIN  Take 0.125 mg by mouth daily.     diphenhydrAMINE 25 MG tablet  Commonly known as:  SOMINEX  Take 25 mg by mouth at bedtime as needed for allergies or sleep.     HYDROcodone-acetaminophen 5-325 MG per tablet  Commonly known as:  NORCO/VICODIN  Take 1 tablet by mouth daily as needed for pain.     loratadine 10 MG tablet  Commonly known  as:  CLARITIN  Take 10 mg by mouth daily as needed for allergies.     lovastatin 20 MG tablet  Commonly known as:  MEVACOR  Take 20 mg by mouth at bedtime.     multivitamin-lutein Caps capsule  Take 1 capsule by mouth daily.     VITAMIN B 12 PO  Take 1 tablet by mouth daily.        Hospital Procedures: No results found.  History of Present Illness: [name] is an 80 year old white male with a history of atrial fibrillation on anticoagulation, congestive heart failure (ejection fraction 10-15%), and chronic renal insufficiency who presented to the emergency department with a complaint of bleeding from his gums. The patient underwent wisdom tooth extraction 9 days ago as an outpatient. He states that he held his Coumadin for 1 day prior to the procedure and then resumed it 2 days after the procedure. In addition, he developed some sinus congestion so he took a clindamycin prescription that he had at home. 2 nights ago, he started developing significant bleeding from his tooth extraction site. He was seen by Dr. Hardie Shackleton, oral surgery, yesterday and the bleeding resolved with Tea bags. His INR yesterday was over 4.0. He was instructed to hold his Coumadin. This morning, he developed recurrent bleeding would not stop at home propping him to come to the emergency department where his INR is 2.82 and BUN and creatinine are 87 and 3.24, respectively. Baseline creatinine is 1.88 in 7/14. He had some noticed decreased urine output 2 days ago. This was followed by a significant increase in urinary frequency yesterday. He states he was urinating every hour. He has been taking some Norco for pain and has continued on his Lasix until yesterday. Of note, he has taken some Advil over the past several days for dental pain.  Hospital Course: [name] was admitted to a medical bed. He was hydrated gently with caution due to his congestive heart failure. His ACE inhibitor was held. With this, his creatinine  has improved from 3.24 to 2.8 on the day of discharge.  He had no further bleeding after receiving vitamin K in the emergency department. His Coumadin was held. His INR on the date of discharge remains therapeutic at 2.42. However, in the absence of ongoing bleeding will allow this to continue to drift down.  He is now tolerating liquids with adequate pain control related to his oral surgery.  Given resolution of bleeding, improved oral intake,  and improvement in his renal function he is stable for discharge home. He will be followed closely as an outpatient with repeat labs to include INR, BMET, and CBC within the next 3-4 days.  He will remain off of aspirin, Coumadin, ACE inhibitor, furosemide, and all NSAIDs until his follow-up visit.  Day of Discharge Exam BP 130/49 mmHg  Pulse 61  Temp(Src) 98.1 F (36.7 C) (Oral)  Resp 16  Ht 5\' 7"  (1.702 m)  Wt 57.7 kg (127 lb 3.3 oz)  BMI 19.92 kg/m2  SpO2 96%  Physical Exam: General appearance: alert and no distress Eyes: no scleral icterus Throat: oropharynx moist without erythema; no evidence of ongoing bleeding at oral surgery sites Resp: clear to auscultation bilaterally Cardio: regular rate and rhythm GI: soft, non-tender; bowel sounds normal; no masses,  no organomegaly Extremities: no clubbing, cyanosis or edema  Discharge Labs:  Recent Labs  06/28/14 1221 06/29/14 0547  NA 131* 133*  K 5.3 5.3  CL 90* 95*  CO2 21 22  GLUCOSE 154* 152*  BUN 87* 77*  CREATININE 3.24* 2.80*  CALCIUM 8.6 8.0*     Recent Labs  06/28/14 1221 06/29/14 0547  WBC 10.2 10.5  HGB 12.1* 11.1*  HCT 35.6* 32.5*  MCV 97.5 97.3  PLT 230 221   Lab Results  Component Value Date   INR 2.42* 06/29/2014   INR 2.82* 06/28/2014   INR 2.40* 03/22/2013    Discharge instructions:     Discharge Instructions    Diet - low sodium heart healthy    Complete by:  As directed      Discharge instructions    Complete by:  As directed   Use Tea Bags to  manage bleeding from mouth.  Stay off Coumadin and Aspirin until you see Dr. Philip Aspen.  Push fluids (water, pedialyte) to maintain hydration and stay off Lasix.  Call if increased shortness of breath, bleeding.     Increase activity slowly    Complete by:  As directed            Disposition: to home  Follow-up Appts: Follow-up with Dr[facility] at [facility] within 3-4 days. Our office will call for a transition of care visit.  Condition on Discharge: Improved  Tests Needing Follow-up: INR, CBC, and BMET Signed: Marton Redwood 06/29/2014, 9:47 AM

## 2014-07-01 ENCOUNTER — Ambulatory Visit (INDEPENDENT_AMBULATORY_CARE_PROVIDER_SITE_OTHER): Payer: Medicare Other | Admitting: *Deleted

## 2014-07-01 DIAGNOSIS — I442 Atrioventricular block, complete: Secondary | ICD-10-CM

## 2014-07-01 DIAGNOSIS — I5022 Chronic systolic (congestive) heart failure: Secondary | ICD-10-CM

## 2014-07-01 LAB — MDC_IDC_ENUM_SESS_TYPE_REMOTE
Brady Statistic AP VP Percent: 0 %
Brady Statistic AP VS Percent: 0 %
Brady Statistic AS VP Percent: 84.28 %
Brady Statistic AS VS Percent: 15.72 %
Brady Statistic RA Percent Paced: 0 %
Brady Statistic RV Percent Paced: 84.28 %
Lead Channel Impedance Value: 380 Ohm
Lead Channel Impedance Value: 4047 Ohm
Lead Channel Impedance Value: 437 Ohm
Lead Channel Impedance Value: 513 Ohm
Lead Channel Impedance Value: 570 Ohm
Lead Channel Impedance Value: 684 Ohm
Lead Channel Pacing Threshold Pulse Width: 0.4 ms
Lead Channel Sensing Intrinsic Amplitude: 0.625 mV
Lead Channel Sensing Intrinsic Amplitude: 0.625 mV
Lead Channel Sensing Intrinsic Amplitude: 6.875 mV
Lead Channel Setting Pacing Amplitude: 2 V
Lead Channel Setting Pacing Amplitude: 2.25 V
Lead Channel Setting Pacing Pulse Width: 0.4 ms
Lead Channel Setting Sensing Sensitivity: 4 mV
MDC IDC MSMT BATTERY REMAINING LONGEVITY: 71 mo
MDC IDC MSMT BATTERY VOLTAGE: 3.01 V
MDC IDC MSMT LEADCHNL LV PACING THRESHOLD AMPLITUDE: 0.625 V
MDC IDC MSMT LEADCHNL LV PACING THRESHOLD PULSEWIDTH: 0.4 ms
MDC IDC MSMT LEADCHNL RA IMPEDANCE VALUE: 4047 Ohm
MDC IDC MSMT LEADCHNL RV IMPEDANCE VALUE: 380 Ohm
MDC IDC MSMT LEADCHNL RV IMPEDANCE VALUE: 475 Ohm
MDC IDC MSMT LEADCHNL RV PACING THRESHOLD AMPLITUDE: 0.5 V
MDC IDC MSMT LEADCHNL RV SENSING INTR AMPL: 6.875 mV
MDC IDC SESS DTM: 20151117204730
MDC IDC SET LEADCHNL LV PACING PULSEWIDTH: 0.4 ms
MDC IDC SET ZONE DETECTION INTERVAL: 400 ms
Zone Setting Detection Interval: 350 ms

## 2014-07-01 NOTE — Progress Notes (Signed)
Remote pacemaker transmission.   

## 2014-07-09 ENCOUNTER — Telehealth: Payer: Self-pay | Admitting: *Deleted

## 2014-07-09 NOTE — Telephone Encounter (Signed)
Spoke to pt about recent Optivol increase seen on CRT-P. Patient denies any SOB, edema, wt gain, or CP. He states that his bloating has resolved since he was released from [place] on 11-14. During hosp. lasix was put on hold x 3 days for acute renal failure. Pt resumed on 11-17. Will inform GT of info. Pt aware that if GT recommends anything further then he will be contacted. Patient voiced understanding.

## 2014-07-24 ENCOUNTER — Encounter (HOSPITAL_COMMUNITY): Payer: Self-pay | Admitting: Internal Medicine

## 2014-08-11 ENCOUNTER — Encounter: Payer: Self-pay | Admitting: Internal Medicine

## 2014-10-01 ENCOUNTER — Ambulatory Visit (INDEPENDENT_AMBULATORY_CARE_PROVIDER_SITE_OTHER): Payer: Medicare Other | Admitting: *Deleted

## 2014-10-01 DIAGNOSIS — I442 Atrioventricular block, complete: Secondary | ICD-10-CM

## 2014-10-01 DIAGNOSIS — I5022 Chronic systolic (congestive) heart failure: Secondary | ICD-10-CM

## 2014-10-01 NOTE — Progress Notes (Signed)
Remote pacemaker transmission.   

## 2014-10-02 LAB — MDC_IDC_ENUM_SESS_TYPE_REMOTE
Battery Remaining Longevity: 70 mo
Battery Voltage: 3.01 V
Brady Statistic RA Percent Paced: 0 %
Brady Statistic RV Percent Paced: 89.7 %
Lead Channel Impedance Value: 4047 Ohm
Lead Channel Impedance Value: 4047 Ohm
Lead Channel Impedance Value: 532 Ohm
Lead Channel Impedance Value: 741 Ohm
Lead Channel Pacing Threshold Amplitude: 0.625 V
Lead Channel Pacing Threshold Pulse Width: 0.4 ms
Lead Channel Pacing Threshold Pulse Width: 0.4 ms
Lead Channel Sensing Intrinsic Amplitude: 0.625 mV
Lead Channel Sensing Intrinsic Amplitude: 0.625 mV
Lead Channel Sensing Intrinsic Amplitude: 10.375 mV
Lead Channel Sensing Intrinsic Amplitude: 10.375 mV
Lead Channel Setting Pacing Amplitude: 2 V
Lead Channel Setting Pacing Pulse Width: 0.4 ms
MDC IDC MSMT LEADCHNL LV IMPEDANCE VALUE: 418 Ohm
MDC IDC MSMT LEADCHNL LV IMPEDANCE VALUE: 475 Ohm
MDC IDC MSMT LEADCHNL LV IMPEDANCE VALUE: 608 Ohm
MDC IDC MSMT LEADCHNL LV PACING THRESHOLD AMPLITUDE: 0.625 V
MDC IDC MSMT LEADCHNL RV IMPEDANCE VALUE: 399 Ohm
MDC IDC MSMT LEADCHNL RV IMPEDANCE VALUE: 475 Ohm
MDC IDC SESS DTM: 20160217223844
MDC IDC SET LEADCHNL LV PACING AMPLITUDE: 2.25 V
MDC IDC SET LEADCHNL RV PACING PULSEWIDTH: 0.4 ms
MDC IDC SET LEADCHNL RV SENSING SENSITIVITY: 4 mV
MDC IDC SET ZONE DETECTION INTERVAL: 400 ms
MDC IDC STAT BRADY AP VP PERCENT: 0 %
MDC IDC STAT BRADY AP VS PERCENT: 0 %
MDC IDC STAT BRADY AS VP PERCENT: 89.7 %
MDC IDC STAT BRADY AS VS PERCENT: 10.3 %
Zone Setting Detection Interval: 350 ms

## 2014-10-10 ENCOUNTER — Encounter: Payer: Self-pay | Admitting: *Deleted

## 2014-10-21 ENCOUNTER — Encounter: Payer: Self-pay | Admitting: Internal Medicine

## 2014-12-31 ENCOUNTER — Ambulatory Visit (INDEPENDENT_AMBULATORY_CARE_PROVIDER_SITE_OTHER): Payer: Medicare Other | Admitting: *Deleted

## 2014-12-31 ENCOUNTER — Encounter: Payer: Self-pay | Admitting: Internal Medicine

## 2014-12-31 DIAGNOSIS — I442 Atrioventricular block, complete: Secondary | ICD-10-CM | POA: Diagnosis not present

## 2014-12-31 NOTE — Progress Notes (Signed)
Remote pacemaker transmission.   

## 2015-01-07 LAB — CUP PACEART REMOTE DEVICE CHECK
Battery Remaining Longevity: 69 mo
Battery Voltage: 3.01 V
Brady Statistic AP VP Percent: 0 %
Brady Statistic AP VS Percent: 0 %
Brady Statistic AS VP Percent: 93.76 %
Brady Statistic AS VS Percent: 6.24 %
Brady Statistic RV Percent Paced: 93.76 %
Date Time Interrogation Session: 20160518200654
Lead Channel Impedance Value: 361 Ohm
Lead Channel Impedance Value: 380 Ohm
Lead Channel Impedance Value: 4047 Ohm
Lead Channel Impedance Value: 456 Ohm
Lead Channel Impedance Value: 494 Ohm
Lead Channel Impedance Value: 684 Ohm
Lead Channel Pacing Threshold Amplitude: 0.625 V
Lead Channel Pacing Threshold Amplitude: 0.75 V
Lead Channel Pacing Threshold Pulse Width: 0.4 ms
Lead Channel Sensing Intrinsic Amplitude: 0.25 mV
Lead Channel Sensing Intrinsic Amplitude: 0.25 mV
Lead Channel Setting Sensing Sensitivity: 4 mV
MDC IDC MSMT LEADCHNL LV IMPEDANCE VALUE: 570 Ohm
MDC IDC MSMT LEADCHNL RA IMPEDANCE VALUE: 4047 Ohm
MDC IDC MSMT LEADCHNL RV IMPEDANCE VALUE: 437 Ohm
MDC IDC MSMT LEADCHNL RV PACING THRESHOLD PULSEWIDTH: 0.4 ms
MDC IDC MSMT LEADCHNL RV SENSING INTR AMPL: 10.375 mV
MDC IDC MSMT LEADCHNL RV SENSING INTR AMPL: 10.375 mV
MDC IDC SET LEADCHNL LV PACING AMPLITUDE: 2.25 V
MDC IDC SET LEADCHNL LV PACING PULSEWIDTH: 0.4 ms
MDC IDC SET LEADCHNL RV PACING AMPLITUDE: 2 V
MDC IDC SET LEADCHNL RV PACING PULSEWIDTH: 0.4 ms
MDC IDC STAT BRADY RA PERCENT PACED: 0 %
Zone Setting Detection Interval: 350 ms
Zone Setting Detection Interval: 400 ms

## 2015-01-14 ENCOUNTER — Encounter: Payer: Self-pay | Admitting: Cardiology

## 2015-04-07 ENCOUNTER — Ambulatory Visit (INDEPENDENT_AMBULATORY_CARE_PROVIDER_SITE_OTHER): Payer: Medicare Other | Admitting: Internal Medicine

## 2015-04-07 ENCOUNTER — Other Ambulatory Visit: Payer: Self-pay

## 2015-04-07 ENCOUNTER — Encounter: Payer: Self-pay | Admitting: Internal Medicine

## 2015-04-07 VITALS — BP 140/84 | HR 75 | Ht 67.0 in | Wt 129.8 lb

## 2015-04-07 DIAGNOSIS — I5022 Chronic systolic (congestive) heart failure: Secondary | ICD-10-CM

## 2015-04-07 DIAGNOSIS — I482 Chronic atrial fibrillation, unspecified: Secondary | ICD-10-CM

## 2015-04-07 DIAGNOSIS — Z950 Presence of cardiac pacemaker: Secondary | ICD-10-CM | POA: Diagnosis not present

## 2015-04-07 LAB — CUP PACEART INCLINIC DEVICE CHECK
Battery Remaining Longevity: 68 mo
Battery Voltage: 3.01 V
Brady Statistic AP VP Percent: 0 %
Brady Statistic AS VP Percent: 89.96 %
Brady Statistic RV Percent Paced: 89.96 %
Date Time Interrogation Session: 20160823130854
Lead Channel Impedance Value: 380 Ohm
Lead Channel Impedance Value: 4047 Ohm
Lead Channel Impedance Value: 4047 Ohm
Lead Channel Impedance Value: 494 Ohm
Lead Channel Impedance Value: 570 Ohm
Lead Channel Impedance Value: 684 Ohm
Lead Channel Pacing Threshold Amplitude: 0.625 V
Lead Channel Pacing Threshold Pulse Width: 0.4 ms
Lead Channel Pacing Threshold Pulse Width: 0.4 ms
Lead Channel Sensing Intrinsic Amplitude: 0.75 mV
Lead Channel Sensing Intrinsic Amplitude: 10.375 mV
Lead Channel Sensing Intrinsic Amplitude: 10.375 mV
Lead Channel Setting Pacing Amplitude: 1.75 V
Lead Channel Setting Pacing Amplitude: 2 V
Lead Channel Setting Pacing Pulse Width: 0.4 ms
Lead Channel Setting Pacing Pulse Width: 0.4 ms
Lead Channel Setting Sensing Sensitivity: 4 mV
MDC IDC MSMT LEADCHNL LV IMPEDANCE VALUE: 437 Ohm
MDC IDC MSMT LEADCHNL LV PACING THRESHOLD AMPLITUDE: 0.75 V
MDC IDC MSMT LEADCHNL RA SENSING INTR AMPL: 0.75 mV
MDC IDC MSMT LEADCHNL RV IMPEDANCE VALUE: 361 Ohm
MDC IDC MSMT LEADCHNL RV IMPEDANCE VALUE: 437 Ohm
MDC IDC SET ZONE DETECTION INTERVAL: 400 ms
MDC IDC STAT BRADY AP VS PERCENT: 0 %
MDC IDC STAT BRADY AS VS PERCENT: 10.04 %
MDC IDC STAT BRADY RA PERCENT PACED: 0 %
Zone Setting Detection Interval: 350 ms

## 2015-04-07 NOTE — Progress Notes (Signed)
HPI [name] returns today for followup. He is a very pleasant 81 yo man with an ischemic cardiomyopathy and complete heart block, who was found to have ICD pocket infection after generator change out 2 years before. He has undergone ICD system extraction, insertion of a temporary permanent transvenous pacemaker, followed by re- implantation of a biventricular pacemaker. His heart failure has gone from class III to class II. Overall he feels well and denies fevers or chills. No syncope. He remains active. He c/o having trouble with sleeping at night. He states that he will take his Xanax and sleep for 3 hours and then wake up.  Allergies  Allergen Reactions  . Penicillins Hives and Itching     Current Outpatient Prescriptions  Medication Sig Dispense Refill  . allopurinol (ZYLOPRIM) 300 MG tablet Take 300 mg by mouth daily.      [name] ALPRAZolam (XANAX) 0.5 MG tablet Take 1 mg by mouth at bedtime as needed for sleep.     . calcium-vitamin D (OSCAL WITH D) 500-200 MG-UNIT per tablet Take 1 tablet by mouth daily.     . Cyanocobalamin (VITAMIN B 12 PO) Take 1 tablet by mouth daily.    . digoxin (LANOXIN) 0.125 MG tablet Take 0.0625 mg by mouth daily.     . furosemide (LASIX) 40 MG tablet Take 40 mg by mouth daily.    [name] HYDROcodone-acetaminophen (NORCO/VICODIN) 5-325 MG per tablet Take 1 tablet by mouth daily as needed for pain.     [name] lisinopril (PRINIVIL,ZESTRIL) 2.5 MG tablet Take 2.5 mg by mouth daily.    [name] lovastatin (MEVACOR) 20 MG tablet Take 20 mg by mouth at bedtime.      . multivitamin-lutein (OCUVITE-LUTEIN) CAPS Take 1 capsule by mouth daily.    [name] warfarin (COUMADIN) 2 MG tablet Take as directed by coumadin clinic     No current facility-administered medications for this visit.     Past Medical History  Diagnosis Date  . Atrial fibrillation     Chronic  . Coronary artery disease     Status post CABG. Hx of anterior apical infarct.  . Renal insufficiency     Chronic  .  Hypertension   . Hyperlipidemia   . Gout   . CHF (congestive heart failure)     with chronic ischemic cardiomypathy. EF of 10-15%.  CHF due to systolic dysfunction. Clinically doing well.  [name] GERD (gastroesophageal reflux disease)   . Pacemaker   . Shortness of breath     "only w/CHF" (03/20/2013)  . Type II diabetes mellitus     "been off my RX for ~ 2 yr" (03/20/2013)  . H/O hiatal hernia   . Arthritis     "right thumb" (03/20/2013)  . Depression   . Anxiety   . Bladder cancer     "low grade; grade 1; non-invasive" (03/20/2013)    ROS:   All systems reviewed and negative except as noted in the HPI.   Past Surgical History  Procedure Laterality Date  . Transurethral resection of prostate  1990's; 2004    "twice; injected chemo 1st time; didn't do that 2nd time" (03/20/2013)  . Nasal septum surgery    . US echocardiography  04-17-2009    Est EF 10-15%  . Cardiovascular stress test  04-17-2009    EF 29%  . Coronary artery bypass graft  02/19/2003    CABG "X2" (03/20/2013)  . Carotid endarterectomy Right 1990's    "left side is still  100%  blocked" (03/20/2013)  . Icd lead removal Left 03/01/2013    Procedure: ICD LEAD REMOVAL;  Surgeon: Evans Lance, MD;  Location: [facility];  Service: Cardiovascular;  Laterality: Left;  . Pacemaker lead removal Left 03/01/2013    Procedure: PACEMAKER LEAD REMOVAL;  Surgeon: Evans Lance, MD;  Location: [place] [facility];  Service: Cardiovascular;  Laterality: Left;  . Pacemaker insertion N/A 03/01/2013    Procedure: INSERTION PACEMAKER LEAD;  Surgeon: Evans Lance, MD;  Location: [place];  Service: Cardiovascular;  Laterality: N/A;  . Bi-ventricular pacemaker insertion (crt-p) Right 03/20/2013  . Hemorrhoid surgery      "lanced several years ago" (03/20/2013)  . Tonsillectomy and adenoidectomy  1933  . Cataract extraction w/ intraocular lens  implant, bilateral Bilateral ~ 2000  . Bi-ventricular pacemaker insertion N/A 03/20/2013    Procedure: BI-VENTRICULAR PACEMAKER  INSERTION (CRT-P);  Surgeon: Evans Lance, MD;  Location: [facility] CATH LAB;  Service: Cardiovascular;  Laterality: N/A;     Family History  Problem Relation Age of Onset  . Heart attack Mother      Social History   Social History  . Marital Status: Married    Spouse Name: N/A  . Number of Children: 1  . Years of Education: N/A   Occupational History  .     Social History Main Topics  . Smoking status: Former Smoker -- 1.00 packs/day for 17 years    Types: Cigarettes    Quit date: 02/28/1960  . Smokeless tobacco: Never Used  . Alcohol Use: Yes     Comment: 03/20/2013 "been about 2-3 months since I've had alcohol; never been a heavy drinker; was 1-2 drinks before dinner probably 5 nights/wk"  . Drug Use: No  . Sexual Activity: Not Currently   Other Topics Concern  . Not on file   Social History Narrative     BP 140/84 mmHg  Pulse 75  Ht 5\' 7"  (1.702 m)  Wt 129 lb 12.8 oz (58.877 kg)  BMI 20.32 kg/m2  Physical Exam:  Well appearing elderly man,  NAD HEENT: Unremarkable Neck:  7 cm JVD, no thyromegally Lungs:  Clear except for basilar rales. Both incisions are well-healed. HEART:  Regular rate rhythm, no murmurs, no rubs, no clicks Abd:  soft, positive bowel sounds, no organomegally, no rebound, no guarding Ext:  2 plus pulses, no edema, no cyanosis, no clubbing Skin:  No rashes no nodules Neuro:  CN II through XII intact, motor grossly intact   Assess/Plan:

## 2015-04-07 NOTE — Assessment & Plan Note (Signed)
His symptoms remain class 2. He will continue his current meds. I have encouraged the patient to reduce his salt intake.

## 2015-04-07 NOTE — Patient Instructions (Signed)
Medication Instructions:  Your physician recommends that you continue on your current medications as directed. Please refer to the Current Medication list given to you today.   Labwork: None ordered  Testing/Procedures: None ordered  Follow-Up: Remote monitoring is used to monitor your Pacemaker from home. This monitoring reduces the number of office visits required to check your device to one time per year. It allows [place] to keep an eye on the functioning of your device to ensure it is working properly. You are scheduled for a device check from home on 07/07/15. You may send your transmission at any time that day. If you have a wireless device, the transmission will be sent automatically. After your physician reviews your transmission, you will receive a postcard with your next transmission date.   Your physician wants you to follow-up in: 12 months with Dr Knox Saliva will receive a reminder letter in the mail two months in advance. If you don't receive a letter, please call our office to schedule the follow-up appointment.   Any Other Special Instructions Will Be Listed Below (If Applicable).

## 2015-04-07 NOTE — Assessment & Plan Note (Signed)
His Medtronic BiV PPM is working normally. Will recheck in several months.

## 2015-04-07 NOTE — Assessment & Plan Note (Addendum)
His rate is controlled very nicely. He will continue his current meds. Including Warfarin

## 2015-06-16 ENCOUNTER — Other Ambulatory Visit: Payer: Self-pay

## 2015-06-16 NOTE — Patient Outreach (Signed)
[facility]) Care Management  06/16/2015  [name] 1925-04-12 [id number]   Telephone call to patient for Tier 4 referral.  Explained [facility] Care Management services to patient.  Patient immediately declined services at this time as he reports he is doing good now and feels he does not have any needs at this time.  Patient did agree to receive letter and brochure for future reference.    Plan: RN Health Coach will send patient letter and brochure. RN Health Coach will send in basket to [place] for case closure.    Jone Baseman, RN, MSN [place] [phone]

## 2015-06-16 NOTE — Patient Outreach (Signed)
[facility]) Care Management  06/16/2015  JAQUANN GUARISCO 1924-12-06 [id number]   Referral from [place], assigned Jon Billings, RN to outreach for [place] Management services.  Thanks, Ronnell Freshwater. [place], [place] Assistant Phone: [phone] Fax: [phone]

## 2015-06-23 NOTE — Patient Outreach (Signed)
[facility]) Care Management  06/23/2015  [name] [birth year] [id number]   Notification from Jon Billings, RN to close case due to patient refused [place] Management services.  Thanks, Ronnell Freshwater. [place], [place] Assistant Phone: [phone] Fax: [phone]

## 2015-07-07 ENCOUNTER — Ambulatory Visit (INDEPENDENT_AMBULATORY_CARE_PROVIDER_SITE_OTHER): Payer: Medicare Other | Admitting: *Deleted

## 2015-07-07 DIAGNOSIS — I442 Atrioventricular block, complete: Secondary | ICD-10-CM

## 2015-07-07 NOTE — Progress Notes (Signed)
Remote pacemaker transmission.   

## 2015-07-20 LAB — CUP PACEART REMOTE DEVICE CHECK
Battery Remaining Longevity: 68 mo
Brady Statistic AP VP Percent: 0 %
Brady Statistic AS VP Percent: 93.31 %
Brady Statistic AS VS Percent: 6.69 %
Brady Statistic RV Percent Paced: 93.31 %
Date Time Interrogation Session: 20161122234648
Implantable Lead Implant Date: 20140806
Implantable Lead Implant Date: 20140806
Implantable Lead Location: 753858
Lead Channel Impedance Value: 361 Ohm
Lead Channel Impedance Value: 456 Ohm
Lead Channel Impedance Value: 532 Ohm
Lead Channel Impedance Value: 589 Ohm
Lead Channel Pacing Threshold Amplitude: 0.625 V
Lead Channel Pacing Threshold Amplitude: 0.625 V
Lead Channel Pacing Threshold Pulse Width: 0.4 ms
Lead Channel Sensing Intrinsic Amplitude: 0.625 mV
Lead Channel Sensing Intrinsic Amplitude: 0.625 mV
Lead Channel Sensing Intrinsic Amplitude: 10.375 mV
Lead Channel Setting Pacing Amplitude: 1.75 V
Lead Channel Setting Pacing Amplitude: 2 V
Lead Channel Setting Pacing Pulse Width: 0.4 ms
Lead Channel Setting Pacing Pulse Width: 0.4 ms
Lead Channel Setting Sensing Sensitivity: 4 mV
MDC IDC LEAD LOCATION: 753860
MDC IDC LEAD MODEL: 4296
MDC IDC MSMT BATTERY VOLTAGE: 3.01 V
MDC IDC MSMT LEADCHNL LV IMPEDANCE VALUE: 399 Ohm
MDC IDC MSMT LEADCHNL LV IMPEDANCE VALUE: 475 Ohm
MDC IDC MSMT LEADCHNL LV IMPEDANCE VALUE: 722 Ohm
MDC IDC MSMT LEADCHNL RA IMPEDANCE VALUE: 4047 Ohm
MDC IDC MSMT LEADCHNL RA IMPEDANCE VALUE: 4047 Ohm
MDC IDC MSMT LEADCHNL RV PACING THRESHOLD PULSEWIDTH: 0.4 ms
MDC IDC MSMT LEADCHNL RV SENSING INTR AMPL: 10.375 mV
MDC IDC STAT BRADY AP VS PERCENT: 0 %
MDC IDC STAT BRADY RA PERCENT PACED: 0 %

## 2015-07-22 ENCOUNTER — Encounter: Payer: Self-pay | Admitting: Cardiology

## 2015-10-06 ENCOUNTER — Ambulatory Visit (INDEPENDENT_AMBULATORY_CARE_PROVIDER_SITE_OTHER): Payer: Medicare Other | Admitting: *Deleted

## 2015-10-06 DIAGNOSIS — I442 Atrioventricular block, complete: Secondary | ICD-10-CM

## 2015-10-07 NOTE — Progress Notes (Signed)
Remote pacemaker transmission.   

## 2015-10-13 ENCOUNTER — Other Ambulatory Visit: Payer: Self-pay | Admitting: Internal Medicine

## 2015-10-13 DIAGNOSIS — R1084 Generalized abdominal pain: Secondary | ICD-10-CM

## 2015-10-15 ENCOUNTER — Ambulatory Visit
Admission: RE | Admit: 2015-10-15 | Discharge: 2015-10-15 | Disposition: A | Discharge status: Home / Self Care | Payer: Medicare Other | Source: Ambulatory Visit | Attending: Internal Medicine | Admitting: Internal Medicine

## 2015-10-15 DIAGNOSIS — R1084 Generalized abdominal pain: Secondary | ICD-10-CM

## 2015-10-29 ENCOUNTER — Ambulatory Visit (HOSPITAL_COMMUNITY)
Admission: RE | Admit: 2015-10-29 | Discharge: 2015-10-29 | Disposition: A | Discharge status: Home / Self Care | Payer: Medicare Other | Source: Ambulatory Visit | Attending: Vascular Surgery | Admitting: Vascular Surgery

## 2015-10-29 ENCOUNTER — Ambulatory Visit (INDEPENDENT_AMBULATORY_CARE_PROVIDER_SITE_OTHER)
Admission: RE | Admit: 2015-10-29 | Discharge: 2015-10-29 | Disposition: A | Discharge status: Home / Self Care | Payer: Medicare Other | Source: Ambulatory Visit | Attending: Vascular Surgery | Admitting: Vascular Surgery

## 2015-10-29 ENCOUNTER — Other Ambulatory Visit (HOSPITAL_COMMUNITY): Payer: Self-pay | Admitting: Internal Medicine

## 2015-10-29 DIAGNOSIS — I739 Peripheral vascular disease, unspecified: Secondary | ICD-10-CM

## 2015-10-29 DIAGNOSIS — Z87891 Personal history of nicotine dependence: Secondary | ICD-10-CM | POA: Insufficient documentation

## 2015-10-29 DIAGNOSIS — I70203 Unspecified atherosclerosis of native arteries of extremities, bilateral legs: Secondary | ICD-10-CM | POA: Diagnosis not present

## 2015-10-29 DIAGNOSIS — I10 Essential (primary) hypertension: Secondary | ICD-10-CM | POA: Insufficient documentation

## 2015-10-29 DIAGNOSIS — E785 Hyperlipidemia, unspecified: Secondary | ICD-10-CM | POA: Insufficient documentation

## 2015-10-30 ENCOUNTER — Telehealth: Payer: Self-pay | Admitting: *Deleted

## 2015-10-30 ENCOUNTER — Encounter: Payer: Self-pay | Admitting: Cardiology

## 2015-10-30 LAB — CUP PACEART REMOTE DEVICE CHECK
Battery Remaining Longevity: 64 mo
Brady Statistic AP VS Percent: 0 %
Brady Statistic AS VS Percent: 8.6 %
Date Time Interrogation Session: 20170221215015
Implantable Lead Implant Date: 20140806
Implantable Lead Location: 753860
Implantable Lead Model: 5076
Lead Channel Impedance Value: 4047 Ohm
Lead Channel Impedance Value: 437 Ohm
Lead Channel Impedance Value: 437 Ohm
Lead Channel Impedance Value: 494 Ohm
Lead Channel Impedance Value: 665 Ohm
Lead Channel Pacing Threshold Pulse Width: 0.4 ms
Lead Channel Sensing Intrinsic Amplitude: 0.5 mV
Lead Channel Sensing Intrinsic Amplitude: 10.375 mV
Lead Channel Sensing Intrinsic Amplitude: 10.375 mV
Lead Channel Setting Pacing Amplitude: 2 V
Lead Channel Setting Pacing Pulse Width: 0.4 ms
Lead Channel Setting Sensing Sensitivity: 4 mV
MDC IDC LEAD IMPLANT DT: 20140806
MDC IDC LEAD LOCATION: 753858
MDC IDC LEAD MODEL: 4296
MDC IDC MSMT BATTERY VOLTAGE: 3.01 V
MDC IDC MSMT LEADCHNL LV IMPEDANCE VALUE: 361 Ohm
MDC IDC MSMT LEADCHNL LV IMPEDANCE VALUE: 551 Ohm
MDC IDC MSMT LEADCHNL LV PACING THRESHOLD AMPLITUDE: 0.75 V
MDC IDC MSMT LEADCHNL RA IMPEDANCE VALUE: 4047 Ohm
MDC IDC MSMT LEADCHNL RA SENSING INTR AMPL: 0.5 mV
MDC IDC MSMT LEADCHNL RV IMPEDANCE VALUE: 342 Ohm
MDC IDC MSMT LEADCHNL RV PACING THRESHOLD AMPLITUDE: 0.5 V
MDC IDC MSMT LEADCHNL RV PACING THRESHOLD PULSEWIDTH: 0.4 ms
MDC IDC SET LEADCHNL LV PACING AMPLITUDE: 1.75 V
MDC IDC SET LEADCHNL RV PACING PULSEWIDTH: 0.4 ms
MDC IDC STAT BRADY AP VP PERCENT: 0 %
MDC IDC STAT BRADY AS VP PERCENT: 91.4 %
MDC IDC STAT BRADY RA PERCENT PACED: 0 %
MDC IDC STAT BRADY RV PERCENT PACED: 91.4 %

## 2015-10-30 NOTE — Telephone Encounter (Signed)
Optivol unstable over the last few months shown on recent pacemaker transmission. Pt reports that he has had a 10 lb wt gain that he saw Dr. Sharlett Iles for- lasix was increased to 80mg  daily and spironolactone 25mg  daily was added. Lisinopril was discontinued as well. He reports that he has lost 7lbs recently and is making progress with his fluid retention. He declines ICM management at this time. I advised him to call back if he would like close monitoring of his fluid/HF status to avoid this situation in the future- he verbalizes understanding.

## 2015-11-24 ENCOUNTER — Emergency Department (HOSPITAL_COMMUNITY): Payer: Medicare Other

## 2015-11-24 ENCOUNTER — Emergency Department (HOSPITAL_COMMUNITY)
Admission: EM | Admit: 2015-11-24 | Discharge: 2015-11-24 | Disposition: A | Discharge status: Home / Self Care | Payer: Medicare Other | Attending: Emergency Medicine | Admitting: Emergency Medicine

## 2015-11-24 ENCOUNTER — Encounter (HOSPITAL_COMMUNITY): Payer: Self-pay | Admitting: *Deleted

## 2015-11-24 DIAGNOSIS — S0282XA Fracture of other specified skull and facial bones, left side, initial encounter for closed fracture: Secondary | ICD-10-CM | POA: Insufficient documentation

## 2015-11-24 DIAGNOSIS — Z8551 Personal history of malignant neoplasm of bladder: Secondary | ICD-10-CM | POA: Diagnosis not present

## 2015-11-24 DIAGNOSIS — Y9289 Other specified places as the place of occurrence of the external cause: Secondary | ICD-10-CM | POA: Diagnosis not present

## 2015-11-24 DIAGNOSIS — W19XXXA Unspecified fall, initial encounter: Secondary | ICD-10-CM | POA: Insufficient documentation

## 2015-11-24 DIAGNOSIS — I10 Essential (primary) hypertension: Secondary | ICD-10-CM | POA: Diagnosis not present

## 2015-11-24 DIAGNOSIS — Z79899 Other long term (current) drug therapy: Secondary | ICD-10-CM | POA: Diagnosis not present

## 2015-11-24 DIAGNOSIS — F329 Major depressive disorder, single episode, unspecified: Secondary | ICD-10-CM | POA: Diagnosis not present

## 2015-11-24 DIAGNOSIS — M109 Gout, unspecified: Secondary | ICD-10-CM | POA: Insufficient documentation

## 2015-11-24 DIAGNOSIS — Y999 Unspecified external cause status: Secondary | ICD-10-CM | POA: Diagnosis not present

## 2015-11-24 DIAGNOSIS — E119 Type 2 diabetes mellitus without complications: Secondary | ICD-10-CM | POA: Diagnosis not present

## 2015-11-24 DIAGNOSIS — I2510 Atherosclerotic heart disease of native coronary artery without angina pectoris: Secondary | ICD-10-CM | POA: Diagnosis not present

## 2015-11-24 DIAGNOSIS — I4891 Unspecified atrial fibrillation: Secondary | ICD-10-CM | POA: Diagnosis not present

## 2015-11-24 DIAGNOSIS — M19041 Primary osteoarthritis, right hand: Secondary | ICD-10-CM | POA: Diagnosis not present

## 2015-11-24 DIAGNOSIS — Z87891 Personal history of nicotine dependence: Secondary | ICD-10-CM | POA: Insufficient documentation

## 2015-11-24 DIAGNOSIS — Y9301 Activity, walking, marching and hiking: Secondary | ICD-10-CM | POA: Insufficient documentation

## 2015-11-24 DIAGNOSIS — Z7901 Long term (current) use of anticoagulants: Secondary | ICD-10-CM | POA: Insufficient documentation

## 2015-11-24 DIAGNOSIS — Z87448 Personal history of other diseases of urinary system: Secondary | ICD-10-CM | POA: Diagnosis not present

## 2015-11-24 DIAGNOSIS — S0181XA Laceration without foreign body of other part of head, initial encounter: Secondary | ICD-10-CM | POA: Diagnosis not present

## 2015-11-24 DIAGNOSIS — S0240FA Zygomatic fracture, left side, initial encounter for closed fracture: Secondary | ICD-10-CM | POA: Diagnosis not present

## 2015-11-24 DIAGNOSIS — E785 Hyperlipidemia, unspecified: Secondary | ICD-10-CM | POA: Diagnosis not present

## 2015-11-24 DIAGNOSIS — Z880 Allergy status to penicillin: Secondary | ICD-10-CM | POA: Insufficient documentation

## 2015-11-24 DIAGNOSIS — F419 Anxiety disorder, unspecified: Secondary | ICD-10-CM | POA: Insufficient documentation

## 2015-11-24 DIAGNOSIS — S0292XA Unspecified fracture of facial bones, initial encounter for closed fracture: Secondary | ICD-10-CM

## 2015-11-24 DIAGNOSIS — K219 Gastro-esophageal reflux disease without esophagitis: Secondary | ICD-10-CM | POA: Insufficient documentation

## 2015-11-24 DIAGNOSIS — I509 Heart failure, unspecified: Secondary | ICD-10-CM | POA: Diagnosis not present

## 2015-11-24 DIAGNOSIS — S0990XA Unspecified injury of head, initial encounter: Secondary | ICD-10-CM | POA: Diagnosis present

## 2015-11-24 DIAGNOSIS — Z950 Presence of cardiac pacemaker: Secondary | ICD-10-CM | POA: Diagnosis not present

## 2015-11-24 DIAGNOSIS — Z951 Presence of aortocoronary bypass graft: Secondary | ICD-10-CM | POA: Insufficient documentation

## 2015-11-24 DIAGNOSIS — S0240DA Maxillary fracture, left side, initial encounter for closed fracture: Secondary | ICD-10-CM | POA: Insufficient documentation

## 2015-11-24 LAB — PROTIME-INR
INR: 2.57 — AB (ref 0.00–1.49)
Prothrombin Time: 27.3 seconds — ABNORMAL HIGH (ref 11.6–15.2)

## 2015-11-24 LAB — BASIC METABOLIC PANEL
ANION GAP: 16 — AB (ref 5–15)
BUN: 76 mg/dL — ABNORMAL HIGH (ref 6–20)
CHLORIDE: 88 mmol/L — AB (ref 101–111)
CO2: 26 mmol/L (ref 22–32)
Calcium: 10 mg/dL (ref 8.9–10.3)
Creatinine, Ser: 2.28 mg/dL — ABNORMAL HIGH (ref 0.61–1.24)
GFR calc Af Amer: 28 mL/min — ABNORMAL LOW (ref >60)
GFR, EST NON AFRICAN AMERICAN: 24 mL/min — AB (ref >60)
GLUCOSE: 227 mg/dL — AB (ref 65–99)
POTASSIUM: 4.6 mmol/L (ref 3.5–5.1)
Sodium: 130 mmol/L — ABNORMAL LOW (ref 135–145)

## 2015-11-24 LAB — CBC
HEMATOCRIT: 47.7 % (ref 39.0–52.0)
HEMOGLOBIN: 16.3 g/dL (ref 13.0–17.0)
MCH: 33.4 pg (ref 26.0–34.0)
MCHC: 34.2 g/dL (ref 30.0–36.0)
MCV: 97.7 fL (ref 78.0–100.0)
Platelets: 188 10*3/uL (ref 150–400)
RBC: 4.88 MIL/uL (ref 4.22–5.81)
RDW: 14.5 % (ref 11.5–15.5)
WBC: 10.3 10*3/uL (ref 4.0–10.5)

## 2015-11-24 LAB — CBG MONITORING, ED: Glucose-Capillary: 246 mg/dL — ABNORMAL HIGH (ref 65–99)

## 2015-11-24 LAB — URINALYSIS, ROUTINE W REFLEX MICROSCOPIC
Bilirubin Urine: NEGATIVE
Glucose, UA: NEGATIVE mg/dL
Hgb urine dipstick: NEGATIVE
Ketones, ur: NEGATIVE mg/dL
LEUKOCYTES UA: NEGATIVE
NITRITE: NEGATIVE
PH: 6.5 (ref 5.0–8.0)
Protein, ur: NEGATIVE mg/dL
SPECIFIC GRAVITY, URINE: 1.01 (ref 1.005–1.030)

## 2015-11-24 MED ORDER — LIDOCAINE HCL (PF) 1 % IJ SOLN
5.0000 mL | Freq: Once | INTRAMUSCULAR | Status: AC
  Administered 2015-11-24: 5 mL
  Filled 2015-11-24: qty 5

## 2015-11-24 MED ORDER — TETANUS-DIPHTH-ACELL PERTUSSIS 5-2.5-18.5 LF-MCG/0.5 IM SUSP
0.5000 mL | Freq: Once | INTRAMUSCULAR | Status: AC
  Administered 2015-11-24: 0.5 mL via INTRAMUSCULAR
  Filled 2015-11-24: qty 0.5

## 2015-11-24 NOTE — ED Notes (Signed)
Assisted pt to restroom  

## 2015-11-24 NOTE — ED Notes (Signed)
Pt in  C/o 2 cm lac to L eye with 4 cm abrasion to L arm with bleeding controlled, wife states, "He wallked to the mailbox & came home with the neighbor who was helping him." pt unable to state what he hit his head on & LOC is unknown, pt takes Coumadin, pts wife reports the pt has been falling to sleep & lethargic lately & falling a lot, pt A&O x4. Pt MAE

## 2015-11-24 NOTE — Discharge Instructions (Signed)
Facial Laceration ° A facial laceration is a cut on the face. These injuries can be painful and cause bleeding. Lacerations usually heal quickly, but they need special care to reduce scarring. °DIAGNOSIS  °Your health care provider will take a medical history, ask for details about how the injury occurred, and examine the wound to determine how deep the cut is. °TREATMENT  °Some facial lacerations may not require closure. Others may not be able to be closed because of an increased risk of infection. The risk of infection and the chance for successful closure will depend on various factors, including the amount of time since the injury occurred. °The wound may be cleaned to help prevent infection. If closure is appropriate, pain medicines may be given if needed. Your health care provider will use stitches (sutures), wound glue (adhesive), or skin adhesive strips to repair the laceration. These tools bring the skin edges together to allow for faster healing and a better cosmetic outcome. If needed, you may also be given a tetanus shot. °HOME CARE INSTRUCTIONS °· Only take over-the-counter or prescription medicines as directed by your health care provider. °· Follow your health care provider's instructions for wound care. These instructions will vary depending on the technique used for closing the wound. °For Sutures: °· Keep the wound clean and dry.   °· If you were given a bandage (dressing), you should change it at least once a day. Also change the dressing if it becomes wet or dirty, or as directed by your health care provider.   °· Wash the wound with soap and water 2 times a day. Rinse the wound off with water to remove all soap. Pat the wound dry with a clean towel.   °· After cleaning, apply a thin layer of the antibiotic ointment recommended by your health care provider. This will help prevent infection and keep the dressing from sticking.   °· You may shower as usual after the first 24 hours. Do not soak the  wound in water until the sutures are removed.   °· Get your sutures removed as directed by your health care provider. With facial lacerations, sutures should usually be taken out after 4-5 days to avoid stitch marks.   °· Wait a few days after your sutures are removed before applying any makeup. °For Skin Adhesive Strips: °· Keep the wound clean and dry.   °· Do not get the skin adhesive strips wet. You may bathe carefully, using caution to keep the wound dry.   °· If the wound gets wet, pat it dry with a clean towel.   °· Skin adhesive strips will fall off on their own. You may trim the strips as the wound heals. Do not remove skin adhesive strips that are still stuck to the wound. They will fall off in time.   °For Wound Adhesive: °· You may briefly wet your wound in the shower or bath. Do not soak or scrub the wound. Do not swim. Avoid periods of heavy sweating until the skin adhesive has fallen off on its own. After showering or bathing, gently pat the wound dry with a clean towel.   °· Do not apply liquid medicine, cream medicine, ointment medicine, or makeup to your wound while the skin adhesive is in place. This may loosen the film before your wound is healed.   °· If a dressing is placed over the wound, be careful not to apply tape directly over the skin adhesive. This may cause the adhesive to be pulled off before the wound is healed.   °· Avoid   prolonged exposure to sunlight or tanning lamps while the skin adhesive is in place. °· The skin adhesive will usually remain in place for 5-10 days, then naturally fall off the skin. Do not pick at the adhesive film.   °After Healing: °Once the wound has healed, cover the wound with sunscreen during the day for 1 full year. This can help minimize scarring. Exposure to ultraviolet light in the first year will darken the scar. It can take 1-2 years for the scar to lose its redness and to heal completely.  °SEEK MEDICAL CARE IF: °· You have a fever. °SEEK IMMEDIATE  MEDICAL CARE IF: °· You have redness, pain, or swelling around the wound.   °· You see a yellowish-white fluid (pus) coming from the wound.   °  °This information is not intended to replace advice given to you by your health care provider. Make sure you discuss any questions you have with your health care provider. °  °Document Released: 09/08/2004 Document Revised: 08/22/2014 Document Reviewed: 03/14/2013 °Elsevier Interactive Patient Education ©2016 [employer] ° °

## 2015-11-24 NOTE — ED Provider Notes (Signed)
CSN: [id number]     Arrival date & time 11/24/15  1231 History   First MD Initiated Contact with Patient 11/24/15 1540     Chief Complaint  Patient presents with  . Fall  . Head Laceration   HPI Pt was walking to his mailbox.  He is not sure if he maybe tripped over his dog but he really does not know why he fell.  He landed on the ground striking his head and face.  He does not recall for how long.  A neighbor had to help him in to his house.  Pt is taking coumadin.  He has pain on the side of his face and a headache.  No fevers or chills.  No vomiting.  Family reports recent falls. Past Medical History  Diagnosis Date  . Atrial fibrillation (HCC)     Chronic  . Coronary artery disease     Status post CABG. Hx of anterior apical infarct.  . Renal insufficiency     Chronic  . Hypertension   . Hyperlipidemia   . Gout   . CHF (congestive heart failure) (HCC)     with chronic ischemic cardiomypathy. EF of 10-15%.  CHF due to systolic dysfunction. Clinically doing well.  [name] GERD (gastroesophageal reflux disease)   . Pacemaker   . Shortness of breath     "only w/CHF" (03/20/2013)  . Type II diabetes mellitus ([place])     "been off my RX for ~ 2 yr" (03/20/2013)  . H/O hiatal hernia   . Arthritis     "right thumb" (03/20/2013)  . Depression   . Anxiety   . Bladder cancer ([place])     "low grade; grade 1; non-invasive" (03/20/2013)   Past Surgical History  Procedure Laterality Date  . Transurethral resection of prostate  1990's; 2004    "twice; injected chemo 1st time; didn't do that 2nd time" (03/20/2013)  . Nasal septum surgery    . US echocardiography  04-17-2009    Est EF 10-15%  . Cardiovascular stress test  04-17-2009    EF 29%  . Coronary artery bypass graft  02/19/2003    CABG "X2" (03/20/2013)  . Carotid endarterectomy Right 1990's    "left side is still  100% blocked" (03/20/2013)  . Icd lead removal Left 03/01/2013    Procedure: ICD LEAD REMOVAL;  Surgeon: Roberto Lance, MD;  Location: [facility];  Service: Cardiovascular;  Laterality: Left;  . Pacemaker lead removal Left 03/01/2013    Procedure: PACEMAKER LEAD REMOVAL;  Surgeon: Roberto Lance, MD;  Location: [place];  Service: Cardiovascular;  Laterality: Left;  . Pacemaker insertion N/A 03/01/2013    Procedure: INSERTION PACEMAKER LEAD;  Surgeon: Roberto Lance, MD;  Location: [place];  Service: Cardiovascular;  Laterality: N/A;  . Bi-ventricular pacemaker insertion (crt-p) Right 03/20/2013  . Hemorrhoid surgery      "lanced several years ago" (03/20/2013)  . Tonsillectomy and adenoidectomy  1933  . Cataract extraction w/ intraocular lens  implant, bilateral Bilateral ~ 2000  . Bi-ventricular pacemaker insertion N/A 03/20/2013    Procedure: BI-VENTRICULAR PACEMAKER INSERTION (CRT-P);  Surgeon: Roberto Lance, MD;  Location: [facility] CATH LAB;  Service: Cardiovascular;  Laterality: N/A;   Family History  Problem Relation Age of Onset  . Heart attack Mother    Social History  Substance Use Topics  . Smoking status: Former Smoker -- 1.00 packs/day for 17 years    Types: Cigarettes    Quit date: 02/28/1960  .  Smokeless tobacco: Never Used  . Alcohol Use: Yes     Comment: 03/20/2013 "been about 2-3 months since I've had alcohol; never been a heavy drinker; was 1-2 drinks before dinner probably 5 nights/wk"    Review of Systems  All other systems reviewed and are negative.     Allergies  Penicillins  Home Medications   Prior to Admission medications   Medication Sig Start Date End Date Taking? Authorizing Provider  allopurinol (ZYLOPRIM) 300 MG tablet Take 300 mg by mouth daily.     Yes Historical Provider, MD  ALPRAZolam Duanne Moron) 0.5 MG tablet Take 1 mg by mouth at bedtime as needed for sleep.  04/07/11  Yes Historical Provider, MD  calcium-vitamin D (OSCAL WITH D) 500-200 MG-UNIT per tablet Take 1 tablet by mouth daily.    Yes Historical Provider, MD  Cyanocobalamin (VITAMIN B 12 PO) Take 1 tablet by mouth daily.   Yes Historical  Provider, MD  digoxin (LANOXIN) 0.125 MG tablet Take 0.0625 mg by mouth daily.    Yes Historical Provider, MD  furosemide (LASIX) 40 MG tablet Take 80 mg by mouth daily. 02/09/15  Yes Historical Provider, MD  HYDROcodone-acetaminophen (NORCO/VICODIN) 5-325 MG per tablet Take 1 tablet by mouth daily as needed for pain.  10/08/12  Yes Historical Provider, MD  lovastatin (MEVACOR) 20 MG tablet Take 20 mg by mouth at bedtime. Reported on 11/24/2015   Yes Historical Provider, MD  megestrol (MEGACE) 40 MG/ML suspension Take 200 mg by mouth daily.   Yes Historical Provider, MD  multivitamin-lutein (OCUVITE-LUTEIN) CAPS Take 1 capsule by mouth daily.   Yes Historical Provider, MD  naphazoline-pheniramine (NAPHCON-A) 0.025-0.3 % ophthalmic solution Place 1 drop into both eyes 4 (four) times daily as needed for irritation.   Yes Historical Provider, MD  spironolactone (ALDACTONE) 25 MG tablet Take 25 mg by mouth daily.   Yes Historical Provider, MD  warfarin (COUMADIN) 1 MG tablet Take 1-2 mg by mouth daily. Take 1mg  on mon wed and Friday. Then take 2mg  rest of days   Yes Historical Provider, MD   BP 125/62 mmHg  Pulse 59  Temp(Src) 97.3 F (36.3 C) (Axillary)  Resp 18  SpO2 98% Physical Exam  Constitutional: No distress.  HENT:  Head: Normocephalic.  Right Ear: External ear normal.  Left Ear: External ear normal.  Abrasion and laceration left face lateral to the left eye, ttp, bruising around eye and on cheek  Eyes: Conjunctivae are normal. Right eye exhibits no discharge. Left eye exhibits no discharge. No scleral icterus.  Neck: Neck supple. No tracheal deviation present.  Cardiovascular: Normal rate, regular rhythm and intact distal pulses.   Pulmonary/Chest: Effort normal and breath sounds normal. No stridor. No respiratory distress. He has no wheezes. He has no rales.  Abdominal: Soft. Bowel sounds are normal. He exhibits no distension. There is no tenderness. There is no rebound and no  guarding.  Musculoskeletal: He exhibits no edema or tenderness.  Neurological: He is alert. He has normal strength. No cranial nerve deficit (no facial droop, extraocular movements intact, no slurred speech) or sensory deficit. He exhibits normal muscle tone. He displays no seizure activity. Coordination normal.  Skin: Skin is warm and dry. No rash noted.  Psychiatric: He has a normal mood and affect.  Nursing note and vitals reviewed.   ED Course  .Marland KitchenLaceration Repair Date/Time: 11/24/2015 7:23 PM Performed by: Dorie Rank Authorized by: Dorie Rank Consent: Verbal consent obtained. Risks and benefits: risks, benefits and alternatives were  discussed Consent given by: patient Body area: head/neck Location details: left cheek Laceration length: 2 cm Contamination: The wound is contaminated. Foreign body present: gravel. Tendon involvement: none Nerve involvement: none Vascular damage: no Anesthesia: local infiltration Local anesthetic: lidocaine 1% without epinephrine Anesthetic total: 5 ml Patient sedated: no Preparation: Patient was prepped and draped in the usual sterile fashion. Irrigation solution: saline Irrigation method: syringe Amount of cleaning: standard Debridement: minimal Degree of undermining: none Wound skin closure material used: 5 0 fast absorbing gut. Approximation: close Approximation difficulty: simple Patient tolerance: Patient tolerated the procedure well with no immediate complications   (including critical care time) Labs Review Labs Reviewed  BASIC METABOLIC PANEL - Abnormal; Notable for the following:    Sodium 130 (*)    Chloride 88 (*)    Glucose, Bld 227 (*)    BUN 76 (*)    Creatinine, Ser 2.28 (*)    GFR calc non Af Amer 24 (*)    GFR calc Af Amer 28 (*)    Anion gap 16 (*)    All other components within normal limits  PROTIME-INR - Abnormal; Notable for the following:    Prothrombin Time 27.3 (*)    INR 2.57 (*)    All other  components within normal limits  CBG MONITORING, ED - Abnormal; Notable for the following:    Glucose-Capillary 246 (*)    All other components within normal limits  CBC  URINALYSIS, ROUTINE W REFLEX MICROSCOPIC (NOT AT [facility])    Imaging Review Ct Head Wo Contrast  11/24/2015  CLINICAL DATA:  82 year old male who fell while walking to the male box. Struck his left head and face on concrete. Loss of consciousness. Headache. On blood thinners. Initial encounter. EXAM: CT HEAD WITHOUT CONTRAST CT MAXILLOFACIAL WITHOUT CONTRAST TECHNIQUE: Multidetector CT imaging of the head and maxillofacial structures were performed using the standard protocol without intravenous contrast. Multiplanar CT image reconstructions of the maxillofacial structures were also generated. COMPARISON:  None available. FINDINGS: CT HEAD FINDINGS Face findings are described below. No scalp hematoma identified. Calvarium intact. Cerebral volume loss. Patchy bilateral cerebral white matter hypodensity. Small chronic appearing lacunar infarcts in the cerebellum and right thalamus. Small area of cortical encephalomalacia in the right MCA territory of the superior right frontal gyrus series 2, image 25. No midline shift, mass effect, or evidence of intracranial mass lesion. No acute intracranial hemorrhage identified. No areas of acute cortically based infarction identified. CT MAXILLOFACIAL FINDINGS Negative visualized nonco Calcified atherosclerosis at the skull base. ntrast brain parenchyma aside from cerebral volume loss. Calcified atherosclerosis at the skull base. No scalp hematoma identified. Postoperative changes to both globes. No acute orbit soft tissue abnormality identified. There is a left face subcutaneous contusion or hematoma on series 4, image 41, at an just below the zygoma. There is mild abnormal retro maxillary soft tissue density (series 4, image 53). Other visualized noncontrast deep soft tissue spaces of the face remain  normal. The mandible remains intact. There is a mildly comminuted fracture through the posterior superior wall of the left maxillary sinus (series 5, image 57). Hyperdensity throughout the left maxillary sinus therefore may be hemorrhage. The other paranasal sinuses remain clear. There is a nondisplaced fracture of the left zygomatic arch (image 54). There is a mildly comminuted fracture of the lateral wall of the left orbit. The left orbital floor appears to remain intact. No nasal bone fracture identified. No contralateral right face fractures identified. Advanced degenerative changes in the visualized  cervical spine with multilevel spondylolisthesis. IMPRESSION: 1. Mildly comminuted fractures of the left maxillary sinus posterior wall, left orbit lateral wall, left zygomatic arch. Overlying soft tissue hematoma. Hemorrhage in the left maxillary sinus. 2. No other acute facial fracture. 3. Chronic small and medium-sized vessel ischemia in the brain with no acute intracranial abnormality. Electronically Signed   By: Genevie Ann M.D.   On: 11/24/2015 14:52   Ct Maxillofacial Wo Cm  11/24/2015  CLINICAL DATA:  82 year old male who fell while walking to the male box. Struck his left head and face on concrete. Loss of consciousness. Headache. On blood thinners. Initial encounter. EXAM: CT HEAD WITHOUT CONTRAST CT MAXILLOFACIAL WITHOUT CONTRAST TECHNIQUE: Multidetector CT imaging of the head and maxillofacial structures were performed using the standard protocol without intravenous contrast. Multiplanar CT image reconstructions of the maxillofacial structures were also generated. COMPARISON:  None available. FINDINGS: CT HEAD FINDINGS Face findings are described below. No scalp hematoma identified. Calvarium intact. Cerebral volume loss. Patchy bilateral cerebral white matter hypodensity. Small chronic appearing lacunar infarcts in the cerebellum and right thalamus. Small area of cortical encephalomalacia in the right  MCA territory of the superior right frontal gyrus series 2, image 25. No midline shift, mass effect, or evidence of intracranial mass lesion. No acute intracranial hemorrhage identified. No areas of acute cortically based infarction identified. CT MAXILLOFACIAL FINDINGS Negative visualized nonco Calcified atherosclerosis at the skull base. ntrast brain parenchyma aside from cerebral volume loss. Calcified atherosclerosis at the skull base. No scalp hematoma identified. Postoperative changes to both globes. No acute orbit soft tissue abnormality identified. There is a left face subcutaneous contusion or hematoma on series 4, image 41, at an just below the zygoma. There is mild abnormal retro maxillary soft tissue density (series 4, image 53). Other visualized noncontrast deep soft tissue spaces of the face remain normal. The mandible remains intact. There is a mildly comminuted fracture through the posterior superior wall of the left maxillary sinus (series 5, image 57). Hyperdensity throughout the left maxillary sinus therefore may be hemorrhage. The other paranasal sinuses remain clear. There is a nondisplaced fracture of the left zygomatic arch (image 54). There is a mildly comminuted fracture of the lateral wall of the left orbit. The left orbital floor appears to remain intact. No nasal bone fracture identified. No contralateral right face fractures identified. Advanced degenerative changes in the visualized cervical spine with multilevel spondylolisthesis. IMPRESSION: 1. Mildly comminuted fractures of the left maxillary sinus posterior wall, left orbit lateral wall, left zygomatic arch. Overlying soft tissue hematoma. Hemorrhage in the left maxillary sinus. 2. No other acute facial fracture. 3. Chronic small and medium-sized vessel ischemia in the brain with no acute intracranial abnormality. Electronically Signed   By: Genevie Ann M.D.   On: 11/24/2015 14:52   I have personally reviewed and evaluated these  images and lab results as part of my medical decision-making.   EKG Interpretation   Date/Time:  Tuesday November 24 2015 12:36:08 EDT Ventricular Rate:  70 PR Interval:    QRS Duration: 140 QT Interval:  396 QTC Calculation: 427 R Axis:   -144 Text Interpretation:  Ventricular-paced rhythm Abnormal ECG No significant  change since last tracing Confirmed by Aadin Gaut  MD-J, Zulema Pulaski [id number]) on  11/24/2015 7:23:15 PM      MDM   Final diagnoses:  Facial laceration, initial encounter  Closed fracture of facial bone, unspecified facial bone, initial encounter [facility])    Patient is not [id number] certain but he believes  he did trip over his dog. I expressed my concerns and recommended hospitalization for observation. Patient states he is feeling fine. He has been able to walk around in the ED and does not want to be admitted. His laceration was repaired. He does not have any signs of entrapment on physical exam. I will have him follow up with the ENT surgeon.    Dorie Rank, MD 11/24/15 [phone]

## 2015-11-24 NOTE — ED Notes (Signed)
MD at bedside.[place] 

## 2015-11-27 ENCOUNTER — Encounter (HOSPITAL_COMMUNITY): Payer: Self-pay

## 2015-11-27 ENCOUNTER — Emergency Department (HOSPITAL_COMMUNITY): Payer: Medicare Other

## 2015-11-27 ENCOUNTER — Emergency Department (HOSPITAL_COMMUNITY)
Admission: EM | Admit: 2015-11-27 | Discharge: 2015-11-27 | Disposition: A | Discharge status: Home / Self Care | Payer: Medicare Other | Attending: Emergency Medicine | Admitting: Emergency Medicine

## 2015-11-27 DIAGNOSIS — I10 Essential (primary) hypertension: Secondary | ICD-10-CM | POA: Insufficient documentation

## 2015-11-27 DIAGNOSIS — K5900 Constipation, unspecified: Secondary | ICD-10-CM | POA: Insufficient documentation

## 2015-11-27 DIAGNOSIS — Z880 Allergy status to penicillin: Secondary | ICD-10-CM | POA: Diagnosis not present

## 2015-11-27 DIAGNOSIS — Y9289 Other specified places as the place of occurrence of the external cause: Secondary | ICD-10-CM | POA: Insufficient documentation

## 2015-11-27 DIAGNOSIS — F419 Anxiety disorder, unspecified: Secondary | ICD-10-CM | POA: Diagnosis not present

## 2015-11-27 DIAGNOSIS — Y9389 Activity, other specified: Secondary | ICD-10-CM | POA: Insufficient documentation

## 2015-11-27 DIAGNOSIS — I509 Heart failure, unspecified: Secondary | ICD-10-CM | POA: Insufficient documentation

## 2015-11-27 DIAGNOSIS — Z8551 Personal history of malignant neoplasm of bladder: Secondary | ICD-10-CM | POA: Insufficient documentation

## 2015-11-27 DIAGNOSIS — Z87891 Personal history of nicotine dependence: Secondary | ICD-10-CM | POA: Diagnosis not present

## 2015-11-27 DIAGNOSIS — R339 Retention of urine, unspecified: Secondary | ICD-10-CM | POA: Insufficient documentation

## 2015-11-27 DIAGNOSIS — E119 Type 2 diabetes mellitus without complications: Secondary | ICD-10-CM | POA: Insufficient documentation

## 2015-11-27 DIAGNOSIS — Y998 Other external cause status: Secondary | ICD-10-CM | POA: Diagnosis not present

## 2015-11-27 DIAGNOSIS — X58XXXA Exposure to other specified factors, initial encounter: Secondary | ICD-10-CM | POA: Diagnosis not present

## 2015-11-27 DIAGNOSIS — Z79899 Other long term (current) drug therapy: Secondary | ICD-10-CM | POA: Insufficient documentation

## 2015-11-27 DIAGNOSIS — I2510 Atherosclerotic heart disease of native coronary artery without angina pectoris: Secondary | ICD-10-CM | POA: Diagnosis not present

## 2015-11-27 DIAGNOSIS — F329 Major depressive disorder, single episode, unspecified: Secondary | ICD-10-CM | POA: Insufficient documentation

## 2015-11-27 DIAGNOSIS — E785 Hyperlipidemia, unspecified: Secondary | ICD-10-CM | POA: Insufficient documentation

## 2015-11-27 DIAGNOSIS — N289 Disorder of kidney and ureter, unspecified: Secondary | ICD-10-CM | POA: Diagnosis not present

## 2015-11-27 DIAGNOSIS — S01112A Laceration without foreign body of left eyelid and periocular area, initial encounter: Secondary | ICD-10-CM | POA: Insufficient documentation

## 2015-11-27 LAB — URINALYSIS, ROUTINE W REFLEX MICROSCOPIC
Bilirubin Urine: NEGATIVE
GLUCOSE, UA: NEGATIVE mg/dL
HGB URINE DIPSTICK: NEGATIVE
Ketones, ur: NEGATIVE mg/dL
Leukocytes, UA: NEGATIVE
Nitrite: NEGATIVE
Protein, ur: NEGATIVE mg/dL
SPECIFIC GRAVITY, URINE: 1.011 (ref 1.005–1.030)
pH: 6.5 (ref 5.0–8.0)

## 2015-11-27 LAB — COMPREHENSIVE METABOLIC PANEL
ALK PHOS: 161 U/L — AB (ref 38–126)
ALT: 38 U/L (ref 17–63)
AST: 46 U/L — ABNORMAL HIGH (ref 15–41)
Albumin: 4.9 g/dL (ref 3.5–5.0)
Anion gap: 17 — ABNORMAL HIGH (ref 5–15)
BUN: 87 mg/dL — AB (ref 6–20)
CALCIUM: 10.2 mg/dL (ref 8.9–10.3)
CO2: 23 mmol/L (ref 22–32)
CREATININE: 2.38 mg/dL — AB (ref 0.61–1.24)
Chloride: 88 mmol/L — ABNORMAL LOW (ref 101–111)
GFR calc non Af Amer: 23 mL/min — ABNORMAL LOW (ref >60)
GFR, EST AFRICAN AMERICAN: 26 mL/min — AB (ref >60)
Glucose, Bld: 296 mg/dL — ABNORMAL HIGH (ref 65–99)
Potassium: 4.7 mmol/L (ref 3.5–5.1)
SODIUM: 128 mmol/L — AB (ref 135–145)
Total Bilirubin: 2 mg/dL — ABNORMAL HIGH (ref 0.3–1.2)
Total Protein: 8.4 g/dL — ABNORMAL HIGH (ref 6.5–8.1)

## 2015-11-27 LAB — LIPASE, BLOOD: Lipase: 37 U/L (ref 11–51)

## 2015-11-27 LAB — CBC WITH DIFFERENTIAL/PLATELET
Basophils Absolute: 0 10*3/uL (ref 0.0–0.1)
Basophils Relative: 0 %
EOS ABS: 0 10*3/uL (ref 0.0–0.7)
EOS PCT: 0 %
HCT: 47.7 % (ref 39.0–52.0)
Hemoglobin: 16.3 g/dL (ref 13.0–17.0)
LYMPHS ABS: 1.1 10*3/uL (ref 0.7–4.0)
Lymphocytes Relative: 6 %
MCH: 33.4 pg (ref 26.0–34.0)
MCHC: 34.2 g/dL (ref 30.0–36.0)
MCV: 97.7 fL (ref 78.0–100.0)
MONO ABS: 1.5 10*3/uL — AB (ref 0.1–1.0)
MONOS PCT: 8 %
Neutro Abs: 14.8 10*3/uL — ABNORMAL HIGH (ref 1.7–7.7)
Neutrophils Relative %: 86 %
PLATELETS: 188 10*3/uL (ref 150–400)
RBC: 4.88 MIL/uL (ref 4.22–5.81)
RDW: 14.5 % (ref 11.5–15.5)
WBC: 17.4 10*3/uL — AB (ref 4.0–10.5)

## 2015-11-27 MED ORDER — IOPAMIDOL (ISOVUE-370) INJECTION 76%
INTRAVENOUS | Status: AC
  Filled 2015-11-27: qty 100

## 2015-11-27 MED ORDER — SODIUM CHLORIDE 0.9 % IV SOLN
INTRAVENOUS | Status: DC
  Administered 2015-11-27: 18:00:00 via INTRAVENOUS

## 2015-11-27 MED ORDER — DIATRIZOATE MEGLUMINE & SODIUM 66-10 % PO SOLN: 15.0000 mL | ORAL | Status: DC

## 2015-11-27 NOTE — ED Provider Notes (Signed)
CSN: [id number]     Arrival date & time 11/27/15  1518 History   First MD Initiated Contact with Patient 11/27/15 1614     Chief Complaint  Patient presents with  . Urinary Retention  . Constipation     (Consider location/radiation/quality/duration/timing/severity/associated sxs/prior Treatment) Patient is a 82 y.o. male presenting with constipation. The history is provided by the patient and a relative.  Constipation Associated symptoms: abdominal pain   Associated symptoms: no diarrhea, no fever, no nausea and no vomiting   Patient brought in from home with his daughter. Patient with complaint of urinary retention and constipation. Last bowel movement was yesterday and last urination was also yesterday. Patient with complaint of some rectal burning. Known to have a history of hemorrhoids. No rectal bleeding no hematuria. Patient was seen earlier this week for a fall had workup for that and laceration repair a above his left eye. No new falls.  Past Medical History  Diagnosis Date  . Atrial fibrillation (HCC)     Chronic  . Coronary artery disease     Status post CABG. Hx of anterior apical infarct.  . Renal insufficiency     Chronic  . Hypertension   . Hyperlipidemia   . Gout   . CHF (congestive heart failure) (HCC)     with chronic ischemic cardiomypathy. EF of 10-15%.  CHF due to systolic dysfunction. Clinically doing well.  [name] GERD (gastroesophageal reflux disease)   . Pacemaker   . Shortness of breath     "only w/CHF" (03/20/2013)  . Type II diabetes mellitus ([place])     "been off my RX for ~ 2 yr" (03/20/2013)  . H/O hiatal hernia   . Arthritis     "right thumb" (03/20/2013)  . Depression   . Anxiety   . Bladder cancer ([place])     "low grade; grade 1; non-invasive" (03/20/2013)   Past Surgical History  Procedure Laterality Date  . Transurethral resection of prostate  1990's; 2004    "twice; injected chemo 1st time; didn't do that 2nd time" (03/20/2013)  . Nasal septum surgery     . US echocardiography  04-17-2009    Est EF 10-15%  . Cardiovascular stress test  04-17-2009    EF 29%  . Coronary artery bypass graft  02/19/2003    CABG "X2" (03/20/2013)  . Carotid endarterectomy Right 1990's    "left side is still  100% blocked" (03/20/2013)  . Icd lead removal Left 03/01/2013    Procedure: ICD LEAD REMOVAL;  Surgeon: Evans Lance, MD;  Location: [place];  Service: Cardiovascular;  Laterality: Left;  . Pacemaker lead removal Left 03/01/2013    Procedure: PACEMAKER LEAD REMOVAL;  Surgeon: Evans Lance, MD;  Location: [place];  Service: Cardiovascular;  Laterality: Left;  . Pacemaker insertion N/A 03/01/2013    Procedure: INSERTION PACEMAKER LEAD;  Surgeon: Evans Lance, MD;  Location: [place];  Service: Cardiovascular;  Laterality: N/A;  . Bi-ventricular pacemaker insertion (crt-p) Right 03/20/2013  . Hemorrhoid surgery      "lanced several years ago" (03/20/2013)  . Tonsillectomy and adenoidectomy  1933  . Cataract extraction w/ intraocular lens  implant, bilateral Bilateral ~ 2000  . Bi-ventricular pacemaker insertion N/A 03/20/2013    Procedure: BI-VENTRICULAR PACEMAKER INSERTION (CRT-P);  Surgeon: Evans Lance, MD;  Location: [facility] CATH LAB;  Service: Cardiovascular;  Laterality: N/A;   Family History  Problem Relation Age of Onset  . Heart attack Mother    Social  History  Substance Use Topics  . Smoking status: Former Smoker -- 1.00 packs/day for 17 years    Types: Cigarettes    Quit date: 02/28/1960  . Smokeless tobacco: Never Used  . Alcohol Use: Yes     Comment: 03/20/2013 "been about 2-3 months since I've had alcohol; never been a heavy drinker; was 1-2 drinks before dinner probably 5 nights/wk"    Review of Systems  Constitutional: Negative for fever.  HENT: Negative for congestion.   Eyes: Negative for redness.  Respiratory: Negative for shortness of breath.   Cardiovascular: Negative for chest pain.  Gastrointestinal: Positive for abdominal pain and  constipation. Negative for nausea, vomiting and diarrhea.  Genitourinary: Positive for difficulty urinating. Negative for hematuria.  Skin: Positive for wound. Negative for rash.  Neurological: Negative for headaches.  Hematological: Does not bruise/bleed easily.  Psychiatric/Behavioral: Negative for confusion.      Allergies  Penicillins  Home Medications   Prior to Admission medications   Medication Sig Start Date End Date Taking? Authorizing Provider  allopurinol (ZYLOPRIM) 300 MG tablet Take 300 mg by mouth daily.     Yes Historical Provider, MD  ALPRAZolam Duanne Moron) 0.5 MG tablet Take 1 mg by mouth at bedtime.  04/07/11  Yes Historical Provider, MD  calcium-vitamin D (OSCAL WITH D) 500-200 MG-UNIT per tablet Take 1 tablet by mouth daily.    Yes Historical Provider, MD  digoxin (LANOXIN) 0.125 MG tablet Take 0.0625 mg by mouth daily.    Yes Historical Provider, MD  furosemide (LASIX) 40 MG tablet Take 80 mg by mouth daily. 02/09/15  Yes Historical Provider, MD  HYDROcodone-acetaminophen (NORCO/VICODIN) 5-325 MG per tablet Take 0.5 tablets by mouth at bedtime.  10/08/12  Yes Historical Provider, MD  ipratropium (ATROVENT) 0.03 % nasal spray Place 1-2 sprays into both nostrils 3 (three) times daily as needed for rhinitis (congestion).  11/19/15  Yes Historical Provider, MD  lovastatin (MEVACOR) 20 MG tablet Take 20 mg by mouth at bedtime. Reported on 11/24/2015   Yes Historical Provider, MD  megestrol (MEGACE) 40 MG/ML suspension Take 400 mg by mouth 2 (two) times daily. 10 mls - every morning and at 3pm   Yes Historical Provider, MD  Melatonin 10 MG TABS Take 10 mg by mouth at bedtime.   Yes Historical Provider, MD  multivitamin-lutein (OCUVITE-LUTEIN) CAPS Take 1 capsule by mouth daily.   Yes Historical Provider, MD  Naphazoline HCl (CLEAR EYES OP) Place 1 drop into both eyes daily as needed (irritation/ eye drop).   Yes Historical Provider, MD  spironolactone (ALDACTONE) 25 MG tablet Take  25 mg by mouth daily.   Yes Historical Provider, MD  Triamcinolone Acetonide (NASACORT AQ NA) Place 1 spray into both nostrils daily.   Yes Historical Provider, MD  vitamin B-12 (CYANOCOBALAMIN) 1000 MCG tablet Take 1,000 mcg by mouth daily.   Yes Historical Provider, MD  warfarin (COUMADIN) 2 MG tablet Take 1-2 mg by mouth at bedtime. Take 1/2 tablet (1 mg) by mouth Monday, Wednesday, Friday, take 1 tablet (2 mg) on Sunday, Tuesday, Thursday, Saturday 11/09/15  Yes Historical Provider, MD   BP 129/59 mmHg  Pulse 58  Temp(Src) 98.8 F (37.1 C) (Axillary)  Resp 16  SpO2 100% Physical Exam  Constitutional: He is oriented to person, place, and time. He appears well-developed and well-nourished. No distress.  HENT:  Head: Normocephalic.  Mouth/Throat: Oropharynx is clear and moist.  Old trauma surrounding the left eye with significant contusion. Laceration repair left eyebrow.  Cardiovascular: Normal rate and regular rhythm.   No murmur heard. Pulmonary/Chest: Effort normal and breath sounds normal. No respiratory distress.  Abdominal: Soft. Bowel sounds are normal. There is no tenderness.  Musculoskeletal: Normal range of motion. He exhibits no edema.  Neurological: He is alert and oriented to person, place, and time. No cranial nerve deficit. He exhibits normal muscle tone. Coordination normal.  Skin: Skin is warm.  Nursing note and vitals reviewed.   ED Course  Procedures (including critical care time) Labs Review Labs Reviewed  COMPREHENSIVE METABOLIC PANEL - Abnormal; Notable for the following:    Sodium 128 (*)    Chloride 88 (*)    Glucose, Bld 296 (*)    BUN 87 (*)    Creatinine, Ser 2.38 (*)    Total Protein 8.4 (*)    AST 46 (*)    Alkaline Phosphatase 161 (*)    Total Bilirubin 2.0 (*)    GFR calc non Af Amer 23 (*)    GFR calc Af Amer 26 (*)    Anion gap 17 (*)    All other components within normal limits  CBC WITH DIFFERENTIAL/PLATELET - Abnormal; Notable for the  following:    WBC 17.4 (*)    Neutro Abs 14.8 (*)    Monocytes Absolute 1.5 (*)    All other components within normal limits  URINALYSIS, ROUTINE W REFLEX MICROSCOPIC (NOT AT [facility])  LIPASE, BLOOD    Imaging Review Ct Abdomen Pelvis Wo Contrast  11/27/2015  CLINICAL DATA:  Acute onset of urinary retention and constipation. Rectal burning. Initial encounter. EXAM: CT ABDOMEN AND PELVIS WITHOUT CONTRAST TECHNIQUE: Multidetector CT imaging of the abdomen and pelvis was performed following the standard protocol without IV contrast. COMPARISON:  CT of the abdomen and pelvis from 10/15/2015 FINDINGS: A trace right-sided pleural effusion is noted, with mild right basilar atelectasis. Pacemaker leads are partially imaged. The liver and spleen are unremarkable in appearance. The gallbladder is within normal limits. The pancreas and adrenal glands are unremarkable. Scattered right renal cysts are seen, one of which demonstrates some peripheral calcification. Nonspecific perinephric stranding is noted bilaterally. There is no evidence of hydronephrosis. No renal or ureteral stones are identified. No free fluid is identified. The small bowel is unremarkable in appearance. The stomach is within normal limits. No acute vascular abnormalities are seen. Scattered calcification is seen along the abdominal aorta and its branches, particularly prominent along the common iliac arteries bilaterally. The appendix is normal in caliber, without evidence of appendicitis. Scattered diverticulosis is noted along the descending and sigmoid colon, without evidence of diverticulitis. The rectum is largely filled with stool, measuring 7.0 cm in transverse dimension. The bladder is mildly distended, with a Foley catheter in place. The prostate remains normal in size. No inguinal lymphadenopathy is seen. No acute osseous abnormalities are identified. Chronic bilateral pars defects are seen at L5. IMPRESSION: 1. Rectum largely filled with  stool, measuring 7.0 cm in transverse dimension, raising concern for mild fecal impaction. Would correlate with the patient's symptoms. The remainder of the colon is largely filled with stool. 2. Scattered diverticulosis along the descending and sigmoid colon, without evidence of diverticulitis. 3. Scattered right renal cysts, one of which demonstrates some peripheral calcification. Nonspecific bilateral perinephric stranding is grossly stable. 4. Previously noted ascites has resolved. 5. Residual trace right pleural effusion, with mild right basilar atelectasis. 6. Scattered calcification along the abdominal aorta and its branches, particularly prominent along the common iliac arteries bilaterally. 7. Chronic  bilateral pars defects at L5, without evidence of anterolisthesis. Electronically Signed   By: Garald Balding M.D.   On: 11/27/2015 21:56   Dg Abd Acute W/chest  11/27/2015  CLINICAL DATA:  Burning sensations in the rectal region and constipation with urine blockage. EXAM: DG ABDOMEN ACUTE W/ 1V CHEST COMPARISON:  Previous examinations. FINDINGS: The heart remains grossly normal in size. Stable post CABG changes and right subclavian pacemaker leads. Clear lungs with normal vascularity. Normal bowel gas pattern without free peritoneal air. Prominent stool throughout the colon. Mild scoliosis. Mild lumbar spine degenerative changes. Atheromatous arterial calcifications. IMPRESSION: No acute abnormality.  Prominent stool. Electronically Signed   By: Claudie Revering M.D.   On: 11/27/2015 18:22   I have personally reviewed and evaluated these images and lab results as part of my medical decision-making.   EKG Interpretation None      MDM   Final diagnoses:  Urinary retention  Constipation, unspecified constipation type  Renal insufficiency    Patient with extensive workup to include CT scan of the abdomen. Which showing some constipation just in the rectal area. Offered disimpaction but patient did  not want that. Patient also with urinary retention however only got about 4 and 50 mL of urine out with Foley. Will keep it in switch over to leg bag and have him follow-up with [facility]. Patient has been doing Barbaraann Faster asked for the past 2 days but has not been taking it with the proper amount of water. Went over instructions with his daughter. Patient also had some mild hyponatremia patient received IV normal saline here. Made another 4 mL of urine in the Foley. We'll have him follow-up with his primary care doctor for recheck of the electrolytes. Patient known to have renal insufficiency and no significant changes there. Chest x-rays negative for pneumonia pneumothorax or pulmonary edema. No fevers. Unable to explain the significant leukocytosis recommend follow-up and recheck by primary care doctor.    Fredia Sorrow, MD 11/27/15 [phone]

## 2015-11-27 NOTE — ED Notes (Signed)
CT aware pt has finished oral contrast

## 2015-11-27 NOTE — ED Notes (Signed)
Pt states they have not urinated since 0500 this morning. Also c/o not having had a BM for 2 days.

## 2015-11-27 NOTE — Discharge Instructions (Signed)
Continue the [facility] asked. Foley switched over to a leg bag. Call urology on Monday for  follow-up. Continue the [facility] asked daily for the constipation. Follow-up irregular doctor to have your sodium and electrolytes rechecked. Return for any new or worse symptoms.

## 2015-11-27 NOTE — ED Notes (Signed)
All interventions done by [facility] done under supervision of Carlyn Reichert RN.

## 2015-11-27 NOTE — ED Notes (Signed)
Pt reports he is here with c/o urinary retention and constipation. LBM was yesterday and last urination was also yesterday. He also reports rectal burning. Denies rectal bleeding or hematuria. Was seen here earlier this week for a fall and has hematoma and laceration to left eye and had sutures placed.

## 2015-12-04 ENCOUNTER — Emergency Department (HOSPITAL_COMMUNITY): Payer: Medicare Other

## 2015-12-04 ENCOUNTER — Encounter (HOSPITAL_COMMUNITY): Payer: Self-pay | Admitting: Emergency Medicine

## 2015-12-04 ENCOUNTER — Inpatient Hospital Stay (HOSPITAL_COMMUNITY)
Admission: EM | Admit: 2015-12-04 | Discharge: 2015-12-11 | DRG: 813 | Disposition: A | Discharge status: Rehabilitation Facility | Payer: Medicare Other | Attending: Internal Medicine | Admitting: Internal Medicine

## 2015-12-04 DIAGNOSIS — I82611 Acute embolism and thrombosis of superficial veins of right upper extremity: Secondary | ICD-10-CM | POA: Diagnosis not present

## 2015-12-04 DIAGNOSIS — D62 Acute posthemorrhagic anemia: Secondary | ICD-10-CM | POA: Diagnosis present

## 2015-12-04 DIAGNOSIS — Z950 Presence of cardiac pacemaker: Secondary | ICD-10-CM

## 2015-12-04 DIAGNOSIS — I482 Chronic atrial fibrillation: Secondary | ICD-10-CM | POA: Diagnosis present

## 2015-12-04 DIAGNOSIS — E872 Acidosis: Secondary | ICD-10-CM | POA: Diagnosis present

## 2015-12-04 DIAGNOSIS — E1122 Type 2 diabetes mellitus with diabetic chronic kidney disease: Secondary | ICD-10-CM | POA: Diagnosis present

## 2015-12-04 DIAGNOSIS — N179 Acute kidney failure, unspecified: Secondary | ICD-10-CM | POA: Diagnosis present

## 2015-12-04 DIAGNOSIS — K626 Ulcer of anus and rectum: Secondary | ICD-10-CM | POA: Diagnosis present

## 2015-12-04 DIAGNOSIS — E119 Type 2 diabetes mellitus without complications: Secondary | ICD-10-CM | POA: Diagnosis present

## 2015-12-04 DIAGNOSIS — Z9221 Personal history of antineoplastic chemotherapy: Secondary | ICD-10-CM

## 2015-12-04 DIAGNOSIS — Z681 Body mass index (BMI) 19 or less, adult: Secondary | ICD-10-CM

## 2015-12-04 DIAGNOSIS — M109 Gout, unspecified: Secondary | ICD-10-CM | POA: Diagnosis present

## 2015-12-04 DIAGNOSIS — Z880 Allergy status to penicillin: Secondary | ICD-10-CM

## 2015-12-04 DIAGNOSIS — I252 Old myocardial infarction: Secondary | ICD-10-CM

## 2015-12-04 DIAGNOSIS — I255 Ischemic cardiomyopathy: Secondary | ICD-10-CM | POA: Diagnosis present

## 2015-12-04 DIAGNOSIS — E875 Hyperkalemia: Secondary | ICD-10-CM | POA: Diagnosis present

## 2015-12-04 DIAGNOSIS — I2510 Atherosclerotic heart disease of native coronary artery without angina pectoris: Secondary | ICD-10-CM | POA: Diagnosis present

## 2015-12-04 DIAGNOSIS — I82409 Acute embolism and thrombosis of unspecified deep veins of unspecified lower extremity: Secondary | ICD-10-CM

## 2015-12-04 DIAGNOSIS — R0602 Shortness of breath: Secondary | ICD-10-CM

## 2015-12-04 DIAGNOSIS — Z7951 Long term (current) use of inhaled steroids: Secondary | ICD-10-CM

## 2015-12-04 DIAGNOSIS — K5730 Diverticulosis of large intestine without perforation or abscess without bleeding: Secondary | ICD-10-CM | POA: Diagnosis present

## 2015-12-04 DIAGNOSIS — N183 Chronic kidney disease, stage 3 unspecified: Secondary | ICD-10-CM | POA: Diagnosis present

## 2015-12-04 DIAGNOSIS — E871 Hypo-osmolality and hyponatremia: Secondary | ICD-10-CM | POA: Diagnosis present

## 2015-12-04 DIAGNOSIS — I5043 Acute on chronic combined systolic (congestive) and diastolic (congestive) heart failure: Secondary | ICD-10-CM | POA: Diagnosis present

## 2015-12-04 DIAGNOSIS — Z9181 History of falling: Secondary | ICD-10-CM

## 2015-12-04 DIAGNOSIS — I480 Paroxysmal atrial fibrillation: Secondary | ICD-10-CM | POA: Diagnosis present

## 2015-12-04 DIAGNOSIS — I442 Atrioventricular block, complete: Secondary | ICD-10-CM | POA: Diagnosis present

## 2015-12-04 DIAGNOSIS — N390 Urinary tract infection, site not specified: Secondary | ICD-10-CM | POA: Diagnosis present

## 2015-12-04 DIAGNOSIS — Z87891 Personal history of nicotine dependence: Secondary | ICD-10-CM

## 2015-12-04 DIAGNOSIS — I130 Hypertensive heart and chronic kidney disease with heart failure and stage 1 through stage 4 chronic kidney disease, or unspecified chronic kidney disease: Secondary | ICD-10-CM | POA: Diagnosis present

## 2015-12-04 DIAGNOSIS — Z8551 Personal history of malignant neoplasm of bladder: Secondary | ICD-10-CM

## 2015-12-04 DIAGNOSIS — Z7901 Long term (current) use of anticoagulants: Secondary | ICD-10-CM

## 2015-12-04 DIAGNOSIS — E43 Unspecified severe protein-calorie malnutrition: Secondary | ICD-10-CM | POA: Diagnosis present

## 2015-12-04 DIAGNOSIS — M6281 Muscle weakness (generalized): Secondary | ICD-10-CM | POA: Diagnosis present

## 2015-12-04 DIAGNOSIS — T45515A Adverse effect of anticoagulants, initial encounter: Secondary | ICD-10-CM | POA: Diagnosis present

## 2015-12-04 DIAGNOSIS — E785 Hyperlipidemia, unspecified: Secondary | ICD-10-CM | POA: Diagnosis present

## 2015-12-04 DIAGNOSIS — D6832 Hemorrhagic disorder due to extrinsic circulating anticoagulants: Principal | ICD-10-CM | POA: Diagnosis present

## 2015-12-04 DIAGNOSIS — D696 Thrombocytopenia, unspecified: Secondary | ICD-10-CM | POA: Diagnosis present

## 2015-12-04 DIAGNOSIS — Z794 Long term (current) use of insulin: Secondary | ICD-10-CM

## 2015-12-04 DIAGNOSIS — K922 Gastrointestinal hemorrhage, unspecified: Secondary | ICD-10-CM | POA: Diagnosis present

## 2015-12-04 DIAGNOSIS — I248 Other forms of acute ischemic heart disease: Secondary | ICD-10-CM | POA: Diagnosis present

## 2015-12-04 DIAGNOSIS — Z7982 Long term (current) use of aspirin: Secondary | ICD-10-CM

## 2015-12-04 DIAGNOSIS — R578 Other shock: Secondary | ICD-10-CM | POA: Diagnosis present

## 2015-12-04 DIAGNOSIS — K5900 Constipation, unspecified: Secondary | ICD-10-CM | POA: Diagnosis present

## 2015-12-04 DIAGNOSIS — I509 Heart failure, unspecified: Secondary | ICD-10-CM

## 2015-12-04 DIAGNOSIS — Z951 Presence of aortocoronary bypass graft: Secondary | ICD-10-CM

## 2015-12-04 DIAGNOSIS — K219 Gastro-esophageal reflux disease without esophagitis: Secondary | ICD-10-CM | POA: Diagnosis present

## 2015-12-04 DIAGNOSIS — B9620 Unspecified Escherichia coli [E. coli] as the cause of diseases classified elsewhere: Secondary | ICD-10-CM | POA: Diagnosis present

## 2015-12-04 HISTORY — DX: Chronic kidney disease, stage 3 unspecified: N18.30

## 2015-12-04 HISTORY — DX: Chronic kidney disease, stage 3 (moderate): N18.3

## 2015-12-04 MED ORDER — SODIUM CHLORIDE 0.9 % IV SOLN
80.0000 mg | Freq: Once | INTRAVENOUS | Status: AC
  Administered 2015-12-04: 80 mg via INTRAVENOUS
  Filled 2015-12-04: qty 80

## 2015-12-04 MED ORDER — SODIUM CHLORIDE 0.9 % IV SOLN
10.0000 mL/h | Freq: Once | INTRAVENOUS | Status: AC
  Administered 2015-12-05: 10 mL/h via INTRAVENOUS

## 2015-12-04 MED ORDER — VITAMIN K1 10 MG/ML IJ SOLN
10.0000 mg | Freq: Once | INTRAVENOUS | Status: DC
  Administered 2015-12-05: 5 mg via INTRAVENOUS
  Filled 2015-12-04 (×2): qty 1

## 2015-12-04 MED ORDER — SODIUM CHLORIDE 0.9 % IV BOLUS (SEPSIS)
1000.0000 mL | Freq: Once | INTRAVENOUS | Status: AC
  Administered 2015-12-04: 1000 mL via INTRAVENOUS

## 2015-12-04 NOTE — ED Provider Notes (Signed)
CSN: [id number]     Arrival date & time 12/04/15  2324 History  By signing my name below, I, Irene Pap, attest that this documentation has been prepared under the direction and in the presence of Everlene Balls, MD. Electronically Signed: Irene Pap, ED Scribe. 12/04/2015. 1:06 AM.  Chief Complaint  Patient presents with  . GI Bleeding   The history is provided by the EMS personnel. No language interpreter was used.   HPI Comments (Level 5 Caveat due to acuity of condition): [name] is a 82 y.o. male with a hx of A-Fib, coronary artery disease, HTN, renal insufficiency, CHF, Type II DM, pacemaker placement, and bladder cancer brought in by EMS who presents to the Emergency Department complaining of acute onset rectal bleeding onset 2 hours ago. EMS predicts that pt experienced 200-300 cc loss of blood in the toilet and bathroom floor. Pt is on blood thinners. Per EMS, pt has a hx of hemorrhoid issues in the past but has never experienced this much bleeding. Pt was responsive to 500 ml bolus fluid given en route. Per EMS, pt fell one week ago, has been having difficulty urinating and was dealing with constipation that has since resolved. Pt denied nausea, vomiting, recent fever or cough.   Past Medical History  Diagnosis Date  . Atrial fibrillation (HCC)     Chronic  . Coronary artery disease     Status post CABG. Hx of anterior apical infarct.  . Renal insufficiency     Chronic  . Hypertension   . Hyperlipidemia   . Gout   . CHF (congestive heart failure) (HCC)     with chronic ischemic cardiomypathy. EF of 10-15%.  CHF due to systolic dysfunction. Clinically doing well.  [name] GERD (gastroesophageal reflux disease)   . Pacemaker   . Shortness of breath     "only w/CHF" (03/20/2013)  . Type II diabetes mellitus ([place])     "been off my RX for ~ 2 yr" (03/20/2013)  . H/O hiatal hernia   . Arthritis     "right thumb" (03/20/2013)  . Depression   . Anxiety   . Bladder cancer ([place])      "low grade; grade 1; non-invasive" (03/20/2013)   Past Surgical History  Procedure Laterality Date  . Transurethral resection of prostate  1990's; 2004    "twice; injected chemo 1st time; didn't do that 2nd time" (03/20/2013)  . Nasal septum surgery    . US echocardiography  04-17-2009    Est EF 10-15%  . Cardiovascular stress test  04-17-2009    EF 29%  . Coronary artery bypass graft  02/19/2003    CABG "X2" (03/20/2013)  . Carotid endarterectomy Right 1990's    "left side is still  100% blocked" (03/20/2013)  . Icd lead removal Left 03/01/2013    Procedure: ICD LEAD REMOVAL;  Surgeon: Evans Lance, MD;  Location: [place];  Service: Cardiovascular;  Laterality: Left;  . Pacemaker lead removal Left 03/01/2013    Procedure: PACEMAKER LEAD REMOVAL;  Surgeon: Evans Lance, MD;  Location: [place];  Service: Cardiovascular;  Laterality: Left;  . Pacemaker insertion N/A 03/01/2013    Procedure: INSERTION PACEMAKER LEAD;  Surgeon: Evans Lance, MD;  Location: [place];  Service: Cardiovascular;  Laterality: N/A;  . Bi-ventricular pacemaker insertion (crt-p) Right 03/20/2013  . Hemorrhoid surgery      "lanced several years ago" (03/20/2013)  . Tonsillectomy and adenoidectomy  1933  . Cataract extraction w/ intraocular  lens  implant, bilateral Bilateral ~ 2000  . Bi-ventricular pacemaker insertion N/A 03/20/2013    Procedure: BI-VENTRICULAR PACEMAKER INSERTION (CRT-P);  Surgeon: Evans Lance, MD;  Location: [facility] CATH LAB;  Service: Cardiovascular;  Laterality: N/A;   Family History  Problem Relation Age of Onset  . Heart attack Mother    Social History  Substance Use Topics  . Smoking status: Former Smoker -- 1.00 packs/day for 17 years    Types: Cigarettes    Quit date: 02/28/1960  . Smokeless tobacco: Never Used  . Alcohol Use: Yes     Comment: 03/20/2013 "been about 2-3 months since I've had alcohol; never been a heavy drinker; was 1-2 drinks before dinner probably 5 nights/wk"    Review of Systems   Unable to perform ROS: Acuity of condition   Allergies  Penicillins  Home Medications   Prior to Admission medications   Medication Sig Start Date End Date Taking? Authorizing Provider  allopurinol (ZYLOPRIM) 300 MG tablet Take 300 mg by mouth daily.      Historical Provider, MD  ALPRAZolam Duanne Moron) 0.5 MG tablet Take 1 mg by mouth at bedtime.  04/07/11   Historical Provider, MD  calcium-vitamin D (OSCAL WITH D) 500-200 MG-UNIT per tablet Take 1 tablet by mouth daily.     Historical Provider, MD  digoxin (LANOXIN) 0.125 MG tablet Take 0.0625 mg by mouth daily.     Historical Provider, MD  furosemide (LASIX) 40 MG tablet Take 80 mg by mouth daily. 02/09/15   Historical Provider, MD  HYDROcodone-acetaminophen (NORCO/VICODIN) 5-325 MG per tablet Take 0.5 tablets by mouth at bedtime.  10/08/12   Historical Provider, MD  ipratropium (ATROVENT) 0.03 % nasal spray Place 1-2 sprays into both nostrils 3 (three) times daily as needed for rhinitis (congestion).  11/19/15   Historical Provider, MD  lovastatin (MEVACOR) 20 MG tablet Take 20 mg by mouth at bedtime. Reported on 11/24/2015    Historical Provider, MD  megestrol (MEGACE) 40 MG/ML suspension Take 400 mg by mouth 2 (two) times daily. 10 mls - every morning and at 3pm    Historical Provider, MD  Melatonin 10 MG TABS Take 10 mg by mouth at bedtime.    Historical Provider, MD  multivitamin-lutein [facility]) CAPS Take 1 capsule by mouth daily.    Historical Provider, MD  Naphazoline HCl (CLEAR EYES OP) Place 1 drop into both eyes daily as needed (irritation/ eye drop).    Historical Provider, MD  spironolactone (ALDACTONE) 25 MG tablet Take 25 mg by mouth daily.    Historical Provider, MD  Triamcinolone Acetonide (NASACORT AQ NA) Place 1 spray into both nostrils daily.    Historical Provider, MD  vitamin B-12 (CYANOCOBALAMIN) 1000 MCG tablet Take 1,000 mcg by mouth daily.    Historical Provider, MD  warfarin (COUMADIN) 2 MG tablet Take 1-2 mg by  mouth at bedtime. Take 1/2 tablet (1 mg) by mouth Monday, Wednesday, Friday, take 1 tablet (2 mg) on Sunday, Tuesday, Thursday, Saturday 11/09/15   Historical Provider, MD   BP 108/69 mmHg  Pulse 80  Temp(Src) 97.4 F (36.3 C) (Axillary)  Resp 21  SpO2 96% Physical Exam  Constitutional: Vital signs are normal. He appears well-developed.  Non-toxic appearance. He does not appear ill. He appears distressed.  HENT:  Head: Normocephalic.  Nose: Nose normal.  Mouth/Throat: Oropharynx is clear and moist. No oropharyngeal exudate.  Eyes: Conjunctivae and EOM are normal. Pupils are equal, round, and reactive to light. No scleral icterus.  Left periorbital ecchymosis with no tenderness to palpation  Neck: Normal range of motion. Neck supple. No tracheal deviation, no edema, no erythema and normal range of motion present. No thyroid mass and no thyromegaly present.  Cardiovascular: Regular rhythm, S1 normal, S2 normal, normal heart sounds, intact distal pulses and normal pulses.  Tachycardia present.  Exam reveals no gallop and no friction rub.   No murmur heard. Pulmonary/Chest: Effort normal and breath sounds normal. No respiratory distress. He has no wheezes. He has no rhonchi. He has no rales.  AICD pacemaker in right chest  Abdominal: Soft. Normal appearance and bowel sounds are normal. He exhibits no distension, no ascites and no mass. There is no hepatosplenomegaly. There is no tenderness. There is no rebound, no guarding and no CVA tenderness.  Genitourinary: Guaiac positive stool.  Gross bright red blood oozing from the rectum  Musculoskeletal: Normal range of motion. He exhibits no edema or tenderness.  Lymphadenopathy:    He has no cervical adenopathy.  Neurological: He is alert. He has normal strength. No cranial nerve deficit or sensory deficit.  Skin: Skin is warm, dry and intact. No petechiae and no rash noted. He is not diaphoretic. No erythema. There is pallor.  Psychiatric: He has  a normal mood and affect. His behavior is normal. Judgment normal.  Nursing note and vitals reviewed.   ED Course  Procedures (including critical care time) DIAGNOSTIC STUDIES: Oxygen Saturation is 96% on RA, normal by my interpretation.    COORDINATION OF CARE: 11:34 PM-labs and x-ray   Labs Review Labs Reviewed  CBC WITH DIFFERENTIAL/PLATELET - Abnormal; Notable for the following:    WBC 14.3 (*)    RBC 4.00 (*)    HCT 38.0 (*)    Neutro Abs 10.7 (*)    Monocytes Absolute 1.2 (*)    All other components within normal limits  COMPREHENSIVE METABOLIC PANEL - Abnormal; Notable for the following:    Sodium 124 (*)    Potassium 5.2 (*)    Chloride 89 (*)    CO2 20 (*)    Glucose, Bld 288 (*)    BUN 97 (*)    Creatinine, Ser 2.43 (*)    Calcium 8.6 (*)    Total Protein 6.1 (*)    Albumin 3.4 (*)    AST 44 (*)    Alkaline Phosphatase 134 (*)    GFR calc non Af Amer 22 (*)    GFR calc Af Amer 26 (*)    All other components within normal limits  LIPASE, BLOOD - Abnormal; Notable for the following:    Lipase 52 (*)    All other components within normal limits  PROTIME-INR - Abnormal; Notable for the following:    Prothrombin Time 44.0 (*)    INR 4.86 (*)    All other components within normal limits  URINALYSIS, ROUTINE W REFLEX MICROSCOPIC (NOT AT [facility]) - Abnormal; Notable for the following:    APPearance CLOUDY (*)    Hgb urine dipstick MODERATE (*)    Leukocytes, UA LARGE (*)    All other components within normal limits  CBC WITH DIFFERENTIAL/PLATELET - Abnormal; Notable for the following:    WBC 15.8 (*)    RBC 3.25 (*)    Hemoglobin 11.2 (*)    HCT 31.3 (*)    MCH 34.5 (*)    Neutro Abs 13.1 (*)    Monocytes Absolute 1.1 (*)    All other components within normal limits  URINE MICROSCOPIC-ADD  ON - Abnormal; Notable for the following:    Squamous Epithelial / LPF 0-5 (*)    Bacteria, UA MANY (*)    All other components within normal limits  I-STAT CG4 LACTIC  ACID, ED - Abnormal; Notable for the following:    Lactic Acid, Venous 4.23 (*)    All other components within normal limits  I-STAT TROPOININ, ED - Abnormal; Notable for the following:    Troponin i, poc 0.13 (*)    All other components within normal limits  I-STAT CHEM 8, ED - Abnormal; Notable for the following:    Sodium 127 (*)    Chloride 94 (*)    BUN 85 (*)    Creatinine, Ser 2.00 (*)    Glucose, Bld 261 (*)    Calcium, Ion 0.96 (*)    Hemoglobin 12.9 (*)    HCT 38.0 (*)    All other components within normal limits  URINE CULTURE  MAGNESIUM  BASIC METABOLIC PANEL  POC OCCULT BLOOD, ED  TYPE AND SCREEN  PREPARE RBC (CROSSMATCH)  PREPARE FRESH FROZEN PLASMA    Imaging Review Dg Chest Port 1 View  12/04/2015  CLINICAL DATA:  Altered mental status.  GI bleed. EXAM: PORTABLE CHEST 1 VIEW COMPARISON:  11/27/2015 FINDINGS: Cardiac pacemaker. Postoperative changes in the mediastinum. Shallow inspiration. Heart size and pulmonary vascularity are normal. No focal airspace disease or consolidation in the lungs. No blunting of costophrenic angles. No pneumothorax. Old left rib fractures. Old left clavicular fracture. IMPRESSION: No active disease. Electronically Signed   By: Lucienne Capers M.D.   On: 12/04/2015 23:57   I have personally reviewed and evaluated these images and lab results as part of my medical decision-making.   EKG Interpretation   Date/Time:  Friday December 04 2015 23:41:34 EDT Ventricular Rate:  70 PR Interval:  142 QRS Duration: 123 QT Interval:  417 QTC Calculation: 450 R Axis:   -155 Text Interpretation:  Ventricular-paced complexes No further analysis  attempted due to paced rhythm new TWI in anterolateral leads Confirmed by  Glynn Octave [phone]) on 12/04/2015 11:51:37 PM      MDM   Final diagnoses:  None    Patient presents to the ED for massive GI bleed for the past 2 hours.  He was initially hypotensive on scene but responded to  fluids.  Type and screen ordered.   2 units of blood ordered as well as vitamin K.  Will hold until labs return.  He is currently stable but with active bleeding.  He was given 80mg  protonix.  Will continue to closely monitor.  EKG shows new STD and TWI in anterior leads.  I spoke with GI who was informed of hypotension and active GI bleed in the room.  He recs for vit K, FFP 2 units to be given.  This had already been ordered.  1:57 AM I spoke with Dr. Detterding in the ICU who will send the fellow to come and evaluate the patient.  The fellow is currently at [facility] so there will be a delay of care. I also spoke with GI on call again who was informed of his continued bleeding, continued hypotension.  He states he will not touch the patient until the INR is reversed.  He recs to add 2 more units of FFP and continue ongoing resuscitation with IVF.    3:51 AM ICU fellow has evaluated the patient for admission.  Patietn has improved after PRBCs given.  Will send  to the ICU for continued care.   CRITICAL CARE Performed by: Everlene Balls   Total critical care time: 46min - hypotensive GI bleed, requiring blood transusion  Critical care time was exclusive of separately billable procedures and treating other patients.  Critical care was necessary to treat or prevent imminent or life-threatening deterioration.  Critical care was time spent personally by me on the following activities: development of treatment plan with patient and/or surrogate as well as nursing, discussions with consultants, evaluation of patient's response to treatment, examination of patient, obtaining history from patient or surrogate, ordering and performing treatments and interventions, ordering and review of laboratory studies, ordering and review of radiographic studies, pulse oximetry and re-evaluation of patient's condition.     I personally performed the services described in this documentation, which was scribed in my  presence. The recorded information has been reviewed and is accurate.       Everlene Balls, MD 12/05/15 [phone]

## 2015-12-04 NOTE — ED Notes (Signed)
Patient to ED from home c/o heavy bleeding from rectum starting at 2130 tonight. Pt has hx CHF (on blood thinners) and hemorrhoids issues in the past. Upon EMS arrival, BP was 80/50, responds to fluid - increased to 117/69 following 531ml NS bolus. Pt A&O x 4.

## 2015-12-04 NOTE — ED Notes (Signed)
Reviewed blood product and administration with patient - verbalized understanding and signed the informed consent form - at bedside.

## 2015-12-05 ENCOUNTER — Inpatient Hospital Stay (HOSPITAL_COMMUNITY): Payer: Medicare Other

## 2015-12-05 ENCOUNTER — Encounter (HOSPITAL_COMMUNITY): Payer: Self-pay

## 2015-12-05 ENCOUNTER — Encounter (HOSPITAL_COMMUNITY)
Admission: EM | Disposition: A | Discharge status: Rehabilitation Facility | Payer: Self-pay | Source: Home / Self Care | Attending: Internal Medicine

## 2015-12-05 DIAGNOSIS — E785 Hyperlipidemia, unspecified: Secondary | ICD-10-CM | POA: Diagnosis present

## 2015-12-05 DIAGNOSIS — N390 Urinary tract infection, site not specified: Secondary | ICD-10-CM | POA: Diagnosis present

## 2015-12-05 DIAGNOSIS — M109 Gout, unspecified: Secondary | ICD-10-CM | POA: Diagnosis present

## 2015-12-05 DIAGNOSIS — Z9221 Personal history of antineoplastic chemotherapy: Secondary | ICD-10-CM | POA: Diagnosis not present

## 2015-12-05 DIAGNOSIS — Z7982 Long term (current) use of aspirin: Secondary | ICD-10-CM | POA: Diagnosis not present

## 2015-12-05 DIAGNOSIS — Z951 Presence of aortocoronary bypass graft: Secondary | ICD-10-CM | POA: Diagnosis not present

## 2015-12-05 DIAGNOSIS — N183 Chronic kidney disease, stage 3 (moderate): Secondary | ICD-10-CM | POA: Diagnosis present

## 2015-12-05 DIAGNOSIS — I5042 Chronic combined systolic (congestive) and diastolic (congestive) heart failure: Secondary | ICD-10-CM | POA: Diagnosis not present

## 2015-12-05 DIAGNOSIS — I480 Paroxysmal atrial fibrillation: Secondary | ICD-10-CM | POA: Diagnosis not present

## 2015-12-05 DIAGNOSIS — M6281 Muscle weakness (generalized): Secondary | ICD-10-CM | POA: Diagnosis present

## 2015-12-05 DIAGNOSIS — I5043 Acute on chronic combined systolic (congestive) and diastolic (congestive) heart failure: Secondary | ICD-10-CM | POA: Diagnosis present

## 2015-12-05 DIAGNOSIS — E875 Hyperkalemia: Secondary | ICD-10-CM | POA: Diagnosis present

## 2015-12-05 DIAGNOSIS — Z950 Presence of cardiac pacemaker: Secondary | ICD-10-CM | POA: Diagnosis not present

## 2015-12-05 DIAGNOSIS — Z681 Body mass index (BMI) 19 or less, adult: Secondary | ICD-10-CM | POA: Diagnosis not present

## 2015-12-05 DIAGNOSIS — K5730 Diverticulosis of large intestine without perforation or abscess without bleeding: Secondary | ICD-10-CM | POA: Diagnosis present

## 2015-12-05 DIAGNOSIS — D696 Thrombocytopenia, unspecified: Secondary | ICD-10-CM | POA: Diagnosis present

## 2015-12-05 DIAGNOSIS — D62 Acute posthemorrhagic anemia: Secondary | ICD-10-CM | POA: Diagnosis present

## 2015-12-05 DIAGNOSIS — R579 Shock, unspecified: Secondary | ICD-10-CM

## 2015-12-05 DIAGNOSIS — R1310 Dysphagia, unspecified: Secondary | ICD-10-CM | POA: Diagnosis not present

## 2015-12-05 DIAGNOSIS — N179 Acute kidney failure, unspecified: Secondary | ICD-10-CM | POA: Diagnosis present

## 2015-12-05 DIAGNOSIS — D6832 Hemorrhagic disorder due to extrinsic circulating anticoagulants: Secondary | ICD-10-CM | POA: Diagnosis present

## 2015-12-05 DIAGNOSIS — I130 Hypertensive heart and chronic kidney disease with heart failure and stage 1 through stage 4 chronic kidney disease, or unspecified chronic kidney disease: Secondary | ICD-10-CM | POA: Diagnosis present

## 2015-12-05 DIAGNOSIS — I999 Unspecified disorder of circulatory system: Secondary | ICD-10-CM | POA: Diagnosis not present

## 2015-12-05 DIAGNOSIS — I482 Chronic atrial fibrillation: Secondary | ICD-10-CM | POA: Diagnosis present

## 2015-12-05 DIAGNOSIS — I252 Old myocardial infarction: Secondary | ICD-10-CM | POA: Diagnosis not present

## 2015-12-05 DIAGNOSIS — G9340 Encephalopathy, unspecified: Secondary | ICD-10-CM | POA: Diagnosis not present

## 2015-12-05 DIAGNOSIS — I509 Heart failure, unspecified: Secondary | ICD-10-CM | POA: Diagnosis not present

## 2015-12-05 DIAGNOSIS — Z7951 Long term (current) use of inhaled steroids: Secondary | ICD-10-CM | POA: Diagnosis not present

## 2015-12-05 DIAGNOSIS — Z794 Long term (current) use of insulin: Secondary | ICD-10-CM | POA: Diagnosis not present

## 2015-12-05 DIAGNOSIS — K922 Gastrointestinal hemorrhage, unspecified: Secondary | ICD-10-CM | POA: Diagnosis present

## 2015-12-05 DIAGNOSIS — R609 Edema, unspecified: Secondary | ICD-10-CM | POA: Diagnosis not present

## 2015-12-05 DIAGNOSIS — Z7901 Long term (current) use of anticoagulants: Secondary | ICD-10-CM | POA: Diagnosis not present

## 2015-12-05 DIAGNOSIS — R5381 Other malaise: Secondary | ICD-10-CM | POA: Diagnosis not present

## 2015-12-05 DIAGNOSIS — I442 Atrioventricular block, complete: Secondary | ICD-10-CM | POA: Diagnosis present

## 2015-12-05 DIAGNOSIS — I82611 Acute embolism and thrombosis of superficial veins of right upper extremity: Secondary | ICD-10-CM | POA: Diagnosis not present

## 2015-12-05 DIAGNOSIS — T45515A Adverse effect of anticoagulants, initial encounter: Secondary | ICD-10-CM | POA: Diagnosis present

## 2015-12-05 DIAGNOSIS — Z880 Allergy status to penicillin: Secondary | ICD-10-CM | POA: Diagnosis not present

## 2015-12-05 DIAGNOSIS — Z87891 Personal history of nicotine dependence: Secondary | ICD-10-CM | POA: Diagnosis not present

## 2015-12-05 DIAGNOSIS — R791 Abnormal coagulation profile: Secondary | ICD-10-CM | POA: Diagnosis not present

## 2015-12-05 DIAGNOSIS — I2510 Atherosclerotic heart disease of native coronary artery without angina pectoris: Secondary | ICD-10-CM | POA: Diagnosis present

## 2015-12-05 DIAGNOSIS — E871 Hypo-osmolality and hyponatremia: Secondary | ICD-10-CM | POA: Diagnosis present

## 2015-12-05 DIAGNOSIS — I255 Ischemic cardiomyopathy: Secondary | ICD-10-CM | POA: Diagnosis present

## 2015-12-05 DIAGNOSIS — K219 Gastro-esophageal reflux disease without esophagitis: Secondary | ICD-10-CM | POA: Diagnosis present

## 2015-12-05 DIAGNOSIS — K5900 Constipation, unspecified: Secondary | ICD-10-CM | POA: Diagnosis present

## 2015-12-05 DIAGNOSIS — I82409 Acute embolism and thrombosis of unspecified deep veins of unspecified lower extremity: Secondary | ICD-10-CM | POA: Diagnosis not present

## 2015-12-05 DIAGNOSIS — E43 Unspecified severe protein-calorie malnutrition: Secondary | ICD-10-CM | POA: Diagnosis present

## 2015-12-05 DIAGNOSIS — E119 Type 2 diabetes mellitus without complications: Secondary | ICD-10-CM | POA: Diagnosis present

## 2015-12-05 DIAGNOSIS — I82A11 Acute embolism and thrombosis of right axillary vein: Secondary | ICD-10-CM | POA: Diagnosis not present

## 2015-12-05 DIAGNOSIS — K626 Ulcer of anus and rectum: Secondary | ICD-10-CM | POA: Diagnosis not present

## 2015-12-05 DIAGNOSIS — I248 Other forms of acute ischemic heart disease: Secondary | ICD-10-CM | POA: Diagnosis present

## 2015-12-05 DIAGNOSIS — I481 Persistent atrial fibrillation: Secondary | ICD-10-CM | POA: Diagnosis not present

## 2015-12-05 DIAGNOSIS — E872 Acidosis: Secondary | ICD-10-CM | POA: Diagnosis not present

## 2015-12-05 DIAGNOSIS — D689 Coagulation defect, unspecified: Secondary | ICD-10-CM | POA: Diagnosis not present

## 2015-12-05 DIAGNOSIS — R578 Other shock: Secondary | ICD-10-CM | POA: Diagnosis present

## 2015-12-05 DIAGNOSIS — B9620 Unspecified Escherichia coli [E. coli] as the cause of diseases classified elsewhere: Secondary | ICD-10-CM | POA: Diagnosis present

## 2015-12-05 DIAGNOSIS — E1122 Type 2 diabetes mellitus with diabetic chronic kidney disease: Secondary | ICD-10-CM | POA: Diagnosis present

## 2015-12-05 DIAGNOSIS — Z9181 History of falling: Secondary | ICD-10-CM | POA: Diagnosis not present

## 2015-12-05 DIAGNOSIS — Z8551 Personal history of malignant neoplasm of bladder: Secondary | ICD-10-CM | POA: Diagnosis not present

## 2015-12-05 HISTORY — PX: FLEXIBLE SIGMOIDOSCOPY: SHX5431

## 2015-12-05 LAB — CBC WITH DIFFERENTIAL/PLATELET
BASOS ABS: 0 10*3/uL (ref 0.0–0.1)
BASOS PCT: 0 %
BASOS PCT: 0 %
Basophils Absolute: 0 10*3/uL (ref 0.0–0.1)
Basophils Absolute: 0.1 10*3/uL (ref 0.0–0.1)
Basophils Relative: 0 %
EOS ABS: 0 10*3/uL (ref 0.0–0.7)
EOS PCT: 0 %
EOS PCT: 1 %
Eosinophils Absolute: 0.2 10*3/uL (ref 0.0–0.7)
Eosinophils Absolute: 0.2 10*3/uL (ref 0.0–0.7)
Eosinophils Relative: 1 %
HCT: 20.7 % — ABNORMAL LOW (ref 39.0–52.0)
HCT: 38 % — ABNORMAL LOW (ref 39.0–52.0)
HEMATOCRIT: 31.3 % — AB (ref 39.0–52.0)
HEMOGLOBIN: 11.2 g/dL — AB (ref 13.0–17.0)
HEMOGLOBIN: 13.1 g/dL (ref 13.0–17.0)
Hemoglobin: 6.9 g/dL — CL (ref 13.0–17.0)
LYMPHS ABS: 1.4 10*3/uL (ref 0.7–4.0)
LYMPHS PCT: 15 %
LYMPHS PCT: 9 %
Lymphocytes Relative: 4 %
Lymphs Abs: 0.5 10*3/uL — ABNORMAL LOW (ref 0.7–4.0)
Lymphs Abs: 2.2 10*3/uL (ref 0.7–4.0)
MCH: 31.1 pg (ref 26.0–34.0)
MCH: 32.8 pg (ref 26.0–34.0)
MCH: 34.5 pg — ABNORMAL HIGH (ref 26.0–34.0)
MCHC: 35.8 g/dL (ref 30.0–36.0)
MCHC: 33.3 g/dL (ref 30.0–36.0)
MCHC: 34.5 g/dL (ref 30.0–36.0)
MCV: 93.2 fL (ref 78.0–100.0)
MCV: 95 fL (ref 78.0–100.0)
MCV: 96.3 fL (ref 78.0–100.0)
MONO ABS: 1.2 10*3/uL — AB (ref 0.1–1.0)
MONOS PCT: 7 %
MONOS PCT: 9 %
Monocytes Absolute: 1.1 10*3/uL — ABNORMAL HIGH (ref 0.1–1.0)
Monocytes Absolute: 1.2 10*3/uL — ABNORMAL HIGH (ref 0.1–1.0)
Monocytes Relative: 9 %
NEUTROS ABS: 11.9 10*3/uL — AB (ref 1.7–7.7)
NEUTROS ABS: 10.7 10*3/uL — AB (ref 1.7–7.7)
NEUTROS PCT: 83 %
Neutro Abs: 13.1 10*3/uL — ABNORMAL HIGH (ref 1.7–7.7)
Neutrophils Relative %: 75 %
Neutrophils Relative %: 87 %
PLATELETS: 81 10*3/uL — AB (ref 150–400)
PLATELETS: 221 10*3/uL (ref 150–400)
Platelets: 198 10*3/uL (ref 150–400)
RBC: 2.22 MIL/uL — ABNORMAL LOW (ref 4.22–5.81)
RBC: 3.25 MIL/uL — AB (ref 4.22–5.81)
RBC: 4 MIL/uL — ABNORMAL LOW (ref 4.22–5.81)
RDW: 14.5 % (ref 11.5–15.5)
RDW: 14.5 % (ref 11.5–15.5)
RDW: 14.2 % (ref 11.5–15.5)
SMEAR REVIEW: ADEQUATE
WBC: 13.6 10*3/uL — ABNORMAL HIGH (ref 4.0–10.5)
WBC: 14.3 10*3/uL — ABNORMAL HIGH (ref 4.0–10.5)
WBC: 15.8 10*3/uL — AB (ref 4.0–10.5)

## 2015-12-05 LAB — CBC
HCT: 22.8 % — ABNORMAL LOW (ref 39.0–52.0)
HCT: 28 % — ABNORMAL LOW (ref 39.0–52.0)
HEMATOCRIT: 24.2 % — AB (ref 39.0–52.0)
HEMATOCRIT: 25.3 % — AB (ref 39.0–52.0)
HEMOGLOBIN: 7.9 g/dL — AB (ref 13.0–17.0)
HEMOGLOBIN: 8.6 g/dL — AB (ref 13.0–17.0)
Hemoglobin: 8.4 g/dL — ABNORMAL LOW (ref 13.0–17.0)
Hemoglobin: 9.4 g/dL — ABNORMAL LOW (ref 13.0–17.0)
MCH: 31.9 pg (ref 26.0–34.0)
MCH: 30.3 pg (ref 26.0–34.0)
MCH: 29.6 pg (ref 26.0–34.0)
MCH: 29.6 pg (ref 26.0–34.0)
MCHC: 34.7 g/dL (ref 30.0–36.0)
MCHC: 34.6 g/dL (ref 30.0–36.0)
MCHC: 33.6 g/dL (ref 30.0–36.0)
MCHC: 34 g/dL (ref 30.0–36.0)
MCV: 92 fL (ref 78.0–100.0)
MCV: 87.4 fL (ref 78.0–100.0)
MCV: 88.1 fL (ref 78.0–100.0)
MCV: 86.9 fL (ref 78.0–100.0)
PLATELETS: 69 10*3/uL — AB (ref 150–400)
Platelets: 132 10*3/uL — ABNORMAL LOW (ref 150–400)
Platelets: 56 10*3/uL — ABNORMAL LOW (ref 150–400)
Platelets: 116 10*3/uL — ABNORMAL LOW (ref 150–400)
RBC: 2.63 MIL/uL — ABNORMAL LOW (ref 4.22–5.81)
RBC: 2.61 MIL/uL — ABNORMAL LOW (ref 4.22–5.81)
RBC: 3.18 MIL/uL — AB (ref 4.22–5.81)
RBC: 2.91 MIL/uL — AB (ref 4.22–5.81)
RDW: 15 % (ref 11.5–15.5)
RDW: 15 % (ref 11.5–15.5)
RDW: 14.6 % (ref 11.5–15.5)
RDW: 14.6 % (ref 11.5–15.5)
WBC: 18.7 10*3/uL — AB (ref 4.0–10.5)
WBC: 19.1 10*3/uL — ABNORMAL HIGH (ref 4.0–10.5)
WBC: 18.6 10*3/uL — AB (ref 4.0–10.5)
WBC: 26.3 10*3/uL — AB (ref 4.0–10.5)

## 2015-12-05 LAB — I-STAT CHEM 8, ED
BUN: 85 mg/dL — AB (ref 6–20)
CHLORIDE: 94 mmol/L — AB (ref 101–111)
CREATININE: 2 mg/dL — AB (ref 0.61–1.24)
Calcium, Ion: 0.96 mmol/L — ABNORMAL LOW (ref 1.13–1.30)
Glucose, Bld: 261 mg/dL — ABNORMAL HIGH (ref 65–99)
HEMATOCRIT: 38 % — AB (ref 39.0–52.0)
Hemoglobin: 12.9 g/dL — ABNORMAL LOW (ref 13.0–17.0)
POTASSIUM: 5 mmol/L (ref 3.5–5.1)
SODIUM: 127 mmol/L — AB (ref 135–145)
TCO2: 19 mmol/L (ref 0–100)

## 2015-12-05 LAB — BASIC METABOLIC PANEL
Anion gap: 12 (ref 5–15)
Anion gap: 14 (ref 5–15)
Anion gap: 15 (ref 5–15)
BUN: 88 mg/dL — AB (ref 6–20)
BUN: 83 mg/dL — AB (ref 6–20)
BUN: 78 mg/dL — AB (ref 6–20)
CALCIUM: 7.2 mg/dL — AB (ref 8.9–10.3)
CALCIUM: 7.1 mg/dL — AB (ref 8.9–10.3)
CO2: 17 mmol/L — ABNORMAL LOW (ref 22–32)
CO2: 15 mmol/L — ABNORMAL LOW (ref 22–32)
CO2: 12 mmol/L — AB (ref 22–32)
CREATININE: 2.07 mg/dL — AB (ref 0.61–1.24)
CREATININE: 2.11 mg/dL — AB (ref 0.61–1.24)
CREATININE: 1.89 mg/dL — AB (ref 0.61–1.24)
Calcium: 6.8 mg/dL — ABNORMAL LOW (ref 8.9–10.3)
Chloride: 100 mmol/L — ABNORMAL LOW (ref 101–111)
Chloride: 107 mmol/L (ref 101–111)
Chloride: 109 mmol/L (ref 101–111)
GFR calc Af Amer: 31 mL/min — ABNORMAL LOW (ref >60)
GFR calc Af Amer: 35 mL/min — ABNORMAL LOW (ref >60)
GFR calc non Af Amer: 30 mL/min — ABNORMAL LOW (ref >60)
GFR, EST AFRICAN AMERICAN: 31 mL/min — AB (ref >60)
GFR, EST NON AFRICAN AMERICAN: 27 mL/min — AB (ref >60)
GFR, EST NON AFRICAN AMERICAN: 26 mL/min — AB (ref >60)
GLUCOSE: 203 mg/dL — AB (ref 65–99)
GLUCOSE: 112 mg/dL — AB (ref 65–99)
Glucose, Bld: 269 mg/dL — ABNORMAL HIGH (ref 65–99)
Potassium: 5.5 mmol/L — ABNORMAL HIGH (ref 3.5–5.1)
Potassium: 4.7 mmol/L (ref 3.5–5.1)
Potassium: 4.6 mmol/L (ref 3.5–5.1)
SODIUM: 129 mmol/L — AB (ref 135–145)
SODIUM: 136 mmol/L (ref 135–145)
Sodium: 136 mmol/L (ref 135–145)

## 2015-12-05 LAB — OCCULT BLOOD, POC DEVICE: Fecal Occult Bld: POSITIVE — AB

## 2015-12-05 LAB — I-STAT TROPONIN, ED: TROPONIN I, POC: 0.13 ng/mL — AB (ref 0.00–0.08)

## 2015-12-05 LAB — URINALYSIS, ROUTINE W REFLEX MICROSCOPIC
Bilirubin Urine: NEGATIVE
GLUCOSE, UA: NEGATIVE mg/dL
Ketones, ur: NEGATIVE mg/dL
Nitrite: NEGATIVE
PH: 6 (ref 5.0–8.0)
PROTEIN: NEGATIVE mg/dL
SPECIFIC GRAVITY, URINE: 1.012 (ref 1.005–1.030)

## 2015-12-05 LAB — URINE MICROSCOPIC-ADD ON

## 2015-12-05 LAB — MAGNESIUM
Magnesium: 1.7 mg/dL (ref 1.7–2.4)
Magnesium: 1.8 mg/dL (ref 1.7–2.4)

## 2015-12-05 LAB — COMPREHENSIVE METABOLIC PANEL
ALT: 45 U/L (ref 17–63)
ANION GAP: 15 (ref 5–15)
AST: 44 U/L — ABNORMAL HIGH (ref 15–41)
Albumin: 3.4 g/dL — ABNORMAL LOW (ref 3.5–5.0)
Alkaline Phosphatase: 134 U/L — ABNORMAL HIGH (ref 38–126)
BUN: 97 mg/dL — ABNORMAL HIGH (ref 6–20)
CHLORIDE: 89 mmol/L — AB (ref 101–111)
CO2: 20 mmol/L — AB (ref 22–32)
Calcium: 8.6 mg/dL — ABNORMAL LOW (ref 8.9–10.3)
Creatinine, Ser: 2.43 mg/dL — ABNORMAL HIGH (ref 0.61–1.24)
GFR calc Af Amer: 26 mL/min — ABNORMAL LOW (ref >60)
GFR calc non Af Amer: 22 mL/min — ABNORMAL LOW (ref >60)
GLUCOSE: 288 mg/dL — AB (ref 65–99)
POTASSIUM: 5.2 mmol/L — AB (ref 3.5–5.1)
SODIUM: 124 mmol/L — AB (ref 135–145)
Total Bilirubin: 1.1 mg/dL (ref 0.3–1.2)
Total Protein: 6.1 g/dL — ABNORMAL LOW (ref 6.5–8.1)

## 2015-12-05 LAB — PREPARE RBC (CROSSMATCH)

## 2015-12-05 LAB — PROTIME-INR
INR: 2.04 — ABNORMAL HIGH (ref 0.00–1.49)
INR: 2.31 — AB (ref 0.00–1.49)
INR: 4.86 — AB (ref 0.00–1.49)
PROTHROMBIN TIME: 22.9 s — AB (ref 11.6–15.2)
PROTHROMBIN TIME: 25.2 s — AB (ref 11.6–15.2)
Prothrombin Time: 44 seconds — ABNORMAL HIGH (ref 11.6–15.2)

## 2015-12-05 LAB — TROPONIN I
TROPONIN I: 0.1 ng/mL — AB (ref <0.031)
TROPONIN I: 0.13 ng/mL — AB (ref <0.031)
TROPONIN I: 0.2 ng/mL — AB (ref <0.031)

## 2015-12-05 LAB — GLUCOSE, CAPILLARY
GLUCOSE-CAPILLARY: 309 mg/dL — AB (ref 65–99)
GLUCOSE-CAPILLARY: 282 mg/dL — AB (ref 65–99)
GLUCOSE-CAPILLARY: 118 mg/dL — AB (ref 65–99)
GLUCOSE-CAPILLARY: 142 mg/dL — AB (ref 65–99)
GLUCOSE-CAPILLARY: 150 mg/dL — AB (ref 65–99)
Glucose-Capillary: 257 mg/dL — ABNORMAL HIGH (ref 65–99)
Glucose-Capillary: 255 mg/dL — ABNORMAL HIGH (ref 65–99)
Glucose-Capillary: 197 mg/dL — ABNORMAL HIGH (ref 65–99)
Glucose-Capillary: 192 mg/dL — ABNORMAL HIGH (ref 65–99)
Glucose-Capillary: 162 mg/dL — ABNORMAL HIGH (ref 65–99)
Glucose-Capillary: 98 mg/dL (ref 65–99)
Glucose-Capillary: 85 mg/dL (ref 65–99)

## 2015-12-05 LAB — MRSA PCR SCREENING: MRSA by PCR: NEGATIVE

## 2015-12-05 LAB — LIPASE, BLOOD: Lipase: 52 U/L — ABNORMAL HIGH (ref 11–51)

## 2015-12-05 LAB — I-STAT CG4 LACTIC ACID, ED: Lactic Acid, Venous: 4.23 mmol/L (ref 0.5–2.0)

## 2015-12-05 LAB — PHOSPHORUS: PHOSPHORUS: 3.8 mg/dL (ref 2.5–4.6)

## 2015-12-05 LAB — LACTIC ACID, PLASMA
LACTIC ACID, VENOUS: 2 mmol/L (ref 0.5–2.0)
Lactic Acid, Venous: 5 mmol/L (ref 0.5–2.0)
Lactic Acid, Venous: 6.9 mmol/L (ref 0.5–2.0)

## 2015-12-05 SURGERY — SIGMOIDOSCOPY, FLEXIBLE
Anesthesia: Moderate Sedation

## 2015-12-05 MED ORDER — INSULIN GLARGINE 100 UNIT/ML ~~LOC~~ SOLN
20.0000 [IU] | SUBCUTANEOUS | Status: DC
  Administered 2015-12-05 – 2015-12-06 (×2): 20 [IU] via SUBCUTANEOUS
  Filled 2015-12-05 (×3): qty 0.2

## 2015-12-05 MED ORDER — SODIUM CHLORIDE 0.9 % IV SOLN
Freq: Once | INTRAVENOUS | Status: AC
  Administered 2015-12-05: 06:00:00 via INTRAVENOUS

## 2015-12-05 MED ORDER — IPRATROPIUM BROMIDE 0.06 % NA SOLN: 1.0000 | Freq: Three times a day (TID) | NASAL | Status: DC | PRN

## 2015-12-05 MED ORDER — SODIUM CHLORIDE 0.9 % IV SOLN
INTRAVENOUS | Status: DC
  Administered 2015-12-05: 2.2 [IU]/h via INTRAVENOUS
  Filled 2015-12-05: qty 2.5

## 2015-12-05 MED ORDER — ONDANSETRON HCL 4 MG/2ML IJ SOLN
4.0000 mg | Freq: Four times a day (QID) | INTRAMUSCULAR | Status: DC | PRN
  Administered 2015-12-05: 4 mg via INTRAVENOUS
  Filled 2015-12-05: qty 2

## 2015-12-05 MED ORDER — SODIUM CHLORIDE 0.9 % IV SOLN: Freq: Once | INTRAVENOUS | Status: AC

## 2015-12-05 MED ORDER — VITAMIN K1 10 MG/ML IJ SOLN
5.0000 mg | Freq: Once | INTRAVENOUS | Status: DC
  Filled 2015-12-05 (×2): qty 0.5

## 2015-12-05 MED ORDER — EPINEPHRINE HCL 1 MG/ML IJ SOLN
INTRAMUSCULAR | Status: DC | PRN
Start: 2015-12-05 — End: 2015-12-05
  Administered 2015-12-05: 4 mg via SUBCUTANEOUS

## 2015-12-05 MED ORDER — SODIUM CHLORIDE 0.9 % IV SOLN: INTRAVENOUS | Status: DC

## 2015-12-05 MED ORDER — PHENYLEPHRINE HCL 10 MG/ML IJ SOLN
0.0000 ug/min | INTRAVENOUS | Status: DC
  Administered 2015-12-05: 175 ug/min via INTRAVENOUS
  Administered 2015-12-05: 100 ug/min via INTRAVENOUS
  Administered 2015-12-06 (×2): 150 ug/min via INTRAVENOUS
  Filled 2015-12-05 (×6): qty 4

## 2015-12-05 MED ORDER — SODIUM CHLORIDE 0.9 % IV SOLN: 250.0000 mL | INTRAVENOUS | Status: DC | PRN

## 2015-12-05 MED ORDER — TECHNETIUM TC 99M-LABELED RED BLOOD CELLS IV KIT
25.2000 | PACK | Freq: Once | INTRAVENOUS | Status: AC | PRN
  Administered 2015-12-05: 25.2 via INTRAVENOUS

## 2015-12-05 MED ORDER — SODIUM CHLORIDE 0.9 % IV BOLUS (SEPSIS)
2000.0000 mL | Freq: Once | INTRAVENOUS | Status: AC
  Administered 2015-12-05: 2000 mL via INTRAVENOUS

## 2015-12-05 MED ORDER — SODIUM CHLORIDE 0.9 % IV SOLN: 10.0000 mL/h | Freq: Once | INTRAVENOUS | Status: DC

## 2015-12-05 MED ORDER — SODIUM CHLORIDE 0.9 % IV SOLN: Freq: Once | INTRAVENOUS | Status: DC

## 2015-12-05 MED ORDER — MIDAZOLAM HCL 5 MG/ML IJ SOLN
INTRAMUSCULAR | Status: AC
  Filled 2015-12-05: qty 2

## 2015-12-05 MED ORDER — FENTANYL CITRATE (PF) 100 MCG/2ML IJ SOLN
INTRAMUSCULAR | Status: AC
  Filled 2015-12-05: qty 2

## 2015-12-05 MED ORDER — ACETAMINOPHEN 325 MG PO TABS
650.0000 mg | ORAL_TABLET | ORAL | Status: DC | PRN
  Administered 2015-12-06 – 2015-12-08 (×5): 650 mg via ORAL
  Filled 2015-12-05 (×6): qty 2

## 2015-12-05 MED ORDER — PHENYLEPHRINE HCL 10 MG/ML IJ SOLN
0.0000 ug/min | INTRAVENOUS | Status: DC
  Administered 2015-12-05 (×2): 100 ug/min via INTRAVENOUS
  Filled 2015-12-05 (×4): qty 1

## 2015-12-05 MED ORDER — INSULIN ASPART 100 UNIT/ML ~~LOC~~ SOLN
1.0000 [IU] | SUBCUTANEOUS | Status: DC
  Administered 2015-12-05: 1 [IU] via SUBCUTANEOUS
  Administered 2015-12-06: 2 [IU] via SUBCUTANEOUS
  Administered 2015-12-06: 1 [IU] via SUBCUTANEOUS
  Administered 2015-12-06: 3 [IU] via SUBCUTANEOUS
  Administered 2015-12-06: 1 [IU] via SUBCUTANEOUS
  Administered 2015-12-06: 3 [IU] via SUBCUTANEOUS

## 2015-12-05 MED ORDER — PANTOPRAZOLE SODIUM 40 MG IV SOLR
8.0000 mg/h | INTRAVENOUS | Status: DC
  Administered 2015-12-05: 8 mg/h via INTRAVENOUS
  Filled 2015-12-05 (×2): qty 80

## 2015-12-05 MED ORDER — EPINEPHRINE HCL 0.1 MG/ML IJ SOSY
PREFILLED_SYRINGE | INTRAMUSCULAR | Status: AC
  Filled 2015-12-05: qty 10

## 2015-12-05 MED ORDER — INSULIN ASPART 100 UNIT/ML ~~LOC~~ SOLN: 2.0000 [IU] | SUBCUTANEOUS | Status: DC

## 2015-12-05 MED ORDER — SODIUM CHLORIDE 0.9 % IV SOLN
INTRAVENOUS | Status: DC
  Administered 2015-12-05: 100 mL/h via INTRAVENOUS

## 2015-12-05 NOTE — Progress Notes (Signed)
I was updated by Dr Carlean Purl and my APP Tammy in past hour  S/p cautery of rectal ulcer and bleeding has stopped  APP spoke at time of scope to wife - wife adamant patient full code though patient exprssed to RN/aide that he thinks he will die tonight  App then eye balled patient and patient looked stable  PLAN  - RN to wean off pressors - if needed then will place cvl - rest per RN and ICU and eICU protocolo - CVL if needed  Dr. Brand Males, M.D., [facility].C.P Pulmonary and Critical Care Medicine Staff Physician [place] [facility] Pager: [phone], If no answer or between  15:00h - 7:00h: call [phone]  12/05/2015 6:21 PM

## 2015-12-05 NOTE — ED Notes (Signed)
Requested emergency release blood

## 2015-12-05 NOTE — Brief Op Note (Signed)
12/04/2015 - 12/05/2015  5:21 PM  PATIENT:  [name]  82 y.o. male  PRE-OPERATIVE DIAGNOSIS:  Lower GI bleed  POST-OPERATIVE DIAGNOSIS:Bleeding anorectal ulcer  PROCEDURE:  Procedure(s): FLEXIBLE SIGMOIDOSCOPY (N/A)  SURGEON:  Surgeon(s) and Role:    * [name], MD - Prima  ANESTHESIA:   none  1) Small ulcer with bleeding artery at very distal rectum/anal verge. Treated with EPI 1:10K x 4 cc and multiple applications of 7 Fr  Bicap - no bleeding at end of procedure  2) ? Proctitis - mild  3) Sigmoid diverticulosis   Plan: Observe and support Transfuse PLTS No fecal collection devices and avoid manipulating rectum  Full report to follow  [name], MD, [facility] [phone] (pager) [phone] after 5 PM, weekends and holidays  12/05/2015 5:24 PM

## 2015-12-05 NOTE — Op Note (Signed)
[facility] Patient Name: [name] Procedure Date : 12/05/2015 MRN: [id number] Attending MD: [name] , MD Date of Birth: 19-Jul-1925 CSN: [id number] Age: 82 Admit Type: Inpatient Procedure:                Flexible Sigmoidoscopy Indications:              Rectal hemorrhage, Hematochezia Providers:                [name], MD, Hilma Favors, RN, Alfonso Patten,                            Technician Referring MD:              Medicines:                None Complications:            No immediate complications. Estimated Blood Loss:     Estimated blood loss was minimal. Procedure:                Pre-Anesthesia Assessment:                           - Prior to the procedure, a History and Physical                            was performed, and patient medications and                            allergies were reviewed. The patient's tolerance of                            previous anesthesia was also reviewed. The risks                            and benefits of the procedure and the sedation                            options and risks were discussed with the patient.                            All questions were answered, and informed consent                            was obtained. Prior Anticoagulants: The patient                            last took Coumadin (warfarin) 1 day prior to the                            procedure. ASA Grade Assessment: IV - A patient                            with severe systemic disease that is a constant  threat to life. After reviewing the risks and                            benefits, the patient was deemed in satisfactory                            condition to undergo the procedure.                           After obtaining informed consent, the scope was                            passed under direct vision. The [id number])                            scope was introduced through the anus and  advanced                            to the the descending colon. The flexible                            sigmoidoscopy was somewhat difficult due to the                            patient's discomfort during the procedure. The                            quality of the bowel preparation was adequate. Scope In: Scope Out: Findings:      The perianal and digital rectal examinations were normal.      A single (solitary) five mm ulcer was found in the rectum and in the       distal rectum. Spurting bleeding was present. Area was unsuccessfully       injected with 4 mL of a 1:10,000 solution of epinephrine for hemostasis.       Estimated blood loss was minimal. Coagulation for hemostasis using       bipolar probe was successful. Estimated blood loss was minimal.      Multiple small-mouthed diverticula were found in the sigmoid colon.       There was no evidence of diverticular bleeding. Impression:               - A single (solitary) ulcer in the rectum and in                            the distal rectum. Treatment not successful.                            Treated with bipolar cautery.                           - Diverticulosis in the sigmoid colon. There was no                            evidence of diverticular bleeding.                           -  No specimens collected. Moderate Sedation:      None Recommendation:           - Return patient to ICU for ongoing care.                           - Clear liquid diet.                           - Continue present medications.                           - Transfuse PLTS                           Avoid rectal manipulation                           Stay off anticoagulants - maybe forever Procedure Code(s):        --- Professional ---                           [phone], Sigmoidoscopy, flexible; with control of                            bleeding, any method Diagnosis Code(s):        --- Professional ---                           K62.6, Ulcer of anus  and rectum                           K62.5, Hemorrhage of anus and rectum                           K92.1, Melena (includes Hematochezia)                           K57.30, Diverticulosis of large intestine without                            perforation or abscess without bleeding CPT copyright 2016 [employer]. All rights reserved. The codes documented in this report are preliminary and upon coder review may  be revised to meet current compliance requirements. [name], MD 12/05/2015 5:36:04 PM This report has been signed electronically. Number of Addenda: 0

## 2015-12-05 NOTE — Progress Notes (Signed)
PICC discussed with pt and wife.  Consent obtained.  Dr. Carlean Purl also in room with plan to take pt for OR soon. SBP currently 65.  Will reassess later.

## 2015-12-05 NOTE — ED Notes (Signed)
Intensivist at bedside.

## 2015-12-05 NOTE — Progress Notes (Signed)
PULMONARY / CRITICAL CARE MEDICINE   Name: [name] MRN: [id number] DOB: [birth year]    ADMISSION DATE:  12/04/2015 CONSULTATION DATE:  12/04/15  REFERRING MD:  ED physician  CHIEF COMPLAINT:  GI bleed  Brief Note : 82yo male admitted 12/04/15 with acute GIB . On coumadin with INR at 4.86 on admit.   HISTORY OF PRESENT ILLNESS:   [name] is a 82 y.o. man with past medical history of atrial fibrillation (on Coumadin), coronary artery disease s/p CABG, HFrEF, GERD, hemorrhoids, DM type 2, bladder cancer, GERD who presents to the emergency department with complaint of bright red blood per rectum.  Patient reports symptoms began last night (4/21) around 7pm when he noticed that he was bleeding all over the floor.  He has prior history of hemorrhoids but no history of GI bleed to this extent.  He describes the blood as bright red.  He reports a strange sensation in his rectum with bleeding but otherwise does not have any symptoms or pain.  Patient has recent history of a fall earlier in the month requiring an ED visit.  He reports some constipation about 2 weeks ago and difficulty urinating that has resolved.  He denies symptoms of feeling lightheaded, SOB, chest pain, nausea, vomiting, abdominal pain, diarrhea, fever or chills, weight loss.  He does report a decreased appetite.  He has been on Coumadin for at least "a couple years".  Patient had colonoscopy about 10 years unremarkable except for a hiatal hernia.  He denies EtOH use.  Reports he quit smoking in 1961.    In the ED, patient still with active bleeding.  He is receiving 3L normal saline, vitamin K, FFP x 2 units, and 1 unit of PRBC.  His hemoglobin is 11.2, PT/INR 44/4.86.  EKG shows new ST changes and T-wave inversions in the anterior leads.   Subjective/Overnight :  Pt continues to have active bleeding. Has received 6 u PRBC , 3/4 FFP and Vitamin K .  B/p trending down early this am.  Pt is alert sitting up in bed. No chest  pain. +nausea    VITAL SIGNS: BP 129/70 mmHg  Pulse 89  Temp(Src) 97.6 F (36.4 C) (Oral)  Resp 19  Ht 5\' 8"  (1.727 m)  Wt 106 lb 4.2 oz (48.2 kg)  BMI 16.16 kg/m2  SpO2 100%  HEMODYNAMICS:    VENTILATOR SETTINGS:    INTAKE / OUTPUT: I/O last 3 completed shifts: In: 3435 [I.V.:3100; Blood:335] Out: 200 [Urine:200]  PHYSICAL EXAMINATION: General:  Elderly appearing, thin, caucasian male, calm  Neuro:  Alert and oriented, normal mentation, no focal deficits HEENT:  Left facial and periorbital ecchymosis, dry mucus membranes Cardiovascular:  RRR, normal s1, s2, intact distal pulses, warm extremities.  AICD pacemaker Right chest wall Lungs:  No increased work of breathing, CTAB Abdomen:  Soft, non-tender, non-distended, BS +  +bright red blood coming from rectum  Musculoskeletal:  No deformities Skin:  Pale, intact, no rashes  LABS:  BMET  Recent Labs Lab 12/04/15 2350 12/05/15 0008 12/05/15 0345  NA 124* 127* 129*  K 5.2* 5.0 5.5*  CL 89* 94* 100*  CO2 20*  --  17*  BUN 97* 85* 88*  CREATININE 2.43* 2.00* 2.07*  GLUCOSE 288* 261* 269*    Electrolytes  Recent Labs Lab 12/04/15 2350 12/05/15 0345  CALCIUM 8.6* 7.2*  MG 1.8  --     CBC  Recent Labs Lab 12/04/15 2350 12/05/15 0008 12/05/15 0055  12/05/15 0634  WBC 14.3*  --  15.8* 18.7*  HGB 13.1 12.9* 11.2* 8.4*  HCT 38.0* 38.0* 31.3* 24.2*  PLT 221  --  198 132*    Coag's  Recent Labs Lab 12/04/15 2350  INR 4.86*    Sepsis Markers  Recent Labs Lab 12/05/15 0009 12/05/15 0516  LATICACIDVEN 4.23* 2.0    ABG No results for input(s): PHART, PCO2ART, PO2ART in the last 168 hours.  Liver Enzymes  Recent Labs Lab 12/04/15 2350  AST 44*  ALT 45  ALKPHOS 134*  BILITOT 1.1  ALBUMIN 3.4*    Cardiac Enzymes No results for input(s): TROPONINI, PROBNP in the last 168 hours.  Glucose  Recent Labs Lab 12/05/15 0540  GLUCAP 309*    Imaging Dg Chest Port 1  View  12/04/2015  CLINICAL DATA:  Altered mental status.  GI bleed. EXAM: PORTABLE CHEST 1 VIEW COMPARISON:  11/27/2015 FINDINGS: Cardiac pacemaker. Postoperative changes in the mediastinum. Shallow inspiration. Heart size and pulmonary vascularity are normal. No focal airspace disease or consolidation in the lungs. No blunting of costophrenic angles. No pneumothorax. Old left rib fractures. Old left clavicular fracture. IMPRESSION: No active disease. Electronically Signed   By: Lucienne Capers M.D.   On: 12/04/2015 23:57     STUDIES:  4/22 CXR > no acute process  CULTURES: n/a  ANTIBIOTICS: n/a  SIGNIFICANT EVENTS: 4/22 admit to ICU for GI bleed while on coumadin and persistent bleeding, hypotension  LINES/TUBES: Foley catheter 4/22 PIV x 3  DISCUSSION: 82 y.o. man with past medical history of atrial fibrillation (on Coumadin), coronary artery disease s/p CABG, HFrEF, GERD, hemorrhoids, DM type 2, bladder cancer, GERD who presents to the emergency department with complaint of bright red blood per rectum.  Received 6  unit PRBC, 3/4  units FFP, vitamin K,  And IVF   ASSESSMENT / PLAN:  PULMONARY A: Hx of SOB with CHF P:   - Atrovent TID prn  CARDIOVASCULAR A:  EKG changes Shock likely hypovolemic/hemorrhagic 2/2 lower GI bleed Troponin leak - likely demand Pacemaker - currently V-paced in 60s occasionally 15s Hx atrial fibrillation Hx CAD Hx HTN Hx of HFrEF 4/22>lactate tr down  P:  - repeat EKG - cycle troponins - trend lactic acid - asked cardiology to turn up his pacer to 90, device rep to come do so - telemetry - hold BP meds, lasix and aldactone  -dig on hold for now - - see GI section -IVF bolus 500cc x 1 , then cont IVF at 100cc   RENAL A:    Hyponatremia Hyperkalemia  Acute on chronic renal failure  P:   - NS IV fluids - check  BMET this am  - I/O's, daily weight   GASTROINTESTINAL A:   Lower GI Bleed likely 2/2 AVM vs  diverticular 4/22>s/p 6 u PRBC , 3/4 FFP  P:   -  PPI gtt stopped early am  - 2 Large borre IV - CBC Q4H - IVF resuscitation with blood products as needed  - GI consult-[facility] aware, spoke Dr. Carlean Hodges this am ~0840 , aware and to see pt this am waiting for INR to stablize.  - NPO  HEMATOLOGIC A:   Lower GI Bleed  Coagulopathy 4/22 >hbg 11.2 >8.4  INR 4.8 P:  - see GI section - Stat INR now  -add zofran As needed    INFECTIOUS A:   No acute issues P:   Monitor temp and wbc   ENDOCRINE A:  DM type 2    P:   - Insulin drip   NEUROLOGIC A:   No acute issues P:   RASS goal: 0   FAMILY  - Updates: per patient, he lives with his wife who is currently out of town at [employer].  - Inter-disciplinary family meet or Palliative Care meeting due by:  day 7  Jireh Elmore NP-C  Pulmonary and [place] Pager: [phone]  12/05/2015, 8:38 AM

## 2015-12-05 NOTE — Consult Note (Addendum)
IMPRESSION:  -Massive GI bleed:  Suspect lower/diverticular source in the setting of coumadin coagulopathy.   -Acute blood loss anemia:  Hgb 8.4 grams down from 16.3 grams just one week ago.  Already received 4 units PRBC's. -Coumadin coagulopathy:  INR 4.86 on admission.  Receiving 4th unit FFP currently and received Vitamin K in the ED.  PLAN: -STAT nuclear medicine bleeding scan.  If positive ? IR for angiography. -Will order 2 more units FFP as well.  ZEHR, JESSICA D.  12/05/2015, 8:40 AM  Pager number [phone]     [facility] Attending   I have taken an interval history, reviewed the chart and examined the patient. I agree with the Advanced Practitioner's note, impression and recommendations.   Massive painless GI bleed - hematochezia never melena in setting of supratherapeutic INR and anti-coagulation Tx Seems like a diverticular bleed - it is also possible he could have a stercoral ulcer - recent fecal impaction by prior CT.  Nuc Med bleeding scan soon Pressors Blood  FFP Possible endoscopic evaluation and Tx depending upon what we find out Needs to have pacer rate elevated Will follow-up later today He is critically ill and in shock from hemorrhage now.   [name], MD, [facility] Gastroenterology [phone] (pager) [phone] after 5 PM, weekends and holidays  12/05/2015 10:39 AM   Referring Provider: [facility] Primary Care Physician:  Donnajean Lopes, MD Primary Gastroenterologist:  Althia Forts  Reason for Consultation:  GI bleed  HPI: [name] is a 82 y.o. male with past medical history of atrial fibrillation (on Coumadin) with pacemaker, coronary artery disease s/p CABG, CVD with CEA, GERD, hemorrhoids, DM type 2, bladder cancer, and CHF who presents to the emergency department with complaint of bright red blood per rectum. Patient reports symptoms began last night (4/21) around 7pm when he noticed that he was bleeding all over the floor. He has  prior history of hemorrhoids but no history of GI bleed to this extent. He describes the blood as red. He reports a strange sensation in his rectum with bleeding but otherwise does not have any symptoms or pain. Patient has recent history of a fall earlier in the month requiring an ED visit (has bruising on face). He reports some constipation about 2 weeks ago and difficulty urinating with that, but that issue resolved.  He has been on Coumadin for at least "a couple years". Patient had colonoscopy about 10 years.  He denies EtOH use. Reports he quit smoking in 1961.   Hgb down to 8.4 grams this AM from 16.3 grams 8 days ago.  INR was 4.86 on admission.  He has received 4 units PRBC's and is receiving 4th unit of FFP currently.  Also received Vitamin K in the ED.  Is stool oozing blood with clots continuously from his rectum.  BP running very low at times.   Past Medical History  Diagnosis Date  . Atrial fibrillation (HCC)     Chronic  . Coronary artery disease     Status post CABG. Hx of anterior apical infarct.  . Renal insufficiency     Chronic  . Hypertension   . Hyperlipidemia   . Gout   . CHF (congestive heart failure) (HCC)     with chronic ischemic cardiomypathy. EF of 10-15%.  CHF due to systolic dysfunction. Clinically doing well.  [name] GERD (gastroesophageal reflux disease)   . Pacemaker   . Shortness of breath     "only w/CHF" (03/20/2013)  .  Type II diabetes mellitus ([place])     "been off my RX for ~ 2 yr" (03/20/2013)  . H/O hiatal hernia   . Arthritis     "right thumb" (03/20/2013)  . Depression   . Anxiety   . Bladder cancer ([place])     "low grade; grade 1; non-invasive" (03/20/2013)    Past Surgical History  Procedure Laterality Date  . Transurethral resection of prostate  1990's; 2004    "twice; injected chemo 1st time; didn't do that 2nd time" (03/20/2013)  . Nasal septum surgery    . US echocardiography  04-17-2009    Est EF 10-15%  . Cardiovascular stress test   04-17-2009    EF 29%  . Coronary artery bypass graft  02/19/2003    CABG "X2" (03/20/2013)  . Carotid endarterectomy Right 1990's    "left side is still  100% blocked" (03/20/2013)  . Icd lead removal Left 03/01/2013    Procedure: ICD LEAD REMOVAL;  Surgeon: Evans Lance, MD;  Location: [facility];  Service: Cardiovascular;  Laterality: Left;  . Pacemaker lead removal Left 03/01/2013    Procedure: PACEMAKER LEAD REMOVAL;  Surgeon: Evans Lance, MD;  Location: [place];  Service: Cardiovascular;  Laterality: Left;  . Pacemaker insertion N/A 03/01/2013    Procedure: INSERTION PACEMAKER LEAD;  Surgeon: Evans Lance, MD;  Location: [place];  Service: Cardiovascular;  Laterality: N/A;  . Bi-ventricular pacemaker insertion (crt-p) Right 03/20/2013  . Hemorrhoid surgery      "lanced several years ago" (03/20/2013)  . Tonsillectomy and adenoidectomy  1933  . Cataract extraction w/ intraocular lens  implant, bilateral Bilateral ~ 2000  . Bi-ventricular pacemaker insertion N/A 03/20/2013    Procedure: BI-VENTRICULAR PACEMAKER INSERTION (CRT-P);  Surgeon: Evans Lance, MD;  Location: [facility] CATH LAB;  Service: Cardiovascular;  Laterality: N/A;    Prior to Admission medications   Medication Sig Start Date End Date Taking? Authorizing Provider  allopurinol (ZYLOPRIM) 300 MG tablet Take 300 mg by mouth daily.     Yes Historical Provider, MD  ALPRAZolam Duanne Moron) 0.5 MG tablet Take 1 mg by mouth at bedtime.  04/07/11  Yes Historical Provider, MD  calcium-vitamin D (OSCAL WITH D) 500-200 MG-UNIT per tablet Take 1 tablet by mouth daily.    Yes Historical Provider, MD  digoxin (LANOXIN) 0.125 MG tablet Take 0.0625 mg by mouth daily.    Yes Historical Provider, MD  furosemide (LASIX) 40 MG tablet Take 80 mg by mouth daily. 02/09/15  Yes Historical Provider, MD  HYDROcodone-acetaminophen (NORCO/VICODIN) 5-325 MG per tablet Take 0.5 tablets by mouth at bedtime.  10/08/12  Yes Historical Provider, MD  ipratropium (ATROVENT) 0.03 % nasal  spray Place 1-2 sprays into both nostrils 3 (three) times daily as needed for rhinitis (congestion).  11/19/15  Yes Historical Provider, MD  lovastatin (MEVACOR) 20 MG tablet Take 20 mg by mouth at bedtime. Reported on 11/24/2015   Yes Historical Provider, MD  megestrol (MEGACE) 40 MG/ML suspension Take 400 mg by mouth 2 (two) times daily. 10 mls - every morning and at 3pm   Yes Historical Provider, MD  Melatonin 10 MG TABS Take 10 mg by mouth at bedtime.   Yes Historical Provider, MD  multivitamin-lutein (OCUVITE-LUTEIN) CAPS Take 1 capsule by mouth daily.   Yes Historical Provider, MD  Naphazoline HCl (CLEAR EYES OP) Place 1 drop into both eyes daily as needed (irritation/ eye drop).   Yes Historical Provider, MD  spironolactone (ALDACTONE) 25 MG tablet  Take 25 mg by mouth daily.   Yes Historical Provider, MD  Triamcinolone Acetonide (NASACORT AQ NA) Place 1 spray into both nostrils daily.   Yes Historical Provider, MD  vitamin B-12 (CYANOCOBALAMIN) 1000 MCG tablet Take 1,000 mcg by mouth daily.   Yes Historical Provider, MD  warfarin (COUMADIN) 2 MG tablet Take 1-2 mg by mouth at bedtime. Take 1/2 tablet (1 mg) by mouth Monday, Wednesday, Friday, take 1 tablet (2 mg) on Sunday, Tuesday, Thursday, Saturday 11/09/15  Yes Historical Provider, MD    Current Facility-Administered Medications  Medication Dose Route Frequency Provider Last Rate Last Dose  . 0.9 %  sodium chloride infusion  10 mL/hr Intravenous Once Adeleke Oni, MD      . 0.9 %  sodium chloride infusion  250 mL Intravenous PRN Ejiroghene E Emokpae, MD      . 0.9 %  sodium chloride infusion   Intravenous Continuous Ejiroghene Arlyce Dice, MD 100 mL/hr at 12/05/15 0636    . 0.9 %  sodium chloride infusion   Intravenous Once Ejiroghene E Emokpae, MD      . 0.9 %  sodium chloride infusion   Intravenous Once Jule Ser, DO      . acetaminophen (TYLENOL) tablet 650 mg  650 mg Oral Q4H PRN Ejiroghene E Emokpae, MD      . insulin regular  (NOVOLIN R,HUMULIN R) 250 Units in sodium chloride 0.9 % 250 mL (1 Units/mL) infusion   Intravenous Continuous Dorothy Spark, MD 3.9 mL/hr at 12/05/15 0813    . ipratropium (ATROVENT) 0.06 % nasal spray 1-2 spray  1-2 spray Each Nare TID PRN Ejiroghene Arlyce Dice, MD      . phytonadione (VITAMIN K) 5 mg in dextrose 5 % 50 mL IVPB  5 mg Intravenous Once Everlene Balls, MD   5 mg at 12/05/15 0029    Allergies as of 12/04/2015 - Review Complete 12/04/2015  Allergen Reaction Noted  . Penicillins Hives and Itching 04/14/2011    Family History  Problem Relation Age of Onset  . Heart attack Mother     Social History   Social History  . Marital Status: Married    Spouse Name: N/A  . Number of Children: 1  . Years of Education: N/A   Occupational History  .     Social History Main Topics  . Smoking status: Former Smoker -- 1.00 packs/day for 17 years    Types: Cigarettes    Quit date: 02/28/1960  . Smokeless tobacco: Never Used  . Alcohol Use: Yes     Comment: 03/20/2013 "been about 2-3 months since I've had alcohol; never been a heavy drinker; was 1-2 drinks before dinner probably 5 nights/wk"  . Drug Use: No  . Sexual Activity: Not Currently   Other Topics Concern  . Not on file   Social History Narrative    Review of Systems: Ten point ROS is O/W negative except as mentioned in HPI.  Physical Exam: Vital signs in last 24 hours: Temp:  [97.4 F (36.3 C)-98.4 F (36.9 C)] 97.6 F (36.4 C) (04/22 0810) Pulse Rate:  [59-90] 89 (04/22 0810) Resp:  [12-26] 19 (04/22 0810) BP: (74-132)/(37-99) 129/70 mmHg (04/22 0730) SpO2:  [96 %-100 %] 100 % (04/22 0810) Weight:  [106 lb 4.2 oz (48.2 kg)-127 lb 13.9 oz (58 kg)] 106 lb 4.2 oz (48.2 kg) (04/22 0554) Last BM Date: 12/05/15 General:  Alert, acutely ill-appearing, pleasant and cooperative; very thin, pale Head:  Normocephalic and atraumatic.  Eyes:  Sclera clear, no icterus.  Conjunctiva pale.  Left facial ecchymosis. Ears:   Normal auditory acuity. Mouth:  No deformity or lesions.   Lungs:  Clear throughout to auscultation.  No wheezes, crackles, or rhonchi.  Heart:  Regular rate and rhythm; no murmurs, clicks, rubs, or gallops.  AICD right chest wall. Abdomen:  Soft, non-distended.  BS present.  Non-tender. Rectal:   Red blood with clots oozing from rectum.  Msk:  Symmetrical without gross deformities. Pulses:  Normal pulses noted. Extremities:  Without clubbing or edema. Neurologic:  Alert and  oriented x4;  grossly normal neurologically. Skin:  Intact without significant lesions or rashes. Psych:  Alert and cooperative. Normal mood and affect.  Intake/Output from previous day: 04/21 0701 - 04/22 0700 In: 3435 [I.V.:3100; Blood:335] Out: 200 [Urine:200] Intake/Output this shift: Total I/O In: 140 [I.V.:140] Out: 100 [Urine:100]  Lab Results:  Recent Labs  12/04/15 2350 12/05/15 0008 12/05/15 0055 12/05/15 0634  WBC 14.3*  --  15.8* 18.7*  HGB 13.1 12.9* 11.2* 8.4*  HCT 38.0* 38.0* 31.3* 24.2*  PLT 221  --  198 132*   BMET  Recent Labs  12/04/15 2350 12/05/15 0008 12/05/15 0345  NA 124* 127* 129*  K 5.2* 5.0 5.5*  CL 89* 94* 100*  CO2 20*  --  17*  GLUCOSE 288* 261* 269*  BUN 97* 85* 88*  CREATININE 2.43* 2.00* 2.07*  CALCIUM 8.6*  --  7.2*   LFT  Recent Labs  12/04/15 2350  PROT 6.1*  ALBUMIN 3.4*  AST 44*  ALT 45  ALKPHOS 134*  BILITOT 1.1   PT/INR  Recent Labs  12/04/15 2350  LABPROT 44.0*  INR 4.86*   Studies/Results: Dg Chest Port 1 View  12/04/2015  CLINICAL DATA:  Altered mental status.  GI bleed. EXAM: PORTABLE CHEST 1 VIEW COMPARISON:  11/27/2015 FINDINGS: Cardiac pacemaker. Postoperative changes in the mediastinum. Shallow inspiration. Heart size and pulmonary vascularity are normal. No focal airspace disease or consolidation in the lungs. No blunting of costophrenic angles. No pneumothorax. Old left rib fractures. Old left clavicular fracture.  IMPRESSION: No active disease. Electronically Signed   By: Lucienne Capers M.D.   On: 12/04/2015 23:57

## 2015-12-05 NOTE — H&P (Signed)
PULMONARY / CRITICAL CARE MEDICINE   Name: [name] MRN: [id number] DOB: 1925-06-11    ADMISSION DATE:  12/04/2015 CONSULTATION DATE:  12/04/15  REFERRING MD:  ED physician  CHIEF COMPLAINT:  GI bleed  HISTORY OF PRESENT ILLNESS:   [name] is a 82 y.o. man with past medical history of atrial fibrillation (on Coumadin), coronary artery disease s/p CABG, HFrEF, GERD, hemorrhoids, DM type 2, bladder cancer, GERD who presents to the emergency department with complaint of bright red blood per rectum.  Patient reports symptoms began last night (4/21) around 7pm when he noticed that he was bleeding all over the floor.  He has prior history of hemorrhoids but no history of GI bleed to this extent.  He describes the blood as bright red.  He reports a strange sensation in his rectum with bleeding but otherwise does not have any symptoms or pain.  Patient has recent history of a fall earlier in the month requiring an ED visit.  He reports some constipation about 2 weeks ago and difficulty urinating that has resolved.  He denies symptoms of feeling lightheaded, SOB, chest pain, nausea, vomiting, abdominal pain, diarrhea, fever or chills, weight loss.  He does report a decreased appetite.  He has been on Coumadin for at least "a couple years".  Patient had colonoscopy about 10 years unremarkable except for a hiatal hernia.  He denies EtOH use.  Reports he quit smoking in 1961.    In the ED, patient still with active bleeding.  He is receiving 3L normal saline, vitamin K, FFP x 2 units, and 1 unit of PRBC.  His hemoglobin is 11.2, PT/INR 44/4.86.  EKG shows new ST changes and T-wave inversions in the anterior leads.  PAST MEDICAL HISTORY :  He  has a past medical history of Atrial fibrillation ([place]); Coronary artery disease; Renal insufficiency; Hypertension; Hyperlipidemia; Gout; CHF (congestive heart failure) ([place]); GERD (gastroesophageal reflux disease); Pacemaker; Shortness of breath; Type II  diabetes mellitus ([place]); H/O hiatal hernia; Arthritis; Depression; Anxiety; and Bladder cancer ([place]).  PAST SURGICAL HISTORY: He  has past surgical history that includes Transurethral resection of prostate [id number]; 2004); Nasal septum surgery; US ECHOCARDIOGRAPHY (04-17-2009); Cardiovascular stress test (04-17-2009); Coronary artery bypass graft (02/19/2003); Carotid endarterectomy (Right, 1990's); Icd lead removal (Left, 03/01/2013); Pacemaker lead removal (Left, 03/01/2013); Pacemaker insertion (N/A, 03/01/2013); Bi-ventricular pacemaker insertion (crt-p) (Right, 03/20/2013); Hemorrhoid surgery; Tonsillectomy and adenoidectomy (1933); Cataract extraction w/ intraocular lens  implant, bilateral (Bilateral, ~ 2000); and bi-ventricular pacemaker insertion (N/A, 03/20/2013).  Allergies  Allergen Reactions  . Penicillins Hives and Itching    Has patient had a PCN reaction causing immediate rash, facial/tongue/throat swelling, SOB or lightheadedness with hypotension: YES Has patient had a PCN reaction causing severe rash involving mucus membranes or skin necrosis: NO Has patient had a PCN reaction that required hospitalizationNO Has patient had a PCN reaction occurring within the last 10 years: NO If all of the above answers are "NO", then may proceed with Cephalosporin use.    No current facility-administered medications on file prior to encounter.   Current Outpatient Prescriptions on File Prior to Encounter  Medication Sig  . allopurinol (ZYLOPRIM) 300 MG tablet Take 300 mg by mouth daily.    [name] ALPRAZolam (XANAX) 0.5 MG tablet Take 1 mg by mouth at bedtime.   . calcium-vitamin D (OSCAL WITH D) 500-200 MG-UNIT per tablet Take 1 tablet by mouth daily.   . digoxin (LANOXIN) 0.125 MG tablet Take 0.0625 mg  by mouth daily.   . furosemide (LASIX) 40 MG tablet Take 80 mg by mouth daily.  [name] HYDROcodone-acetaminophen (NORCO/VICODIN) 5-325 MG per tablet Take 0.5 tablets by mouth at bedtime.   [name] ipratropium  (ATROVENT) 0.03 % nasal spray Place 1-2 sprays into both nostrils 3 (three) times daily as needed for rhinitis (congestion).   [name] lovastatin (MEVACOR) 20 MG tablet Take 20 mg by mouth at bedtime. Reported on 11/24/2015  . megestrol (MEGACE) 40 MG/ML suspension Take 400 mg by mouth 2 (two) times daily. 10 mls - every morning and at 3pm  . Melatonin 10 MG TABS Take 10 mg by mouth at bedtime.  . multivitamin-lutein (OCUVITE-LUTEIN) CAPS Take 1 capsule by mouth daily.  . Naphazoline HCl (CLEAR EYES OP) Place 1 drop into both eyes daily as needed (irritation/ eye drop).  [name] spironolactone (ALDACTONE) 25 MG tablet Take 25 mg by mouth daily.  . Triamcinolone Acetonide (NASACORT AQ NA) Place 1 spray into both nostrils daily.  . vitamin B-12 (CYANOCOBALAMIN) 1000 MCG tablet Take 1,000 mcg by mouth daily.  [name] warfarin (COUMADIN) 2 MG tablet Take 1-2 mg by mouth at bedtime. Take 1/2 tablet (1 mg) by mouth Monday, Wednesday, Friday, take 1 tablet (2 mg) on Sunday, Tuesday, Thursday, Saturday    FAMILY HISTORY:  His indicated that his mother is deceased. He indicated that his father is deceased. He indicated that his maternal grandmother is deceased. He indicated that his maternal grandfather is deceased. He indicated that his paternal grandmother is deceased. He indicated that his paternal grandfather is deceased.   SOCIAL HISTORY: He  reports that he quit smoking about 55 years ago. His smoking use included Cigarettes. He has a 17 pack-year smoking history. He has never used smokeless tobacco. He reports that he drinks alcohol. He reports that he does not use illicit drugs.  REVIEW OF SYSTEMS:   Per HPI   VITAL SIGNS: BP 90/51 mmHg  Pulse 61  Temp(Src) 98.4 F (36.9 C) (Axillary)  Resp 22  Ht 5\' 7"  (1.702 m)  Wt 127 lb 13.9 oz (58 kg)  BMI 20.02 kg/m2  SpO2 100%  HEMODYNAMICS:    VENTILATOR SETTINGS:    INTAKE / OUTPUT:    PHYSICAL EXAMINATION: General:  Elderly appearing, thin, caucasian  male, mildly distressed/anxious Neuro:  Alert and oriented, normal mentation, no focal deficits HEENT:  Left facial and periorbital ecchymosis, dry mucus membranes Cardiovascular:  RRR, normal s1, s2, intact distal pulses, warm extremities.  AICD pacemaker Right chest wall Lungs:  No increased work of breathing, CTAB Abdomen:  Soft, non-tender, non-distended Musculoskeletal:  No deformities Skin:  Pale, intact, no rashes, dry blood on feet  LABS:  BMET  Recent Labs Lab 12/04/15 2350 12/05/15 0008  NA 124* 127*  K 5.2* 5.0  CL 89* 94*  CO2 20*  --   BUN 97* 85*  CREATININE 2.43* 2.00*  GLUCOSE 288* 261*    Electrolytes  Recent Labs Lab 12/04/15 2350  CALCIUM 8.6*  MG 1.8    CBC  Recent Labs Lab 12/04/15 2350 12/05/15 0008 12/05/15 0055  WBC 14.3*  --  15.8*  HGB 13.1 12.9* 11.2*  HCT 38.0* 38.0* 31.3*  PLT 221  --  198    Coag's  Recent Labs Lab 12/04/15 2350  INR 4.86*    Sepsis Markers  Recent Labs Lab 12/05/15 0009  LATICACIDVEN 4.23*    ABG No results for input(s): PHART, PCO2ART, PO2ART in the last 168 hours.  Liver Enzymes  Recent Labs Lab 12/04/15 2350  AST 44*  ALT 45  ALKPHOS 134*  BILITOT 1.1  ALBUMIN 3.4*    Cardiac Enzymes No results for input(s): TROPONINI, PROBNP in the last 168 hours.  Glucose No results for input(s): GLUCAP in the last 168 hours.  Imaging Dg Chest Port 1 View  12/04/2015  CLINICAL DATA:  Altered mental status.  GI bleed. EXAM: PORTABLE CHEST 1 VIEW COMPARISON:  11/27/2015 FINDINGS: Cardiac pacemaker. Postoperative changes in the mediastinum. Shallow inspiration. Heart size and pulmonary vascularity are normal. No focal airspace disease or consolidation in the lungs. No blunting of costophrenic angles. No pneumothorax. Old left rib fractures. Old left clavicular fracture. IMPRESSION: No active disease. Electronically Signed   By: Lucienne Capers M.D.   On: 12/04/2015 23:57     STUDIES:  4/22  CXR > no acute process  CULTURES: n/a  ANTIBIOTICS: n/a  SIGNIFICANT EVENTS: 4/22 admit to ICU for GI bleed while on coumadin and persistent bleeding, hypotension  LINES/TUBES: Foley catheter 4/22 PIV x 3  DISCUSSION: 82 y.o. man with past medical history of atrial fibrillation (on Coumadin), coronary artery disease s/p CABG, HFrEF, GERD, hemorrhoids, DM type 2, bladder cancer, GERD who presents to the emergency department with complaint of bright red blood per rectum.  Receiving 1 unit PRBC, 2 units FFP, vitamin K, IVF  ASSESSMENT / PLAN:  PULMONARY A: Hx of SOB with CHF P:   - Atrovent TID prn  CARDIOVASCULAR A:  EKG changes Shock likely hypovolemic/hemorrhagic 2/2 lower GI bleed Troponin leak - likely demand Pacemaker - currently V-paced in 60s occasionally 50s Hx atrial fibrillation Hx CAD Hx HTN Hx of HFrEF P:  - repeat EKG - cycle troponins - trend lactic acid - asked cardiology to turn up his pacer to 90, device rep to come do so - allow for permissive MAP ~55 - telemetry - judicious IVF resuscitation as needed given CHF - hold BP meds - see GI section  RENAL A:   Renal insufficiency Hyponatremia P:   - NS IV fluids - follow BMET, magnesium, phos - I/O's, daily weight  GASTROINTESTINAL A:   Lower GI Bleed likely 2/2 AVM vs diverticular P:   - stop PPI gtt - 2 Large borre IV - CBC Q4H - IVF resuscitation with blood products as needed - PRBC x 2 initially in ED.  Still having bleeding so will order 2 more units - ensure type and screen - vitamin K given in ED - FFP x 2 initially in ED.  Still having bleeding so will order 2 more units for high risk, active bleed - GI consult - NPO  HEMATOLOGIC A:   Lower GI Bleed  Coagulopathy P:  - see GI section - follow PT/INR  INFECTIOUS A:   No acute issues P:     ENDOCRINE A:   DM type 2    P:   - follow CBG's - may need SSI  NEUROLOGIC A:   No acute issues P:   RASS goal:  0   FAMILY  - Updates: per patient, he lives with his wife who is currently out of town at [employer].  - Inter-disciplinary family meet or Palliative Care meeting due by:  day 7  Attending attestation: I have seen and evaluated the patient with the residents.  I agree with the plan as stated above.  I have already amended as needed.  Total critical care time: 30 min  Critical care time was exclusive of separately  billable procedures and treating other patients.  Critical care was necessary to treat or prevent imminent or life-threatening deterioration.  Critical care was time spent personally by me on the following activities: development of treatment plan with patient and/or surrogate as well as nursing, discussions with consultants, evaluation of patient's response to treatment, examination of patient, obtaining history from patient or surrogate, ordering and performing treatments and interventions, ordering and review of laboratory studies, ordering and review of radiographic studies, pulse oximetry and re-evaluation of patient's condition.   Meribeth Mattes, DO., MS [facility]     P[facility] and [place] Pager: [phone]  12/05/2015, 2:26 AM

## 2015-12-05 NOTE — ED Notes (Signed)
Pt reports more oozing of blood from rectum - soaked brief and chux with bright red blood - linen changed.

## 2015-12-05 NOTE — Progress Notes (Signed)
Reported to Dr. Chase Caller critical labs: Hgb 6.9, lactic 5.0 PT/INR 22.9 and 2.04. Order will be placed for a repeat lactic at 1500

## 2015-12-05 NOTE — ED Notes (Signed)
Spoke with resident, and discussed patient status. bp trending down even while in trendelenburg, and troponin result of 0.13 at 0015. MD acknowledges, advises to repeat troponin at 0600. Awaiting new orders in regards to blood pressure.

## 2015-12-06 ENCOUNTER — Inpatient Hospital Stay (HOSPITAL_COMMUNITY): Payer: Medicare Other

## 2015-12-06 DIAGNOSIS — E872 Acidosis: Secondary | ICD-10-CM

## 2015-12-06 DIAGNOSIS — K922 Gastrointestinal hemorrhage, unspecified: Secondary | ICD-10-CM | POA: Diagnosis present

## 2015-12-06 LAB — BRAIN NATRIURETIC PEPTIDE: B Natriuretic Peptide: 2311.3 pg/mL — ABNORMAL HIGH (ref 0.0–100.0)

## 2015-12-06 LAB — CBC
HCT: 21.8 % — ABNORMAL LOW (ref 39.0–52.0)
HEMATOCRIT: 18.1 % — AB (ref 39.0–52.0)
HEMOGLOBIN: 6.5 g/dL — AB (ref 13.0–17.0)
Hemoglobin: 7.5 g/dL — ABNORMAL LOW (ref 13.0–17.0)
MCH: 29.4 pg (ref 26.0–34.0)
MCH: 30.7 pg (ref 26.0–34.0)
MCHC: 34.4 g/dL (ref 30.0–36.0)
MCHC: 35.9 g/dL (ref 30.0–36.0)
MCV: 85.5 fL (ref 78.0–100.0)
MCV: 85.4 fL (ref 78.0–100.0)
PLATELETS: 110 10*3/uL — AB (ref 150–400)
PLATELETS: 81 10*3/uL — AB (ref 150–400)
RBC: 2.55 MIL/uL — AB (ref 4.22–5.81)
RBC: 2.12 MIL/uL — AB (ref 4.22–5.81)
RDW: 14.8 % (ref 11.5–15.5)
RDW: 14.9 % (ref 11.5–15.5)
WBC: 22.3 10*3/uL — AB (ref 4.0–10.5)
WBC: 20.4 10*3/uL — AB (ref 4.0–10.5)

## 2015-12-06 LAB — PREPARE FRESH FROZEN PLASMA
UNIT DIVISION: 0
UNIT DIVISION: 0
UNIT DIVISION: 0
Unit division: 0
Unit division: 0
Unit division: 0

## 2015-12-06 LAB — MAGNESIUM: MAGNESIUM: 1.4 mg/dL — AB (ref 1.7–2.4)

## 2015-12-06 LAB — GLUCOSE, CAPILLARY
Glucose-Capillary: 141 mg/dL — ABNORMAL HIGH (ref 65–99)
Glucose-Capillary: 152 mg/dL — ABNORMAL HIGH (ref 65–99)
Glucose-Capillary: 159 mg/dL — ABNORMAL HIGH (ref 65–99)
Glucose-Capillary: 213 mg/dL — ABNORMAL HIGH (ref 65–99)
Glucose-Capillary: 225 mg/dL — ABNORMAL HIGH (ref 65–99)
Glucose-Capillary: 91 mg/dL (ref 65–99)

## 2015-12-06 LAB — BASIC METABOLIC PANEL
ANION GAP: 8 (ref 5–15)
BUN: 74 mg/dL — AB (ref 6–20)
CO2: 17 mmol/L — ABNORMAL LOW (ref 22–32)
Calcium: 6.5 mg/dL — ABNORMAL LOW (ref 8.9–10.3)
Chloride: 105 mmol/L (ref 101–111)
Creatinine, Ser: 1.79 mg/dL — ABNORMAL HIGH (ref 0.61–1.24)
GFR, EST AFRICAN AMERICAN: 37 mL/min — AB (ref >60)
GFR, EST NON AFRICAN AMERICAN: 32 mL/min — AB (ref >60)
Glucose, Bld: 94 mg/dL (ref 65–99)
POTASSIUM: 5.3 mmol/L — AB (ref 3.5–5.1)
SODIUM: 130 mmol/L — AB (ref 135–145)

## 2015-12-06 LAB — LACTIC ACID, PLASMA: LACTIC ACID, VENOUS: 2.1 mmol/L — AB (ref 0.5–2.0)

## 2015-12-06 LAB — PREPARE RBC (CROSSMATCH)

## 2015-12-06 LAB — PREPARE PLATELET PHERESIS: UNIT DIVISION: 0

## 2015-12-06 LAB — PHOSPHORUS: Phosphorus: 4 mg/dL (ref 2.5–4.6)

## 2015-12-06 LAB — CORTISOL: Cortisol, Plasma: >100 ug/dL

## 2015-12-06 LAB — PROTIME-INR
INR: 2.05 — ABNORMAL HIGH (ref 0.00–1.49)
Prothrombin Time: 23 s — ABNORMAL HIGH (ref 11.6–15.2)

## 2015-12-06 MED ORDER — MAGNESIUM SULFATE 2 GM/50ML IV SOLN
2.0000 g | Freq: Once | INTRAVENOUS | Status: AC
  Administered 2015-12-06: 2 g via INTRAVENOUS
  Filled 2015-12-06: qty 50

## 2015-12-06 MED ORDER — SODIUM CHLORIDE 0.9 % IV SOLN
Freq: Once | INTRAVENOUS | Status: AC
  Administered 2015-12-06: 22:00:00 via INTRAVENOUS

## 2015-12-06 MED ORDER — HYDROCORTISONE NA SUCCINATE PF 100 MG IJ SOLR
50.0000 mg | Freq: Four times a day (QID) | INTRAMUSCULAR | Status: DC
  Administered 2015-12-06 – 2015-12-07 (×4): 50 mg via INTRAVENOUS
  Filled 2015-12-06: qty 1
  Filled 2015-12-06: qty 2
  Filled 2015-12-06: qty 1
  Filled 2015-12-06: qty 2
  Filled 2015-12-06 (×2): qty 1

## 2015-12-06 NOTE — Progress Notes (Signed)
PULMONARY / CRITICAL CARE MEDICINE   Name: [name] MRN: [id number] DOB: 07/17/1925    ADMISSION DATE:  12/04/2015 CONSULTATION DATE:  12/04/15  REFERRING MD:  ED physician  CHIEF COMPLAINT:  GI bleed  Brief Note : 82yo male admitted 12/04/15 with acute GIB . On coumadin with INR at 4.86 on admit.   HISTORY OF PRESENT ILLNESS:   [name] is a 82 y.o. man with past medical history of atrial fibrillation (on Coumadin), coronary artery disease s/p CABG, HFrEF, GERD, hemorrhoids, DM type 2, bladder cancer, GERD who presents to the emergency department with complaint of bright red blood per rectum.  Patient reports symptoms began last night (4/21) around 7pm when he noticed that he was bleeding all over the floor.  He has prior history of hemorrhoids but no history of GI bleed to this extent.  He describes the blood as bright red.  He reports a strange sensation in his rectum with bleeding but otherwise does not have any symptoms or pain.  Patient has recent history of a fall earlier in the month requiring an ED visit.  He reports some constipation about 2 weeks ago and difficulty urinating that has resolved.  He denies symptoms of feeling lightheaded, SOB, chest pain, nausea, vomiting, abdominal pain, diarrhea, fever or chills, weight loss.  He does report a decreased appetite.  He has been on Coumadin for at least "a couple years".  Patient had colonoscopy about 10 years unremarkable except for a hiatal hernia.  He denies EtOH use.  Reports he quit smoking in 1961.    In the ED, patient still with active bleeding.  He is receiving 3L normal saline, vitamin K, FFP x 2 units, and 1 unit of PRBC.  His hemoglobin is 11.2, PT/INR 44/4.86.  EKG shows new ST changes and T-wave inversions in the anterior leads.   Subjective/Overnight :  No further bleeding overnight. BP stable on Levo.  Pt is alert sitting up in bed. No chest pain, sob, dizziness.    VITAL SIGNS: BP 111/57 mmHg  Pulse 94   Temp(Src) 98.8 F (37.1 C) (Oral)  Resp 28  Ht 5\' 8"  (1.727 m)  Wt 110 lb 10.7 oz (50.2 kg)  BMI 16.83 kg/m2  SpO2 100%  HEMODYNAMICS:    VENTILATOR SETTINGS:    INTAKE / OUTPUT: I/O last 3 completed shifts: In: 13261.2 [P.O.:800; I.V.:8320.1; Blood:4091.1; IV Piggyback:50] Out: 1430 [id number]  PHYSICAL EXAMINATION: General:  Elderly appearing, thin, caucasian male, calm  Neuro:  Alert and oriented, normal mentation, no focal deficits HEENT:  Left facial and periorbital ecchymosis, dry mucus membranes Cardiovascular:  RRR, normal s1, s2, intact distal pulses, warm extremities.  AICD pacemaker Right chest wall Lungs:  No increased work of breathing, CTAB Abdomen:  Soft, non-tender, non-distended, BS +  Musculoskeletal:  No deformities Skin:  Pale, intact, no rashes  LABS:  BMET  Recent Labs Lab 12/05/15 1002 12/05/15 1520 12/06/15 0518  NA 136 136 130*  K 4.7 4.6 5.3*  CL 107 109 105  CO2 15* 12* 17*  BUN 83* 78* 74*  CREATININE 2.11* 1.89* 1.79*  GLUCOSE 203* 112* 94    Electrolytes  Recent Labs Lab 12/04/15 2350  12/05/15 1002 12/05/15 1520 12/06/15 0518  CALCIUM 8.6*  < > 7.1* 6.8* 6.5*  MG 1.8  --  1.7  --  1.4*  PHOS  --   --  3.8  --  4.0  < > = values in this interval  not displayed.  CBC  Recent Labs Lab 12/05/15 1843 12/05/15 2206 12/06/15 0518  WBC 18.6* 26.3* 22.3*  HGB 9.4* 8.6* 7.5*  HCT 28.0* 25.3* 21.8*  PLT 69* 116* 110*    Coag's  Recent Labs Lab 12/04/15 2350 12/05/15 1002 12/05/15 1843  INR 4.86* 2.04* 2.31*    Sepsis Markers  Recent Labs Lab 12/05/15 0516 12/05/15 1002 12/05/15 1520  LATICACIDVEN 2.0 5.0* 6.9*    ABG No results for input(s): PHART, PCO2ART, PO2ART in the last 168 hours.  Liver Enzymes  Recent Labs Lab 12/04/15 2350  AST 44*  ALT 45  ALKPHOS 134*  BILITOT 1.1  ALBUMIN 3.4*    Cardiac Enzymes  Recent Labs Lab 12/05/15 1002 12/05/15 1520 12/05/15 1843  TROPONINI 0.10*  0.13* 0.20*    Glucose  Recent Labs Lab 12/05/15 1429 12/05/15 1532 12/05/15 1632 12/05/15 2016 12/06/15 0032 12/06/15 0348  GLUCAP 118* 98 85 150* 225* 152*    Imaging Nm Gi Blood Loss  12/05/2015  CLINICAL DATA:  Bright red blood per rectum. Patient actively bleeding. Gastrointestinal bleeding K92.2 (ICD-10-CM) Gastrointestinal hemorrhage, unspecified gastritis, unspecified gastrointestinal hemorrhage type K92.2 (ICD-10-CM) EXAM: NUCLEAR MEDICINE GASTROINTESTINAL BLEEDING SCAN TECHNIQUE: Sequential abdominal images were obtained following intravenous administration of Tc-60m labeled red blood cells. RADIOPHARMACEUTICALS:  25.2 mCi Tc-41m in-vitro labeled red cells. COMPARISON:  CT, 11/27/2015 FINDINGS: There is a small elongated focus of uptake in the right central pelvis, adjacent to the right dome of the bladder and right common iliac artery, which becomes apparent approximately 20 minutes following the injection of radiotracer, fading later during the examination. Although somewhat equivocal, this suggests a gastro intestinal bleeding site from the sigmoid colon. There is no other evidence of a GI bleeding source. No other abnormal radiotracer accumulation. IMPRESSION: 1. Small area of abnormal radiotracer accumulation in right central pelvis suggesting a GI bleeding source from the sigmoid colon. Electronically Signed   By: Lajean Manes M.D.   On: 12/05/2015 14:51   STUDIES:  4/22 CXR > no acute process  CULTURES: n/a  ANTIBIOTICS: n/a  SIGNIFICANT EVENTS: 4/22 admit to ICU for GI bleed while on coumadin and persistent bleeding, hypotension 4/22 rectal ulcer cauterized   LINES/TUBES: Foley catheter 4/22 PIV x 3 PICC 4/23 >  DISCUSSION: 82 y.o. man with past medical history of atrial fibrillation (on Coumadin), coronary artery disease s/p CABG, HFrEF, GERD, hemorrhoids, DM type 2, bladder cancer, GERD who presents to the emergency department with complaint of bright red  blood per rectum.  Received 6  unit PRBC, 3/4  units FFP, vitamin K,  And IVF   ASSESSMENT / PLAN:  PULMONARY A: Hx of SOB with CHF P:   - Atrovent TID prn  CARDIOVASCULAR A:  EKG changes Shock likely hypovolemic/hemorrhagic 2/2 lower GI bleed Troponin leak - likely demand Pacemaker - currently V-paced in 60s occasionally 11s Hx atrial fibrillation Hx CAD Hx HTN Hx of HFrEF 4/22>lactate tr down  P:  - telemetry - hold BP meds, lasix and aldactone  - dig on hold for now - see GI section - BNP 2300  RENAL A:   Hyponatremia Hyperkalemia  Hypomag Acute on chronic renal failure  P:   - daily BMET - I/O's, daily weight - Replace lytes  GASTROINTESTINAL A:   Lower GI Bleed likely 2/2 rectal ulcer 4/22>s/p 6 u PRBC , 3/4 FFP  4/22> flex sig with ulcer cauterization P:   - CBC Q12H - IVF resuscitation with blood products as needed  -  Clear liquid diets  HEMATOLOGIC A:   Lower GI Bleed  Coagulopathy 4/22 >hbg 11.2 >8.4  INR 4.8 P:  - see GI section - daily INR  - zofran prn - SCDs  INFECTIOUS A:   No acute issues P:   -Monitor temp and wbc   ENDOCRINE A:   DM type 2    P:   - SSI - Lantus 20 units qhs  NEUROLOGIC A:   No acute issues P:   RASS goal: 0   FAMILY  - Updates: per patient, he lives with his wife who is currently out of town at [employer].  - Inter-disciplinary family meet or Palliative Care meeting due by:  day 7  Maryellen Pile, MD [facility] PGY-1 [phone]

## 2015-12-06 NOTE — Progress Notes (Signed)
PICC attempted in Left arm due to PM on Right side.  Unable to thread PICC centrally by 2 PICC RN's.  Pt states has hx of Defibrillator removal from Left side.  Basilic vein measured with 10 % occupancy and cephalic with 25 % occupancy.  MD notified for alternative central line.

## 2015-12-06 NOTE — Progress Notes (Signed)
eLink Physician-Brief Progress Note Patient Name: [name] DOB: Jul 02, 1925 MRN: [id number]   Date of Service  12/06/2015  HPI/Events of Note  Hypomag  eICU Interventions  Mag replaced     Intervention Category Intermediate Interventions: Electrolyte abnormality - evaluation and management  Simmie Garin 12/06/2015, 6:24 AM

## 2015-12-06 NOTE — Progress Notes (Signed)
Lactic of 2.1 reported to Dr. Maryellen Pile

## 2015-12-06 NOTE — Progress Notes (Signed)
[facility] Progress Note  Assessment / Plan: -Anorectal ulcer with bleeding artery s/p epi and bicap 4/22 -Massive GI bleed: Secondary to the above. -Acute blood loss anemia: Hgb 7.5 grams this AM.  S/p ? 7 units PRBC's and one unit of platelets. -Coumadin coagulopathy: INR 2.31 this AM.  S/p 6 units FFP.  *Continue to monitor labs, transfuse prn. *Will need to keep stools soft in the future.   LOS: 1 day   [name], Roberto D.  12/06/2015, 8:52 AM  Pager number [phone]    [place] Attending   I have taken an interval history, reviewed the chart and examined the patient. I agree with the Advanced Practitioner's note, impression and recommendations.    Bleeding has stopped. Has sequelae of hypotension/bleeding and tx but looks a whole lot better. Denies any hx of rectal pain/fissure-like sxs. BNP way up, creat 1.79 (better).  I think we can avoid FFP, etc for now and hopefully will recover but that would be first or second thing I treat if bleeding recurs.  Go to full liquid diet.  Given his recent fall and this bleed - though anticipate full recovery - would be awhile if ever that I would restart anticoag Tx. i know stroke risks an issue but would wait at least 1 week to resume warfarin if at all. He will need a conversation re: pros and cons - his CHADS score is 4.  Roberto Mayer, MD, [facility] [phone] (pager) [phone] after 5 PM, weekends and holidays  12/06/2015 10:17 AM     Subjective:  Flex sig 4/22:  1) Small ulcer with bleeding artery at very distal rectum/anal verge. Treated with EPI 1:10K x 4 cc and multiple applications of 7 Fr Bicap - no bleeding at end of procedure  2) ? Proctitis - mild  3) Sigmoid diverticulosis  Feeling much better this AM.  Drinking liquids.  Bleeding minimal.  Objective:  Vital signs in last 24 hours: Temp:  [96.6 F (35.9 C)-99.7 F (37.6 C)] 98.8 F (37.1 C) (04/23  0349) Pulse Rate:  [37-116] 89 (04/23 0815) Resp:  [13-33] 21 (04/23 0815) BP: (60-151)/(43-99) 106/49 mmHg (04/23 0815) SpO2:  [100 %] 100 % (04/23 0815) Weight:  [110 lb 10.7 oz (50.2 kg)] 110 lb 10.7 oz (50.2 kg) (04/23 0500) Last BM Date: 12/05/15 General:  Alert, thin, in NAD; looks better today. Heart:  Regular rate and rhythm; AICD right chest wall Pulm:  CTAB.  No W/R/R. Abdomen:  Soft, non-distended.  BS present.  Non-tender. Extremities:  Without edema in lower but upper extremities puffy Neurologic:  Alert and oriented x 4;  grossly normal neurologically. Psych:  Alert and cooperative. Normal mood and affect.  Intake/Output from previous day: 04/22 0701 - 04/23 0700 In: 9826.2 [P.O.:800; I.V.:5220.1; Blood:3756.1; IV Piggyback:50] Out: 1230 [Urine:1230]  Lab Results:  Recent Labs  12/05/15 1843 12/05/15 2206 12/06/15 0518  WBC 18.6* 26.3* 22.3*  HGB 9.4* 8.6* 7.5*  HCT 28.0* 25.3* 21.8*  PLT 69* 116* 110*   BMET  Recent Labs  12/05/15 1002 12/05/15 1520 12/06/15 0518  NA 136 136 130*  K 4.7 4.6 5.3*  CL 107 109 105  CO2 15* 12* 17*  GLUCOSE 203* 112* 94  BUN 83* 78* 74*  CREATININE 2.11* 1.89* 1.79*  CALCIUM 7.1* 6.8* 6.5*   LFT  Recent Labs  12/04/15 2350  PROT 6.1*  ALBUMIN 3.4*  AST 44*  ALT 45  ALKPHOS 134*  BILITOT  1.1   PT/INR  Recent Labs  12/05/15 1002 12/05/15 1843  LABPROT 22.9* 25.2*  INR 2.04* 2.31*

## 2015-12-06 NOTE — Progress Notes (Signed)
CRITICAL VALUE ALERT  Critical value received:  HGB 6.5  Date of notification:  12/06/2015  Time of notification:  2011  Critical value read back: yes  Nurse who received alert:  Janann August  MD notified (1st page):  Dr. Juleen China  Time of first page:  20:16  MD notified (2nd page):  Time of second page:  Responding MD: Dr. Juleen China   Time MD responded: 20:27

## 2015-12-07 ENCOUNTER — Inpatient Hospital Stay (HOSPITAL_COMMUNITY): Payer: Medicare Other

## 2015-12-07 DIAGNOSIS — D689 Coagulation defect, unspecified: Secondary | ICD-10-CM

## 2015-12-07 DIAGNOSIS — K626 Ulcer of anus and rectum: Secondary | ICD-10-CM

## 2015-12-07 DIAGNOSIS — K922 Gastrointestinal hemorrhage, unspecified: Secondary | ICD-10-CM

## 2015-12-07 DIAGNOSIS — D62 Acute posthemorrhagic anemia: Secondary | ICD-10-CM

## 2015-12-07 DIAGNOSIS — I509 Heart failure, unspecified: Secondary | ICD-10-CM

## 2015-12-07 DIAGNOSIS — R579 Shock, unspecified: Secondary | ICD-10-CM

## 2015-12-07 LAB — GLUCOSE, CAPILLARY
GLUCOSE-CAPILLARY: 95 mg/dL (ref 65–99)
GLUCOSE-CAPILLARY: 193 mg/dL — AB (ref 65–99)
GLUCOSE-CAPILLARY: 173 mg/dL — AB (ref 65–99)
Glucose-Capillary: 110 mg/dL — ABNORMAL HIGH (ref 65–99)
Glucose-Capillary: 112 mg/dL — ABNORMAL HIGH (ref 65–99)
Glucose-Capillary: 179 mg/dL — ABNORMAL HIGH (ref 65–99)
Glucose-Capillary: 256 mg/dL — ABNORMAL HIGH (ref 65–99)

## 2015-12-07 LAB — CBC
HCT: 23.6 % — ABNORMAL LOW (ref 39.0–52.0)
HEMATOCRIT: 22.8 % — AB (ref 39.0–52.0)
HEMATOCRIT: 23.5 % — AB (ref 39.0–52.0)
HEMOGLOBIN: 7.8 g/dL — AB (ref 13.0–17.0)
HEMOGLOBIN: 8 g/dL — AB (ref 13.0–17.0)
Hemoglobin: 8.2 g/dL — ABNORMAL LOW (ref 13.0–17.0)
MCH: 29.3 pg (ref 26.0–34.0)
MCH: 29.4 pg (ref 26.0–34.0)
MCH: 29.9 pg (ref 26.0–34.0)
MCHC: 34 g/dL (ref 30.0–36.0)
MCHC: 34.2 g/dL (ref 30.0–36.0)
MCHC: 34.7 g/dL (ref 30.0–36.0)
MCV: 86.1 fL (ref 78.0–100.0)
MCV: 85.7 fL (ref 78.0–100.0)
MCV: 86.4 fL (ref 78.0–100.0)
PLATELETS: 78 10*3/uL — AB (ref 150–400)
Platelets: 74 10*3/uL — ABNORMAL LOW (ref 150–400)
Platelets: 89 10*3/uL — ABNORMAL LOW (ref 150–400)
RBC: 2.74 MIL/uL — ABNORMAL LOW (ref 4.22–5.81)
RBC: 2.66 MIL/uL — ABNORMAL LOW (ref 4.22–5.81)
RBC: 2.72 MIL/uL — AB (ref 4.22–5.81)
RDW: 14.2 % (ref 11.5–15.5)
RDW: 14.8 % (ref 11.5–15.5)
RDW: 14.9 % (ref 11.5–15.5)
WBC: 19.5 10*3/uL — AB (ref 4.0–10.5)
WBC: 18.9 10*3/uL — ABNORMAL HIGH (ref 4.0–10.5)
WBC: 20.3 10*3/uL — AB (ref 4.0–10.5)

## 2015-12-07 LAB — BASIC METABOLIC PANEL
Anion gap: 9 (ref 5–15)
BUN: 61 mg/dL — AB (ref 6–20)
CHLORIDE: 104 mmol/L (ref 101–111)
CO2: 19 mmol/L — ABNORMAL LOW (ref 22–32)
CREATININE: 1.46 mg/dL — AB (ref 0.61–1.24)
Calcium: 7.4 mg/dL — ABNORMAL LOW (ref 8.9–10.3)
GFR calc Af Amer: 48 mL/min — ABNORMAL LOW (ref >60)
GFR, EST NON AFRICAN AMERICAN: 41 mL/min — AB (ref >60)
Glucose, Bld: 105 mg/dL — ABNORMAL HIGH (ref 65–99)
Potassium: 5.1 mmol/L (ref 3.5–5.1)
SODIUM: 132 mmol/L — AB (ref 135–145)

## 2015-12-07 LAB — PROTIME-INR
INR: 1.79 — AB (ref 0.00–1.49)
Prothrombin Time: 20.8 seconds — ABNORMAL HIGH (ref 11.6–15.2)

## 2015-12-07 LAB — URINE CULTURE

## 2015-12-07 LAB — ECHOCARDIOGRAM COMPLETE
HEIGHTINCHES: 68 in
Weight: 1798.95 oz

## 2015-12-07 MED ORDER — INSULIN ASPART 100 UNIT/ML ~~LOC~~ SOLN
0.0000 [IU] | Freq: Three times a day (TID) | SUBCUTANEOUS | Status: DC
  Administered 2015-12-08 – 2015-12-09 (×3): 2 [IU] via SUBCUTANEOUS
  Administered 2015-12-10: 1 [IU] via SUBCUTANEOUS
  Administered 2015-12-10: 3 [IU] via SUBCUTANEOUS
  Administered 2015-12-10: 2 [IU] via SUBCUTANEOUS
  Administered 2015-12-11: 5 [IU] via SUBCUTANEOUS
  Administered 2015-12-11 (×2): 2 [IU] via SUBCUTANEOUS

## 2015-12-07 MED ORDER — ENSURE ENLIVE PO LIQD
237.0000 mL | Freq: Three times a day (TID) | ORAL | Status: DC
  Administered 2015-12-07 – 2015-12-11 (×13): 237 mL via ORAL

## 2015-12-07 MED ORDER — INSULIN ASPART 100 UNIT/ML ~~LOC~~ SOLN
0.0000 [IU] | Freq: Three times a day (TID) | SUBCUTANEOUS | Status: DC
  Administered 2015-12-07 – 2015-12-09 (×4): 2 [IU] via SUBCUTANEOUS
  Administered 2015-12-09 (×2): 1 [IU] via SUBCUTANEOUS

## 2015-12-07 MED ORDER — POLYETHYLENE GLYCOL 3350 17 G PO PACK
32.0000 g | PACK | Freq: Two times a day (BID) | ORAL | Status: AC
  Administered 2015-12-07 (×2): 32 g via ORAL
  Filled 2015-12-07 (×2): qty 2

## 2015-12-07 MED ORDER — POLYETHYLENE GLYCOL 3350 17 G PO PACK
17.0000 g | PACK | Freq: Every day | ORAL | Status: DC
Start: 2015-12-08 — End: 2015-12-11
  Administered 2015-12-08 – 2015-12-11 (×4): 17 g via ORAL
  Filled 2015-12-07 (×4): qty 1

## 2015-12-07 MED ORDER — INSULIN ASPART 100 UNIT/ML ~~LOC~~ SOLN
0.0000 [IU] | Freq: Every day | SUBCUTANEOUS | Status: DC
  Administered 2015-12-07: 3 [IU] via SUBCUTANEOUS

## 2015-12-07 NOTE — Progress Notes (Addendum)
Transfer to 3E29 without incident.  Bedside report to Suzette Battiest RN.

## 2015-12-07 NOTE — Clinical Documentation Improvement (Signed)
Critical Care  Please  Provide specificity regarding documented acute on chronic renal failure.   >  Acute renal failure,  > CKD and  stage  Other  Clinically Undetermined Note: Please include documentation of underlying condition(s) contributing/ causing renal failure  Supporting Information: Component     Latest Ref Rng 12/04/2015 12/05/2015 12/05/2015 12/05/2015         12:08 AM  3:45 AM 10:02 AM  BUN     6 - 20 mg/dL 97 (H) 85 (H) 88 (H) 83 (H)  Creatinine     0.61 - 1.24 mg/dL 2.43 (H) 2.00 (H) 2.07 (H) 2.11 (H)  EGFR (Non-African Amer.)     >60 mL/min 22 (L)  27 (L) 26 (L)   Component     Latest Ref Rng 12/05/2015 12/06/2015 12/07/2015         3:20 PM    BUN     6 - 20 mg/dL 78 (H) 74 (H) 61 (H)  Creatinine     0.61 - 1.24 mg/dL 1.89 (H) 1.79 (H) 1.46 (H)  EGFR (Non-African Amer.)     >60 mL/min 30 (L) 32 (L) 41 (L)   12/07/15: IVF resuscitation with blood products BP meds, lasix and aldactone  RENAL A:  Hyponatremia Hyperkalemia  Hypomag Acute on chronic renal failure   Please exercise your independent, professional judgment when responding. A specific answer is not anticipated or expected. Please update your documentation within the medical record to reflect your response to this query. Thank you  Thank You, Carpio [phone]

## 2015-12-07 NOTE — Progress Notes (Signed)
Initial Nutrition Assessment  DOCUMENTATION CODES:   Severe malnutrition in context of chronic illness, Underweight  INTERVENTION:    Ensure Enlive PO TID, each supplement provides 350 kcal and 20 grams of protein  NUTRITION DIAGNOSIS:   Malnutrition related to chronic illness as evidenced by energy intake < or equal to 75% for > or equal to 1 month, severe depletion of muscle mass.  GOAL:   Patient will meet greater than or equal to 90% of their needs  MONITOR:   PO intake, Supplement acceptance, Weight trends, I & O's  REASON FOR ASSESSMENT:   Malnutrition Screening Tool    ASSESSMENT:   82 y.o. man with past medical history of atrial fibrillation (on Coumadin), coronary artery disease s/p CABG, HFrEF, GERD, hemorrhoids, DM type 2, bladder cancer, GERD who presents to the emergency department with complaint of bright red blood per rectum.Admitted on 4/21 with with acute GIB.  Labs reviewed: sodium is low.  Patient reports that he has been eating poorly with a poor appetite for the past 6 months to a year. From discussion with patient, suspect intake PTA was < 75% of estimated needs. He used to weigh ~160 lbs years ago. Nutrition-Focused physical exam completed. Findings are mild-moderate fat depletion, severe muscle depletion, and moderate edema. He likes vanilla Ensure supplements and agreed to drink them between meals.  Diet Order:  Diet Heart Room service appropriate?: Yes; Fluid consistency:: Thin  Skin:  Reviewed, no issues  Last BM:  4/24  Height:   Ht Readings from Last 1 Encounters:  12/05/15 5\' 8"  (1.727 m)    Weight:   Wt Readings from Last 1 Encounters:  12/07/15 112 lb 7 oz (51 kg)    Ideal Body Weight:  70 kg  BMI:  Body mass index is 17.1 kg/(m^2).  Estimated Nutritional Needs:   Kcal:  1600-1800  Protein:  80-95 gm  Fluid:  1.6-1.8 L  EDUCATION NEEDS:   No education needs identified at this time  Molli Barrows, [place], [place],  [place] Pager [phone] After Hours Pager [phone]

## 2015-12-07 NOTE — Progress Notes (Signed)
PULMONARY / CRITICAL CARE MEDICINE   Name: [name] MRN: [id number] DOB: 1925/06/02    ADMISSION DATE:  12/04/2015 CONSULTATION DATE:  12/04/15  REFERRING MD:  ED physician  CHIEF COMPLAINT:  GI bleed  Brief Note : 82yo male admitted 12/04/15 with acute GIB . On coumadin with INR at 4.86 on admit.   HISTORY OF PRESENT ILLNESS:   [name] is a 82 y.o. man with past medical history of atrial fibrillation (on Coumadin), coronary artery disease s/p CABG, HFrEF, GERD, hemorrhoids, DM type 2, bladder cancer, GERD who presents to the emergency department with complaint of bright red blood per rectum.  Patient reports symptoms began last night (4/21) around 7pm when he noticed that he was bleeding all over the floor.  He has prior history of hemorrhoids but no history of GI bleed to this extent.  He describes the blood as bright red.  He reports a strange sensation in his rectum with bleeding but otherwise does not have any symptoms or pain.  Patient has recent history of a fall earlier in the month requiring an ED visit.  He reports some constipation about 2 weeks ago and difficulty urinating that has resolved.  He denies symptoms of feeling lightheaded, SOB, chest pain, nausea, vomiting, abdominal pain, diarrhea, fever or chills, weight loss.  He does report a decreased appetite.  He has been on Coumadin for at least "a couple years".  Patient had colonoscopy about 10 years unremarkable except for a hiatal hernia.  He denies EtOH use.  Reports he quit smoking in 1961.    In the ED, patient still with active bleeding.  He is receiving 3L normal saline, vitamin K, FFP x 2 units, and 1 unit of PRBC.  His hemoglobin is 11.2, PT/INR 44/4.86.  EKG shows new ST changes and T-wave inversions in the anterior leads.   Subjective/Overnight :  BP stable off Levo. Hgb dropped to 6.5 last night, got an additional unit PRBC.  Pt is alert sitting up in bed. No chest pain, sob, dizziness.   VITAL SIGNS: BP  106/49 mmHg  Pulse 90  Temp(Src) 98.7 F (37.1 C) (Oral)  Resp 13  Ht 5\' 8"  (1.727 m)  Wt 112 lb 7 oz (51 kg)  BMI 17.10 kg/m2  SpO2 100%  HEMODYNAMICS:    VENTILATOR SETTINGS:    INTAKE / OUTPUT: I/O last 3 completed shifts: In: 4795.9 [P.O.:2000; I.V.:2211.6; Blood:534.3; IV Piggyback:50] Out: 2885 [Urine:2885]  PHYSICAL EXAMINATION: General:  Elderly appearing, thin, caucasian male, calm  Neuro:  Alert and oriented, normal mentation, no focal deficits HEENT:  Left facial and periorbital ecchymosis, dry mucus membranes Cardiovascular:  RRR, normal s1, s2, intact distal pulses, warm extremities.  AICD pacemaker Right chest wall Lungs:  No increased work of breathing, CTAB Abdomen:  Soft, non-tender, non-distended, BS +  Musculoskeletal:  No deformities Skin:  Pale, intact, no rashes  LABS:  BMET  Recent Labs Lab 12/05/15 1002 12/05/15 1520 12/06/15 0518  NA 136 136 130*  K 4.7 4.6 5.3*  CL 107 109 105  CO2 15* 12* 17*  BUN 83* 78* 74*  CREATININE 2.11* 1.89* 1.79*  GLUCOSE 203* 112* 94    Electrolytes  Recent Labs Lab 12/04/15 2350  12/05/15 1002 12/05/15 1520 12/06/15 0518  CALCIUM 8.6*  < > 7.1* 6.8* 6.5*  MG 1.8  --  1.7  --  1.4*  PHOS  --   --  3.8  --  4.0  < > =  values in this interval not displayed.  CBC  Recent Labs Lab 12/06/15 0518 12/06/15 1944 12/06/15 2342  WBC 22.3* 20.4* 20.3*  HGB 7.5* 6.5* 8.0*  HCT 21.8* 18.1* 23.5*  PLT 110* 81* 74*    Coag's  Recent Labs Lab 12/05/15 1002 12/05/15 1843 12/06/15 1147  INR 2.04* 2.31* 2.05*    Sepsis Markers  Recent Labs Lab 12/05/15 1002 12/05/15 1520 12/06/15 1147  LATICACIDVEN 5.0* 6.9* 2.1*    ABG No results for input(s): PHART, PCO2ART, PO2ART in the last 168 hours.  Liver Enzymes  Recent Labs Lab 12/04/15 2350  AST 44*  ALT 45  ALKPHOS 134*  BILITOT 1.1  ALBUMIN 3.4*    Cardiac Enzymes  Recent Labs Lab 12/05/15 1002 12/05/15 1520 12/05/15 1843   TROPONINI 0.10* 0.13* 0.20*    Glucose  Recent Labs Lab 12/06/15 0940 12/06/15 1232 12/06/15 1628 12/06/15 2011 12/07/15 0036 12/07/15 0404  GLUCAP 91 213* 159* 141* 110* 112*    Imaging Dg Chest Port 1 View  12/06/2015  CLINICAL DATA:  CHF EXAM: PORTABLE CHEST 1 VIEW COMPARISON:  12/04/2015 chest radiograph. FINDINGS: Sternotomy wires appear aligned and intact. Stable configuration of 2 lead right subclavian pacemaker with lead tips overlying the coronary sinus and right ventricle. CABG clips overlie the mediastinum. Stable cardiomediastinal silhouette with normal heart size. No pneumothorax. No pleural effusion. Lungs appear clear, with no acute consolidative airspace disease and no pulmonary edema. IMPRESSION: No active disease. Electronically Signed   By: Ilona Sorrel M.D.   On: 12/06/2015 14:27   STUDIES:  4/22 CXR > no acute process  CULTURES: n/a  ANTIBIOTICS: n/a  SIGNIFICANT EVENTS: 4/22 admit to ICU for GI bleed while on coumadin and persistent bleeding, hypotension 4/22 rectal ulcer cauterized   LINES/TUBES: Foley catheter 4/22 PIV x 2  DISCUSSION: 82 y.o. man with past medical history of atrial fibrillation (on Coumadin), coronary artery disease s/p CABG, HFrEF, GERD, hemorrhoids, DM type 2, bladder cancer, GERD who presents to the emergency department with complaint of bright red blood per rectum.  Received 6  unit PRBC, 3/4  units FFP, vitamin K,  And IVF   ASSESSMENT / PLAN:  PULMONARY A: Hx of SOB with CHF P:   - Atrovent TID prn  CARDIOVASCULAR A:  EKG changes Shock likely hypovolemic/hemorrhagic 2/2 lower GI bleed Troponin leak - likely demand Pacemaker - currently V-paced in 60s occasionally 70s Hx atrial fibrillation Hx CAD Hx HTN Hx of HFrEF 4/22>lactate tr down  P:  - telemetry - hold BP meds, lasix and aldactone  - dig on hold for now - see GI section - BNP 2300 4/23 - holding anticoagulation, previously on warfarin. CHAD-VASc  4  RENAL A:   Hyponatremia Hyperkalemia  Hypomag Acute on chronic renal failure  P:   - BMET pending - I/O's, daily weight - Replace lytes  GASTROINTESTINAL A:   Lower GI Bleed likely 2/2 rectal ulcer 4/22>s/p 6 u PRBC , 3/4 FFP  4/22> flex sig with ulcer cauterization P:   - CBC Q12H - IVF resuscitation with blood products as needed  - Full liquid diet  HEMATOLOGIC A:   Lower GI Bleed  Coagulopathy 4/22 >hbg 11.2 >8.4  INR 4.8 P:  - daily INR  - zofran prn - SCDs  INFECTIOUS A:   Leukocytosis - likely stress reaction P:   -Monitor temp and wbc   ENDOCRINE A:   DM type 2    P:   - SSI - Lantus  20 units qhs  NEUROLOGIC A:   No acute issues P:   RASS goal: 0   FAMILY  - Updates: per patient, he lives with his wife who is currently out of town at [employer].  - Inter-disciplinary family meet or Palliative Care meeting due by:  day 7  Maryellen Pile, MD IMTS PGY-1 [phone]

## 2015-12-07 NOTE — Progress Notes (Signed)
  Echocardiogram 2D Echocardiogram has been performed.  Darlina Sicilian M 12/07/2015, 3:08 PM

## 2015-12-07 NOTE — Progress Notes (Signed)
Daily Rounding Note  12/07/2015, 9:04 AM  LOS: 2 days   SUBJECTIVE:       Did not sleep well.  Feels exhasuted.  Some pinkish rectal discharge and small brown smear of stool but no significant BPR in last 18 hours.  No abd pain.  On full liquid diet.   OBJECTIVE:         Vital signs in last 24 hours:    Temp:  [97.9 F (36.6 C)-99.5 F (37.5 C)] 98.7 F (37.1 C) (04/24 0805) Pulse Rate:  [88-93] 91 (04/24 0800) Resp:  [7-30] 17 (04/24 0800) BP: (72-117)/(43-68) 106/52 mmHg (04/24 0800) SpO2:  [97 %-100 %] 100 % (04/24 0800) Weight:  [51 kg (112 lb 7 oz)] 51 kg (112 lb 7 oz) (04/24 0500) Last BM Date: 12/06/15 Filed Weights   12/05/15 0554 12/06/15 0500 12/07/15 0500  Weight: 48.2 kg (106 lb 4.2 oz) 50.2 kg (110 lb 10.7 oz) 51 kg (112 lb 7 oz)   General: bruised left face.  Somewhat frail.   Heart: paced, regular Chest: Clear bil.  No cough or dyspnea Abdomen: soft, NT, ND, active BS  Extremities: no CCE Neuro/Psych:  Pleasant,  Calm, cooperative.  Fully alert and oriented.   Intake/Output from previous day: 04/23 0701 - 04/24 0700 In: 1883.8 [P.O.:1200; I.V.:348.8; Blood:335] Out: 2255 [Urine:2255]  Intake/Output this shift:    Lab Results:  Recent Labs  12/06/15 1944 12/06/15 2342 12/07/15 0529  WBC 20.4* 20.3* 19.5*  HGB 6.5* 8.0* 8.2*  HCT 18.1* 23.5* 23.6*  PLT 81* 74* 78*   BMET  Recent Labs  12/05/15 1520 12/06/15 0518 12/07/15 0529  NA 136 130* 132*  K 4.6 5.3* 5.1  CL 109 105 104  CO2 12* 17* 19*  GLUCOSE 112* 94 105*  BUN 78* 74* 61*  CREATININE 1.89* 1.79* 1.46*  CALCIUM 6.8* 6.5* 7.4*   LFT  Recent Labs  12/04/15 2350  PROT 6.1*  ALBUMIN 3.4*  AST 44*  ALT 45  ALKPHOS 134*  BILITOT 1.1   PT/INR  Recent Labs  12/06/15 1147 12/07/15 0529  LABPROT 23.0* 20.8*  INR 2.05* 1.79*   Hepatitis Panel No results for input(s): HEPBSAG, HCVAB, HEPAIGM, HEPBIGM in the  last 72 hours.  Studies/Results: Nm Gi Blood Loss  12/05/2015  CLINICAL DATA:  Bright red blood per rectum. Patient actively bleeding. Gastrointestinal bleeding K92.2 (ICD-10-CM) Gastrointestinal hemorrhage, unspecified gastritis, unspecified gastrointestinal hemorrhage type K92.2 (ICD-10-CM) EXAM: NUCLEAR MEDICINE GASTROINTESTINAL BLEEDING SCAN TECHNIQUE: Sequential abdominal images were obtained following intravenous administration of Tc-5m labeled red blood cells. RADIOPHARMACEUTICALS:  25.2 mCi Tc-55m in-vitro labeled red cells. COMPARISON:  CT, 11/27/2015 FINDINGS: There is a small elongated focus of uptake in the right central pelvis, adjacent to the right dome of the bladder and right common iliac artery, which becomes apparent approximately 20 minutes following the injection of radiotracer, fading later during the examination. Although somewhat equivocal, this suggests a gastro intestinal bleeding site from the sigmoid colon. There is no other evidence of a GI bleeding source. No other abnormal radiotracer accumulation. IMPRESSION: 1. Small area of abnormal radiotracer accumulation in right central pelvis suggesting a GI bleeding source from the sigmoid colon. Electronically Signed   By: Lajean Manes M.D.   On: 12/05/2015 14:51   Dg Chest Port 1 View  12/06/2015  CLINICAL DATA:  CHF EXAM: PORTABLE CHEST 1 VIEW COMPARISON:  12/04/2015 chest radiograph. FINDINGS: Sternotomy wires appear aligned and intact.  Stable configuration of 2 lead right subclavian pacemaker with lead tips overlying the coronary sinus and right ventricle. CABG clips overlie the mediastinum. Stable cardiomediastinal silhouette with normal heart size. No pneumothorax. No pleural effusion. Lungs appear clear, with no acute consolidative airspace disease and no pulmonary edema. IMPRESSION: No active disease. Electronically Signed   By: Ilona Sorrel M.D.   On: 12/06/2015 14:27   Scheduled Meds: . sodium chloride  10 mL/hr  Intravenous Once  . sodium chloride   Intravenous Once  . sodium chloride   Intravenous Once  . hydrocortisone sod succinate (SOLU-CORTEF) inj  50 mg Intravenous Q6H  . insulin aspart  1-3 Units Subcutaneous Q4H  . insulin glargine  20 Units Subcutaneous Q24H  . phytonadione (VITAMIN K) IV  5 mg Intravenous Once   Continuous Infusions: . sodium chloride    . phenylephrine (NEO-SYNEPHRINE) Adult infusion Stopped (12/06/15 1717)   PRN Meds:.sodium chloride, acetaminophen, ipratropium, ondansetron (ZOFRAN) IV  ASSESMENT:   *  Hematochezia.  In setting of chronic Coumadin and constipation. Stool filled rectum and colon on 4/14 CT scan when he had urinary retention.  4/22 RBC scan:  + for activity at region sigmoid.  4/23 flex sig: solitary rectal ulcer.  Epi injected and then cauterized.  Sigmoid diverticulosis.   *  ABL anemia.  Chronic anemia dates to 2014.   S/p PRBCs x 8, last completed on 4/23 at 2200.  Hgb improved.   *  Coagulopathy (INR 4.8) in setting of Coumadin for A fib.  S/p FFP x 6.  S/p vitamin K. Coags improved, not yet normal   *  Thrombocytopenia (dates back to 2014).  S/p platelets x 1.    *  AKI.  Improved.    PLAN   *  Needs vigorous bowel regimen to prevent future constipation/impaction related ulcers.  Adding higher dose Miralax BID for now, then BID, with titration as needed.  This was discussed with pt.  Advance to [facility] diet.     [name]  12/07/2015, 9:04 AM Pager: [phone]  GI ATTENDING  Interval history data reviewed. Patient personally seen and examined. Case discussed with Dr. Carlean Purl. Patient presented with massive lower GI bleeding secondary to solitary rectal ulcer (from constipation and impaction) with underlying visible vessel in the face of over anticoagulation. Treated successfully with endoscopic hemostatic therapy. Has had no bleeding since. Hemoglobin stable. No bowel movement today. We have recommended a bowel regimen to avoid future  episodes of constipation. In terms of warfarin therapy, would probably hold for week or 2 if planning to resume. If baby aspirin would provide cerebrovascular protection in the interim, that would be okay. Discussed with patient. Will sign off but available if needed for future problems or questions. Thanks  Docia Chuck. Geri Seminole., M.D. [facility] Division of Gastroenterology

## 2015-12-07 NOTE — Progress Notes (Signed)
Pt. Transferred to unit in alert and stable condition. No s/s of distress noted upon arrival. Pt. Denies pain at this time. Positioned in bed for comfort. Pt. Oriented to room, call light placed within reach. Pt. Placed on telemetry monitor and CCMD notified. RN will continue to monitor pt. For changes in condition. Kaymon Denomme, Katherine Roan

## 2015-12-08 ENCOUNTER — Inpatient Hospital Stay (HOSPITAL_COMMUNITY): Payer: Medicare Other

## 2015-12-08 ENCOUNTER — Encounter (HOSPITAL_COMMUNITY): Payer: Self-pay | Admitting: Physician Assistant

## 2015-12-08 DIAGNOSIS — I480 Paroxysmal atrial fibrillation: Secondary | ICD-10-CM

## 2015-12-08 DIAGNOSIS — N183 Chronic kidney disease, stage 3 unspecified: Secondary | ICD-10-CM | POA: Diagnosis present

## 2015-12-08 LAB — CBC
HEMATOCRIT: 23.3 % — AB (ref 39.0–52.0)
HEMOGLOBIN: 8 g/dL — AB (ref 13.0–17.0)
MCH: 29.5 pg (ref 26.0–34.0)
MCHC: 34.3 g/dL (ref 30.0–36.0)
MCV: 86 fL (ref 78.0–100.0)
Platelets: 106 10*3/uL — ABNORMAL LOW (ref 150–400)
RBC: 2.71 MIL/uL — ABNORMAL LOW (ref 4.22–5.81)
RDW: 15.2 % (ref 11.5–15.5)
WBC: 21.8 10*3/uL — ABNORMAL HIGH (ref 4.0–10.5)

## 2015-12-08 LAB — TYPE AND SCREEN
ABO/RH(D): O POS
ANTIBODY SCREEN: NEGATIVE
UNIT DIVISION: 0
UNIT DIVISION: 0
UNIT DIVISION: 0
UNIT DIVISION: 0
Unit division: 0
Unit division: 0
Unit division: 0
Unit division: 0
Unit division: 0
Unit division: 0

## 2015-12-08 LAB — BASIC METABOLIC PANEL
Anion gap: 6 (ref 5–15)
BUN: 54 mg/dL — ABNORMAL HIGH (ref 6–20)
CHLORIDE: 104 mmol/L (ref 101–111)
CO2: 19 mmol/L — ABNORMAL LOW (ref 22–32)
CREATININE: 1.36 mg/dL — AB (ref 0.61–1.24)
Calcium: 7.7 mg/dL — ABNORMAL LOW (ref 8.9–10.3)
GFR calc Af Amer: 52 mL/min — ABNORMAL LOW (ref >60)
GFR calc non Af Amer: 45 mL/min — ABNORMAL LOW (ref >60)
Glucose, Bld: 98 mg/dL (ref 65–99)
Potassium: 5 mmol/L (ref 3.5–5.1)
SODIUM: 129 mmol/L — AB (ref 135–145)

## 2015-12-08 LAB — PROTIME-INR
INR: 1.86 — ABNORMAL HIGH (ref 0.00–1.49)
Prothrombin Time: 21.4 seconds — ABNORMAL HIGH (ref 11.6–15.2)

## 2015-12-08 LAB — GLUCOSE, CAPILLARY
GLUCOSE-CAPILLARY: 151 mg/dL — AB (ref 65–99)
Glucose-Capillary: 98 mg/dL (ref 65–99)

## 2015-12-08 MED ORDER — METHYLPREDNISOLONE SODIUM SUCC 125 MG IJ SOLR
60.0000 mg | Freq: Once | INTRAMUSCULAR | Status: AC | PRN
  Administered 2015-12-08: 60 mg via INTRAVENOUS
  Filled 2015-12-08: qty 2

## 2015-12-08 MED ORDER — CARVEDILOL 3.125 MG PO TABS
3.1250 mg | ORAL_TABLET | Freq: Two times a day (BID) | ORAL | Status: DC
  Administered 2015-12-08 – 2015-12-09 (×2): 3.125 mg via ORAL
  Filled 2015-12-08 (×3): qty 1

## 2015-12-08 MED ORDER — VITAMIN B-12 1000 MCG PO TABS
1000.0000 ug | ORAL_TABLET | Freq: Every day | ORAL | Status: DC
  Administered 2015-12-08 – 2015-12-11 (×4): 1000 ug via ORAL
  Filled 2015-12-08 (×4): qty 1

## 2015-12-08 MED ORDER — DIGOXIN 125 MCG PO TABS
0.0625 mg | ORAL_TABLET | Freq: Every day | ORAL | Status: DC
  Administered 2015-12-08: 0.625 mg via ORAL
  Administered 2015-12-09 – 2015-12-11 (×3): 0.0625 mg via ORAL
  Filled 2015-12-08 (×4): qty 1

## 2015-12-08 MED ORDER — PRAVASTATIN SODIUM 40 MG PO TABS
40.0000 mg | ORAL_TABLET | Freq: Every day | ORAL | Status: DC
  Administered 2015-12-08 – 2015-12-11 (×4): 40 mg via ORAL
  Filled 2015-12-08 (×4): qty 1

## 2015-12-08 MED ORDER — HEPARIN SODIUM (PORCINE) 5000 UNIT/ML IJ SOLN
5000.0000 [IU] | Freq: Three times a day (TID) | INTRAMUSCULAR | Status: DC
  Administered 2015-12-09 – 2015-12-11 (×8): 5000 [IU] via SUBCUTANEOUS
  Filled 2015-12-08 (×7): qty 1

## 2015-12-08 MED ORDER — ASPIRIN 81 MG PO CHEW
81.0000 mg | CHEWABLE_TABLET | Freq: Every day | ORAL | Status: DC
  Administered 2015-12-08 – 2015-12-11 (×4): 81 mg via ORAL
  Filled 2015-12-08 (×4): qty 1

## 2015-12-08 MED ORDER — ZOLPIDEM TARTRATE 5 MG PO TABS
5.0000 mg | ORAL_TABLET | Freq: Every evening | ORAL | Status: DC | PRN
  Administered 2015-12-08 – 2015-12-10 (×2): 5 mg via ORAL
  Filled 2015-12-08 (×2): qty 1

## 2015-12-08 MED ORDER — FUROSEMIDE 10 MG/ML IJ SOLN
40.0000 mg | Freq: Two times a day (BID) | INTRAMUSCULAR | Status: DC
Start: 2015-12-08 — End: 2015-12-09
  Administered 2015-12-08 – 2015-12-09 (×3): 40 mg via INTRAVENOUS
  Filled 2015-12-08 (×3): qty 4

## 2015-12-08 MED ORDER — CEFTRIAXONE SODIUM 1 G IJ SOLR
1.0000 g | INTRAMUSCULAR | Status: DC
  Administered 2015-12-08 – 2015-12-11 (×4): 1 g via INTRAVENOUS
  Filled 2015-12-08 (×5): qty 10

## 2015-12-08 MED ORDER — DOCUSATE SODIUM 100 MG PO CAPS
200.0000 mg | ORAL_CAPSULE | Freq: Two times a day (BID) | ORAL | Status: DC
Start: 2015-12-08 — End: 2015-12-11
  Administered 2015-12-08 – 2015-12-11 (×7): 200 mg via ORAL
  Filled 2015-12-08 (×7): qty 2

## 2015-12-08 MED ORDER — ALLOPURINOL 300 MG PO TABS
300.0000 mg | ORAL_TABLET | Freq: Every day | ORAL | Status: DC
  Administered 2015-12-08 – 2015-12-11 (×4): 300 mg via ORAL
  Filled 2015-12-08 (×4): qty 1

## 2015-12-08 MED ORDER — DIPHENHYDRAMINE HCL 50 MG/ML IJ SOLN
25.0000 mg | Freq: Once | INTRAMUSCULAR | Status: AC | PRN
  Administered 2015-12-08: 25 mg via INTRAVENOUS
  Filled 2015-12-08: qty 1

## 2015-12-08 NOTE — Progress Notes (Signed)
Rehab Admissions Coordinator Note:  Patient was screened by Cleatrice Burke for appropriateness for an Inpatient Acute Rehab Consult per PT recommendation. At this time, we are recommending await further progress with therapy before detrmining rehab venue needs. . I will follow.  Cleatrice Burke 12/08/2015, 6:54 PM  I can be reached at [phone].

## 2015-12-08 NOTE — Evaluation (Addendum)
Physical Therapy Evaluation Patient Details Name: [name] MRN: [id number] DOB: 08-26-1924 Today's Date: 12/08/2015   History of Present Illness  [name] is a 82 y.o. man with past medical history of atrial fibrillation (on Coumadin), coronary artery disease s/p CABG, DM type 2, bladder cancer who presents to the emergency department with complaint of bright red blood per rectum. Required 7+ units of blood. GI performed cautery RN reports pt has not slept in 3 nights with some confusion resulting  Clinical Impression  Pt admitted with above diagnosis. Patient was highly independent PTA and is hopeful to not need rehab stay prior to discharge home. Pt currently with functional limitations due to the deficits listed below (see PT Problem List).  Pt will benefit from skilled PT to increase their independence and safety with mobility to allow discharge to the venue listed below.       Follow Up Recommendations CIR      Equipment Recommendations   (TBA)    Recommendations for Other Services OT consult     Precautions / Restrictions Precautions Precautions: Fall      Mobility  Bed Mobility Overal bed mobility: Needs Assistance Bed Mobility: Sidelying to Sit;Sit to Supine   Sidelying to sit: Min guard   Sit to supine: Supervision   General bed mobility comments: minguard due to incr effort and reports of dizziness; after returned to bed, pt felt he needed to have BM and assisted to [facility]  Transfers Overall transfer level: Needs assistance Equipment used: None Transfers: Sit to/from [facility] Sit to Stand: Min guard Stand pivot transfers: Min assist       General transfer comment: pt with difficulty sequencing and aligning himself with [facility]  Ambulation/Gait Ambulation/Gait assistance: Min assist Ambulation Distance (Feet): 25 Feet Assistive device:  (holding IV pole) Gait Pattern/deviations: Step-through pattern;Decreased stride length   Gait  velocity interpretation: Below normal speed for age/gender General Gait Details: steady  Science writer    Modified Rankin (Stroke Patients Only)       Balance Overall balance assessment: Needs assistance;History of Falls Sitting-balance support: No upper extremity supported;Feet unsupported Sitting balance-Leahy Scale: Good     Standing balance support: No upper extremity supported Standing balance-Leahy Scale: Poor                               Pertinent Vitals/Pain BP after supine to sit 109/56, HR 89, SaO2 100% on room air After walking, HR 93, SaO2 97% (dinamap had been removed and f/u BP not possible)  Pain Assessment: No/denies pain    Home Living Family/patient expects to be discharged to:: Private residence Living Arrangements: Spouse/significant other Available Help at Discharge: Family;Available 24 hours/day ("my wife is 24 yrs younger than me") Type of Home: House         Home Equipment: None Additional Comments: Pt provides different answers to same question so home setup unknown    Prior Function Level of Independence: Independent         Comments: per pt     Hand Dominance        Extremity/Trunk Assessment   Upper Extremity Assessment: Generalized weakness           Lower Extremity Assessment: Generalized weakness      Cervical / Trunk Assessment: Normal  Communication   Communication: No difficulties  Cognition Arousal/Alertness:  Awake/alert Behavior During Therapy: WFL for tasks assessed/performed Overall Cognitive Status: Impaired/Different from baseline Area of Impairment: Memory;Following commands;Safety/judgement;Problem solving     Memory: Decreased short-term memory Following Commands: Follows one step commands with increased time Safety/Judgement: Decreased awareness of safety;Decreased awareness of deficits   Problem Solving: Slow processing;Decreased initiation;Requires  verbal cues;Requires tactile cues      General Comments General comments (skin integrity, edema, etc.): Patient reported feelling exhausted after short walk    Exercises        Assessment/Plan    PT Assessment Patient needs continued PT services  PT Diagnosis Generalized weakness;Difficulty walking   PT Problem List Decreased strength;Decreased activity tolerance;Decreased balance;Decreased mobility;Decreased cognition;Decreased knowledge of use of DME;Decreased safety awareness  PT Treatment Interventions DME instruction;Gait training;Stair training;Functional mobility training;Therapeutic activities;Patient/family education   PT Goals (Current goals can be found in the Care Plan section) Acute Rehab PT Goals Patient Stated Goal: return straight home if able PT Goal Formulation: With patient Time For Goal Achievement: 12/15/15 Potential to Achieve Goals: Good    Frequency Min 3X/week   Barriers to discharge        Co-evaluation               End of Session Equipment Utilized During Treatment: Gait belt Activity Tolerance: Patient limited by fatigue Patient left: with nursing/sitter in room (on [facility]) Nurse Communication: Mobility status         Time: [id number] PT Time Calculation (min) (ACUTE ONLY): 35 min   Charges:   PT Evaluation $PT Eval Low Complexity: 1 Procedure PT Treatments $Gait Training: 8-22 mins   PT G Codes:        Arleatha Philipps 01-01-2016, 4:50 PM  Pager [phone]

## 2015-12-08 NOTE — Progress Notes (Signed)
PROGRESS NOTE                                                                                                                                                                                                             Patient Demographics:    [name], is a 82 y.o. male, DOB - Aug 12, 1925, OX:[id number]  Admit date - 12/04/2015   Admitting Physician Raylene Miyamoto, MD  Outpatient Primary MD for the patient is Donnajean Lopes, MD  LOS - 3  Outpatient Specialists: [facility], Cardiologist Dr. Crissie Sickles  Chief Complaint  Patient presents with  . GI Bleeding       Brief Narrative  Pleasant 82 year old male with history of atrial fibrillation on Coumadin, CAD status post CABG, CHF, type 2 diabetes mellitus, bladder cancer who was admitted initially for bright red blood per rectum causing anemia requiring transfusion and ICU admission. He was seen by GI. He was found to have a supratherapeutic INR requiring FFP and vitamin K, underwent flexible sigmoidoscopy showing a small rectal ulcer which was bleeding likely caused by constipation.   He also developed acute renal failure, massive edema in upper extremities and generalized weakness. Transferred to my care on 12/08/2015 from ICU.     Subjective:    Barbaraann Rondo today has, No headache, No chest pain, No abdominal pain - No Nausea, No new weakness tingling or numbness, No Cough - SOB. Feels weak   Assessment  & Plan :     1. Acute lower GI bleed associated anemia with shock in the setting of Coumadin and high INR. INR was reversed, Coumadin on hold, received 6 units of packed RBCs in the ICU. H&H now stable. Seen by GI underwent tagged RBC scan and flexible sigmoidoscopy showing rectal ulcer with bleed which was cauterized, ulcer was likely caused by constipation.  Per GI hold Coumadin for 2 days, 81 mg of aspirin, monitor H&H avoid constipation. Shock has  resolved.   2. Leukocytosis due to Escherichia coli UTI. Start on Rocephin and discontinue Foley, monitor.  3. Ischemic cardiomyopathy with acute on chronic systolic heart failure EF 30-35%. Place on IV Lasix, has massive edema in upper extremities, elevate extremities, salt and fluid restriction, monitor weight and intake and output, trace on low-dose Coreg, no ACE/ARB due to renal failure. We will involve cardiology.  4.  Chronic atrial fibrillation, complete heart block - [place] vasc 2 score of 4. Coumadin on hold for 2 weeks, place on low-dose aspirin 81 mg daily then, place on low-dose Coreg, involve cardiology. Has history of AICD device infection in the past. Primary cardiology is Dr. Crissie Sickles. Resume home dose digoxin.  5. CAD. Status post CABG. Chest pain-free, mild troponin bump likely demand ischemia from anemia, please on low-dose aspirin, continue statin, low-dose beta blocker. Cardiology to be involved.  6. Dyslipidemia. Replaced on statin.  7. Essential hypertension. ACE inhibitor on hold due to ARF, low-dose beta blocker and monitor.  8. ARF. Initially due to shock from anemia, now evidence of fluid overload, cautiously resume Lasix and monitor. Discontinue Foley.  9. History of gout. On allopurinol.  10. Coumadin induced coagulopathy and supra therapeutic INR. Coumadin has been reversed, on hold for 2 weeks.  11. New wall motion abnormalities on echogram. Likely due to biventricular pacemaker. Will get cardiology input.   Code Status : Full  Family Communication  : None present  Disposition Plan  : Stay patient  Barriers For Discharge : UTI, leukocytosis, ARF and CHF  Consults  :  Pulmonary critical care, GI, Cards  Procedures  :   4/22 RBC scan: + for activity at region sigmoid.  4/23 flex sig: solitary rectal ulcer. Epi injected and then cauterized. Sigmoid diverticulosis.   TTE -   - Left ventricle: The cavity size was normal. Wall thickness was  normal. Systolic function was moderately to severely reduced. The estimated ejection fraction was in the range of 30% to 35%. There is akinesis of the anterior, inferolateral, and apical myocardium. The study is not technically sufficient to allow evaluation of LV diastolic function. - Aortic valve: Valve mobility was restricted. There was mild to moderate stenosis. There was trivial regurgitation. Valve area (VTI): 1.48 cm^2. Valve area (Vmax): 1.41 cm^2. Valve area (Vmean): 1.34 cm^2. - Mitral valve: There was mild regurgitation. - Left atrium: The atrium was mildly dilated. - Tricuspid valve: There was moderate regurgitation. - Pulmonary arteries: Systolic pressure was moderately to severely increased. PA peak pressure: 64 mm Hg (S).  Impressions:  Akinesis of the inferolateral wall, anterior wall and apex; overall moderate to severe reduction in LV function; calcified aortic valve with mild to moderate AS by continuity equation; mild MR; mild LAE; moderate TR; moderate to severe pulmonary hypertension.   DVT Prophylaxis  :   - SCDs, Start heparin from 12/09/2015  Lab Results  Component Value Date   PLT 106* 12/08/2015    Antibiotics  :     Anti-infectives    Start     Dose/Rate Route Frequency Ordered Stop   12/08/15 1015  cefTRIAXone (ROCEPHIN) 1 g in dextrose 5 % 50 mL IVPB     1 g 100 mL/hr over 30 Minutes Intravenous Every 24 hours 12/08/15 1002          Objective:   Filed Vitals:   12/07/15 2051 12/07/15 2341 12/08/15 0450 12/08/15 0740  BP: 119/51 111/54 128/62 123/56  Pulse: 89 90 89 94  Temp: 98.2 F (36.8 C) 98.8 F (37.1 C) 98.3 F (36.8 C) 98.2 F (36.8 C)  TempSrc: Oral Oral Oral Oral  Resp: 16 18 18 18   Height: 5\' 8"  (1.727 m)     Weight: 53.661 kg (118 lb 4.8 oz)  54.522 kg (120 lb 3.2 oz)   SpO2: 99% 97% 100% 99%    Wt Readings from Last 3 Encounters:  12/08/15  54.522 kg (120 lb 3.2 oz)  04/07/15 58.877 kg (129 lb 12.8 oz)  06/29/14 57.7 kg (127 lb  3.3 oz)     Intake/Output Summary (Last 24 hours) at 12/08/15 1004 Last data filed at 12/08/15 0913  Gross per 24 hour  Intake    730 ml  Output   1500 ml  Net   -770 ml     Physical Exam  Awake Alert, Oriented X 3, No new F.N deficits, Normal affect [place].AT,PERRAL, L . facial bruise and scalp laceration which has been sutured, Supple Neck,No JVD, No cervical lymphadenopathy appriciated.  Symmetrical Chest wall movement, Good air movement bilaterally, CTAB RRR,No Gallops,Rubs or new Murmurs, No Parasternal Heave +ve B.Sounds, Abd Soft, No tenderness, No organomegaly appriciated, No rebound - guarding or rigidity. No Cyanosis, Clubbing , No new Rash or bruise    2+ edema right more than left, trace edema in lower extremities   Data Review:    CBC  Recent Labs Lab 12/04/15 2350  12/05/15 0055  12/05/15 1002  12/06/15 1944 12/06/15 2342 12/07/15 0529 12/07/15 1812 12/08/15 0425  WBC 14.3*  --  15.8*  < > 13.6*  < > 20.4* 20.3* 19.5* 18.9* 21.8*  HGB 13.1  < > 11.2*  < > 6.9*  < > 6.5* 8.0* 8.2* 7.8* 8.0*  HCT 38.0*  < > 31.3*  < > 20.7*  < > 18.1* 23.5* 23.6* 22.8* 23.3*  PLT 221  --  198  < > 81*  < > 81* 74* 78* 89* 106*  MCV 95.0  --  96.3  < > 93.2  < > 85.4 86.4 86.1 85.7 86.0  MCH 32.8  --  34.5*  < > 31.1  < > 30.7 29.4 29.9 29.3 29.5  MCHC 34.5  --  35.8  < > 33.3  < > 35.9 34.0 34.7 34.2 34.3  RDW 14.2  --  14.5  < > 14.5  < > 14.9 14.2 14.9 14.8 15.2  LYMPHSABS 2.2  --  1.4  --  0.5*  --   --   --   --   --   --   MONOABS 1.2*  --  1.1*  --  1.2*  --   --   --   --   --   --   EOSABS 0.2  --  0.2  --  0.0  --   --   --   --   --   --   BASOSABS 0.1  --  0.0  --  0.0  --   --   --   --   --   --   < > = values in this interval not displayed.  Chemistries   Recent Labs Lab 12/04/15 2350  12/05/15 0345 12/05/15 1002 12/05/15 1520 12/06/15 0518 12/07/15 0529  NA 124*  < > 129* 136 136 130* 132*  K 5.2*  < > 5.5* 4.7 4.6 5.3* 5.1  CL 89*  < > 100* 107  109 105 104  CO2 20*  --  17* 15* 12* 17* 19*  GLUCOSE 288*  < > 269* 203* 112* 94 105*  BUN 97*  < > 88* 83* 78* 74* 61*  CREATININE 2.43*  < > 2.07* 2.11* 1.89* 1.79* 1.46*  CALCIUM 8.6*  --  7.2* 7.1* 6.8* 6.5* 7.4*  MG 1.8  --   --  1.7  --  1.4*  --   AST 44*  --   --   --   --   --   --  ALT 45  --   --   --   --   --   --   ALKPHOS 134*  --   --   --   --   --   --   BILITOT 1.1  --   --   --   --   --   --   < > = values in this interval not displayed. ------------------------------------------------------------------------------------------------------------------ No results for input(s): CHOL, HDL, LDLCALC, TRIG, CHOLHDL, LDLDIRECT in the last 72 hours.  No results found for: HGBA1C ------------------------------------------------------------------------------------------------------------------ No results for input(s): TSH, T4TOTAL, T3FREE, THYROIDAB in the last 72 hours.  Invalid input(s): FREET3 ------------------------------------------------------------------------------------------------------------------ No results for input(s): VITAMINB12, FOLATE, FERRITIN, TIBC, IRON, RETICCTPCT in the last 72 hours.  Coagulation profile  Recent Labs Lab 12/05/15 1002 12/05/15 1843 12/06/15 1147 12/07/15 0529 12/08/15 0425  INR 2.04* 2.31* 2.05* 1.79* 1.86*    No results for input(s): DDIMER in the last 72 hours.  Cardiac Enzymes  Recent Labs Lab 12/05/15 1002 12/05/15 1520 12/05/15 1843  TROPONINI 0.10* 0.13* 0.20*   ------------------------------------------------------------------------------------------------------------------    Component Value Date/Time   BNP 2311.3* 12/06/2015 0518    Inpatient Medications  Scheduled Meds: . allopurinol  300 mg Oral Daily  . aspirin  81 mg Oral Daily  . cefTRIAXone (ROCEPHIN)  IV  1 g Intravenous Q24H  . digoxin  0.0625 mg Oral Daily  . docusate sodium  200 mg Oral BID  . feeding supplement (ENSURE ENLIVE)  237 mL  Oral TID BM  . furosemide  40 mg Intravenous BID  . insulin aspart  0-5 Units Subcutaneous QHS  . insulin aspart  0-9 Units Subcutaneous TID WC  . insulin aspart  0-9 Units Subcutaneous TID WC  . polyethylene glycol  17 g Oral Daily  . pravastatin  40 mg Oral q1800  . vitamin B-12  1,000 mcg Oral Daily   Continuous Infusions:  PRN Meds:.acetaminophen, ipratropium, ondansetron (ZOFRAN) IV  Micro Results Recent Results (from the past 240 hour(s))  Urine culture     Status: Abnormal   Collection Time: 12/05/15 12:55 AM  Result Value Ref Range Status   Specimen Description URINE, CATHETERIZED  Final   Special Requests NONE  Final   Culture >=100,000 COLONIES/mL ESCHERICHIA COLI (A)  Final   Report Status 12/07/2015 FINAL  Final   Organism ID, Bacteria ESCHERICHIA COLI (A)  Final      Susceptibility   Escherichia coli - MIC*    AMPICILLIN >=32 RESISTANT Resistant     CEFAZOLIN <=4 SENSITIVE Sensitive     CEFTRIAXONE <=1 SENSITIVE Sensitive     CIPROFLOXACIN <=0.25 SENSITIVE Sensitive     GENTAMICIN <=1 SENSITIVE Sensitive     IMIPENEM <=0.25 SENSITIVE Sensitive     NITROFURANTOIN <=16 SENSITIVE Sensitive     TRIMETH/SULFA <=20 SENSITIVE Sensitive     AMPICILLIN/SULBACTAM >=32 RESISTANT Resistant     PIP/TAZO <=4 SENSITIVE Sensitive     * >=100,000 COLONIES/mL ESCHERICHIA COLI  MRSA PCR Screening     Status: None   Collection Time: 12/05/15  5:45 AM  Result Value Ref Range Status   MRSA by PCR NEGATIVE NEGATIVE Final    Comment:        The GeneXpert MRSA Assay (FDA approved for NASAL specimens only), is one component of a comprehensive MRSA colonization surveillance program. It is not intended to diagnose MRSA infection nor to guide or monitor treatment for MRSA infections.     Radiology Reports Ct Abdomen Pelvis  Wo Contrast  11/27/2015  CLINICAL DATA:  Acute onset of urinary retention and constipation. Rectal burning. Initial encounter. EXAM: CT ABDOMEN AND PELVIS  WITHOUT CONTRAST TECHNIQUE: Multidetector CT imaging of the abdomen and pelvis was performed following the standard protocol without IV contrast. COMPARISON:  CT of the abdomen and pelvis from 10/15/2015 FINDINGS: A trace right-sided pleural effusion is noted, with mild right basilar atelectasis. Pacemaker leads are partially imaged. The liver and spleen are unremarkable in appearance. The gallbladder is within normal limits. The pancreas and adrenal glands are unremarkable. Scattered right renal cysts are seen, one of which demonstrates some peripheral calcification. Nonspecific perinephric stranding is noted bilaterally. There is no evidence of hydronephrosis. No renal or ureteral stones are identified. No free fluid is identified. The small bowel is unremarkable in appearance. The stomach is within normal limits. No acute vascular abnormalities are seen. Scattered calcification is seen along the abdominal aorta and its branches, particularly prominent along the common iliac arteries bilaterally. The appendix is normal in caliber, without evidence of appendicitis. Scattered diverticulosis is noted along the descending and sigmoid colon, without evidence of diverticulitis. The rectum is largely filled with stool, measuring 7.0 cm in transverse dimension. The bladder is mildly distended, with a Foley catheter in place. The prostate remains normal in size. No inguinal lymphadenopathy is seen. No acute osseous abnormalities are identified. Chronic bilateral pars defects are seen at L5. IMPRESSION: 1. Rectum largely filled with stool, measuring 7.0 cm in transverse dimension, raising concern for mild fecal impaction. Would correlate with the patient's symptoms. The remainder of the colon is largely filled with stool. 2. Scattered diverticulosis along the descending and sigmoid colon, without evidence of diverticulitis. 3. Scattered right renal cysts, one of which demonstrates some peripheral calcification. Nonspecific  bilateral perinephric stranding is grossly stable. 4. Previously noted ascites has resolved. 5. Residual trace right pleural effusion, with mild right basilar atelectasis. 6. Scattered calcification along the abdominal aorta and its branches, particularly prominent along the common iliac arteries bilaterally. 7. Chronic bilateral pars defects at L5, without evidence of anterolisthesis. Electronically Signed   By: Garald Balding M.D.   On: 11/27/2015 21:56   Ct Head Wo Contrast  11/24/2015  CLINICAL DATA:  82 year old male who fell while walking to the male box. Struck his left head and face on concrete. Loss of consciousness. Headache. On blood thinners. Initial encounter. EXAM: CT HEAD WITHOUT CONTRAST CT MAXILLOFACIAL WITHOUT CONTRAST TECHNIQUE: Multidetector CT imaging of the head and maxillofacial structures were performed using the standard protocol without intravenous contrast. Multiplanar CT image reconstructions of the maxillofacial structures were also generated. COMPARISON:  None available. FINDINGS: CT HEAD FINDINGS Face findings are described below. No scalp hematoma identified. Calvarium intact. Cerebral volume loss. Patchy bilateral cerebral white matter hypodensity. Small chronic appearing lacunar infarcts in the cerebellum and right thalamus. Small area of cortical encephalomalacia in the right MCA territory of the superior right frontal gyrus series 2, image 25. No midline shift, mass effect, or evidence of intracranial mass lesion. No acute intracranial hemorrhage identified. No areas of acute cortically based infarction identified. CT MAXILLOFACIAL FINDINGS Negative visualized nonco Calcified atherosclerosis at the skull base. ntrast brain parenchyma aside from cerebral volume loss. Calcified atherosclerosis at the skull base. No scalp hematoma identified. Postoperative changes to both globes. No acute orbit soft tissue abnormality identified. There is a left face subcutaneous contusion or  hematoma on series 4, image 41, at an just below the zygoma. There is mild abnormal retro maxillary  soft tissue density (series 4, image 53). Other visualized noncontrast deep soft tissue spaces of the face remain normal. The mandible remains intact. There is a mildly comminuted fracture through the posterior superior wall of the left maxillary sinus (series 5, image 57). Hyperdensity throughout the left maxillary sinus therefore may be hemorrhage. The other paranasal sinuses remain clear. There is a nondisplaced fracture of the left zygomatic arch (image 54). There is a mildly comminuted fracture of the lateral wall of the left orbit. The left orbital floor appears to remain intact. No nasal bone fracture identified. No contralateral right face fractures identified. Advanced degenerative changes in the visualized cervical spine with multilevel spondylolisthesis. IMPRESSION: 1. Mildly comminuted fractures of the left maxillary sinus posterior wall, left orbit lateral wall, left zygomatic arch. Overlying soft tissue hematoma. Hemorrhage in the left maxillary sinus. 2. No other acute facial fracture. 3. Chronic small and medium-sized vessel ischemia in the brain with no acute intracranial abnormality. Electronically Signed   By: Genevie Ann M.D.   On: 11/24/2015 14:52   Nm Gi Blood Loss  12/05/2015  CLINICAL DATA:  Bright red blood per rectum. Patient actively bleeding. Gastrointestinal bleeding K92.2 (ICD-10-CM) Gastrointestinal hemorrhage, unspecified gastritis, unspecified gastrointestinal hemorrhage type K92.2 (ICD-10-CM) EXAM: NUCLEAR MEDICINE GASTROINTESTINAL BLEEDING SCAN TECHNIQUE: Sequential abdominal images were obtained following intravenous administration of Tc-41m labeled red blood cells. RADIOPHARMACEUTICALS:  25.2 mCi Tc-58m in-vitro labeled red cells. COMPARISON:  CT, 11/27/2015 FINDINGS: There is a small elongated focus of uptake in the right central pelvis, adjacent to the right dome of the bladder  and right common iliac artery, which becomes apparent approximately 20 minutes following the injection of radiotracer, fading later during the examination. Although somewhat equivocal, this suggests a gastro intestinal bleeding site from the sigmoid colon. There is no other evidence of a GI bleeding source. No other abnormal radiotracer accumulation. IMPRESSION: 1. Small area of abnormal radiotracer accumulation in right central pelvis suggesting a GI bleeding source from the sigmoid colon. Electronically Signed   By: Lajean Manes M.D.   On: 12/05/2015 14:51   Dg Chest Port 1 View  12/08/2015  CLINICAL DATA:  Shortness of breath EXAM: PORTABLE CHEST 1 VIEW COMPARISON:  12/06/2015 FINDINGS: Chronic cardiomegaly. Biventricular pacer from the right with leads in stable position. Status post CABG. New hazy appearance at the right base with costophrenic sulcus blunting. No pneumothorax. Remote mid left clavicle fracture. IMPRESSION: New small right pleural effusion. Electronically Signed   By: Monte Fantasia M.D.   On: 12/08/2015 06:31   Dg Chest Port 1 View  12/06/2015  CLINICAL DATA:  CHF EXAM: PORTABLE CHEST 1 VIEW COMPARISON:  12/04/2015 chest radiograph. FINDINGS: Sternotomy wires appear aligned and intact. Stable configuration of 2 lead right subclavian pacemaker with lead tips overlying the coronary sinus and right ventricle. CABG clips overlie the mediastinum. Stable cardiomediastinal silhouette with normal heart size. No pneumothorax. No pleural effusion. Lungs appear clear, with no acute consolidative airspace disease and no pulmonary edema. IMPRESSION: No active disease. Electronically Signed   By: Ilona Sorrel M.D.   On: 12/06/2015 14:27   Dg Chest Port 1 View  12/04/2015  CLINICAL DATA:  Altered mental status.  GI bleed. EXAM: PORTABLE CHEST 1 VIEW COMPARISON:  11/27/2015 FINDINGS: Cardiac pacemaker. Postoperative changes in the mediastinum. Shallow inspiration. Heart size and pulmonary  vascularity are normal. No focal airspace disease or consolidation in the lungs. No blunting of costophrenic angles. No pneumothorax. Old left rib fractures. Old left clavicular fracture. IMPRESSION:  No active disease. Electronically Signed   By: Lucienne Capers M.D.   On: 12/04/2015 23:57   Dg Abd Acute W/chest  11/27/2015  CLINICAL DATA:  Burning sensations in the rectal region and constipation with urine blockage. EXAM: DG ABDOMEN ACUTE W/ 1V CHEST COMPARISON:  Previous examinations. FINDINGS: The heart remains grossly normal in size. Stable post CABG changes and right subclavian pacemaker leads. Clear lungs with normal vascularity. Normal bowel gas pattern without free peritoneal air. Prominent stool throughout the colon. Mild scoliosis. Mild lumbar spine degenerative changes. Atheromatous arterial calcifications. IMPRESSION: No acute abnormality.  Prominent stool. Electronically Signed   By: Claudie Revering M.D.   On: 11/27/2015 18:22   Ct Maxillofacial Wo Cm  11/24/2015  CLINICAL DATA:  82 year old male who fell while walking to the male box. Struck his left head and face on concrete. Loss of consciousness. Headache. On blood thinners. Initial encounter. EXAM: CT HEAD WITHOUT CONTRAST CT MAXILLOFACIAL WITHOUT CONTRAST TECHNIQUE: Multidetector CT imaging of the head and maxillofacial structures were performed using the standard protocol without intravenous contrast. Multiplanar CT image reconstructions of the maxillofacial structures were also generated. COMPARISON:  None available. FINDINGS: CT HEAD FINDINGS Face findings are described below. No scalp hematoma identified. Calvarium intact. Cerebral volume loss. Patchy bilateral cerebral white matter hypodensity. Small chronic appearing lacunar infarcts in the cerebellum and right thalamus. Small area of cortical encephalomalacia in the right MCA territory of the superior right frontal gyrus series 2, image 25. No midline shift, mass effect, or evidence of  intracranial mass lesion. No acute intracranial hemorrhage identified. No areas of acute cortically based infarction identified. CT MAXILLOFACIAL FINDINGS Negative visualized nonco Calcified atherosclerosis at the skull base. ntrast brain parenchyma aside from cerebral volume loss. Calcified atherosclerosis at the skull base. No scalp hematoma identified. Postoperative changes to both globes. No acute orbit soft tissue abnormality identified. There is a left face subcutaneous contusion or hematoma on series 4, image 41, at an just below the zygoma. There is mild abnormal retro maxillary soft tissue density (series 4, image 53). Other visualized noncontrast deep soft tissue spaces of the face remain normal. The mandible remains intact. There is a mildly comminuted fracture through the posterior superior wall of the left maxillary sinus (series 5, image 57). Hyperdensity throughout the left maxillary sinus therefore may be hemorrhage. The other paranasal sinuses remain clear. There is a nondisplaced fracture of the left zygomatic arch (image 54). There is a mildly comminuted fracture of the lateral wall of the left orbit. The left orbital floor appears to remain intact. No nasal bone fracture identified. No contralateral right face fractures identified. Advanced degenerative changes in the visualized cervical spine with multilevel spondylolisthesis. IMPRESSION: 1. Mildly comminuted fractures of the left maxillary sinus posterior wall, left orbit lateral wall, left zygomatic arch. Overlying soft tissue hematoma. Hemorrhage in the left maxillary sinus. 2. No other acute facial fracture. 3. Chronic small and medium-sized vessel ischemia in the brain with no acute intracranial abnormality. Electronically Signed   By: Genevie Ann M.D.   On: 11/24/2015 14:52    Time Spent in minutes  30   SINGH,PRASHANT K M.D on 12/08/2015 at 10:04 AM  Between 7am to 7pm - Pager - [phone]  After 7pm go to [email or web address] - password  [facility]  [facility] -  Office  [phone]

## 2015-12-08 NOTE — Consult Note (Signed)
CARDIOLOGY CONSULT NOTE   Patient ID: [name] MRN: [id number] DOB/AGE: 1925/01/04 82 y.o.  Admit date: 12/04/2015  Primary Physician   Donnajean Lopes, MD Primary Cardiologist   Dr Lovena Le Reason for Consultation   Acute on chronic systolic CHF  [id number] [name] is a 82 y.o. year old male with a history of ICM w/ EF 30%, CHB s/p MDT PPM, CABG 2004, CKD III, S-CHF, HTN, HLD, PAF on coumadin, GERD, DM2, bladder CA.  He was admitted 04/22 with [facility]. HIs INR was 4.86 when initially checked. He was given FFP and Vitamin K 10 mg IV. He was transfused for H&H of 6.9/20.7 with PRBCs and given saline for fluid resuscitation.   He initially required pressors, and was on Neo for BP support until 04/24.  He was seen by GI and tagged RBC scan showed an area in the sigmoid colon. Flex sig showed an area in the rectum, treated successfully with cautery. He may never be able to go back on anticoagulation.   By 04/24, he was stable off pressors and transferred to telemetry. An echocardiogram was performed, results below. His EF is approximately at his baseline (now 30-35%, was 35-40% in 2014).   Cards asked to see for CHF.   Pt thinks he is in atrial fib sometimes but is not sure. He has never passed out. The last palpitations he felt were a couple of weeks ago. He has felt weak for the past few weeks. Last device interrogation was 10/06/2015. At that time, he was 100% SR with V pacing 91.40% of the time.   He was not having any problems with bleeding until the acute episode the night of admission.   He never gets chest pain.   HIs breathing has been good, but he felt he was gaining weight from fluid in February, then lost it, PTA. He does not know his dry weight, does not weigh every day. His initial weight was 127>>106>>trending up to 120 lbs today. I/O + 11.6 L since admit.   Past Medical History  Diagnosis Date  . Atrial fibrillation (HCC)     Chronic  . Coronary artery disease  2004    Status post CABG with LIMA-LAD, SVG-CFX. Hx of anterior apical infarct.  . Renal insufficiency     Chronic  . Hypertension   . Hyperlipidemia   . Gout   . CHF (congestive heart failure) (HCC)     with chronic ischemic cardiomypathy. EF of 10-15%.  CHF due to systolic dysfunction. Clinically doing well.  [name] GERD (gastroesophageal reflux disease)   . Pacemaker     Medtronic biventricular pacemaker, serial number [id number]  . Type II diabetes mellitus ([place])     "been off my RX for ~ 2 yr" (03/20/2013)  . H/O hiatal hernia   . Arthritis     "right thumb" (03/20/2013)  . Depression   . Anxiety   . Bladder cancer ([place])     "low grade; grade 1; non-invasive" (03/20/2013)  . CKD (chronic kidney disease), stage III      Past Surgical History  Procedure Laterality Date  . Transurethral resection of prostate  1990's; 2004    "twice; injected chemo 1st time; didn't do that 2nd time" (03/20/2013)  . Nasal septum surgery    . US echocardiography  04-17-2009    Est EF 10-15%  . Cardiovascular stress test  04-17-2009    EF 29%  . Coronary artery bypass graft  02/19/2003  LIMA-LAD, SVG-CFX  . Carotid endarterectomy Right 1990's    "left side is still  100% blocked" (03/20/2013)  . Icd lead removal Left 03/01/2013    Procedure: ICD LEAD REMOVAL;  Surgeon: Evans Lance, MD;  Location: [place];  Service: Cardiovascular;  Laterality: Left;  . Pacemaker lead removal Left 03/01/2013    Procedure: PACEMAKER LEAD REMOVAL;  Surgeon: Evans Lance, MD;  Location: [place];  Service: Cardiovascular;  Laterality: Left;  . Pacemaker insertion N/A 03/01/2013    Procedure: INSERTION PACEMAKER LEAD;  Surgeon: Evans Lance, MD;  Location: [place];  Service: Cardiovascular;  Laterality: N/A;  . Hemorrhoid surgery      "lanced several years ago" (03/20/2013)  . Tonsillectomy and adenoidectomy  1933  . Cataract extraction w/ intraocular lens  implant, bilateral Bilateral ~ 2000  . Bi-ventricular pacemaker insertion  N/A 03/20/2013    Procedure: BI-VENTRICULAR PACEMAKER INSERTION (CRT-P);  Surgeon: Evans Lance, MD; Medtronic biventricular pacemaker, serial number [id number]; Laterality: Right   Medtronic biventricular pacemaker, serial number [id number] S Allergies  Allergen Reactions  . Penicillins Hives and Itching    Has patient had a PCN reaction causing immediate rash, facial/tongue/throat swelling, SOB or lightheadedness with hypotension: YES Has patient had a PCN reaction causing severe rash involving mucus membranes or skin necrosis: NO Has patient had a PCN reaction that required hospitalizationNO Has patient had a PCN reaction occurring within the last 10 years: NO If all of the above answers are "NO", then may proceed with Cephalosporin use.    I have reviewed the patient's current medications . allopurinol  300 mg Oral Daily  . aspirin  81 mg Oral Daily  . carvedilol  3.125 mg Oral BID WC  . cefTRIAXone (ROCEPHIN)  IV  1 g Intravenous Q24H  . digoxin  0.0625 mg Oral Daily  . docusate sodium  200 mg Oral BID  . feeding supplement (ENSURE ENLIVE)  237 mL Oral TID BM  . furosemide  40 mg Intravenous BID  . [START ON 12/09/2015] heparin subcutaneous  5,000 Units Subcutaneous Q8H  . insulin aspart  0-5 Units Subcutaneous QHS  . insulin aspart  0-9 Units Subcutaneous TID WC  . insulin aspart  0-9 Units Subcutaneous TID WC  . polyethylene glycol  17 g Oral Daily  . pravastatin  40 mg Oral q1800  . vitamin B-12  1,000 mcg Oral Daily     acetaminophen, ipratropium, ondansetron (ZOFRAN) IV  Medication Sig  allopurinol (ZYLOPRIM) 300 MG tablet Take 300 mg by mouth daily.    ALPRAZolam (XANAX) 0.5 MG tablet Take 1 mg by mouth at bedtime.   calcium-vitamin D (OSCAL WITH D) 500-200 MG-UNIT per tablet Take 1 tablet by mouth daily.   digoxin (LANOXIN) 0.125 MG tablet Take 0.0625 mg by mouth daily.   furosemide (LASIX) 40 MG tablet Take 80 mg by mouth daily.  HYDROcodone-acetaminophen  (NORCO/VICODIN) 5-325 MG per tablet Take 0.5 tablets by mouth at bedtime.   ipratropium (ATROVENT) 0.03 % nasal spray Place 1-2 sprays into both nostrils 3 (three) times daily as needed for rhinitis (congestion).   lovastatin (MEVACOR) 20 MG tablet Take 20 mg by mouth at bedtime. Reported on 11/24/2015  megestrol (MEGACE) 40 MG/ML suspension Take 400 mg by mouth 2 (two) times daily. 10 mls - every morning and at 3pm  Melatonin 10 MG TABS Take 10 mg by mouth at bedtime.  multivitamin-lutein (OCUVITE-LUTEIN) CAPS Take 1 capsule by mouth daily.  Naphazoline HCl (CLEAR EYES  OP) Place 1 drop into both eyes daily as needed (irritation/ eye drop).  spironolactone (ALDACTONE) 25 MG tablet Take 25 mg by mouth daily.  Triamcinolone Acetonide (NASACORT AQ NA) Place 1 spray into both nostrils daily.  vitamin B-12 (CYANOCOBALAMIN) 1000 MCG tablet Take 1,000 mcg by mouth daily.  warfarin (COUMADIN) 2 MG tablet Take 1-2 mg by mouth at bedtime. Take 1/2 tablet (1 mg) by mouth Monday, Wednesday, Friday, take 1 tablet (2 mg) on Sunday, Tuesday, Thursday, Saturday     Social History   Social History  . Marital Status: Married    Spouse Name: N/A  . Number of Children: 1  . Years of Education: N/A   Occupational History  . Retired    Social History Main Topics  . Smoking status: Former Smoker -- 1.00 packs/day for 17 years    Types: Cigarettes    Quit date: 02/28/1960  . Smokeless tobacco: Never Used  . Alcohol Use: Yes     Comment: 03/20/2013 "been about 2-3 months since I've had alcohol; never been a heavy drinker; was 1-2 drinks before dinner probably 5 nights/wk"  . Drug Use: No  . Sexual Activity: Not Currently   Other Topics Concern  . Not on file   Social History Narrative   Lives with wife.    Family Status  Relation Status Death Age  . Mother Deceased     Died of M.I  . Father Deceased     Unknown Causes  . Maternal Grandmother Deceased   . Maternal Grandfather Deceased   . Paternal  Grandmother Deceased   . Paternal Grandfather Deceased    Family History  Problem Relation Age of Onset  . Heart attack Mother      ROS:  Full 14 point review of systems complete and found to be negative unless listed above.  Physical Exam: Blood pressure 122/68, pulse 89, temperature 98.2 F (36.8 C), temperature source Oral, resp. rate 18, height 5\' 8"  (1.727 m), weight 120 lb 3.2 oz (54.522 kg), SpO2 100 %.  General: Well developed, well nourished, male in no acute distress Head: Eyes PERRLA, No xanthomas.   Normocephalic and Ecchymois and small lac L side of face/head, oropharynx without edema or exudate. Dentition: poor Lungs: decreased BS bases w/ few rales R Heart: HRRR S1 S2, no rub/gallop, no sig murmur. pulses are 2+ all 4 extrem.   Neck: No carotid bruits. No lymphadenopathy.  JVD 9-10 cm. Abdomen: Bowel sounds present, abdomen soft and non-tender without masses or hernias noted. Msk:  No spine or cva tenderness. No weakness, no joint deformities or effusions. Extremities: No clubbing or cyanosis. RUE edema.  Neuro: Alert and oriented X 2. No focal deficits noted. Psych:  Good affect, responds appropriately Skin: No rashes or lesions noted.  Labs:   Lab Results  Component Value Date   WBC 21.8* 12/08/2015   HGB 8.0* 12/08/2015   HCT 23.3* 12/08/2015   MCV 86.0 12/08/2015   PLT 106* 12/08/2015    Recent Labs  12/08/15 0425  INR 1.86*    Recent Labs Lab 12/04/15 2350  12/08/15 0913  NA 124*  < > 129*  K 5.2*  < > 5.0  CL 89*  < > 104  CO2 20*  < > 19*  BUN 97*  < > 54*  CREATININE 2.43*  < > 1.36*  CALCIUM 8.6*  < > 7.7*  PROT 6.1*  --   --   BILITOT 1.1  --   --  ALKPHOS 134*  --   --   ALT 45  --   --   AST 44*  --   --   GLUCOSE 288*  < > 98  ALBUMIN 3.4*  --   --   < > = values in this interval not displayed. MAGNESIUM  Date Value Ref Range Status  12/06/2015 1.4* 1.7 - 2.4 mg/dL Final    Recent Labs  12/05/15 1520 12/05/15 1843   TROPONINI 0.13* 0.20*   B NATRIURETIC PEPTIDE  Date/Time Value Ref Range Status  12/06/2015 05:18 AM 2311.3* 0.0 - 100.0 pg/mL Final   LIPASE  Date/Time Value Ref Range Status  12/04/2015 11:50 PM 52* 11 - 51 U/L Final    Echo: 12/07/2015 - Left ventricle: The cavity size was normal. Wall thickness was  normal. Systolic function was moderately to severely reduced. The  estimated ejection fraction was in the range of 30% to 35%. There  is akinesis of the anterior, inferolateral, and apical  myocardium. The study is not technically sufficient to allow  evaluation of LV diastolic function. - Aortic valve: Valve mobility was restricted. There was mild to  moderate stenosis. There was trivial regurgitation. Valve area  (VTI): 1.48 cm^2. Valve area (Vmax): 1.41 cm^2. Valve area  (Vmean): 1.34 cm^2. - Mitral valve: There was mild regurgitation. - Left atrium: The atrium was mildly dilated. - Tricuspid valve: There was moderate regurgitation. - Pulmonary arteries: Systolic pressure was moderately to severely  increased. PA peak pressure: 64 mm Hg (S). Impressions: - Akinesis of the inferolateral wall, anterior wall and apex;  overall moderate to severe reduction in LV function; calcified  aortic valve with mild to moderate AS by continuity equation;  mild MR; mild LAE; moderate TR; moderate to severe pulmonary  hypertension.  ECG:  12/05/2015 SR, BiV pacing  Radiology:  Dg Chest Port 1 View 12/08/2015  CLINICAL DATA:  Shortness of breath EXAM: PORTABLE CHEST 1 VIEW COMPARISON:  12/06/2015 FINDINGS: Chronic cardiomegaly. Biventricular pacer from the right with leads in stable position. Status post CABG. New hazy appearance at the right base with costophrenic sulcus blunting. No pneumothorax. Remote mid left clavicle fracture. IMPRESSION: New small right pleural effusion. Electronically Signed   By: Monte Fantasia M.D.   On: 12/08/2015 06:31   Dg Chest Port 1  View 12/06/2015  CLINICAL DATA:  CHF EXAM: PORTABLE CHEST 1 VIEW COMPARISON:  12/04/2015 chest radiograph. FINDINGS: Sternotomy wires appear aligned and intact. Stable configuration of 2 lead right subclavian pacemaker with lead tips overlying the coronary sinus and right ventricle. CABG clips overlie the mediastinum. Stable cardiomediastinal silhouette with normal heart size. No pneumothorax. No pleural effusion. Lungs appear clear, with no acute consolidative airspace disease and no pulmonary edema. IMPRESSION: No active disease. Electronically Signed   By: Ilona Sorrel M.D.   On: 12/06/2015 14:27    ASSESSMENT AND PLAN:   The patient was seen today by Dr Angelena Form, the patient evaluated and the data reviewed.   1.  Acute on chronic systolic and diastolic heart failure, NYHA class 3 ([place]) - agree with IV Lasix  - follow I/O, BMET, daily weights - BiV pacing most of the time - we will follow with you  2.   GI bleed - rectal bleed, s/p cautery - bowel regimen per GI - GI has signed off, ok to add ASA 81 mg and restart coumadin in a week or 2  3. Atrial fib - none seen on last device interrogation - CHADS2VASC=5  Otherwise, per IM.  Principal Problem:   Shock circulatory (HCC) Active Problems:   Hemorrhagic shock   Acute lower GI bleeding   Rectal/anal ulcer   Lactic acidosis   Bleeding gastrointestinal   Coagulopathy (HCC)   Acute blood loss anemia   CKD (chronic kidney disease), stage III    SignedLenoard Aden 12/08/2015 2:46 PM Beeper [phone]  I have personally seen and examined this patient with Rosaria Ferries, PA-C. I agree with the assessment and plan as outlined above. He is admitted with a GI bleed. INR was supra-therapeutic on admission. Coumadin is on hold. GI found a rectal ulcer. Recs for holding coumadin for several weeks. Exam shows a thin elderly male. RRR, lungs clear. He has upper ext edema and trace LE edema. Agree with IV Lasix today. No other recs  today.   MCALHANY,CHRISTOPHER 12/08/2015 3:04 PM

## 2015-12-09 ENCOUNTER — Encounter (HOSPITAL_COMMUNITY): Payer: Self-pay | Admitting: Internal Medicine

## 2015-12-09 DIAGNOSIS — I5043 Acute on chronic combined systolic (congestive) and diastolic (congestive) heart failure: Secondary | ICD-10-CM

## 2015-12-09 DIAGNOSIS — N183 Chronic kidney disease, stage 3 (moderate): Secondary | ICD-10-CM

## 2015-12-09 DIAGNOSIS — I481 Persistent atrial fibrillation: Secondary | ICD-10-CM

## 2015-12-09 DIAGNOSIS — R5381 Other malaise: Secondary | ICD-10-CM

## 2015-12-09 LAB — CBC
HCT: 24.1 % — ABNORMAL LOW (ref 39.0–52.0)
Hemoglobin: 8.3 g/dL — ABNORMAL LOW (ref 13.0–17.0)
MCH: 30.4 pg (ref 26.0–34.0)
MCHC: 34.4 g/dL (ref 30.0–36.0)
MCV: 88.3 fL (ref 78.0–100.0)
PLATELETS: 130 10*3/uL — AB (ref 150–400)
RBC: 2.73 MIL/uL — ABNORMAL LOW (ref 4.22–5.81)
RDW: 15.1 % (ref 11.5–15.5)
WBC: 19.9 10*3/uL — ABNORMAL HIGH (ref 4.0–10.5)

## 2015-12-09 LAB — GLUCOSE, CAPILLARY
GLUCOSE-CAPILLARY: 138 mg/dL — AB (ref 65–99)
Glucose-Capillary: 156 mg/dL — ABNORMAL HIGH (ref 65–99)
Glucose-Capillary: 123 mg/dL — ABNORMAL HIGH (ref 65–99)
Glucose-Capillary: 131 mg/dL — ABNORMAL HIGH (ref 65–99)
Glucose-Capillary: 161 mg/dL — ABNORMAL HIGH (ref 65–99)

## 2015-12-09 LAB — BASIC METABOLIC PANEL
Anion gap: 10 (ref 5–15)
BUN: 60 mg/dL — AB (ref 6–20)
CALCIUM: 7.7 mg/dL — AB (ref 8.9–10.3)
CO2: 18 mmol/L — ABNORMAL LOW (ref 22–32)
Chloride: 100 mmol/L — ABNORMAL LOW (ref 101–111)
Creatinine, Ser: 1.74 mg/dL — ABNORMAL HIGH (ref 0.61–1.24)
GFR calc Af Amer: 39 mL/min — ABNORMAL LOW (ref >60)
GFR, EST NON AFRICAN AMERICAN: 33 mL/min — AB (ref >60)
GLUCOSE: 153 mg/dL — AB (ref 65–99)
Potassium: 5 mmol/L (ref 3.5–5.1)
Sodium: 128 mmol/L — ABNORMAL LOW (ref 135–145)

## 2015-12-09 LAB — PROTIME-INR
INR: 1.65 — AB (ref 0.00–1.49)
Prothrombin Time: 19.5 seconds — ABNORMAL HIGH (ref 11.6–15.2)

## 2015-12-09 LAB — HEMOGLOBIN A1C
HEMOGLOBIN A1C: 5.9 % — AB (ref 4.8–5.6)
Mean Plasma Glucose: 123 mg/dL

## 2015-12-09 MED ORDER — FUROSEMIDE 10 MG/ML IJ SOLN: 40.0000 mg | Freq: Every day | INTRAMUSCULAR | Status: DC

## 2015-12-09 MED ORDER — CARVEDILOL 3.125 MG PO TABS
3.1250 mg | ORAL_TABLET | Freq: Two times a day (BID) | ORAL | Status: DC
  Administered 2015-12-10 – 2015-12-11 (×3): 3.125 mg via ORAL
  Filled 2015-12-09 (×4): qty 1

## 2015-12-09 NOTE — H&P (Signed)
Physical Medicine and Rehabilitation Admission H&P    Chief Complaint  Patient presents with  . GI Bleeding  : HPI: [name] is a 82 y.o. right handed male with history of atrial fibrillation maintained on chronic Coumadin, coronary artery disease/Pacemaker status post CABG, systolic and diastolic congestive heart failure, type 2 diabetes mellitus, bladder cancer. Lives with wife in [place]. Reports to be independent prior to admission with a general decline over the past few months.Recent fall and hit the corner of his eye on the sink. Presented 12/05/2015 with bright red blood from the rectum as well as a recent fall from the toilet approximately 1 week ago. Patient reports some recent constipation 2 weeks ago but denied any nausea vomiting or abdominal pain. Patient with ongoing active bleeding in the ED. Hemoglobin 6.5. INR 4.86. EKG showed new ST changes and T-wave inversions in the anterior leads. Troponin 0.20. Patient did receive vitamin K fresh frozen plasma 2 units and 1 unit of packed red blood cells. Gastroenterology services consulted Dr. Carlean Purl with findings of a single solitary ulcer in the rectum and in the distal rectum. Underwent epi injection and then cauterization. Close monitoring of hemoglobin status post PRBCs 8 with latest hemoglobin 8.3. Patient's chronic Coumadin currently remains on hold and presently on aspirin.Vascular study bilateral upper extremities 12/10/2015 showed superficial thrombus noted in the right basilic vein and in the left cephalic vein. He has been placed on subcutaneous heparin for DVT prophylaxis and monitored. Current plan is to remain off Coumadin for 2 weeks per gastroenterology services. Follow-up cardiology services with echocardiogram showing ejection fraction of 35%. Akinesis of the anterior inferior lateral and apical myocardium. Placed on intravenous Lasix for acute on chronic systolic and diastolic heart failure and  transition to by mouth. Close monitoring of creatinine 1.89-2.43. Tolerating a regular diet. Urine culture Escherichia coli presently maintained on Rocephin. Physical therapy evaluation completed 12/08/2015 for debilitation with recommendations of physical medicine rehabilitation consult. Patient was admitted for a comprehensive rehabilitation program  ROS Constitutional: Negative for fever and chills.  HENT: Negative for hearing loss.  Eyes: Negative for blurred vision and double vision.  Respiratory: Positive for shortness of breath. Negative for cough.  Cardiovascular: Positive for palpitations and leg swelling. Negative for chest pain.  Gastrointestinal: Positive for constipation. Negative for nausea and vomiting.  Genitourinary: Negative for dysuria and hematuria.  Musculoskeletal: Positive for myalgias, joint pain and falls. Negative for back pain.  Skin: Negative for rash.  Neurological: Positive for weakness. Negative for seizures and headaches.  All other systems reviewed and are negative   Past Medical History  Diagnosis Date  . Atrial fibrillation (HCC)     Chronic  . Coronary artery disease 2004    Status post CABG with LIMA-LAD, SVG-CFX. Hx of anterior apical infarct.  . Renal insufficiency     Chronic  . Hypertension   . Hyperlipidemia   . Gout   . CHF (congestive heart failure) (HCC)     with chronic ischemic cardiomypathy. EF of 10-15%.  CHF due to systolic dysfunction. Clinically doing well.  [name] GERD (gastroesophageal reflux disease)   . Pacemaker     Medtronic biventricular pacemaker, serial number [id number]  . Type II diabetes mellitus ([place])     "been off my RX for ~ 2 yr" (03/20/2013)  . H/O hiatal hernia   . Arthritis     "right thumb" (03/20/2013)  . Depression   . Anxiety   . Bladder  cancer ([name])     "low grade; grade 1; non-invasive" (03/20/2013)  . CKD (chronic kidney disease), stage III    Past Surgical History  Procedure Laterality Date  .  Transurethral resection of prostate  1990's; 2004    "twice; injected chemo 1st time; didn't do that 2nd time" (03/20/2013)  . Nasal septum surgery    . US echocardiography  04-17-2009    Est EF 10-15%  . Cardiovascular stress test  04-17-2009    EF 29%  . Coronary artery bypass graft  02/19/2003    LIMA-LAD, SVG-CFX  . Carotid endarterectomy Right 1990's    "left side is still  100% blocked" (03/20/2013)  . Icd lead removal Left 03/01/2013    Procedure: ICD LEAD REMOVAL;  Surgeon: Evans Lance, MD;  Location: [facility] [facility];  Service: Cardiovascular;  Laterality: Left;  . Pacemaker lead removal Left 03/01/2013    Procedure: PACEMAKER LEAD REMOVAL;  Surgeon: Evans Lance, MD;  Location: [place];  Service: Cardiovascular;  Laterality: Left;  . Pacemaker insertion N/A 03/01/2013    Procedure: INSERTION PACEMAKER LEAD;  Surgeon: Evans Lance, MD;  Location: [facility];  Service: Cardiovascular;  Laterality: N/A;  . Hemorrhoid surgery      "lanced several years ago" (03/20/2013)  . Tonsillectomy and adenoidectomy  1933  . Cataract extraction w/ intraocular lens  implant, bilateral Bilateral ~ 2000  . Bi-ventricular pacemaker insertion N/A 03/20/2013    Procedure: BI-VENTRICULAR PACEMAKER INSERTION (CRT-P);  Surgeon: Evans Lance, MD; Medtronic biventricular pacemaker, serial number [id number]; Laterality: Right   Family History  Problem Relation Age of Onset  . Heart attack Mother    Social History:  reports that he quit smoking about 55 years ago. His smoking use included Cigarettes. He has a 17 pack-year smoking history. He has never used smokeless tobacco. He reports that he drinks alcohol. He reports that he does not use illicit drugs. Allergies:  Allergies  Allergen Reactions  . Penicillins Hives and Itching    Has patient had a PCN reaction causing immediate rash, facial/tongue/throat swelling, SOB or lightheadedness with hypotension: YES Has patient had a PCN reaction causing severe rash involving mucus  membranes or skin necrosis: NO Has patient had a PCN reaction that required hospitalizationNO Has patient had a PCN reaction occurring within the last 10 years: NO If all of the above answers are "NO", then may proceed with Cephalosporin use.   Medications Prior to Admission  Medication Sig Dispense Refill  . allopurinol (ZYLOPRIM) 300 MG tablet Take 300 mg by mouth daily.      [name] ALPRAZolam (XANAX) 0.5 MG tablet Take 1 mg by mouth at bedtime.     . calcium-vitamin D (OSCAL WITH D) 500-200 MG-UNIT per tablet Take 1 tablet by mouth daily.     . digoxin (LANOXIN) 0.125 MG tablet Take 0.0625 mg by mouth daily.     . furosemide (LASIX) 40 MG tablet Take 80 mg by mouth daily.    [name] HYDROcodone-acetaminophen (NORCO/VICODIN) 5-325 MG per tablet Take 0.5 tablets by mouth at bedtime.     [name] ipratropium (ATROVENT) 0.03 % nasal spray Place 1-2 sprays into both nostrils 3 (three) times daily as needed for rhinitis (congestion).   4  . lovastatin (MEVACOR) 20 MG tablet Take 20 mg by mouth at bedtime. Reported on 11/24/2015    . megestrol (MEGACE) 40 MG/ML suspension Take 400 mg by mouth 2 (two) times daily. 10 mls - every morning and at 3pm    .  Melatonin 10 MG TABS Take 10 mg by mouth at bedtime.    . multivitamin-lutein (OCUVITE-LUTEIN) CAPS Take 1 capsule by mouth daily.    . Naphazoline HCl (CLEAR EYES OP) Place 1 drop into both eyes daily as needed (irritation/ eye drop).    [name] spironolactone (ALDACTONE) 25 MG tablet Take 25 mg by mouth daily.    . Triamcinolone Acetonide (NASACORT AQ NA) Place 1 spray into both nostrils daily.    . vitamin B-12 (CYANOCOBALAMIN) 1000 MCG tablet Take 1,000 mcg by mouth daily.    [name] warfarin (COUMADIN) 2 MG tablet Take 1-2 mg by mouth at bedtime. Take 1/2 tablet (1 mg) by mouth Monday, Wednesday, Friday, take 1 tablet (2 mg) on Sunday, Tuesday, Thursday, Saturday      Home: Home Living Family/patient expects to be discharged to:: Private residence Living Arrangements:  Spouse/significant other Available Help at Discharge: Family, Available 24 hours/day ("my wife is 70 yrs younger than me") Type of Home: House Home Equipment: None Additional Comments: Pt provides different answers to same question so home setup unknown   Functional History: Prior Function Level of Independence: Independent Comments: per pt  Functional Status:  Mobility: Bed Mobility Overal bed mobility: Needs Assistance Bed Mobility: Sidelying to Sit, Sit to Supine Sidelying to sit: Min guard Sit to supine: Supervision General bed mobility comments: minguard due to incr effort and reports of dizziness; after returned to bed, pt felt he needed to have BM and assisted to [facility] Transfers Overall transfer level: Needs assistance Equipment used: None Transfers: Sit to/from Stand, Stand Pivot Transfers Sit to Stand: Min guard Stand pivot transfers: Min assist General transfer comment: pt with difficulty sequencing and aligning himself with [facility] Ambulation/Gait Ambulation/Gait assistance: Min assist Ambulation Distance (Feet): 25 Feet Assistive device:  (holding IV pole) Gait Pattern/deviations: Step-through pattern, Decreased stride length General Gait Details: steady Gait velocity interpretation: Below normal speed for age/gender    ADL:    Cognition: Cognition Overall Cognitive Status: Impaired/Different from baseline Orientation Level: Oriented X4 Cognition Arousal/Alertness: Awake/alert Behavior During Therapy: WFL for tasks assessed/performed Overall Cognitive Status: Impaired/Different from baseline Area of Impairment: Memory, Following commands, Safety/judgement, Problem solving Memory: Decreased short-term memory Following Commands: Follows one step commands with increased time Safety/Judgement: Decreased awareness of safety, Decreased awareness of deficits Problem Solving: Slow processing, Decreased initiation, Requires verbal cues, Requires tactile cues  Physical  Exam: Blood pressure 128/73, pulse 89, temperature 98.5 F (36.9 C), temperature source Oral, resp. rate 18, height 5' 8"  (1.727 m), weight 51.166 kg (112 lb 12.8 oz), SpO2 99 %. Physical Exam Constitutional: He is oriented to person, place, and time.  82 year old frail right-handed male  HENT: oral mucosa pink and moist Extensive bruising to the left side of the face.  Eyes: EOM are normal.  Neck: Normal range of motion. Neck supple. No thyromegaly present.  Cardiovascular:  Cardiac rate controlled, irregular Respiratory: Effort normal and breath sounds normal. No wheezes, rales, or rhonchi GI: Soft. Bowel sounds are normal. He exhibits no distension.   .  Skin/ext: 2-3+ edema right upper limb with extensive ecchymosis 1+ edema left upper limb with extensive ecchymosis Small 1.5cm wound near right fibular head.  Neuro: A and O x 3. Answers biographical information. Follows all commands. Good insight and awareness. Motor strength is 3+ in the right deltoid, biceps, triceps, grip (limited by pain,swelling)) 4/5 in the left deltoid, biceps, triceps, grip 3+ in bilateral hip flexor and, 4 knee extensor and ankle dorsiflexor/plantarflexor Psych: pleasant and  cooperative  Results for orders placed or performed during the hospital encounter of 12/04/15 (from the past 48 hour(s))  Glucose, capillary     Status: Abnormal   Collection Time: 12/07/15  3:01 PM  Result Value Ref Range   Glucose-Capillary 173 (H) 65 - 99 mg/dL  CBC     Status: Abnormal   Collection Time: 12/07/15  6:12 PM  Result Value Ref Range   WBC 18.9 (H) 4.0 - 10.5 K/uL   RBC 2.66 (L) 4.22 - 5.81 MIL/uL   Hemoglobin 7.8 (L) 13.0 - 17.0 g/dL   HCT 22.8 (L) 39.0 - 52.0 %   MCV 85.7 78.0 - 100.0 fL   MCH 29.3 26.0 - 34.0 pg   MCHC 34.2 30.0 - 36.0 g/dL   RDW 14.8 11.5 - 15.5 %   Platelets 89 (L) 150 - 400 K/uL    Comment: CONSISTENT WITH PREVIOUS RESULT  Glucose, capillary     Status: Abnormal   Collection Time:  12/07/15  8:33 PM  Result Value Ref Range   Glucose-Capillary 256 (H) 65 - 99 mg/dL  Glucose, capillary     Status: Abnormal   Collection Time: 12/07/15 11:55 PM  Result Value Ref Range   Glucose-Capillary 179 (H) 65 - 99 mg/dL   Comment 1 Notify RN    Comment 2 Document in Chart   Protime-INR     Status: Abnormal   Collection Time: 12/08/15  4:25 AM  Result Value Ref Range   Prothrombin Time 21.4 (H) 11.6 - 15.2 seconds   INR 1.86 (H) 0.00 - 1.49  CBC     Status: Abnormal   Collection Time: 12/08/15  4:25 AM  Result Value Ref Range   WBC 21.8 (H) 4.0 - 10.5 K/uL   RBC 2.71 (L) 4.22 - 5.81 MIL/uL   Hemoglobin 8.0 (L) 13.0 - 17.0 g/dL   HCT 23.3 (L) 39.0 - 52.0 %   MCV 86.0 78.0 - 100.0 fL   MCH 29.5 26.0 - 34.0 pg   MCHC 34.3 30.0 - 36.0 g/dL   RDW 15.2 11.5 - 15.5 %   Platelets 106 (L) 150 - 400 K/uL    Comment: SPECIMEN CHECKED FOR CLOTS REPEATED TO VERIFY CONSISTENT WITH PREVIOUS RESULT   Glucose, capillary     Status: None   Collection Time: 12/08/15  6:26 AM  Result Value Ref Range   Glucose-Capillary 98 65 - 99 mg/dL  Basic metabolic panel     Status: Abnormal   Collection Time: 12/08/15  9:13 AM  Result Value Ref Range   Sodium 129 (L) 135 - 145 mmol/L   Potassium 5.0 3.5 - 5.1 mmol/L   Chloride 104 101 - 111 mmol/L   CO2 19 (L) 22 - 32 mmol/L   Glucose, Bld 98 65 - 99 mg/dL   BUN 54 (H) 6 - 20 mg/dL   Creatinine, Ser 1.36 (H) 0.61 - 1.24 mg/dL   Calcium 7.7 (L) 8.9 - 10.3 mg/dL   GFR calc non Af Amer 45 (L) >60 mL/min   GFR calc Af Amer 52 (L) >60 mL/min    Comment: (NOTE) The eGFR has been calculated using the CKD EPI equation. This calculation has not been validated in all clinical situations. eGFR's persistently <60 mL/min signify possible Chronic Kidney Disease.    Anion gap 6 5 - 15  Hemoglobin A1c     Status: Abnormal   Collection Time: 12/08/15  9:17 AM  Result Value Ref Range   Hgb A1c  MFr Bld 5.9 (H) 4.8 - 5.6 %    Comment: (NOTE)          Pre-diabetes: 5.7 - 6.4         Diabetes: >6.4         Glycemic control for adults with diabetes: <7.0    Mean Plasma Glucose 123 mg/dL    Comment: (NOTE) Performed At: [facility] [place], [place] [id number] Lindon Romp MD BZ:[id number]   Glucose, capillary     Status: Abnormal   Collection Time: 12/08/15  4:30 PM  Result Value Ref Range   Glucose-Capillary 151 (H) 65 - 99 mg/dL  Protime-INR     Status: Abnormal   Collection Time: 12/09/15  4:53 AM  Result Value Ref Range   Prothrombin Time 19.5 (H) 11.6 - 15.2 seconds   INR 1.65 (H) 0.00 - 1.49  CBC     Status: Abnormal   Collection Time: 12/09/15  4:53 AM  Result Value Ref Range   WBC 19.9 (H) 4.0 - 10.5 K/uL   RBC 2.73 (L) 4.22 - 5.81 MIL/uL   Hemoglobin 8.3 (L) 13.0 - 17.0 g/dL   HCT 24.1 (L) 39.0 - 52.0 %   MCV 88.3 78.0 - 100.0 fL   MCH 30.4 26.0 - 34.0 pg   MCHC 34.4 30.0 - 36.0 g/dL   RDW 15.1 11.5 - 15.5 %   Platelets 130 (L) 150 - 400 K/uL  Basic metabolic panel     Status: Abnormal   Collection Time: 12/09/15  4:53 AM  Result Value Ref Range   Sodium 128 (L) 135 - 145 mmol/L   Potassium 5.0 3.5 - 5.1 mmol/L   Chloride 100 (L) 101 - 111 mmol/L   CO2 18 (L) 22 - 32 mmol/L   Glucose, Bld 153 (H) 65 - 99 mg/dL   BUN 60 (H) 6 - 20 mg/dL   Creatinine, Ser 1.74 (H) 0.61 - 1.24 mg/dL   Calcium 7.7 (L) 8.9 - 10.3 mg/dL   GFR calc non Af Amer 33 (L) >60 mL/min   GFR calc Af Amer 39 (L) >60 mL/min    Comment: (NOTE) The eGFR has been calculated using the CKD EPI equation. This calculation has not been validated in all clinical situations. eGFR's persistently <60 mL/min signify possible Chronic Kidney Disease.    Anion gap 10 5 - 15  Glucose, capillary     Status: Abnormal   Collection Time: 12/09/15  5:29 AM  Result Value Ref Range   Glucose-Capillary 156 (H) 65 - 99 mg/dL   Comment 1 Notify RN    Comment 2 Document in Chart   Glucose, capillary     Status: Abnormal   Collection  Time: 12/09/15  9:06 AM  Result Value Ref Range   Glucose-Capillary 123 (H) 65 - 99 mg/dL   Dg Chest Port 1 View  12/08/2015  CLINICAL DATA:  Shortness of breath EXAM: PORTABLE CHEST 1 VIEW COMPARISON:  12/06/2015 FINDINGS: Chronic cardiomegaly. Biventricular pacer from the right with leads in stable position. Status post CABG. New hazy appearance at the right base with costophrenic sulcus blunting. No pneumothorax. Remote mid left clavicle fracture. IMPRESSION: New small right pleural effusion. Electronically Signed   By: Monte Fantasia M.D.   On: 12/08/2015 06:31       Medical Problem List and Plan: 1.  Deconditioning secondary to GI bleed status post epi injection and cauterization/multi-medical  -admit to inpatient rehab today 2.  DVT Prophylaxis/Anticoagulation:Bilateral upper  extremity venous Doppler study showed a superficial thrombus right basilic vein and in the left cephalic vein. Subcutaneous heparin for now. Monitor platelet counts and any signs of bleeding. 3. Pain Management: Tylenol as needed 4. CAD status post CABG. No chest pain or shortness of breath 5. Neuropsych: This patient is capable of making decisions on his own behalf. 6. Skin/Wound Care: Routine skin checks. Local care to bruises, RUE edema, various wounds 7. Fluids/Electrolytes/Nutrition: Routine I&O's with follow-up chemistries 8. Atrial fibrillation. Coumadin currently remains on hold due to GI bleed 2 weeks then resume. Lanoxin 0.625 mg daily. Presently on aspirin. Cardiac rate control 9. Hypertension. Coreg 3.125 mg twice a day, Lanoxin 0.625 mg daily, Aldactone 25 mg daily 10. Systolic diastolic congestive heart failure. Continue Lasix 80 mg daily. Weight patient daily. Monitor for any signs of fluid overload 11. Anemia. Follow-up CBC 12. Diet-controlled Diabetes mellitus. Hemoglobin A1c 5.9. Check blood sugars before meals and at bedtime. Continue sliding scale 13. Escherichia coli urinary tract infection.  Complete Rocephin 14. Gout. Zyloprim 300 mg daily. Monitor for any gout flareups 15. Hyperlipidemia. Pravachol 16. Constipation. Miralax   Post Admission Physician Evaluation: 1. Functional deficits secondary  to debility after GI bleed/fall. 2. Patient is admitted to receive collaborative, interdisciplinary care between the physiatrist, rehab nursing staff, and therapy team. 3. Patient's level of medical complexity and substantial therapy needs in context of that medical necessity cannot be provided at a lesser intensity of care such as a SNF. 4. Patient has experienced substantial functional loss from his/her baseline which was documented above under the "Functional History" and "Functional Status" headings.  Judging by the patient's diagnosis, physical exam, and functional history, the patient has potential for functional progress which will result in measurable gains while on inpatient rehab.  These gains will be of substantial and practical use upon discharge  in facilitating mobility and self-care at the household level. 5. Physiatrist will provide 24 hour management of medical needs as well as oversight of the therapy plan/treatment and provide guidance as appropriate regarding the interaction of the two. 6. 24 hour rehab nursing will assist with bladder management, bowel management, safety, skin/wound care, disease management, medication administration, pain management and patient education  and help integrate therapy concepts, techniques,education, etc. 7. PT will assess and treat for/with: Lower extremity strength, range of motion, stamina, balance, functional mobility, safety, adaptive techniques and equipment, NMR, ego support, education, community reintegration.   Goals are: mod I. 8. OT will assess and treat for/with: ADL's, functional mobility, safety, upper extremity strength, adaptive techniques and equipment.   Goals are: mod I. Therapy may proceed with showering this patient. 9. SLP  will assess and treat for/with: n/a.  Goals are: n/a. 10. Case Management and Social Worker will assess and treat for psychological issues and discharge planning. 11. Team conference will be held weekly to assess progress toward goals and to determine barriers to discharge. 12. Patient will receive at least 3 hours of therapy per day at least 5 days per week. 13. ELOS: 7 days       14. Prognosis:  excellent     Meredith Staggers, MD, [facility] 12/11/2015

## 2015-12-09 NOTE — Clinical Social Work Note (Signed)
CSW received consult for patient needing SNF as a backup in case [facility] is not able to take patient.  CSW to continue to follow patient's progress, assessment to follow at a later time.  Jones Broom. Dolores, MSW, [place] 12/09/2015 6:09 PM

## 2015-12-09 NOTE — Consult Note (Signed)
Physical Medicine and Rehabilitation Consult Reason for Consult: Debilitation/GI bleed Referring Physician: [facility]   HPI: [name] is a 82 y.o. right handed male with history of atrial fibrillation maintained on chronic Coumadin, coronary artery disease status post CABG, systolic and diastolic congestive heart failure, type 2 diabetes mellitus, bladder cancer. Lives with wife in [place]. Reports to be independent prior to admission with a general decline over the past few months. Presented 12/05/2015 with bright red blood from the rectum as well as a recent fall from the toilet approximately 1 week ago. Patient reports some recent constipation 2 weeks ago but denied any nausea vomiting or abdominal pain. Patient with ongoing active bleeding in the ED. Hemoglobin 6.5. INR 4.86. EKG showed new ST changes and T-wave inversions in the anterior leads. Troponin 0.20. Patient did receive vitamin K fresh frozen plasma 2 units and 1 unit of packed red blood cells. Gastroenterology services consulted Dr. Carlean Purl with findings of a single solitary ulcer in the rectum and in the distal rectum. Underwent epi injection and then cauterization. Close monitoring of hemoglobin status post PRBCs 8 with latest hemoglobin 8.3. Patient's chronic Coumadin currently remains on hold. He has been placed on subcutaneous heparin for DVT prophylaxis. Follow-up cardiology services with echocardiogram showing ejection fraction of 35%. Akinesis of the anterior inferior lateral and apical myocardium. Placed on intravenous Lasix for acute on chronic systolic and diastolic heart failure. Close monitoring of creatinine 1.89-2.43. Tolerating a regular diet. Urine culture Escherichia coli presently maintained on Rocephin. Physical therapy evaluation completed 12/08/2015 for debilitation with recommendations of physical medicine rehabilitation consult.  Patient fell prior to admission and hit the corner of his left  eye on the sink Patient also states that he has had lower extremity swelling as well as right greater than left upper extremity swelling even at home prior to admission.  Review of Systems  Constitutional: Negative for fever and chills.  HENT: Negative for hearing loss.   Eyes: Negative for blurred vision and double vision.  Respiratory: Positive for shortness of breath. Negative for cough.   Cardiovascular: Positive for palpitations and leg swelling. Negative for chest pain.  Gastrointestinal: Positive for constipation. Negative for nausea and vomiting.  Genitourinary: Negative for dysuria and hematuria.  Musculoskeletal: Positive for myalgias, joint pain and falls. Negative for back pain.  Skin: Negative for rash.  Neurological: Positive for weakness. Negative for seizures and headaches.  All other systems reviewed and are negative.  Past Medical History  Diagnosis Date  . Atrial fibrillation (HCC)     Chronic  . Coronary artery disease 2004    Status post CABG with LIMA-LAD, SVG-CFX. Hx of anterior apical infarct.  . Renal insufficiency     Chronic  . Hypertension   . Hyperlipidemia   . Gout   . CHF (congestive heart failure) (HCC)     with chronic ischemic cardiomypathy. EF of 10-15%.  CHF due to systolic dysfunction. Clinically doing well.  [name] GERD (gastroesophageal reflux disease)   . Pacemaker     Medtronic biventricular pacemaker, serial number [id number]  . Type II diabetes mellitus ([place])     "been off my RX for ~ 2 yr" (03/20/2013)  . H/O hiatal hernia   . Arthritis     "right thumb" (03/20/2013)  . Depression   . Anxiety   . Bladder cancer ([place])     "low grade; grade 1; non-invasive" (03/20/2013)  . CKD (chronic kidney disease), stage III  Past Surgical History  Procedure Laterality Date  . Transurethral resection of prostate  1990's; 2004    "twice; injected chemo 1st time; didn't do that 2nd time" (03/20/2013)  . Nasal septum surgery    . US echocardiography   04-17-2009    Est EF 10-15%  . Cardiovascular stress test  04-17-2009    EF 29%  . Coronary artery bypass graft  02/19/2003    LIMA-LAD, SVG-CFX  . Carotid endarterectomy Right 1990's    "left side is still  100% blocked" (03/20/2013)  . Icd lead removal Left 03/01/2013    Procedure: ICD LEAD REMOVAL;  Surgeon: Evans Lance, MD;  Location: [facility];  Service: Cardiovascular;  Laterality: Left;  . Pacemaker lead removal Left 03/01/2013    Procedure: PACEMAKER LEAD REMOVAL;  Surgeon: Evans Lance, MD;  Location: [facility];  Service: Cardiovascular;  Laterality: Left;  . Pacemaker insertion N/A 03/01/2013    Procedure: INSERTION PACEMAKER LEAD;  Surgeon: Evans Lance, MD;  Location: [facility];  Service: Cardiovascular;  Laterality: N/A;  . Hemorrhoid surgery      "lanced several years ago" (03/20/2013)  . Tonsillectomy and adenoidectomy  1933  . Cataract extraction w/ intraocular lens  implant, bilateral Bilateral ~ 2000  . Bi-ventricular pacemaker insertion N/A 03/20/2013    Procedure: BI-VENTRICULAR PACEMAKER INSERTION (CRT-P);  Surgeon: Evans Lance, MD; Medtronic biventricular pacemaker, serial number [id number]; Laterality: Right   Family History  Problem Relation Age of Onset  . Heart attack Mother    Social History:  reports that he quit smoking about 55 years ago. His smoking use included Cigarettes. He has a 17 pack-year smoking history. He has never used smokeless tobacco. He reports that he drinks alcohol. He reports that he does not use illicit drugs. Allergies:  Allergies  Allergen Reactions  . Penicillins Hives and Itching    Has patient had a PCN reaction causing immediate rash, facial/tongue/throat swelling, SOB or lightheadedness with hypotension: YES Has patient had a PCN reaction causing severe rash involving mucus membranes or skin necrosis: NO Has patient had a PCN reaction that required hospitalizationNO Has patient had a PCN reaction occurring within the last 10 years: NO If all  of the above answers are "NO", then may proceed with Cephalosporin use.   Medications Prior to Admission  Medication Sig Dispense Refill  . allopurinol (ZYLOPRIM) 300 MG tablet Take 300 mg by mouth daily.      [name] ALPRAZolam (XANAX) 0.5 MG tablet Take 1 mg by mouth at bedtime.     . calcium-vitamin D (OSCAL WITH D) 500-200 MG-UNIT per tablet Take 1 tablet by mouth daily.     . digoxin (LANOXIN) 0.125 MG tablet Take 0.0625 mg by mouth daily.     . furosemide (LASIX) 40 MG tablet Take 80 mg by mouth daily.    [name] HYDROcodone-acetaminophen (NORCO/VICODIN) 5-325 MG per tablet Take 0.5 tablets by mouth at bedtime.     [name] ipratropium (ATROVENT) 0.03 % nasal spray Place 1-2 sprays into both nostrils 3 (three) times daily as needed for rhinitis (congestion).   4  . lovastatin (MEVACOR) 20 MG tablet Take 20 mg by mouth at bedtime. Reported on 11/24/2015    . megestrol (MEGACE) 40 MG/ML suspension Take 400 mg by mouth 2 (two) times daily. 10 mls - every morning and at 3pm    . Melatonin 10 MG TABS Take 10 mg by mouth at bedtime.    . multivitamin-lutein (OCUVITE-LUTEIN) CAPS Take 1  capsule by mouth daily.    . Naphazoline HCl (CLEAR EYES OP) Place 1 drop into both eyes daily as needed (irritation/ eye drop).    [name] spironolactone (ALDACTONE) 25 MG tablet Take 25 mg by mouth daily.    . Triamcinolone Acetonide (NASACORT AQ NA) Place 1 spray into both nostrils daily.    . vitamin B-12 (CYANOCOBALAMIN) 1000 MCG tablet Take 1,000 mcg by mouth daily.    [name] warfarin (COUMADIN) 2 MG tablet Take 1-2 mg by mouth at bedtime. Take 1/2 tablet (1 mg) by mouth Monday, Wednesday, Friday, take 1 tablet (2 mg) on Sunday, Tuesday, Thursday, Saturday      Home: Home Living Family/patient expects to be discharged to:: Private residence Living Arrangements: Spouse/significant other Available Help at Discharge: Family, Available 24 hours/day ("my wife is 26 yrs younger than me") Type of Home: House Home Equipment:  None Additional Comments: Pt provides different answers to same question so home setup unknown  Functional History: Prior Function Level of Independence: Independent Comments: per pt Functional Status:  Mobility: Bed Mobility Overal bed mobility: Needs Assistance Bed Mobility: Sidelying to Sit, Sit to Supine Sidelying to sit: Min guard Sit to supine: Supervision General bed mobility comments: minguard due to incr effort and reports of dizziness; after returned to bed, pt felt he needed to have BM and assisted to [facility] Transfers Overall transfer level: Needs assistance Equipment used: None Transfers: Sit to/from Stand, Stand Pivot Transfers Sit to Stand: Min guard Stand pivot transfers: Min assist General transfer comment: pt with difficulty sequencing and aligning himself with [facility] Ambulation/Gait Ambulation/Gait assistance: Min assist Ambulation Distance (Feet): 25 Feet Assistive device:  (holding IV pole) Gait Pattern/deviations: Step-through pattern, Decreased stride length General Gait Details: steady Gait velocity interpretation: Below normal speed for age/gender    ADL:    Cognition: Cognition Overall Cognitive Status: Impaired/Different from baseline Orientation Level: Oriented X4 Cognition Arousal/Alertness: Awake/alert Behavior During Therapy: WFL for tasks assessed/performed Overall Cognitive Status: Impaired/Different from baseline Area of Impairment: Memory, Following commands, Safety/judgement, Problem solving Memory: Decreased short-term memory Following Commands: Follows one step commands with increased time Safety/Judgement: Decreased awareness of safety, Decreased awareness of deficits Problem Solving: Slow processing, Decreased initiation, Requires verbal cues, Requires tactile cues  Blood pressure 128/73, pulse 89, temperature 98.5 F (36.9 C), temperature source Oral, resp. rate 18, height 5\' 8"  (1.727 m), weight 54.522 kg (120 lb 3.2 oz), SpO2 99  %. Physical Exam  Constitutional: He is oriented to person, place, and time.  82 year old frail right-handed male  HENT:  Bruising to the left side of the face.  Eyes: EOM are normal.  Neck: Normal range of motion. Neck supple. No thyromegaly present.  Cardiovascular:  Cardiac rate control  Respiratory: Effort normal and breath sounds normal.  GI: Soft. Bowel sounds are normal. He exhibits no distension.  Neurological: He is alert and oriented to person, place, and time.  Skin: Skin is warm and dry.  Motor strength is 4 minus in the right deltoid, biceps, triceps, grip 4/5 in the left deltoid, biceps, triceps, grip 4+ in bilateral hip flexor and knee extensor and ankle dorsiflexor He has no evidence of edema in the lower limbs 2-3+ edema right upper limb with extensive ecchymosis 1+ edema left upper limb with extensive ecchymosis  Results for orders placed or performed during the hospital encounter of 12/04/15 (from the past 24 hour(s))  Basic metabolic panel     Status: Abnormal   Collection Time: 12/08/15  9:13 AM  Result Value Ref Range   Sodium 129 (L) 135 - 145 mmol/L   Potassium 5.0 3.5 - 5.1 mmol/L   Chloride 104 101 - 111 mmol/L   CO2 19 (L) 22 - 32 mmol/L   Glucose, Bld 98 65 - 99 mg/dL   BUN 54 (H) 6 - 20 mg/dL   Creatinine, Ser 1.36 (H) 0.61 - 1.24 mg/dL   Calcium 7.7 (L) 8.9 - 10.3 mg/dL   GFR calc non Af Amer 45 (L) >60 mL/min   GFR calc Af Amer 52 (L) >60 mL/min   Anion gap 6 5 - 15  Hemoglobin A1c     Status: Abnormal   Collection Time: 12/08/15  9:17 AM  Result Value Ref Range   Hgb A1c MFr Bld 5.9 (H) 4.8 - 5.6 %   Mean Plasma Glucose 123 mg/dL  Glucose, capillary     Status: Abnormal   Collection Time: 12/08/15  4:30 PM  Result Value Ref Range   Glucose-Capillary 151 (H) 65 - 99 mg/dL  Protime-INR     Status: Abnormal   Collection Time: 12/09/15  4:53 AM  Result Value Ref Range   Prothrombin Time 19.5 (H) 11.6 - 15.2 seconds   INR 1.65 (H) 0.00 -  1.49  CBC     Status: Abnormal   Collection Time: 12/09/15  4:53 AM  Result Value Ref Range   WBC 19.9 (H) 4.0 - 10.5 K/uL   RBC 2.73 (L) 4.22 - 5.81 MIL/uL   Hemoglobin 8.3 (L) 13.0 - 17.0 g/dL   HCT 24.1 (L) 39.0 - 52.0 %   MCV 88.3 78.0 - 100.0 fL   MCH 30.4 26.0 - 34.0 pg   MCHC 34.4 30.0 - 36.0 g/dL   RDW 15.1 11.5 - 15.5 %   Platelets 130 (L) 150 - 400 K/uL  Basic metabolic panel     Status: Abnormal   Collection Time: 12/09/15  4:53 AM  Result Value Ref Range   Sodium 128 (L) 135 - 145 mmol/L   Potassium 5.0 3.5 - 5.1 mmol/L   Chloride 100 (L) 101 - 111 mmol/L   CO2 18 (L) 22 - 32 mmol/L   Glucose, Bld 153 (H) 65 - 99 mg/dL   BUN 60 (H) 6 - 20 mg/dL   Creatinine, Ser 1.74 (H) 0.61 - 1.24 mg/dL   Calcium 7.7 (L) 8.9 - 10.3 mg/dL   GFR calc non Af Amer 33 (L) >60 mL/min   GFR calc Af Amer 39 (L) >60 mL/min   Anion gap 10 5 - 15  Glucose, capillary     Status: Abnormal   Collection Time: 12/09/15  5:29 AM  Result Value Ref Range   Glucose-Capillary 156 (H) 65 - 99 mg/dL   Comment 1 Notify RN    Comment 2 Document in Chart    Dg Chest Port 1 View  12/08/2015  CLINICAL DATA:  Shortness of breath EXAM: PORTABLE CHEST 1 VIEW COMPARISON:  12/06/2015 FINDINGS: Chronic cardiomegaly. Biventricular pacer from the right with leads in stable position. Status post CABG. New hazy appearance at the right base with costophrenic sulcus blunting. No pneumothorax. Remote mid left clavicle fracture. IMPRESSION: New small right pleural effusion. Electronically Signed   By: Monte Fantasia M.D.   On: 12/08/2015 06:31    Assessment/Plan: Diagnosis: Deconditioning after GI bleed and fall 1. Does the need for close, 24 hr/day medical supervision in concert with the patient's rehab needs make it unreasonable for this patient to be served  in a less intensive setting? Yes 2. Co-Morbidities requiring supervision/potential complications: Acute on chronic systolic and diastolic congestive heart  failure, coronary artery disease, type 2 diabetes, uncontrolled 3. Due to bladder management, bowel management, safety, skin/wound care, disease management, medication administration, pain management and patient education, does the patient require 24 hr/day rehab nursing? Yes 4. Does the patient require coordinated care of a physician, rehab nurse, PT (1-2 hrs/day, 5 days/week) and OT (1-2 hrs/day, 5 days/week) to address physical and functional deficits in the context of the above medical diagnosis(es)? Yes Addressing deficits in the following areas: balance, endurance, locomotion, strength, transferring, bowel/bladder control, bathing, dressing, feeding, grooming, toileting and cognition 5. Can the patient actively participate in an intensive therapy program of at least 3 hrs of therapy per day at least 5 days per week? Yes 6. The potential for patient to make measurable gains while on inpatient rehab is excellent 7. Anticipated functional outcomes upon discharge from inpatient rehab are modified independent  with PT, modified independent with OT, n/a with SLP. 8. Estimated rehab length of stay to reach the above functional goals is: 7 days 9. Does the patient have adequate social supports and living environment to accommodate these discharge functional goals? Yes 10. Anticipated D/C setting: Home 11. Anticipated post D/C treatments: [place] therapy 12. Overall Rehab/Functional Prognosis: excellent  RECOMMENDATIONS: This patient's condition is appropriate for continued rehabilitative care in the following setting: CIR Patient has agreed to participate in recommended program. Yes Note that insurance prior authorization may be required for reimbursement for recommended care.  Comment: Wife is about 79 years younger she is able to help at home    12/09/2015

## 2015-12-09 NOTE — Care Management Note (Signed)
Case Management Note  Patient Details  Name: JOHNATAN LYNDON MRN: [id number] Date of Birth: September 07, 1924  Subjective/Objective:    Pt admitted GI bleed                Action/Plan:  PT is from home with wife.  Pt has history of falls.  CIR recommendation placed and CSW consulted for backup plan.  Pt is in agreement with both CIR and SNF.  CM will continue to monitor for disposition needs.   Expected Discharge Date:                  Expected Discharge Plan:  IP Rehab Facility  In-House Referral:  Clinical Social Work  Discharge planning Services  CM Consult  Post Acute Care Choice:    Choice offered to:     DME Arranged:    DME Agency:     HH Arranged:    [place] Agency:     Status of Service:  In process, will continue to follow  Medicare Important Message Given:    Date Medicare IM Given:    Medicare IM give by:    Date Additional Medicare IM Given:    Additional Medicare Important Message give by:     If discussed at [place] of Stay Meetings, dates discussed:    Additional Comments:  Maryclare Labrador, RN 12/09/2015, 10:46 AM

## 2015-12-09 NOTE — Progress Notes (Signed)
Physical Therapy Treatment Patient Details Name: [name] MRN: [id number] DOB: Apr 29, 1925 Today's Date: 12/09/2015    History of Present Illness [name] is a 82 y.o. man with past medical history of atrial fibrillation (on Coumadin), coronary artery disease s/p CABG, DM type 2, bladder cancer who presents to the emergency department with complaint of bright red blood per rectum. Required 7+ units of blood. GI performed cautery; RN reports pt has not slept in 3 nights with some confusion resulting    PT Comments    Patient reporting he feels worse today due to lack of sleep (x 4 nights). Reports he knows he is not thinking clearly. "I just wish they'd give me something to knock me out." Admits to repeated worrying about personal issues contributing to inability to sleep.  Willing to attempt PT as he desperately wants to get better. Performed orthostatic BPs (+ with supine to sit, but with correct response with sit to stand). Patient then stated he was just too fatigued and did not feel safe trying to walk.    Follow Up Recommendations  CIR (pt very motivated, however he may not be able to tolerate. Will continue to assess)     Equipment Recommendations   (TBA)    Recommendations for Other Services OT consult     Precautions / Restrictions Precautions Precautions: Fall Precaution Comments: fell gettting off toilet just prior to admission    Mobility  Bed Mobility Overal bed mobility: Needs Assistance Bed Mobility: Sidelying to Sit;Sit to Supine   Sidelying to sit: Min assist   Sit to supine: Supervision   General bed mobility comments: min assist with heavy use of rail, HOB elevated; reports of dizziness;   Transfers Overall transfer level: Needs assistance Equipment used:  (bedrail) Transfers: Sit to/from Stand Sit to Stand: Min assist         General transfer comment: Pt unsteady and reporting he did not feel safe letting go of bed  rail  Ambulation/Gait             General Gait Details: unable   Stairs            Wheelchair Mobility    Modified Rankin (Stroke Patients Only)       Balance     Sitting balance-Leahy Scale: Good       Standing balance-Leahy Scale: Poor                      Cognition Arousal/Alertness: Awake/alert Behavior During Therapy: Flat affect Overall Cognitive Status: Impaired/Different from baseline (per pt report, not evident with limited session)                      Exercises      General Comments        Pertinent Vitals/Pain Pain Assessment: No/denies pain    Home Living                      Prior Function            PT Goals (current goals can now be found in the care plan section) Acute Rehab PT Goals Patient Stated Goal: return straight home if able Time For Goal Achievement: 12/15/15 Progress towards PT goals: Not progressing toward goals - comment    Frequency  Min 3X/week    PT Plan Current plan remains appropriate    Co-evaluation  End of Session   Activity Tolerance: Patient limited by fatigue Patient left: in bed;with call bell/phone within reach;with bed alarm set (SCD leg wraps no longer in room)     Time: [id number] PT Time Calculation (min) (ACUTE ONLY): 15 min  Charges:  $Therapeutic Activity: 8-22 mins                    G Codes:      [name] 12-22-15, 12:33 PM Pager [phone]

## 2015-12-09 NOTE — Progress Notes (Signed)
[facility]                                                                              Patient Demographics  [name], is a 82 y.o. male, DOB - [birth year], OX:[id number]  Admit date - 12/04/2015   Admitting Physician Raylene Miyamoto, MD  Outpatient Primary MD for the patient is Donnajean Lopes, MD  Outpatient specialists:   LOS - 4  days    Chief Complaint  Patient presents with  . GI Bleeding       Brief summary   Pleasant 82 year old male with history of atrial fibrillation on Coumadin, CAD status post CABG, CHF, type 2 diabetes mellitus, bladder cancer who was admitted initially for GI bleed, bright red blood per rectum causing anemia requiring transfusion and ICU admission. He was seen by GI. He was found to have a supratherapeutic INR requiring FFP and vitamin K, underwent flexible sigmoidoscopy showing a small rectal ulcer which was bleeding likely caused by constipation.  He also developed acute renal failure, massive edema in upper extremities and generalized weakness. Transferred to the floor on 4/25 from ICU.   Assessment & Plan     Acute lower GI bleed associated anemia with Circulatory shock in the setting of Coumadin and supratherapeutic INR - Shock has resolved -  INR was reversed, Coumadin was placed on hold, received 6 units of packed RBCs in the ICU.  - H&H now stable.  -GI was consulted, patient underwent tagged RBC scan which showed GI bleeding source from sigmoid colon.  - Flexible sigmoidoscopy showed rectal ulcer with bleed which was cauterized, ulcer was likely caused by anal fissure/ constipation. - Per GI hold Coumadin for 2 weeks, 81 mg of aspirin, monitor H&H, avoid constipation.    Leukocytosis due to Escherichia coli UTI. - Patient was placed on IV Rocephin, discontinue Foley. Will continue for total 7 days   Acute on chronic systolic and diastolic CHF, NYHA class 3/ Ischemic cardiomyopathy - Started on IV Lasix 40 mg  twice a day, still +10.9 L  - Follow strict I's and O's and daily weights  - Cardiology decrease Lasix today.  - Obtain Doppler ultrasound of the upper extremities, right worse than left edema  - on low-dose Coreg, no ACE/ARB due to renal failure.   Chronic atrial fibrillation, complete heart block - [place] VASC 6, Coumadin currently held in the setting of GI bleed - Placed on low-dose aspirin as recommended by GI - Cardiology following. - Status post pacemaker in 2014  CAD. Status post CABG. Chest pain-free -  mild troponin bump likely demand ischemia from anemia - on low-dose aspirin, continue statin, low-dose beta blocker.    Dyslipidemia. Replaced on statin.  Essential hypertension. - ACE inhibitor on hold due to ARF, low-dose beta blocker and monitor.   Acute kidney injury -  likely due to shock from anemia, continue Lasix however dose decreased today due to bump in creatinine.     History of gout. On allopurinol.  Upper extremity edema - Obtain a Doppler ultrasound to rule out DVT, has been off Coumadin   Code Status:Full CODE STATUS  Family Communication: Discussed in detail with the patient, all imaging results, lab results explained to the patient    Disposition Plan: Possible CIR/SNF once cleared by cardiology   Time Spent in minutes 25 minutes  Procedures  Tagged RBC scan   flexible sigmoidoscopy  Consults   Cardiology   GI Inpatient rehabilitation  DVT Prophylaxis SCD's  Medications  Scheduled Meds: . allopurinol  300 mg Oral Daily  . aspirin  81 mg Oral Daily  . carvedilol  3.125 mg Oral BID WC  . cefTRIAXone (ROCEPHIN)  IV  1 g Intravenous Q24H  . digoxin  0.0625 mg Oral Daily  . docusate sodium  200 mg Oral BID  . feeding supplement (ENSURE ENLIVE)  237 mL Oral TID BM  . [START ON 12/10/2015] furosemide  40 mg Intravenous Daily  . heparin subcutaneous  5,000 Units Subcutaneous Q8H  . insulin aspart  0-5 Units Subcutaneous QHS  . insulin  aspart  0-9 Units Subcutaneous TID WC  . insulin aspart  0-9 Units Subcutaneous TID WC  . polyethylene glycol  17 g Oral Daily  . pravastatin  40 mg Oral q1800  . vitamin B-12  1,000 mcg Oral Daily   Continuous Infusions:  PRN Meds:.acetaminophen, ipratropium, ondansetron (ZOFRAN) IV, zolpidem   Antibiotics   Anti-infectives    Start     Dose/Rate Route Frequency Ordered Stop   12/08/15 1130  cefTRIAXone (ROCEPHIN) 1 g in dextrose 5 % 50 mL IVPB    Comments:  Give diphenhydramine 25mg  IV PRN allergic reaction to Ceftriaxone and Solumedrol 60mg  IV x1 prn if develops allergic reaction to Ceftriaxone and notify MD to Discontinue the ceftriaxone.   1 g 100 mL/hr over 30 Minutes Intravenous Every 24 hours 12/08/15 1002          Subjective:   [name] was seen and examined today.  Feeling wiped out, exhausted, has not been sleeping well. Edema noticed in upper extremities, right worse than left.  Patient denies dizziness, chest pain, shortness of breath, abdominal pain, N/V/D/C, new weakness, numbess, tingling.   Objective:   Filed Vitals:   12/08/15 2022 12/09/15 0443 12/09/15 0808 12/09/15 1152  BP: 110/58 128/73  111/50  Pulse: 89 89  90  Temp: 98.3 F (36.8 C) 98.5 F (36.9 C)  98.2 F (36.8 C)  TempSrc: Oral Oral  Oral  Resp: 18 18  18   Height:      Weight:   51.166 kg (112 lb 12.8 oz)   SpO2: 100% 99%  100%    Intake/Output Summary (Last 24 hours) at 12/09/15 1238 Last data filed at 12/09/15 1051  Gross per 24 hour  Intake    660 ml  Output   1652 ml  Net   -992 ml     Wt Readings from Last 3 Encounters:  12/09/15 51.166 kg (112 lb 12.8 oz)  04/07/15 58.877 kg (129 lb 12.8 oz)  06/29/14 57.7 kg (127 lb 3.3 oz)     Exam  General: Alert and oriented x 3, NAD, frail-appearing  HEENT:  PERRLA, EOMI, Anicteric Sclera, mucous membranes moist.   Neck: Supple, +JVD, no masses  CVS: S1 S2 auscultated, no rubs, murmurs or gallops. Regular rate and  rhythm.  Respiratory: Decreased breath sounds at the bases   Abdomen: Soft, nontender, nondistended, + bowel sounds  Ext: no cyanosis clubbing, + edema worse in the upper extremities, right >left  Neuro: no new deficits   Skin: No rashes  Psych: Normal affect and  demeanor, alert and oriented x3    Data Reviewed:  I have personally reviewed following labs and imaging studies  Micro Results Recent Results (from the past 240 hour(s))  Urine culture     Status: Abnormal   Collection Time: 12/05/15 12:55 AM  Result Value Ref Range Status   Specimen Description URINE, CATHETERIZED  Final   Special Requests NONE  Final   Culture >=100,000 COLONIES/mL ESCHERICHIA COLI (A)  Final   Report Status 12/07/2015 FINAL  Final   Organism ID, Bacteria ESCHERICHIA COLI (A)  Final      Susceptibility   Escherichia coli - MIC*    AMPICILLIN >=32 RESISTANT Resistant     CEFAZOLIN <=4 SENSITIVE Sensitive     CEFTRIAXONE <=1 SENSITIVE Sensitive     CIPROFLOXACIN <=0.25 SENSITIVE Sensitive     GENTAMICIN <=1 SENSITIVE Sensitive     IMIPENEM <=0.25 SENSITIVE Sensitive     NITROFURANTOIN <=16 SENSITIVE Sensitive     TRIMETH/SULFA <=20 SENSITIVE Sensitive     AMPICILLIN/SULBACTAM >=32 RESISTANT Resistant     PIP/TAZO <=4 SENSITIVE Sensitive     * >=100,000 COLONIES/mL ESCHERICHIA COLI  MRSA PCR Screening     Status: None   Collection Time: 12/05/15  5:45 AM  Result Value Ref Range Status   MRSA by PCR NEGATIVE NEGATIVE Final    Comment:        The GeneXpert MRSA Assay (FDA approved for NASAL specimens only), is one component of a comprehensive MRSA colonization surveillance program. It is not intended to diagnose MRSA infection nor to guide or monitor treatment for MRSA infections.     Radiology Reports Ct Abdomen Pelvis Wo Contrast  11/27/2015  CLINICAL DATA:  Acute onset of urinary retention and constipation. Rectal burning. Initial encounter. EXAM: CT ABDOMEN AND PELVIS WITHOUT  CONTRAST TECHNIQUE: Multidetector CT imaging of the abdomen and pelvis was performed following the standard protocol without IV contrast. COMPARISON:  CT of the abdomen and pelvis from 10/15/2015 FINDINGS: A trace right-sided pleural effusion is noted, with mild right basilar atelectasis. Pacemaker leads are partially imaged. The liver and spleen are unremarkable in appearance. The gallbladder is within normal limits. The pancreas and adrenal glands are unremarkable. Scattered right renal cysts are seen, one of which demonstrates some peripheral calcification. Nonspecific perinephric stranding is noted bilaterally. There is no evidence of hydronephrosis. No renal or ureteral stones are identified. No free fluid is identified. The small bowel is unremarkable in appearance. The stomach is within normal limits. No acute vascular abnormalities are seen. Scattered calcification is seen along the abdominal aorta and its branches, particularly prominent along the common iliac arteries bilaterally. The appendix is normal in caliber, without evidence of appendicitis. Scattered diverticulosis is noted along the descending and sigmoid colon, without evidence of diverticulitis. The rectum is largely filled with stool, measuring 7.0 cm in transverse dimension. The bladder is mildly distended, with a Foley catheter in place. The prostate remains normal in size. No inguinal lymphadenopathy is seen. No acute osseous abnormalities are identified. Chronic bilateral pars defects are seen at L5. IMPRESSION: 1. Rectum largely filled with stool, measuring 7.0 cm in transverse dimension, raising concern for mild fecal impaction. Would correlate with the patient's symptoms. The remainder of the colon is largely filled with stool. 2. Scattered diverticulosis along the descending and sigmoid colon, without evidence of diverticulitis. 3. Scattered right renal cysts, one of which demonstrates some peripheral calcification. Nonspecific  bilateral perinephric stranding is grossly stable. 4. Previously noted ascites has resolved.  5. Residual trace right pleural effusion, with mild right basilar atelectasis. 6. Scattered calcification along the abdominal aorta and its branches, particularly prominent along the common iliac arteries bilaterally. 7. Chronic bilateral pars defects at L5, without evidence of anterolisthesis. Electronically Signed   By: Garald Balding M.D.   On: 11/27/2015 21:56   Ct Head Wo Contrast  11/24/2015  CLINICAL DATA:  82 year old male who fell while walking to the male box. Struck his left head and face on concrete. Loss of consciousness. Headache. On blood thinners. Initial encounter. EXAM: CT HEAD WITHOUT CONTRAST CT MAXILLOFACIAL WITHOUT CONTRAST TECHNIQUE: Multidetector CT imaging of the head and maxillofacial structures were performed using the standard protocol without intravenous contrast. Multiplanar CT image reconstructions of the maxillofacial structures were also generated. COMPARISON:  None available. FINDINGS: CT HEAD FINDINGS Face findings are described below. No scalp hematoma identified. Calvarium intact. Cerebral volume loss. Patchy bilateral cerebral white matter hypodensity. Small chronic appearing lacunar infarcts in the cerebellum and right thalamus. Small area of cortical encephalomalacia in the right MCA territory of the superior right frontal gyrus series 2, image 25. No midline shift, mass effect, or evidence of intracranial mass lesion. No acute intracranial hemorrhage identified. No areas of acute cortically based infarction identified. CT MAXILLOFACIAL FINDINGS Negative visualized nonco Calcified atherosclerosis at the skull base. ntrast brain parenchyma aside from cerebral volume loss. Calcified atherosclerosis at the skull base. No scalp hematoma identified. Postoperative changes to both globes. No acute orbit soft tissue abnormality identified. There is a left face subcutaneous contusion or  hematoma on series 4, image 41, at an just below the zygoma. There is mild abnormal retro maxillary soft tissue density (series 4, image 53). Other visualized noncontrast deep soft tissue spaces of the face remain normal. The mandible remains intact. There is a mildly comminuted fracture through the posterior superior wall of the left maxillary sinus (series 5, image 57). Hyperdensity throughout the left maxillary sinus therefore may be hemorrhage. The other paranasal sinuses remain clear. There is a nondisplaced fracture of the left zygomatic arch (image 54). There is a mildly comminuted fracture of the lateral wall of the left orbit. The left orbital floor appears to remain intact. No nasal bone fracture identified. No contralateral right face fractures identified. Advanced degenerative changes in the visualized cervical spine with multilevel spondylolisthesis. IMPRESSION: 1. Mildly comminuted fractures of the left maxillary sinus posterior wall, left orbit lateral wall, left zygomatic arch. Overlying soft tissue hematoma. Hemorrhage in the left maxillary sinus. 2. No other acute facial fracture. 3. Chronic small and medium-sized vessel ischemia in the brain with no acute intracranial abnormality. Electronically Signed   By: Genevie Ann M.D.   On: 11/24/2015 14:52   Nm Gi Blood Loss  12/05/2015  CLINICAL DATA:  Bright red blood per rectum. Patient actively bleeding. Gastrointestinal bleeding K92.2 (ICD-10-CM) Gastrointestinal hemorrhage, unspecified gastritis, unspecified gastrointestinal hemorrhage type K92.2 (ICD-10-CM) EXAM: NUCLEAR MEDICINE GASTROINTESTINAL BLEEDING SCAN TECHNIQUE: Sequential abdominal images were obtained following intravenous administration of Tc-27m labeled red blood cells. RADIOPHARMACEUTICALS:  25.2 mCi Tc-65m in-vitro labeled red cells. COMPARISON:  CT, 11/27/2015 FINDINGS: There is a small elongated focus of uptake in the right central pelvis, adjacent to the right dome of the bladder  and right common iliac artery, which becomes apparent approximately 20 minutes following the injection of radiotracer, fading later during the examination. Although somewhat equivocal, this suggests a gastro intestinal bleeding site from the sigmoid colon. There is no other evidence of a GI bleeding source. No  other abnormal radiotracer accumulation. IMPRESSION: 1. Small area of abnormal radiotracer accumulation in right central pelvis suggesting a GI bleeding source from the sigmoid colon. Electronically Signed   By: Lajean Manes M.D.   On: 12/05/2015 14:51   Dg Chest Port 1 View  12/08/2015  CLINICAL DATA:  Shortness of breath EXAM: PORTABLE CHEST 1 VIEW COMPARISON:  12/06/2015 FINDINGS: Chronic cardiomegaly. Biventricular pacer from the right with leads in stable position. Status post CABG. New hazy appearance at the right base with costophrenic sulcus blunting. No pneumothorax. Remote mid left clavicle fracture. IMPRESSION: New small right pleural effusion. Electronically Signed   By: Monte Fantasia M.D.   On: 12/08/2015 06:31   Dg Chest Port 1 View  12/06/2015  CLINICAL DATA:  CHF EXAM: PORTABLE CHEST 1 VIEW COMPARISON:  12/04/2015 chest radiograph. FINDINGS: Sternotomy wires appear aligned and intact. Stable configuration of 2 lead right subclavian pacemaker with lead tips overlying the coronary sinus and right ventricle. CABG clips overlie the mediastinum. Stable cardiomediastinal silhouette with normal heart size. No pneumothorax. No pleural effusion. Lungs appear clear, with no acute consolidative airspace disease and no pulmonary edema. IMPRESSION: No active disease. Electronically Signed   By: Ilona Sorrel M.D.   On: 12/06/2015 14:27   Dg Chest Port 1 View  12/04/2015  CLINICAL DATA:  Altered mental status.  GI bleed. EXAM: PORTABLE CHEST 1 VIEW COMPARISON:  11/27/2015 FINDINGS: Cardiac pacemaker. Postoperative changes in the mediastinum. Shallow inspiration. Heart size and pulmonary  vascularity are normal. No focal airspace disease or consolidation in the lungs. No blunting of costophrenic angles. No pneumothorax. Old left rib fractures. Old left clavicular fracture. IMPRESSION: No active disease. Electronically Signed   By: Lucienne Capers M.D.   On: 12/04/2015 23:57   Dg Abd Acute W/chest  11/27/2015  CLINICAL DATA:  Burning sensations in the rectal region and constipation with urine blockage. EXAM: DG ABDOMEN ACUTE W/ 1V CHEST COMPARISON:  Previous examinations. FINDINGS: The heart remains grossly normal in size. Stable post CABG changes and right subclavian pacemaker leads. Clear lungs with normal vascularity. Normal bowel gas pattern without free peritoneal air. Prominent stool throughout the colon. Mild scoliosis. Mild lumbar spine degenerative changes. Atheromatous arterial calcifications. IMPRESSION: No acute abnormality.  Prominent stool. Electronically Signed   By: Claudie Revering M.D.   On: 11/27/2015 18:22   Ct Maxillofacial Wo Cm  11/24/2015  CLINICAL DATA:  82 year old male who fell while walking to the male box. Struck his left head and face on concrete. Loss of consciousness. Headache. On blood thinners. Initial encounter. EXAM: CT HEAD WITHOUT CONTRAST CT MAXILLOFACIAL WITHOUT CONTRAST TECHNIQUE: Multidetector CT imaging of the head and maxillofacial structures were performed using the standard protocol without intravenous contrast. Multiplanar CT image reconstructions of the maxillofacial structures were also generated. COMPARISON:  None available. FINDINGS: CT HEAD FINDINGS Face findings are described below. No scalp hematoma identified. Calvarium intact. Cerebral volume loss. Patchy bilateral cerebral white matter hypodensity. Small chronic appearing lacunar infarcts in the cerebellum and right thalamus. Small area of cortical encephalomalacia in the right MCA territory of the superior right frontal gyrus series 2, image 25. No midline shift, mass effect, or evidence of  intracranial mass lesion. No acute intracranial hemorrhage identified. No areas of acute cortically based infarction identified. CT MAXILLOFACIAL FINDINGS Negative visualized nonco Calcified atherosclerosis at the skull base. ntrast brain parenchyma aside from cerebral volume loss. Calcified atherosclerosis at the skull base. No scalp hematoma identified. Postoperative changes to both globes. No acute  orbit soft tissue abnormality identified. There is a left face subcutaneous contusion or hematoma on series 4, image 41, at an just below the zygoma. There is mild abnormal retro maxillary soft tissue density (series 4, image 53). Other visualized noncontrast deep soft tissue spaces of the face remain normal. The mandible remains intact. There is a mildly comminuted fracture through the posterior superior wall of the left maxillary sinus (series 5, image 57). Hyperdensity throughout the left maxillary sinus therefore may be hemorrhage. The other paranasal sinuses remain clear. There is a nondisplaced fracture of the left zygomatic arch (image 54). There is a mildly comminuted fracture of the lateral wall of the left orbit. The left orbital floor appears to remain intact. No nasal bone fracture identified. No contralateral right face fractures identified. Advanced degenerative changes in the visualized cervical spine with multilevel spondylolisthesis. IMPRESSION: 1. Mildly comminuted fractures of the left maxillary sinus posterior wall, left orbit lateral wall, left zygomatic arch. Overlying soft tissue hematoma. Hemorrhage in the left maxillary sinus. 2. No other acute facial fracture. 3. Chronic small and medium-sized vessel ischemia in the brain with no acute intracranial abnormality. Electronically Signed   By: Genevie Ann M.D.   On: 11/24/2015 14:52    CBC  Recent Labs Lab 12/04/15 2350  12/05/15 0055  12/05/15 1002  12/06/15 2342 12/07/15 0529 12/07/15 1812 12/08/15 0425 12/09/15 0453  WBC 14.3*  --   15.8*  < > 13.6*  < > 20.3* 19.5* 18.9* 21.8* 19.9*  HGB 13.1  < > 11.2*  < > 6.9*  < > 8.0* 8.2* 7.8* 8.0* 8.3*  HCT 38.0*  < > 31.3*  < > 20.7*  < > 23.5* 23.6* 22.8* 23.3* 24.1*  PLT 221  --  198  < > 81*  < > 74* 78* 89* 106* 130*  MCV 95.0  --  96.3  < > 93.2  < > 86.4 86.1 85.7 86.0 88.3  MCH 32.8  --  34.5*  < > 31.1  < > 29.4 29.9 29.3 29.5 30.4  MCHC 34.5  --  35.8  < > 33.3  < > 34.0 34.7 34.2 34.3 34.4  RDW 14.2  --  14.5  < > 14.5  < > 14.2 14.9 14.8 15.2 15.1  LYMPHSABS 2.2  --  1.4  --  0.5*  --   --   --   --   --   --   MONOABS 1.2*  --  1.1*  --  1.2*  --   --   --   --   --   --   EOSABS 0.2  --  0.2  --  0.0  --   --   --   --   --   --   BASOSABS 0.1  --  0.0  --  0.0  --   --   --   --   --   --   < > = values in this interval not displayed.  Chemistries   Recent Labs Lab 12/04/15 2350  12/05/15 1002 12/05/15 1520 12/06/15 0518 12/07/15 0529 12/08/15 0913 12/09/15 0453  NA 124*  < > 136 136 130* 132* 129* 128*  K 5.2*  < > 4.7 4.6 5.3* 5.1 5.0 5.0  CL 89*  < > 107 109 105 104 104 100*  CO2 20*  < > 15* 12* 17* 19* 19* 18*  GLUCOSE 288*  < > 203* 112* 94 105* 98 153*  BUN  97*  < > 83* 78* 74* 61* 54* 60*  CREATININE 2.43*  < > 2.11* 1.89* 1.79* 1.46* 1.36* 1.74*  CALCIUM 8.6*  < > 7.1* 6.8* 6.5* 7.4* 7.7* 7.7*  MG 1.8  --  1.7  --  1.4*  --   --   --   AST 44*  --   --   --   --   --   --   --   ALT 45  --   --   --   --   --   --   --   ALKPHOS 134*  --   --   --   --   --   --   --   BILITOT 1.1  --   --   --   --   --   --   --   < > = values in this interval not displayed. ------------------------------------------------------------------------------------------------------------------ estimated creatinine clearance is 21.3 mL/min (by C-G formula based on Cr of 1.74). ------------------------------------------------------------------------------------------------------------------  Recent Labs  12/08/15 0917  HGBA1C 5.9*    ------------------------------------------------------------------------------------------------------------------ No results for input(s): CHOL, HDL, LDLCALC, TRIG, CHOLHDL, LDLDIRECT in the last 72 hours. ------------------------------------------------------------------------------------------------------------------ No results for input(s): TSH, T4TOTAL, T3FREE, THYROIDAB in the last 72 hours.  Invalid input(s): FREET3 ------------------------------------------------------------------------------------------------------------------ No results for input(s): VITAMINB12, FOLATE, FERRITIN, TIBC, IRON, RETICCTPCT in the last 72 hours.  Coagulation profile  Recent Labs Lab 12/05/15 1843 12/06/15 1147 12/07/15 0529 12/08/15 0425 12/09/15 0453  INR 2.31* 2.05* 1.79* 1.86* 1.65*    No results for input(s): DDIMER in the last 72 hours.  Cardiac Enzymes  Recent Labs Lab 12/05/15 1002 12/05/15 1520 12/05/15 1843  TROPONINI 0.10* 0.13* 0.20*   ------------------------------------------------------------------------------------------------------------------ Invalid input(s): POCBNP   Recent Labs  12/07/15 2355 12/08/15 0626 12/08/15 1630 12/09/15 0529 12/09/15 0906 12/09/15 1151  GLUCAP 179* 98 151* 156* 123* 131*     RAI,RIPUDEEP M.D. [facility] 12/09/2015, 12:38 PM  Pager: [phone] Between 7am to 7pm - call Pager - [phone]  After 7pm go to [email or web address] - password [email or web address]  Call night coverage person covering after 7pm

## 2015-12-09 NOTE — Progress Notes (Signed)
Hospital Problem List     Principal Problem:   Shock circulatory ([place]) Active Problems:   GI bleed   Hemorrhagic shock   Acute lower GI bleeding   Rectal/anal ulcer   Lactic acidosis   Bleeding gastrointestinal   Coagulopathy (HCC)   Acute blood loss anemia   CKD (chronic kidney disease), stage III   Acute on chronic systolic and diastolic heart failure, NYHA class 3 ([place])    Patient Profile:   Primary Cardiologist: Dr. Lovena Le  82 y.o male w/ PMH of ICM (s/p CABG in 2004, EF 30%), CHB (s/p MDT PPM), CKD III, S-CHF, HTN, HLD, PAF (on coumadin), GERD, DM2, and bladder CA admitted on 12/04/2015 for GI Bleed. Cards consulted on 12/08/2015 for GI bleed.  Subjective   Reports trouble sleeping over the past few nights. Denies any chest pain or palpitations. Concerned about swelling along his right arm.  Inpatient Medications    . allopurinol  300 mg Oral Daily  . aspirin  81 mg Oral Daily  . carvedilol  3.125 mg Oral BID WC  . cefTRIAXone (ROCEPHIN)  IV  1 g Intravenous Q24H  . digoxin  0.0625 mg Oral Daily  . docusate sodium  200 mg Oral BID  . feeding supplement (ENSURE ENLIVE)  237 mL Oral TID BM  . furosemide  40 mg Intravenous BID  . heparin subcutaneous  5,000 Units Subcutaneous Q8H  . insulin aspart  0-5 Units Subcutaneous QHS  . insulin aspart  0-9 Units Subcutaneous TID WC  . insulin aspart  0-9 Units Subcutaneous TID WC  . polyethylene glycol  17 g Oral Daily  . pravastatin  40 mg Oral q1800  . vitamin B-12  1,000 mcg Oral Daily    Vital Signs    Filed Vitals:   12/08/15 1704 12/08/15 2022 12/09/15 0443 12/09/15 0808  BP: 109/56 110/58 128/73   Pulse: 89 89 89   Temp: 98 F (36.7 C) 98.3 F (36.8 C) 98.5 F (36.9 C)   TempSrc: Oral Oral Oral   Resp: 18 18 18    Height:      Weight:    112 lb 12.8 oz (51.166 kg)  SpO2: 100% 100% 99%     Intake/Output Summary (Last 24 hours) at 12/09/15 1032 Last data filed at 12/09/15 [id number]  Gross per 24 hour    Intake    660 ml  Output   1326 ml  Net   -666 ml   Filed Weights   12/07/15 2051 12/08/15 0450 12/09/15 0808  Weight: 118 lb 4.8 oz (53.661 kg) 120 lb 3.2 oz (54.522 kg) 112 lb 12.8 oz (51.166 kg)    Physical Exam    General: Thin-appearing, elderly Caucasian male appearing in no acute distress. Head: Normocephalic, atraumatic.  Neck: Supple without bruits, JVD elevated to 9cm. Lungs:  Resp regular and unlabored, minimal rales at bases bilaterally. Heart: RRR, S1, S2, no S3, S4, or murmur; no rub. Abdomen: Soft, non-tender, non-distended with normoactive bowel sounds. No hepatomegaly. No rebound/guarding. No obvious abdominal masses. Extremities: No clubbing, cyanosis, or lower extremity edema. Still significant swelling along right upper extremity. Distal pedal pulses are 2+ bilaterally. Neuro: Alert and oriented X 3. Moves all extremities spontaneously. Psych: Normal affect.  Labs    CBC  Recent Labs  12/08/15 0425 12/09/15 0453  WBC 21.8* 19.9*  HGB 8.0* 8.3*  HCT 23.3* 24.1*  MCV 86.0 88.3  PLT 106* [id number]*   Basic Metabolic Panel  Recent Labs  12/08/15 0913 12/09/15 0453  NA 129* 128*  K 5.0 5.0  CL 104 100*  CO2 19* 18*  GLUCOSE 98 153*  BUN 54* 60*  CREATININE 1.36* 1.74*  CALCIUM 7.7* 7.7*   Hemoglobin A1C  Recent Labs  12/08/15 0917  HGBA1C 5.9*    Telemetry    V-paced, HR in 80's. No atopic events.  ECG    No new tracings.   Cardiac Studies and Radiology    Dg Chest Port 1 View: 12/08/2015  CLINICAL DATA:  Shortness of breath EXAM: PORTABLE CHEST 1 VIEW COMPARISON:  12/06/2015 FINDINGS: Chronic cardiomegaly. Biventricular pacer from the right with leads in stable position. Status post CABG. New hazy appearance at the right base with costophrenic sulcus blunting. No pneumothorax. Remote mid left clavicle fracture. IMPRESSION: New small right pleural effusion. Electronically Signed   By: Monte Fantasia M.D.   On: 12/08/2015 06:31    Echocardiogram: 12/07/2015 Study Conclusions - Left ventricle: The cavity size was normal. Wall thickness was  normal. Systolic function was moderately to severely reduced. The  estimated ejection fraction was in the range of 30% to 35%. There  is akinesis of the anterior, inferolateral, and apical  myocardium. The study is not technically sufficient to allow  evaluation of LV diastolic function. - Aortic valve: Valve mobility was restricted. There was mild to  moderate stenosis. There was trivial regurgitation. Valve area  (VTI): 1.48 cm^2. Valve area (Vmax): 1.41 cm^2. Valve area  (Vmean): 1.34 cm^2. - Mitral valve: There was mild regurgitation. - Left atrium: The atrium was mildly dilated. - Tricuspid valve: There was moderate regurgitation. - Pulmonary arteries: Systolic pressure was moderately to severely  increased. PA peak pressure: 64 mm Hg (S).  Impressions: - Akinesis of the inferolateral wall, anterior wall and apex;  overall moderate to severe reduction in LV function; calcified  aortic valve with mild to moderate AS by continuity equation;  mild MR; mild LAE; moderate TR; moderate to severe pulmonary  hypertension.  Assessment & Plan    1. Acute on chronic systolic and diastolic heart failure, NYHA Class 3  - Started on IV Lasix 40mg  BID.  Is overall net positive + 10.9L this admission. Diuresed -1.5L yesterday. - Follow I/O, BMET, daily weights. - Creatinine 1.36 on 12/08/2015, bumped to 1.74 on 12/09/2015. Will decrease Lasix from 40mg  BID to once daily.   2.Acute Lower GI bleed - rectal bleed, s/p cautery - bowel regimen per GI. - GI has signed off, ok to add ASA 81 mg and restart Coumadin in several weeks.  3. Paroxysmal Atrial FIbrillation - none seen on last device interrogation - This patients CHA2DS2-VASc Score and unadjusted Ischemic Stroke Rate (% per year) is equal to 9.7 % stroke rate/year from a score of 6 (CHF, HTN, DM, Vascular, Age  (2)). Coumadin held in setting of GI bleed.  4. Complete Heart Block - s/p PPM in 03/2013  5. Right Upper Extremity Swelling - reports this has been getting worse for the past several days. - was on Coumadin for atrial fibrillation and has been on DVT prophylaxis with Heparin. With unilateral swelling there is concern for clot, but with anticoagulation this is less likely. Continue to monitor response with diuresis. - per admitting team.  Signed, Erma Heritage , PA-C 10:32 AM 12/09/2015 Pager: [phone]  I have personally seen and examined this patient with Bernerd Pho, PA-C. I agree with the assessment and plan as outlined above. I think his volume status is near  baseline. Agree with Lowering Lasix to once daily today and likely changed to po tomorrow. Right arm is swollen. Would arrange venous doppler to exclude DVT. No other recs today.   MCALHANY,CHRISTOPHER 12/09/2015 11:32 AM

## 2015-12-10 ENCOUNTER — Inpatient Hospital Stay (HOSPITAL_COMMUNITY): Payer: Medicare Other

## 2015-12-10 DIAGNOSIS — I82409 Acute embolism and thrombosis of unspecified deep veins of unspecified lower extremity: Secondary | ICD-10-CM

## 2015-12-10 LAB — GLUCOSE, CAPILLARY
GLUCOSE-CAPILLARY: 124 mg/dL — AB (ref 65–99)
Glucose-Capillary: 162 mg/dL — ABNORMAL HIGH (ref 65–99)
Glucose-Capillary: 204 mg/dL — ABNORMAL HIGH (ref 65–99)
Glucose-Capillary: 176 mg/dL — ABNORMAL HIGH (ref 65–99)

## 2015-12-10 LAB — BASIC METABOLIC PANEL
ANION GAP: 11 (ref 5–15)
BUN: 64 mg/dL — AB (ref 6–20)
CHLORIDE: 101 mmol/L (ref 101–111)
CO2: 21 mmol/L — AB (ref 22–32)
Calcium: 7.8 mg/dL — ABNORMAL LOW (ref 8.9–10.3)
Creatinine, Ser: 1.53 mg/dL — ABNORMAL HIGH (ref 0.61–1.24)
GFR calc Af Amer: 45 mL/min — ABNORMAL LOW (ref >60)
GFR calc non Af Amer: 39 mL/min — ABNORMAL LOW (ref >60)
GLUCOSE: 203 mg/dL — AB (ref 65–99)
POTASSIUM: 4.7 mmol/L (ref 3.5–5.1)
Sodium: 133 mmol/L — ABNORMAL LOW (ref 135–145)

## 2015-12-10 LAB — CBC
HEMATOCRIT: 25.7 % — AB (ref 39.0–52.0)
HEMOGLOBIN: 8.6 g/dL — AB (ref 13.0–17.0)
MCH: 29.9 pg (ref 26.0–34.0)
MCHC: 33.5 g/dL (ref 30.0–36.0)
MCV: 89.2 fL (ref 78.0–100.0)
PLATELETS: 192 10*3/uL (ref 150–400)
RBC: 2.88 MIL/uL — AB (ref 4.22–5.81)
RDW: 15.3 % (ref 11.5–15.5)
WBC: 20.2 10*3/uL — AB (ref 4.0–10.5)

## 2015-12-10 LAB — PROTIME-INR
INR: 1.66 — ABNORMAL HIGH (ref 0.00–1.49)
Prothrombin Time: 19.6 seconds — ABNORMAL HIGH (ref 11.6–15.2)

## 2015-12-10 MED ORDER — SPIRONOLACTONE 25 MG PO TABS
25.0000 mg | ORAL_TABLET | Freq: Every day | ORAL | Status: DC
  Administered 2015-12-10 – 2015-12-11 (×2): 25 mg via ORAL
  Filled 2015-12-10 (×2): qty 1

## 2015-12-10 MED ORDER — FUROSEMIDE 80 MG PO TABS
80.0000 mg | ORAL_TABLET | Freq: Every day | ORAL | Status: DC
  Administered 2015-12-10 – 2015-12-11 (×2): 80 mg via ORAL
  Filled 2015-12-10 (×2): qty 1

## 2015-12-10 NOTE — Clinical Social Work Note (Signed)
Clinical Social Work Assessment  Patient Details  Name: [name] MRN: [id number] Date of Birth: 1924-12-04  Date of referral:  12/10/15               Reason for consult:  Facility Placement, Discharge Planning                Permission sought to share information with:  Facility Sport and exercise psychologist, Family Supports, Case Manager Permission granted to share information::  Yes, Verbal Permission Granted  Name::     [name]  Agency::  SNF's  Relationship::  Spouse  Contact Information:  [phone]  Housing/Transportation Living arrangements for the past 2 months:  [place] of Information:  Patient, Spouse, Medical Team Patient Interpreter Needed:  None Criminal Activity/Legal Involvement Pertinent to Current Situation/Hospitalization:  No - Comment as needed Significant Relationships:  Spouse Lives with:  Spouse Do you feel safe going back to the place where you live?  Yes Need for family participation in patient care:  Yes (Comment)  Care giving concerns:  PT recommends CIR. If CIR does not have a bed available, patient will need SNF backup.   Social Worker assessment / plan:  CSW met with patient. No supports at bedside. CSW introduced role and explained that discharge planning would be discussed. Patient wanted CSW to call his wife about discharge. CSW contact patient's wife, [phone]. Patient's wife prefers [facility], and [facility] per personal referrals. CSW explained bed offers would be sent to other facilities as well as a back up to those. CSW asked what patient needs if he goes to the facility. CSW encouraged patient's wife to call patient's nurse. CSW told patient's wife that CIR admissions would be back to see patient around 3:00 to make final determination. CSW will notify wife of recommendations. No further concerns. CSW will continue to follow patient for support and facilitate discharge to SNF if needed once medically  stable.  Employment status:  Retired Forensic scientist:  Medicare PT Recommendations:  Inpatient [place] / Referral to community resources:  [place]  Patient/Family's Response to care:  Patient and his wife agreeable to CIR or SNF placement. Patient's wife involved in patient's care and supportive. Patient and his wife polite and appreciated social work intervention.  Patient/Family's Understanding of and Emotional Response to Diagnosis, Current Treatment, and Prognosis:  Patient's wife knowledgeable of medical interventions and aware of possible discharge today 4/27 to either CIR or SNF.  Emotional Assessment Appearance:  Appears stated age Attitude/Demeanor/Rapport:   (Calm, cooperative.) Affect (typically observed):  Calm, Pleasant Orientation:  Oriented to Self, Oriented to Place, Oriented to  Time, Oriented to Situation Alcohol / Substance use:  Never Used Psych involvement (Current and /or in the community):  No (Comment)  Discharge Needs  Concerns to be addressed:  Care Coordination (CIR or SNF) Readmission within the last 30 days:  No Current discharge risk:  Dependent with Mobility, Cognitively Impaired Barriers to Discharge:  No Barriers Identified   Candie Chroman, LCSW 12/10/2015, 12:40 PM

## 2015-12-10 NOTE — Care Management Important Message (Signed)
Important Message  Patient Details  Name: JOHNATHN SCHERZER MRN: [id number] Date of Birth: 1925/01/04   Medicare Important Message Given:  Yes    Lanelle Lindo P Aseret Hoffman 12/10/2015, 3:03 PM

## 2015-12-10 NOTE — Progress Notes (Signed)
Rehab admissions - I met with patient and I spoke to his wife by phone.  Wife says patient's memory is impaired.  Wife prefers inpatient rehab if I can get an open bed today.  Wife has looked at SNFs and mentioned [facility] and [facility].  I should know later today if I can get a bed today or not.  I will notify all then.  Call me for questions.  #[phone]

## 2015-12-10 NOTE — Consult Note (Signed)
   [facility] CM Inpatient Consult   12/10/2015  [name] 07-26-1925 [id number] Patient screened for potential [facility] services. Patient is eligible for [facility] through patient's Medicare benefits. Chart review reveals patient's discharge plan is for a skilled facility for rehab.  Patient had previously been contacted by [facility] services were declined at that time.  Met with the patient and his wife at the bedside to talk about eligible services when he returns home.  Both patient and wife [name] appreciated the information.  She states no [facility] care management needs at this time. She did accept the brochure and contact information.  Inpatient RNCM updated on outreach. For questions please contact:   Natividad Brood, RN BSN [facility] Liaison  [phone] business mobile phone Toll free office [phone]

## 2015-12-10 NOTE — Progress Notes (Signed)
Physical Therapy Treatment Patient Details Name: [name] MRN: [id number] DOB: [birth year] Today's Date: 12/10/2015    History of Present Illness [name] is a 82 y.o. man with past medical history of atrial fibrillation (on Coumadin), coronary artery disease s/p CABG, DM type 2, bladder cancer who presents to the emergency department with complaint of bright red blood per rectum. Required 7+ units of blood. GI performed cautery RN reports pt has not slept in 3 nights with some confusion resulting    PT Comments    Patient able to tolerate mobility better today (however still not as well as 4/25). Today he is confused (only oriented to self). Initially could not recall information provided after 1 minute passed. By end of session, was able to state he was in [place] [facility] and recognize his wife. RN made aware.   Follow Up Recommendations  CIR     Equipment Recommendations   (TBA)    Recommendations for Other Services OT consult     Precautions / Restrictions Precautions Precautions: Fall Precaution Comments: fell gettting off toilet just prior to admission    Mobility  Bed Mobility Overal bed mobility: Needs Assistance Bed Mobility: Sidelying to Sit;Rolling Rolling: Min assist Sidelying to sit: Min assist       General bed mobility comments: heavy use of rail  Transfers Overall transfer level: Needs assistance Equipment used: Rolling walker (2 wheeled) Transfers: Sit to/from Stand Sit to Stand: Min assist         General transfer comment: x 3 (various surfaces) reports some dizziness that resolved after 15 seconds  Ambulation/Gait Ambulation/Gait assistance: Min assist Ambulation Distance (Feet): 10 Feet Assistive device: Rolling walker (2 wheeled) (holding IV pole) Gait Pattern/deviations: Step-through pattern;Decreased stance time - left;Shuffle   Gait velocity interpretation: Below normal speed for age/gender General Gait  Details: required assist to guide/steer RW around end of bed   Stairs            Wheelchair Mobility    Modified Rankin (Stroke Patients Only)       Balance     Sitting balance-Leahy Scale: Good       Standing balance-Leahy Scale: Poor                      Cognition Arousal/Alertness: Awake/alert Behavior During Therapy: WFL for tasks assessed/performed Overall Cognitive Status: Impaired/Different from baseline Area of Impairment: Memory;Following commands;Safety/judgement;Problem solving;Orientation;Awareness Orientation Level: Place;Time;Situation (did not recognize his wife when she arrived)   Memory: Decreased short-term memory;Decreased recall of precautions Following Commands: Follows one step commands with increased time Safety/Judgement: Decreased awareness of safety;Decreased awareness of deficits Awareness:  (pre-intellectual) Problem Solving: Slow processing;Decreased initiation;Requires verbal cues;Requires tactile cues      Exercises      General Comments General comments (skin integrity, edema, etc.): Wife arrived and very upset re: needing to "make such a quick decision on where he is going!" Attempted to answer her questions, but ultimately deferred her questions to the SW or MD. As leaving [facility], Lamy arrived.      Pertinent Vitals/Pain Pain Assessment: No/denies pain    Home Living                      Prior Function            PT Goals (current goals can now be found in the care plan section) Acute Rehab PT Goals Patient Stated Goal: return straight home  if able Time For Goal Achievement: 12/15/15 Progress towards PT goals: Not progressing toward goals - comment (better than 4/26, but overall not progressing)    Frequency  Min 3X/week    PT Plan Current plan remains appropriate    Co-evaluation             End of Session Equipment Utilized During Treatment: Gait belt Activity Tolerance: Patient limited  by fatigue Patient left: in chair;with call bell/phone within reach;with chair alarm set;with family/visitor present     Time: [id number] PT Time Calculation (min) (ACUTE ONLY): 27 min  Charges:  $Gait Training: 8-22 mins $Therapeutic Activity: 8-22 mins                    G Codes:      Geraldin Habermehl 01/01/16, 3:36 PM  Pager [phone]

## 2015-12-10 NOTE — Progress Notes (Signed)
Hospital Problem List     Principal Problem:   Shock circulatory ([place]) Active Problems:   GI bleed   Hemorrhagic shock   Acute lower GI bleeding   Rectal/anal ulcer   Lactic acidosis   Bleeding gastrointestinal   Coagulopathy (HCC)   Acute blood loss anemia   CKD (chronic kidney disease), stage III   Acute on chronic systolic and diastolic heart failure, NYHA class 3 ([place])    Patient Profile:   Primary Cardiologist: Dr. Lovena Le  82 y.o male w/ PMH of ICM (s/p CABG in 2004, EF 30%), CHB (s/p MDT PPM), CKD III, S-CHF, HTN, HLD, PAF (on coumadin), GERD, DM2, and bladder CA admitted on 12/04/2015 for GI Bleed. Cards consulted on 12/08/2015 for acute on chronic combined CHF.  Subjective   Feeling better this AM. Denies any chest pain or palpitations. Remains concerned about his right arm swelling. Upper extremity duplex performed this AM.  Inpatient Medications    . allopurinol  300 mg Oral Daily  . aspirin  81 mg Oral Daily  . carvedilol  3.125 mg Oral BID WC  . cefTRIAXone (ROCEPHIN)  IV  1 g Intravenous Q24H  . digoxin  0.0625 mg Oral Daily  . docusate sodium  200 mg Oral BID  . feeding supplement (ENSURE ENLIVE)  237 mL Oral TID BM  . furosemide  40 mg Intravenous Daily  . heparin subcutaneous  5,000 Units Subcutaneous Q8H  . insulin aspart  0-5 Units Subcutaneous QHS  . insulin aspart  0-9 Units Subcutaneous TID WC  . polyethylene glycol  17 g Oral Daily  . pravastatin  40 mg Oral q1800  . vitamin B-12  1,000 mcg Oral Daily    Vital Signs    Filed Vitals:   12/09/15 1152 12/09/15 1638 12/09/15 2153 12/10/15 0458  BP: 111/50 108/49 103/54 116/66  Pulse: 90 89 92 92  Temp: 98.2 F (36.8 C) 98.3 F (36.8 C) 98.3 F (36.8 C) 98.4 F (36.9 C)  TempSrc: Oral Oral Oral Oral  Resp: 18 16 16 16   Height:      Weight:    112 lb 4.8 oz (50.939 kg)  SpO2: 100% 100% 100% 100%    Intake/Output Summary (Last 24 hours) at 12/10/15 0906 Last data filed at 12/10/15  0700  Gross per 24 hour  Intake    570 ml  Output   1177 ml  Net   -607 ml   Filed Weights   12/08/15 0450 12/09/15 0808 12/10/15 0458  Weight: 120 lb 3.2 oz (54.522 kg) 112 lb 12.8 oz (51.166 kg) 112 lb 4.8 oz (50.939 kg)    Physical Exam    General: Thin-appearing, elderly Caucasian male appearing in no acute distress. Head: Normocephalic, atraumatic.  Neck: Supple without bruits, JVD not elevated. Lungs:  Resp regular and unlabored, no wheezing or rales appreciated. Heart: RRR, S1, S2, no S3, S4, or murmur; no rub. Abdomen: Soft, non-tender, non-distended with normoactive bowel sounds. No hepatomegaly. No rebound/guarding. No obvious abdominal masses. Extremities: No clubbing, cyanosis, or lower extremity edema. Significant swelling along right upper extremity. Distal pedal pulses are 2+ bilaterally. Neuro: Alert and oriented X 3. Moves all extremities spontaneously. Psych: Normal affect.  Labs    CBC  Recent Labs  12/09/15 0453 12/10/15 0308  WBC 19.9* 20.2*  HGB 8.3* 8.6*  HCT 24.1* 25.7*  MCV 88.3 89.2  PLT 130* [id number]   Basic Metabolic Panel  Recent Labs  12/09/15 0453 12/10/15 0308  NA 128* 133*  K 5.0 4.7  CL 100* 101  CO2 18* 21*  GLUCOSE 153* 203*  BUN 60* 64*  CREATININE 1.74* 1.53*  CALCIUM 7.7* 7.8*   Hemoglobin A1C  Recent Labs  12/08/15 0917  HGBA1C 5.9*    Telemetry    V-paced, HR in 80's - 90's. No atopic events.  ECG    No new tracings.   Cardiac Studies and Radiology    Dg Chest Port 1 View: 12/08/2015  CLINICAL DATA:  Shortness of breath EXAM: PORTABLE CHEST 1 VIEW COMPARISON:  12/06/2015 FINDINGS: Chronic cardiomegaly. Biventricular pacer from the right with leads in stable position. Status post CABG. New hazy appearance at the right base with costophrenic sulcus blunting. No pneumothorax. Remote mid left clavicle fracture. IMPRESSION: New small right pleural effusion. Electronically Signed   By: Monte Fantasia M.D.   On:  12/08/2015 06:31   Echocardiogram: 12/07/2015 Study Conclusions - Left ventricle: The cavity size was normal. Wall thickness was  normal. Systolic function was moderately to severely reduced. The  estimated ejection fraction was in the range of 30% to 35%. There  is akinesis of the anterior, inferolateral, and apical  myocardium. The study is not technically sufficient to allow  evaluation of LV diastolic function. - Aortic valve: Valve mobility was restricted. There was mild to  moderate stenosis. There was trivial regurgitation. Valve area  (VTI): 1.48 cm^2. Valve area (Vmax): 1.41 cm^2. Valve area  (Vmean): 1.34 cm^2. - Mitral valve: There was mild regurgitation. - Left atrium: The atrium was mildly dilated. - Tricuspid valve: There was moderate regurgitation. - Pulmonary arteries: Systolic pressure was moderately to severely  increased. PA peak pressure: 64 mm Hg (S).  Impressions: - Akinesis of the inferolateral wall, anterior wall and apex;  overall moderate to severe reduction in LV function; calcified  aortic valve with mild to moderate AS by continuity equation;  mild MR; mild LAE; moderate TR; moderate to severe pulmonary  hypertension.  Assessment & Plan    1. Acute on chronic systolic and diastolic heart failure, NYHA Class 3  - Started on IV Lasix 40mg  BID.  Is overall net positive + 10.9L this admission. Diuresed -1.4L yesterday. - Follow I/O, BMET, daily weights. - Creatinine bumped to 1.74 on 12/09/2015, improved to 1.53 today. Will switch from IV to prior to admission PO Lasix dosing of 80mg  daily.  - continue BB. No ACE-I secondary to renal insufficiency. Restarting Spironolactone.  2.Acute Lower GI bleed - rectal bleed, s/p cautery - bowel regimen per GI. - GI has signed off, ok to add ASA 81 mg and restart Coumadin in several weeks.  3. Paroxysmal Atrial FIbrillation - none seen on last device interrogation - This patients CHA2DS2-VASc Score  and unadjusted Ischemic Stroke Rate (% per year) is equal to 9.7 % stroke rate/year from a score of 6 (CHF, HTN, DM, Vascular, Age (2)). Coumadin held in setting of GI bleed. - continue BB and Digoxin.  4. Complete Heart Block - s/p PPM in 03/2013  5. Right Upper Extremity Swelling - reports this has been getting worse for the past several days. - upper extremity venous doppler results pending. - per admitting team.   Erma Heritage 12/10/2015 9:06 AM   I have personally seen and examined this patient with Bernerd Pho, PA-C. I agree with the assessment and plan as outlined above. He feels better overall. Volume status is better. His right arm swelling is likely not related to volume overload. Venous  doppler Right UE pending this am. Would change Lasix to po today.   MCALHANY,CHRISTOPHER 12/10/2015 9:49 AM

## 2015-12-10 NOTE — Progress Notes (Signed)
Rehab admissions - I will not have an acute inpatient rehab bed open today.  I have informed the case manager.  Call me for questions.  [phone]:[id number]

## 2015-12-10 NOTE — [place] FL2 (Signed)
[place] MEDICAID FL2 LEVEL OF CARE SCREENING TOOL     IDENTIFICATION  Patient Name: [name] Birthdate: [birth year] Sex: male Admission Date (Current Location): 12/04/2015  [facility] [place] Number:  Herbalist and Address:  The . [facility], [place] [address], [place], [place] [address]      Provider Number: [id number]  Attending Physician Name and Address:  Mendel Corning, MD  Relative Name and Phone Number:       Current Level of Care: Hospital Recommended Level of Care: [place] Prior Approval Number:    Date Approved/Denied:   PASRR Number: [id number]  Discharge Plan: SNF    Current Diagnoses: Patient Active Problem List   Diagnosis Date Noted  . Acute on chronic systolic and diastolic heart failure, NYHA class 3 ([place]) 12/08/2015  . CKD (chronic kidney disease), stage III   . Coagulopathy ([place])   . Acute blood loss anemia   . Shock circulatory ([place]) 12/06/2015  . Lactic acidosis 12/06/2015  . Bleeding gastrointestinal   . GI bleed 12/05/2015  . Hemorrhagic shock 12/05/2015  . Acute lower GI bleeding   . Rectal/anal ulcer   . Dehydration 06/29/2014  . Acute on chronic renal failure ([place]) 06/29/2014  . Warfarin-induced coagulopathy ([place]) 06/28/2014  . Acute renal failure ([place]) 06/28/2014  . 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 Depression 09/18/2012  . Diminishing R waves-ICD lead 09/18/2012  . Biventricular cardiac pacemaker in situ 10/11/2011  . Atrial fibrillation ([place])   . Coronary artery disease   . Renal insufficiency   . Hypertension   . Hyperlipidemia   . Chronic systolic CHF (congestive heart failure) (H[facility])     Orientation RESPIRATION BLADDER Height & Weight     Self, Time, Situation, Place      Weight: 112 lb 4.8 oz (50.939 kg) (scale b) Height:  5\' 8"  (172.7 cm)  BEHAVIORAL SYMPTOMS/MOOD NEUROLOGICAL BOWEL NUTRITION STATUS   (None)  (None)      AMBULATORY STATUS COMMUNICATION OF  NEEDS Skin   Extensive Assist                           Personal Care Assistance Level of Assistance  Bathing, Feeding, Dressing Bathing Assistance: Limited assistance Feeding assistance: Independent Dressing Assistance: Limited assistance     Functional Limitations Info  Sight, Hearing, Speech Sight Info: Adequate   Speech Info: Adequate    SPECIAL CARE FACTORS FREQUENCY                       Contractures      Additional Factors Info                  Current Medications (12/10/2015):  This is the current hospital active medication list Current Facility-Administered Medications  Medication Dose Route Frequency Provider Last Rate Last Dose  . acetaminophen (TYLENOL) tablet 650 mg  650 mg Oral Q4H PRN Bethena Roys, MD   650 mg at 12/08/15 2128  . allopurinol (ZYLOPRIM) tablet 300 mg  300 mg Oral Daily Thurnell Lose, MD   300 mg at 12/10/15 1104  . aspirin chewable tablet 81 mg  81 mg Oral Daily Thurnell Lose, MD   81 mg at 12/10/15 1102  . carvedilol (COREG) tablet 3.125 mg  3.125 mg Oral BID WC Ripudeep K Rai, MD   3.125 mg at 12/10/15 0800  . cefTRIAXone (ROCEPHIN) 1 g  in dextrose 5 % 50 mL IVPB  1 g Intravenous Q24H Thurnell Lose, MD   1 g at 12/10/15 1104  . digoxin (LANOXIN) tablet 0.0625 mg  0.0625 mg Oral Daily Thurnell Lose, MD   0.0625 mg at 12/10/15 1103  . docusate sodium (COLACE) capsule 200 mg  200 mg Oral BID Thurnell Lose, MD   200 mg at 12/10/15 1104  . feeding supplement (ENSURE ENLIVE) (ENSURE ENLIVE) liquid 237 mL  237 mL Oral TID BM [place] Nickel, MD   237 mL at 12/10/15 1102  . furosemide (LASIX) tablet 80 mg  80 mg Oral Daily Erma Heritage, [place]   80 mg at 12/10/15 1104  . heparin injection 5,000 Units  5,000 Units Subcutaneous Q8H Thurnell Lose, MD   5,000 Units at 12/10/15 0518  . insulin aspart (novoLOG) injection 0-5 Units  0-5 Units Subcutaneous QHS Maryellen Pile, MD   3 Units at 12/07/15 2127  . insulin  aspart (novoLOG) injection 0-9 Units  0-9 Units Subcutaneous TID WC Maryellen Pile, MD   3 Units at 12/10/15 1222  . ipratropium (ATROVENT) 0.06 % nasal spray 1-2 spray  1-2 spray Each Nare TID PRN Ejiroghene Arlyce Dice, MD      . ondansetron (ZOFRAN) injection 4 mg  4 mg Intravenous Q6H PRN Tammy S Parrett, NP   4 mg at 12/05/15 1002  . polyethylene glycol (MIRALAX / GLYCOLAX) packet 17 g  17 g Oral Daily Vena Rua, PA-C   17 g at 12/10/15 1102  . pravastatin (PRAVACHOL) tablet 40 mg  40 mg Oral q1800 Thurnell Lose, MD   40 mg at 12/09/15 1801  . spironolactone (ALDACTONE) tablet 25 mg  25 mg Oral Daily Erma Heritage, [place]   25 mg at 12/10/15 1103  . vitamin B-12 (CYANOCOBALAMIN) tablet 1,000 mcg  1,000 mcg Oral Daily Thurnell Lose, MD   1,000 mcg at 12/10/15 1103  . zolpidem (AMBIEN) tablet 5 mg  5 mg Oral QHS PRN Etta Quill, DO   5 mg at 12/08/15 2124     Discharge Medications: Please see discharge summary for a list of discharge medications.  Relevant Imaging Results:  Relevant Lab Results:   Additional [place], LCSW

## 2015-12-10 NOTE — Clinical Social Work Placement (Signed)
   CLINICAL SOCIAL WORK PLACEMENT  NOTE  Date:  12/10/2015  Patient Details  Name: [name] MRN: [id number] Date of Birth: [birth year]  Clinical Social Work is seeking post-discharge placement for this patient at the [place] level of care (*CSW will initial, date and re-position this form in  chart as items are completed):  Yes   Patient/family provided with [facility] Work Department's list of facilities offering this level of care within the geographic area requested by the patient (or if unable, by the patient's family).  Yes   Patient/family informed of their freedom to choose among providers that offer the needed level of care, that participate in Medicare, Medicaid or managed care program needed by the patient, have an available bed and are willing to accept the patient.  Yes   Patient/family informed of [facility]'s ownership interest in [facility] and [facility], as well as of the fact that they are under no obligation to receive care at these facilities.  PASRR submitted to EDS on       PASRR number received on 12/10/15     Existing PASRR number confirmed on       FL2 transmitted to all facilities in geographic area requested by pt/family on       FL2 transmitted to all facilities within larger geographic area on       Patient informed that his/her managed care company has contracts with or will negotiate with certain facilities, including the following:            Patient/family informed of bed offers received.  Patient chooses bed at       Physician recommends and patient chooses bed at      Patient to be transferred to   on  .  Patient to be transferred to facility by       Patient family notified on   of transfer.  Name of family member notified:        PHYSICIAN       Additional Comment:    _______________________________________________ Candie Chroman, LCSW 12/10/2015, 1:26 PM

## 2015-12-10 NOTE — Progress Notes (Signed)
VASCULAR LAB PRELIMINARY  PRELIMINARY  PRELIMINARY  PRELIMINARY  Bilateral upper extremity venous duplex completed.    Preliminary report:  There is no DVT or SVT noted in the bilateral upper extremities.  There is a superficial thrombosis noted in the right basilic vein and in the left cephalic vein.  Significant interstitial fluid noted throughout the bilateral upper extremities.  Called report to [place], Therapist, sports.  Iman Orourke, RVT 12/10/2015, 9:12 AM

## 2015-12-10 NOTE — Progress Notes (Signed)
[facility]                                                                              Patient Demographics  [name], is a 82 y.o. male, DOB - 1924/10/17, [id number]  Admit date - 12/04/2015   Admitting Physician Raylene Miyamoto, MD  Outpatient Primary MD for the patient is Donnajean Lopes, MD  Outpatient specialists:   LOS - 5  days    Chief Complaint  Patient presents with  . GI Bleeding       Brief summary   Pleasant 82 year old male with history of atrial fibrillation on Coumadin, CAD status post CABG, CHF, type 2 diabetes mellitus, bladder cancer who was admitted initially for GI bleed, bright red blood per rectum causing anemia requiring transfusion and ICU admission. He was seen by GI. He was found to have a supratherapeutic INR requiring FFP and vitamin K, underwent flexible sigmoidoscopy showing a small rectal ulcer which was bleeding likely caused by constipation.  He also developed acute renal failure, massive edema in upper extremities and generalized weakness. Transferred to the floor on 4/25 from ICU.   Assessment & Plan     Acute lower GI bleed associated anemia with Circulatory shock in the setting of Coumadin and supratherapeutic INR - Shock has resolved -  INR was reversed, Coumadin was placed on hold, received 6 units of packed RBCs in the ICU.  - H&H now stable.  -GI was consulted, patient underwent tagged RBC scan which showed GI bleeding source from sigmoid colon.  - Flexible sigmoidoscopy showed rectal ulcer with bleed which was cauterized, ulcer was likely caused by anal fissure/ constipation. - Per GI hold Coumadin for 2 weeks, 81 mg of aspirin, monitor H&H, avoid constipation.    Leukocytosis due to Escherichia coli UTI. - Patient was placed on IV Rocephin, discontinued Foley. Will continue for total 7 days   Acute on chronic systolic and diastolic CHF, NYHA class 3/ Ischemic cardiomyopathy - Started on IV Lasix 40 mg  twice a day, still +10.3 L , Transitioned to oral Lasix from today -Cardiology following. - on low-dose Coreg, no ACE/ARB due to renal failure.   Chronic atrial fibrillation, complete heart block - [place] VASC 6, Coumadin currently held in the setting of GI bleed - Placed on low-dose aspirin as recommended by GI - Cardiology following. - Status post pacemaker in 2014  CAD. Status post CABG. Chest pain-free -  mild troponin bump likely demand ischemia from anemia - on low-dose aspirin, continue statin, low-dose beta blocker.    Dyslipidemia. Replaced on statin.  Essential hypertension. - ACE inhibitor on hold due to ARF, low-dose beta blocker and monitor.   Acute kidney injury -  likely due to shock from anemia, continue Lasix however dose decreased today due to bump in creatinine.     History of gout. On allopurinol.  Upper extremity edema - Doppler ultrasound showed no DVT or SVT in the bilateral upper extremities, superficial thrombosis in the right basilic vein and left cephalic vein. Elevate arm, warm compresses   Code Status:Full CODE STATUS   Family Communication: Discussed in detail with  the patient, all imaging results, lab results explained to the patient    Disposition Plan: Possible CIR/SNF , awaiting bed  Time Spent in minutes 25 minutes  Procedures  Tagged RBC scan   flexible sigmoidoscopy  Consults   Cardiology   GI Inpatient rehabilitation  DVT Prophylaxis SCD's  Medications  Scheduled Meds: . allopurinol  300 mg Oral Daily  . aspirin  81 mg Oral Daily  . carvedilol  3.125 mg Oral BID WC  . cefTRIAXone (ROCEPHIN)  IV  1 g Intravenous Q24H  . digoxin  0.0625 mg Oral Daily  . docusate sodium  200 mg Oral BID  . feeding supplement (ENSURE ENLIVE)  237 mL Oral TID BM  . furosemide  80 mg Oral Daily  . heparin subcutaneous  5,000 Units Subcutaneous Q8H  . insulin aspart  0-5 Units Subcutaneous QHS  . insulin aspart  0-9 Units Subcutaneous TID WC   . polyethylene glycol  17 g Oral Daily  . pravastatin  40 mg Oral q1800  . spironolactone  25 mg Oral Daily  . vitamin B-12  1,000 mcg Oral Daily   Continuous Infusions:  PRN Meds:.acetaminophen, ipratropium, ondansetron (ZOFRAN) IV, zolpidem   Antibiotics   Anti-infectives    Start     Dose/Rate Route Frequency Ordered Stop   12/08/15 1130  cefTRIAXone (ROCEPHIN) 1 g in dextrose 5 % 50 mL IVPB    Comments:  Give diphenhydramine 25mg  IV PRN allergic reaction to Ceftriaxone and Solumedrol 60mg  IV x1 prn if develops allergic reaction to Ceftriaxone and notify MD to Discontinue the ceftriaxone.   1 g 100 mL/hr over 30 Minutes Intravenous Every 24 hours 12/08/15 1002          Subjective:   [name] was seen and examined today.Tired but no other complaints.  Patient denies dizziness, chest pain, shortness of breath, abdominal pain, N/V/D/C, new weakness, numbess, tingling.   Objective:   Filed Vitals:   12/09/15 1638 12/09/15 2153 12/10/15 0458 12/10/15 1104  BP: 108/49 103/54 116/66 119/63  Pulse: 89 92 92 90  Temp: 98.3 F (36.8 C) 98.3 F (36.8 C) 98.4 F (36.9 C) 98.2 F (36.8 C)  TempSrc: Oral Oral Oral   Resp: 16 16 16 18   Height:      Weight:   50.939 kg (112 lb 4.8 oz)   SpO2: 100% 100% 100% 99%    Intake/Output Summary (Last 24 hours) at 12/10/15 1137 Last data filed at 12/10/15 0700  Gross per 24 hour  Intake    330 ml  Output    851 ml  Net   -521 ml     Wt Readings from Last 3 Encounters:  12/10/15 50.939 kg (112 lb 4.8 oz)  04/07/15 58.877 kg (129 lb 12.8 oz)  06/29/14 57.7 kg (127 lb 3.3 oz)     Exam  General: Alert and oriented x 3, NAD, frail-appearing  HEENT:    Neck: Supple, +JVD, no masses  CVS: S1 S2 Clear, RRR  Respiratory: Decreased breath sounds at the bases   Abdomen: Soft, nontender, nondistended, + bowel sounds  Ext: no cyanosis clubbing, + edema worse in the upper extremities, right >left  Neuro: no new deficits    Skin: No rashes  Psych: Normal affect and demeanor, alert and oriented x3    Data Reviewed:  I have personally reviewed following labs and imaging studies  Micro Results Recent Results (from the past 240 hour(s))  Urine culture     Status: Abnormal  Collection Time: 12/05/15 12:55 AM  Result Value Ref Range Status   Specimen Description URINE, CATHETERIZED  Final   Special Requests NONE  Final   Culture >=100,000 COLONIES/mL ESCHERICHIA COLI (A)  Final   Report Status 12/07/2015 FINAL  Final   Organism ID, Bacteria ESCHERICHIA COLI (A)  Final      Susceptibility   Escherichia coli - MIC*    AMPICILLIN >=32 RESISTANT Resistant     CEFAZOLIN <=4 SENSITIVE Sensitive     CEFTRIAXONE <=1 SENSITIVE Sensitive     CIPROFLOXACIN <=0.25 SENSITIVE Sensitive     GENTAMICIN <=1 SENSITIVE Sensitive     IMIPENEM <=0.25 SENSITIVE Sensitive     NITROFURANTOIN <=16 SENSITIVE Sensitive     TRIMETH/SULFA <=20 SENSITIVE Sensitive     AMPICILLIN/SULBACTAM >=32 RESISTANT Resistant     PIP/TAZO <=4 SENSITIVE Sensitive     * >=100,000 COLONIES/mL ESCHERICHIA COLI  MRSA PCR Screening     Status: None   Collection Time: 12/05/15  5:45 AM  Result Value Ref Range Status   MRSA by PCR NEGATIVE NEGATIVE Final    Comment:        The GeneXpert MRSA Assay (FDA approved for NASAL specimens only), is one component of a comprehensive MRSA colonization surveillance program. It is not intended to diagnose MRSA infection nor to guide or monitor treatment for MRSA infections.     Radiology Reports Ct Abdomen Pelvis Wo Contrast  11/27/2015  CLINICAL DATA:  Acute onset of urinary retention and constipation. Rectal burning. Initial encounter. EXAM: CT ABDOMEN AND PELVIS WITHOUT CONTRAST TECHNIQUE: Multidetector CT imaging of the abdomen and pelvis was performed following the standard protocol without IV contrast. COMPARISON:  CT of the abdomen and pelvis from 10/15/2015 FINDINGS: A trace right-sided  pleural effusion is noted, with mild right basilar atelectasis. Pacemaker leads are partially imaged. The liver and spleen are unremarkable in appearance. The gallbladder is within normal limits. The pancreas and adrenal glands are unremarkable. Scattered right renal cysts are seen, one of which demonstrates some peripheral calcification. Nonspecific perinephric stranding is noted bilaterally. There is no evidence of hydronephrosis. No renal or ureteral stones are identified. No free fluid is identified. The small bowel is unremarkable in appearance. The stomach is within normal limits. No acute vascular abnormalities are seen. Scattered calcification is seen along the abdominal aorta and its branches, particularly prominent along the common iliac arteries bilaterally. The appendix is normal in caliber, without evidence of appendicitis. Scattered diverticulosis is noted along the descending and sigmoid colon, without evidence of diverticulitis. The rectum is largely filled with stool, measuring 7.0 cm in transverse dimension. The bladder is mildly distended, with a Foley catheter in place. The prostate remains normal in size. No inguinal lymphadenopathy is seen. No acute osseous abnormalities are identified. Chronic bilateral pars defects are seen at L5. IMPRESSION: 1. Rectum largely filled with stool, measuring 7.0 cm in transverse dimension, raising concern for mild fecal impaction. Would correlate with the patient's symptoms. The remainder of the colon is largely filled with stool. 2. Scattered diverticulosis along the descending and sigmoid colon, without evidence of diverticulitis. 3. Scattered right renal cysts, one of which demonstrates some peripheral calcification. Nonspecific bilateral perinephric stranding is grossly stable. 4. Previously noted ascites has resolved. 5. Residual trace right pleural effusion, with mild right basilar atelectasis. 6. Scattered calcification along the abdominal aorta and its  branches, particularly prominent along the common iliac arteries bilaterally. 7. Chronic bilateral pars defects at L5, without evidence of anterolisthesis. Electronically  Signed   By: Garald Balding M.D.   On: 11/27/2015 21:56   Ct Head Wo Contrast  11/24/2015  CLINICAL DATA:  82 year old male who fell while walking to the male box. Struck his left head and face on concrete. Loss of consciousness. Headache. On blood thinners. Initial encounter. EXAM: CT HEAD WITHOUT CONTRAST CT MAXILLOFACIAL WITHOUT CONTRAST TECHNIQUE: Multidetector CT imaging of the head and maxillofacial structures were performed using the standard protocol without intravenous contrast. Multiplanar CT image reconstructions of the maxillofacial structures were also generated. COMPARISON:  None available. FINDINGS: CT HEAD FINDINGS Face findings are described below. No scalp hematoma identified. Calvarium intact. Cerebral volume loss. Patchy bilateral cerebral white matter hypodensity. Small chronic appearing lacunar infarcts in the cerebellum and right thalamus. Small area of cortical encephalomalacia in the right MCA territory of the superior right frontal gyrus series 2, image 25. No midline shift, mass effect, or evidence of intracranial mass lesion. No acute intracranial hemorrhage identified. No areas of acute cortically based infarction identified. CT MAXILLOFACIAL FINDINGS Negative visualized nonco Calcified atherosclerosis at the skull base. ntrast brain parenchyma aside from cerebral volume loss. Calcified atherosclerosis at the skull base. No scalp hematoma identified. Postoperative changes to both globes. No acute orbit soft tissue abnormality identified. There is a left face subcutaneous contusion or hematoma on series 4, image 41, at an just below the zygoma. There is mild abnormal retro maxillary soft tissue density (series 4, image 53). Other visualized noncontrast deep soft tissue spaces of the face remain normal. The mandible  remains intact. There is a mildly comminuted fracture through the posterior superior wall of the left maxillary sinus (series 5, image 57). Hyperdensity throughout the left maxillary sinus therefore may be hemorrhage. The other paranasal sinuses remain clear. There is a nondisplaced fracture of the left zygomatic arch (image 54). There is a mildly comminuted fracture of the lateral wall of the left orbit. The left orbital floor appears to remain intact. No nasal bone fracture identified. No contralateral right face fractures identified. Advanced degenerative changes in the visualized cervical spine with multilevel spondylolisthesis. IMPRESSION: 1. Mildly comminuted fractures of the left maxillary sinus posterior wall, left orbit lateral wall, left zygomatic arch. Overlying soft tissue hematoma. Hemorrhage in the left maxillary sinus. 2. No other acute facial fracture. 3. Chronic small and medium-sized vessel ischemia in the brain with no acute intracranial abnormality. Electronically Signed   By: Genevie Ann M.D.   On: 11/24/2015 14:52   Nm Gi Blood Loss  12/05/2015  CLINICAL DATA:  Bright red blood per rectum. Patient actively bleeding. Gastrointestinal bleeding K92.2 (ICD-10-CM) Gastrointestinal hemorrhage, unspecified gastritis, unspecified gastrointestinal hemorrhage type K92.2 (ICD-10-CM) EXAM: NUCLEAR MEDICINE GASTROINTESTINAL BLEEDING SCAN TECHNIQUE: Sequential abdominal images were obtained following intravenous administration of Tc-60m labeled red blood cells. RADIOPHARMACEUTICALS:  25.2 mCi Tc-85m in-vitro labeled red cells. COMPARISON:  CT, 11/27/2015 FINDINGS: There is a small elongated focus of uptake in the right central pelvis, adjacent to the right dome of the bladder and right common iliac artery, which becomes apparent approximately 20 minutes following the injection of radiotracer, fading later during the examination. Although somewhat equivocal, this suggests a gastro intestinal bleeding site from  the sigmoid colon. There is no other evidence of a GI bleeding source. No other abnormal radiotracer accumulation. IMPRESSION: 1. Small area of abnormal radiotracer accumulation in right central pelvis suggesting a GI bleeding source from the sigmoid colon. Electronically Signed   By: Lajean Manes M.D.   On: 12/05/2015 14:51  Dg Chest Port 1 View  12/08/2015  CLINICAL DATA:  Shortness of breath EXAM: PORTABLE CHEST 1 VIEW COMPARISON:  12/06/2015 FINDINGS: Chronic cardiomegaly. Biventricular pacer from the right with leads in stable position. Status post CABG. New hazy appearance at the right base with costophrenic sulcus blunting. No pneumothorax. Remote mid left clavicle fracture. IMPRESSION: New small right pleural effusion. Electronically Signed   By: Monte Fantasia M.D.   On: 12/08/2015 06:31   Dg Chest Port 1 View  12/06/2015  CLINICAL DATA:  CHF EXAM: PORTABLE CHEST 1 VIEW COMPARISON:  12/04/2015 chest radiograph. FINDINGS: Sternotomy wires appear aligned and intact. Stable configuration of 2 lead right subclavian pacemaker with lead tips overlying the coronary sinus and right ventricle. CABG clips overlie the mediastinum. Stable cardiomediastinal silhouette with normal heart size. No pneumothorax. No pleural effusion. Lungs appear clear, with no acute consolidative airspace disease and no pulmonary edema. IMPRESSION: No active disease. Electronically Signed   By: Ilona Sorrel M.D.   On: 12/06/2015 14:27   Dg Chest Port 1 View  12/04/2015  CLINICAL DATA:  Altered mental status.  GI bleed. EXAM: PORTABLE CHEST 1 VIEW COMPARISON:  11/27/2015 FINDINGS: Cardiac pacemaker. Postoperative changes in the mediastinum. Shallow inspiration. Heart size and pulmonary vascularity are normal. No focal airspace disease or consolidation in the lungs. No blunting of costophrenic angles. No pneumothorax. Old left rib fractures. Old left clavicular fracture. IMPRESSION: No active disease. Electronically Signed   By:  Lucienne Capers M.D.   On: 12/04/2015 23:57   Dg Abd Acute W/chest  11/27/2015  CLINICAL DATA:  Burning sensations in the rectal region and constipation with urine blockage. EXAM: DG ABDOMEN ACUTE W/ 1V CHEST COMPARISON:  Previous examinations. FINDINGS: The heart remains grossly normal in size. Stable post CABG changes and right subclavian pacemaker leads. Clear lungs with normal vascularity. Normal bowel gas pattern without free peritoneal air. Prominent stool throughout the colon. Mild scoliosis. Mild lumbar spine degenerative changes. Atheromatous arterial calcifications. IMPRESSION: No acute abnormality.  Prominent stool. Electronically Signed   By: Claudie Revering M.D.   On: 11/27/2015 18:22   Ct Maxillofacial Wo Cm  11/24/2015  CLINICAL DATA:  82 year old male who fell while walking to the male box. Struck his left head and face on concrete. Loss of consciousness. Headache. On blood thinners. Initial encounter. EXAM: CT HEAD WITHOUT CONTRAST CT MAXILLOFACIAL WITHOUT CONTRAST TECHNIQUE: Multidetector CT imaging of the head and maxillofacial structures were performed using the standard protocol without intravenous contrast. Multiplanar CT image reconstructions of the maxillofacial structures were also generated. COMPARISON:  None available. FINDINGS: CT HEAD FINDINGS Face findings are described below. No scalp hematoma identified. Calvarium intact. Cerebral volume loss. Patchy bilateral cerebral white matter hypodensity. Small chronic appearing lacunar infarcts in the cerebellum and right thalamus. Small area of cortical encephalomalacia in the right MCA territory of the superior right frontal gyrus series 2, image 25. No midline shift, mass effect, or evidence of intracranial mass lesion. No acute intracranial hemorrhage identified. No areas of acute cortically based infarction identified. CT MAXILLOFACIAL FINDINGS Negative visualized nonco Calcified atherosclerosis at the skull base. ntrast brain  parenchyma aside from cerebral volume loss. Calcified atherosclerosis at the skull base. No scalp hematoma identified. Postoperative changes to both globes. No acute orbit soft tissue abnormality identified. There is a left face subcutaneous contusion or hematoma on series 4, image 41, at an just below the zygoma. There is mild abnormal retro maxillary soft tissue density (series 4, image 53). Other visualized noncontrast  deep soft tissue spaces of the face remain normal. The mandible remains intact. There is a mildly comminuted fracture through the posterior superior wall of the left maxillary sinus (series 5, image 57). Hyperdensity throughout the left maxillary sinus therefore may be hemorrhage. The other paranasal sinuses remain clear. There is a nondisplaced fracture of the left zygomatic arch (image 54). There is a mildly comminuted fracture of the lateral wall of the left orbit. The left orbital floor appears to remain intact. No nasal bone fracture identified. No contralateral right face fractures identified. Advanced degenerative changes in the visualized cervical spine with multilevel spondylolisthesis. IMPRESSION: 1. Mildly comminuted fractures of the left maxillary sinus posterior wall, left orbit lateral wall, left zygomatic arch. Overlying soft tissue hematoma. Hemorrhage in the left maxillary sinus. 2. No other acute facial fracture. 3. Chronic small and medium-sized vessel ischemia in the brain with no acute intracranial abnormality. Electronically Signed   By: Genevie Ann M.D.   On: 11/24/2015 14:52    CBC  Recent Labs Lab 12/04/15 2350  12/05/15 0055  12/05/15 1002  12/07/15 0529 12/07/15 1812 12/08/15 0425 12/09/15 0453 12/10/15 0308  WBC 14.3*  --  15.8*  < > 13.6*  < > 19.5* 18.9* 21.8* 19.9* 20.2*  HGB 13.1  < > 11.2*  < > 6.9*  < > 8.2* 7.8* 8.0* 8.3* 8.6*  HCT 38.0*  < > 31.3*  < > 20.7*  < > 23.6* 22.8* 23.3* 24.1* 25.7*  PLT 221  --  198  < > 81*  < > 78* 89* 106* 130* 192   MCV 95.0  --  96.3  < > 93.2  < > 86.1 85.7 86.0 88.3 89.2  MCH 32.8  --  34.5*  < > 31.1  < > 29.9 29.3 29.5 30.4 29.9  MCHC 34.5  --  35.8  < > 33.3  < > 34.7 34.2 34.3 34.4 33.5  RDW 14.2  --  14.5  < > 14.5  < > 14.9 14.8 15.2 15.1 15.3  LYMPHSABS 2.2  --  1.4  --  0.5*  --   --   --   --   --   --   MONOABS 1.2*  --  1.1*  --  1.2*  --   --   --   --   --   --   EOSABS 0.2  --  0.2  --  0.0  --   --   --   --   --   --   BASOSABS 0.1  --  0.0  --  0.0  --   --   --   --   --   --   < > = values in this interval not displayed.  Chemistries   Recent Labs Lab 12/04/15 2350  12/05/15 1002  12/06/15 0518 12/07/15 0529 12/08/15 0913 12/09/15 0453 12/10/15 0308  NA 124*  < > 136  < > 130* 132* 129* 128* 133*  K 5.2*  < > 4.7  < > 5.3* 5.1 5.0 5.0 4.7  CL 89*  < > 107  < > 105 104 104 100* 101  CO2 20*  < > 15*  < > 17* 19* 19* 18* 21*  GLUCOSE 288*  < > 203*  < > 94 105* 98 153* 203*  BUN 97*  < > 83*  < > 74* 61* 54* 60* 64*  CREATININE 2.43*  < > 2.11*  < > 1.79* 1.46*  1.36* 1.74* 1.53*  CALCIUM 8.6*  < > 7.1*  < > 6.5* 7.4* 7.7* 7.7* 7.8*  MG 1.8  --  1.7  --  1.4*  --   --   --   --   AST 44*  --   --   --   --   --   --   --   --   ALT 45  --   --   --   --   --   --   --   --   ALKPHOS 134*  --   --   --   --   --   --   --   --   BILITOT 1.1  --   --   --   --   --   --   --   --   < > = values in this interval not displayed. ------------------------------------------------------------------------------------------------------------------ estimated creatinine clearance is 24 mL/min (by C-G formula based on Cr of 1.53). ------------------------------------------------------------------------------------------------------------------  Recent Labs  12/08/15 0917  HGBA1C 5.9*   ------------------------------------------------------------------------------------------------------------------ No results for input(s): CHOL, HDL, LDLCALC, TRIG, CHOLHDL, LDLDIRECT in the  last 72 hours. ------------------------------------------------------------------------------------------------------------------ No results for input(s): TSH, T4TOTAL, T3FREE, THYROIDAB in the last 72 hours.  Invalid input(s): FREET3 ------------------------------------------------------------------------------------------------------------------ No results for input(s): VITAMINB12, FOLATE, FERRITIN, TIBC, IRON, RETICCTPCT in the last 72 hours.  Coagulation profile  Recent Labs Lab 12/06/15 1147 12/07/15 0529 12/08/15 0425 12/09/15 0453 12/10/15 0308  INR 2.05* 1.79* 1.86* 1.65* 1.66*    No results for input(s): DDIMER in the last 72 hours.  Cardiac Enzymes  Recent Labs Lab 12/05/15 1002 12/05/15 1520 12/05/15 1843  TROPONINI 0.10* 0.13* 0.20*   ------------------------------------------------------------------------------------------------------------------ Invalid input(s): POCBNP   Recent Labs  12/09/15 0529 12/09/15 0906 12/09/15 1151 12/09/15 1635 12/09/15 2110 12/10/15 0605  GLUCAP 156* 123* 131* 161* 138* 162*     Marzelle Rutten M.D. [facility] 12/10/2015, 11:37 AM  Pager: [phone] Between 7am to 7pm - call Pager - [phone]  After 7pm go to [email or web address] - password [email or web address]  Call night coverage person covering after 7pm

## 2015-12-11 ENCOUNTER — Inpatient Hospital Stay (HOSPITAL_COMMUNITY): Payer: Medicare Other

## 2015-12-11 ENCOUNTER — Inpatient Hospital Stay (HOSPITAL_COMMUNITY)
Admission: RE | Admit: 2015-12-11 | Discharge: 2015-12-12 | DRG: 947 | Disposition: A | Discharge status: Other Acute Inpatient Hospital | Payer: Medicare Other | Source: Intra-hospital | Attending: Family Medicine | Admitting: Family Medicine

## 2015-12-11 DIAGNOSIS — I4891 Unspecified atrial fibrillation: Secondary | ICD-10-CM | POA: Diagnosis present

## 2015-12-11 DIAGNOSIS — R404 Transient alteration of awareness: Secondary | ICD-10-CM

## 2015-12-11 DIAGNOSIS — R4189 Other symptoms and signs involving cognitive functions and awareness: Secondary | ICD-10-CM

## 2015-12-11 DIAGNOSIS — Z79899 Other long term (current) drug therapy: Secondary | ICD-10-CM

## 2015-12-11 DIAGNOSIS — Z950 Presence of cardiac pacemaker: Secondary | ICD-10-CM | POA: Diagnosis not present

## 2015-12-11 DIAGNOSIS — Z7901 Long term (current) use of anticoagulants: Secondary | ICD-10-CM | POA: Diagnosis not present

## 2015-12-11 DIAGNOSIS — N189 Chronic kidney disease, unspecified: Secondary | ICD-10-CM

## 2015-12-11 DIAGNOSIS — K922 Gastrointestinal hemorrhage, unspecified: Secondary | ICD-10-CM | POA: Diagnosis present

## 2015-12-11 DIAGNOSIS — R5381 Other malaise: Secondary | ICD-10-CM | POA: Diagnosis present

## 2015-12-11 DIAGNOSIS — F419 Anxiety disorder, unspecified: Secondary | ICD-10-CM | POA: Diagnosis present

## 2015-12-11 DIAGNOSIS — K5900 Constipation, unspecified: Secondary | ICD-10-CM | POA: Diagnosis present

## 2015-12-11 DIAGNOSIS — K219 Gastro-esophageal reflux disease without esophagitis: Secondary | ICD-10-CM | POA: Diagnosis present

## 2015-12-11 DIAGNOSIS — D649 Anemia, unspecified: Secondary | ICD-10-CM | POA: Diagnosis present

## 2015-12-11 DIAGNOSIS — Z8249 Family history of ischemic heart disease and other diseases of the circulatory system: Secondary | ICD-10-CM | POA: Diagnosis not present

## 2015-12-11 DIAGNOSIS — I5043 Acute on chronic combined systolic (congestive) and diastolic (congestive) heart failure: Secondary | ICD-10-CM | POA: Diagnosis present

## 2015-12-11 DIAGNOSIS — I2510 Atherosclerotic heart disease of native coronary artery without angina pectoris: Secondary | ICD-10-CM | POA: Diagnosis present

## 2015-12-11 DIAGNOSIS — Z951 Presence of aortocoronary bypass graft: Secondary | ICD-10-CM

## 2015-12-11 DIAGNOSIS — I130 Hypertensive heart and chronic kidney disease with heart failure and stage 1 through stage 4 chronic kidney disease, or unspecified chronic kidney disease: Secondary | ICD-10-CM | POA: Diagnosis present

## 2015-12-11 DIAGNOSIS — F329 Major depressive disorder, single episode, unspecified: Secondary | ICD-10-CM | POA: Diagnosis present

## 2015-12-11 DIAGNOSIS — B9620 Unspecified Escherichia coli [E. coli] as the cause of diseases classified elsewhere: Secondary | ICD-10-CM | POA: Diagnosis present

## 2015-12-11 DIAGNOSIS — M109 Gout, unspecified: Secondary | ICD-10-CM | POA: Diagnosis present

## 2015-12-11 DIAGNOSIS — Z87891 Personal history of nicotine dependence: Secondary | ICD-10-CM | POA: Diagnosis not present

## 2015-12-11 DIAGNOSIS — E785 Hyperlipidemia, unspecified: Secondary | ICD-10-CM | POA: Diagnosis present

## 2015-12-11 DIAGNOSIS — Z880 Allergy status to penicillin: Secondary | ICD-10-CM

## 2015-12-11 DIAGNOSIS — E1122 Type 2 diabetes mellitus with diabetic chronic kidney disease: Secondary | ICD-10-CM | POA: Diagnosis present

## 2015-12-11 DIAGNOSIS — N183 Chronic kidney disease, stage 3 unspecified: Secondary | ICD-10-CM | POA: Diagnosis present

## 2015-12-11 DIAGNOSIS — N179 Acute kidney failure, unspecified: Secondary | ICD-10-CM

## 2015-12-11 DIAGNOSIS — Z7951 Long term (current) use of inhaled steroids: Secondary | ICD-10-CM | POA: Diagnosis not present

## 2015-12-11 DIAGNOSIS — N390 Urinary tract infection, site not specified: Secondary | ICD-10-CM | POA: Diagnosis present

## 2015-12-11 DIAGNOSIS — Z8551 Personal history of malignant neoplasm of bladder: Secondary | ICD-10-CM

## 2015-12-11 LAB — BASIC METABOLIC PANEL
ANION GAP: 12 (ref 5–15)
Anion gap: 10 (ref 5–15)
BUN: 57 mg/dL — ABNORMAL HIGH (ref 6–20)
BUN: 61 mg/dL — ABNORMAL HIGH (ref 6–20)
CALCIUM: 8 mg/dL — AB (ref 8.9–10.3)
CALCIUM: 7.8 mg/dL — AB (ref 8.9–10.3)
CO2: 23 mmol/L (ref 22–32)
CO2: 24 mmol/L (ref 22–32)
CREATININE: 1.5 mg/dL — AB (ref 0.61–1.24)
Chloride: 100 mmol/L — ABNORMAL LOW (ref 101–111)
Chloride: 99 mmol/L — ABNORMAL LOW (ref 101–111)
Creatinine, Ser: 1.46 mg/dL — ABNORMAL HIGH (ref 0.61–1.24)
GFR, EST AFRICAN AMERICAN: 48 mL/min — AB (ref >60)
GFR, EST AFRICAN AMERICAN: 46 mL/min — AB (ref >60)
GFR, EST NON AFRICAN AMERICAN: 41 mL/min — AB (ref >60)
GFR, EST NON AFRICAN AMERICAN: 40 mL/min — AB (ref >60)
Glucose, Bld: 160 mg/dL — ABNORMAL HIGH (ref 65–99)
Glucose, Bld: 115 mg/dL — ABNORMAL HIGH (ref 65–99)
POTASSIUM: 4.3 mmol/L (ref 3.5–5.1)
Potassium: 4.5 mmol/L (ref 3.5–5.1)
SODIUM: 135 mmol/L (ref 135–145)
SODIUM: 133 mmol/L — AB (ref 135–145)

## 2015-12-11 LAB — CBC
HCT: 25.7 % — ABNORMAL LOW (ref 39.0–52.0)
Hemoglobin: 8.5 g/dL — ABNORMAL LOW (ref 13.0–17.0)
MCH: 29.9 pg (ref 26.0–34.0)
MCHC: 33.1 g/dL (ref 30.0–36.0)
MCV: 90.5 fL (ref 78.0–100.0)
PLATELETS: 199 10*3/uL (ref 150–400)
RBC: 2.84 MIL/uL — AB (ref 4.22–5.81)
RDW: 15.7 % — AB (ref 11.5–15.5)
WBC: 16.4 10*3/uL — AB (ref 4.0–10.5)

## 2015-12-11 LAB — PROTIME-INR
INR: 1.47 (ref 0.00–1.49)
Prothrombin Time: 17.9 seconds — ABNORMAL HIGH (ref 11.6–15.2)

## 2015-12-11 LAB — CBC WITH DIFFERENTIAL/PLATELET
BASOS PCT: 0 %
Basophils Absolute: 0 10*3/uL (ref 0.0–0.1)
EOS ABS: 0.2 10*3/uL (ref 0.0–0.7)
Eosinophils Relative: 1 %
HEMATOCRIT: 26.5 % — AB (ref 39.0–52.0)
HEMOGLOBIN: 8.8 g/dL — AB (ref 13.0–17.0)
Lymphocytes Relative: 10 %
Lymphs Abs: 1.8 10*3/uL (ref 0.7–4.0)
MCH: 30.8 pg (ref 26.0–34.0)
MCHC: 33.2 g/dL (ref 30.0–36.0)
MCV: 92.7 fL (ref 78.0–100.0)
MONOS PCT: 6 %
Monocytes Absolute: 1 10*3/uL (ref 0.1–1.0)
NEUTROS ABS: 14.4 10*3/uL — AB (ref 1.7–7.7)
NEUTROS PCT: 83 %
PLATELETS: 214 10*3/uL (ref 150–400)
RBC: 2.86 MIL/uL — AB (ref 4.22–5.81)
RDW: 17.2 % — AB (ref 11.5–15.5)
WBC: 17.4 10*3/uL — AB (ref 4.0–10.5)

## 2015-12-11 LAB — GLUCOSE, CAPILLARY
GLUCOSE-CAPILLARY: 165 mg/dL — AB (ref 65–99)
GLUCOSE-CAPILLARY: 254 mg/dL — AB (ref 65–99)
GLUCOSE-CAPILLARY: 108 mg/dL — AB (ref 65–99)
Glucose-Capillary: 170 mg/dL — ABNORMAL HIGH (ref 65–99)
Glucose-Capillary: 107 mg/dL — ABNORMAL HIGH (ref 65–99)

## 2015-12-11 LAB — TROPONIN I: TROPONIN I: 0.19 ng/mL — AB (ref <0.031)

## 2015-12-11 MED ORDER — SORBITOL 70 % SOLN: 30.0000 mL | Freq: Every day | Status: DC | PRN

## 2015-12-11 MED ORDER — IPRATROPIUM BROMIDE 0.06 % NA SOLN: 1.0000 | Freq: Three times a day (TID) | NASAL | Status: DC | PRN

## 2015-12-11 MED ORDER — POLYETHYLENE GLYCOL 3350 17 G PO PACK: 17.0000 g | PACK | Freq: Every day | ORAL | Status: DC

## 2015-12-11 MED ORDER — INSULIN ASPART 100 UNIT/ML ~~LOC~~ SOLN: 0.0000 [IU] | Freq: Every day | SUBCUTANEOUS | Status: DC

## 2015-12-11 MED ORDER — HEPARIN SODIUM (PORCINE) 5000 UNIT/ML IJ SOLN
5000.0000 [IU] | Freq: Three times a day (TID) | INTRAMUSCULAR | Status: DC
  Filled 2015-12-11: qty 1

## 2015-12-11 MED ORDER — DIGOXIN 125 MCG PO TABS: 0.0625 mg | ORAL_TABLET | Freq: Every day | ORAL | Status: DC

## 2015-12-11 MED ORDER — DOCUSATE SODIUM 100 MG PO CAPS
200.0000 mg | ORAL_CAPSULE | Freq: Two times a day (BID) | ORAL | Status: DC
  Filled 2015-12-11: qty 2

## 2015-12-11 MED ORDER — SODIUM CHLORIDE 0.9 % IV SOLN: INTRAVENOUS | Status: DC

## 2015-12-11 MED ORDER — PRAVASTATIN SODIUM 40 MG PO TABS: 40.0000 mg | ORAL_TABLET | Freq: Every day | ORAL | Status: DC

## 2015-12-11 MED ORDER — FUROSEMIDE 80 MG PO TABS: 80.0000 mg | ORAL_TABLET | Freq: Every day | ORAL | Status: DC

## 2015-12-11 MED ORDER — DEXTROSE 5 % IV SOLN: 1.0000 g | INTRAVENOUS | Status: DC

## 2015-12-11 MED ORDER — ACETAMINOPHEN 325 MG PO TABS: 650.0000 mg | ORAL_TABLET | ORAL | Status: DC | PRN

## 2015-12-11 MED ORDER — SPIRONOLACTONE 25 MG PO TABS: 25.0000 mg | ORAL_TABLET | Freq: Every day | ORAL | Status: DC

## 2015-12-11 MED ORDER — HEPARIN SODIUM (PORCINE) 5000 UNIT/ML IJ SOLN: 5000.0000 [IU] | Freq: Three times a day (TID) | INTRAMUSCULAR | Status: DC

## 2015-12-11 MED ORDER — ENSURE ENLIVE PO LIQD
237.0000 mL | Freq: Three times a day (TID) | ORAL | Status: DC
Start: 2015-12-11 — End: 2015-12-12

## 2015-12-11 MED ORDER — ALLOPURINOL 100 MG PO TABS: 300.0000 mg | ORAL_TABLET | Freq: Every day | ORAL | Status: DC

## 2015-12-11 MED ORDER — CARVEDILOL 3.125 MG PO TABS: 3.1250 mg | ORAL_TABLET | Freq: Two times a day (BID) | ORAL | Status: DC

## 2015-12-11 MED ORDER — ONDANSETRON HCL 4 MG/2ML IJ SOLN: 4.0000 mg | Freq: Four times a day (QID) | INTRAMUSCULAR | Status: DC | PRN

## 2015-12-11 MED ORDER — VITAMIN B-12 1000 MCG PO TABS: 1000.0000 ug | ORAL_TABLET | Freq: Every day | ORAL | Status: DC

## 2015-12-11 MED ORDER — ASPIRIN 81 MG PO CHEW: 81.0000 mg | CHEWABLE_TABLET | Freq: Every day | ORAL | Status: DC

## 2015-12-11 MED ORDER — ONDANSETRON HCL 4 MG PO TABS: 4.0000 mg | ORAL_TABLET | Freq: Four times a day (QID) | ORAL | Status: DC | PRN

## 2015-12-11 NOTE — Clinical Social Work Note (Signed)
Patient will be discharging to CIR today.  CSW signing off. Consult if any other social work needs arise.  Dayton Scrape, [place]

## 2015-12-11 NOTE — Interval H&P Note (Signed)
LAD PLANE was admitted today to Inpatient Rehabilitation with the diagnosis of debilitation after fall/multiple medical.  The patient's history has been reviewed, patient examined, and there is no change in status.  Patient continues to be appropriate for intensive inpatient rehabilitation.  I have reviewed the patient's chart and labs.  Questions were answered to the patient's satisfaction. The PAPE has been reviewed and assessment remains appropriate.  SWARTZ,ZACHARY T 12/11/2015, 8:02 PM

## 2015-12-11 NOTE — Progress Notes (Signed)
Retta Diones, RN Rehab Admission Coordinator Signed Physical Medicine and Rehabilitation PMR Pre-admission 12/11/2015 11:57 AM  Related encounter: ED to Hosp-Admission (Current) from 12/04/2015 in [facility]    Expand All Collapse All   PMR Admission Coordinator Pre-Admission Assessment  Patient: [name] is an 82 y.o., male MRN: [id number] DOB: 02-27-1925 Height: 5\' 8"  (172.7 cm) Weight: 48.988 kg (108 lb) (scale B)  Insurance Information HMO: No PPO: PCP: IPA: 80/20: OTHER:  PRIMARY: Medicare A/B Policy#: [id number] Subscriber: Barbaraann Rondo CM Name: Phone#: Fax#:  Pre-Cert#: Employer: Retired Benefits: Phone #: Name: Checked in [place]. Date: 01/14/92 Deduct: $1316 Out of Pocket Max: None Life Max: unlimited CIR: 100% SNF: 100 days Outpatient: 80% Co-Pay: 20% Home Health: 100% Co-Pay: none DME: 80% Co-Pay: 20% Providers: patient's choice  SECONDARY: [employer] indemnity Policy#: [id number] Subscriber: Barbaraann Rondo CM Name: Phone#: Fax#:  Pre-Cert#: Employer: Retired Benefits: Phone #: [phone] Name:  Eff. Date: Deduct: Out of Pocket Max: Life Max:  CIR: SNF:  Outpatient: Co-Pay:  Home Health: Co-Pay:  DME: Co-Pay:   Emergency Contact Information Contact Information    Name Relation Home Work Mobile   [place] Spouse [phone]  [phone]     Current Medical History  Patient Admitting Diagnosis: Debility/GI bleed/Fall  History of Present Illness: An 82 y.o. right handed male with history of atrial fibrillation maintained on chronic Coumadin, coronary artery disease/Pacemaker status post  CABG, systolic and diastolic congestive heart failure, type 2 diabetes mellitus, bladder cancer. Lives with wife in [place]. Reports to be independent prior to admission with a general decline over the past few months.Recent fall and hit the corner of his eye on the sink. Presented 12/05/2015 with bright red blood from the rectum as well as a recent fall from the toilet approximately 1 week ago. Patient reports some recent constipation 2 weeks ago but denied any nausea vomiting or abdominal pain. Patient with ongoing active bleeding in the ED. Hemoglobin 6.5. INR 4.86. EKG showed new ST changes and T-wave inversions in the anterior leads. Troponin 0.20. Patient did receive vitamin K fresh frozen plasma 2 units and 1 unit of packed red blood cells. Gastroenterology services consulted Dr. Carlean Purl with findings of a single solitary ulcer in the rectum and in the distal rectum. Underwent epi injection and then cauterization. Close monitoring of hemoglobin status post PRBCs 8 with latest hemoglobin 8.3. Patient's chronic Coumadin currently remains on hold and presently on aspirin.Vascular study bilateral upper extremities 12/10/2015 showed superficial thrombus noted in the right basilic vein and in the left cephalic vein. He has been placed on subcutaneous heparin for DVT prophylaxis and monitored. Current plan is to remain off Coumadin for 2 weeks per gastroenterology services. Follow-up cardiology services with echocardiogram showing ejection fraction of 35%. Akinesis of the anterior inferior lateral and apical myocardium. Placed on intravenous Lasix for acute on chronic systolic and diastolic heart failure and transition to by mouth. Close monitoring of creatinine 1.89-2.43. Tolerating a regular diet. Urine culture Escherichia coli presently maintained on Rocephin. Physical therapy evaluation completed 12/08/2015 for debilitation with recommendations of physical medicine rehabilitation consult.  Patient to be admitted for a comprehensive inpatient rehabilitation program.  Past Medical History  Past Medical History  Diagnosis Date  . Atrial fibrillation (HCC)     Chronic  . Coronary artery disease 2004    Status post CABG with LIMA-LAD, SVG-CFX. Hx of anterior apical infarct.  . Renal insufficiency     Chronic  .  Hypertension   . Hyperlipidemia   . Gout   . CHF (congestive heart failure) (HCC)     with chronic ischemic cardiomypathy. EF of 10-15%. CHF due to systolic dysfunction. Clinically doing well.  [name] GERD (gastroesophageal reflux disease)   . Pacemaker     Medtronic biventricular pacemaker, serial number [id number]  . Type II diabetes mellitus ([place])     "been off my RX for ~ 2 yr" (03/20/2013)  . H/O hiatal hernia   . Arthritis     "right thumb" (03/20/2013)  . Depression   . Anxiety   . Bladder cancer ([place])     "low grade; grade 1; non-invasive" (03/20/2013)  . CKD (chronic kidney disease), stage III     Family History  family history includes Heart attack in his mother.  Prior Rehab/Hospitalizations: No previous rehab  Has the patient had major surgery during 100 days prior to admission? No  Current Medications   Current facility-administered medications:  . acetaminophen (TYLENOL) tablet 650 mg, 650 mg, Oral, Q4H PRN, Bethena Roys, MD, 650 mg at 12/08/15 2128 . allopurinol (ZYLOPRIM) tablet 300 mg, 300 mg, Oral, Daily, Thurnell Lose, MD, 300 mg at 12/11/15 0930 . aspirin chewable tablet 81 mg, 81 mg, Oral, Daily, Thurnell Lose, MD, 81 mg at 12/11/15 0931 . carvedilol (COREG) tablet 3.125 mg, 3.125 mg, Oral, BID WC, Ripudeep K Rai, MD, 3.125 mg at 12/11/15 0803 . cefTRIAXone (ROCEPHIN) 1 g in dextrose 5 % 50 mL IVPB, 1 g, Intravenous, Q24H, Thurnell Lose, MD, 1 g at 12/11/15 1156 . digoxin (LANOXIN) tablet 0.0625 mg, 0.0625 mg, Oral, Daily, Thurnell Lose, MD,  0.0625 mg at 12/11/15 0931 . docusate sodium (COLACE) capsule 200 mg, 200 mg, Oral, BID, Thurnell Lose, MD, 200 mg at 12/11/15 0931 . feeding supplement (ENSURE ENLIVE) (ENSURE ENLIVE) liquid 237 mL, 237 mL, Oral, TID BM, [place] Nickel, MD, 237 mL at 12/10/15 2000 . furosemide (LASIX) tablet 80 mg, 80 mg, Oral, Daily, Brittany M Strader, [place], 80 mg at 12/11/15 0930 . heparin injection 5,000 Units, 5,000 Units, Subcutaneous, Q8H, Thurnell Lose, MD, 5,000 Units at 12/11/15 [phone] . insulin aspart (novoLOG) injection 0-5 Units, 0-5 Units, Subcutaneous, QHS, Maryellen Pile, MD, 3 Units at 12/07/15 2127 . insulin aspart (novoLOG) injection 0-9 Units, 0-9 Units, Subcutaneous, TID WC, Maryellen Pile, MD, 2 Units at 12/11/15 0700 . ipratropium (ATROVENT) 0.06 % nasal spray 1-2 spray, 1-2 spray, Each Nare, TID PRN, Ejiroghene E Emokpae, MD . ondansetron (ZOFRAN) injection 4 mg, 4 mg, Intravenous, Q6H PRN, Tammy S Parrett, NP, 4 mg at 12/05/15 1002 . [COMPLETED] polyethylene glycol (MIRALAX / GLYCOLAX) packet 32 g, 32 g, Oral, BID, 32 g at 12/07/15 2127 **FOLLOWED BY** polyethylene glycol (MIRALAX / GLYCOLAX) packet 17 g, 17 g, Oral, Daily, Charlynne Cousins Gribbin, PA-C, 17 g at 12/11/15 0930 . pravastatin (PRAVACHOL) tablet 40 mg, 40 mg, Oral, q1800, Thurnell Lose, MD, 40 mg at 12/10/15 1711 . spironolactone (ALDACTONE) tablet 25 mg, 25 mg, Oral, Daily, Fransisco Hertz Brent, [place], 25 mg at 12/11/15 0931 . vitamin B-12 (CYANOCOBALAMIN) tablet 1,000 mcg, 1,000 mcg, Oral, Daily, Thurnell Lose, MD, 1,000 mcg at 12/11/15 0930 . zolpidem (AMBIEN) tablet 5 mg, 5 mg, Oral, QHS PRN, Etta Quill, DO, 5 mg at 12/10/15 2128  Patients Current Diet: Diet Heart Room service appropriate?: Yes; Fluid consistency:: Thin  Precautions / Restrictions Precautions Precautions: Fall Precaution Comments: fell gettting off toilet just prior to admission  Restrictions Weight Bearing Restrictions: No   Has the  patient had 2 or more falls or a fall with injury in the past year?Yes. Has suffered 2 falls with injury.  Prior Activity Level Limited Community (1-2x/wk): Went out about 2-3 X a week to the doctor or to the grocery store.  Home Assistive Devices / Equipment Home Assistive Devices/Equipment: None Home Equipment: None  Prior Device Use: Indicate devices/aids used by the patient prior to current illness, exacerbation or injury? None  Prior Functional Level Prior Function Level of Independence: Independent Comments: per pt  Self Care: Did the patient need help bathing, dressing, using the toilet or eating? Independent  Indoor Mobility: Did the patient need assistance with walking from room to room (with or without device)? Independent  Stairs: Did the patient need assistance with internal or external stairs (with or without device)? Independent  Functional Cognition: Did the patient need help planning regular tasks such as shopping or remembering to take medications? Independent  Current Functional Level Cognition  Overall Cognitive Status: Impaired/Different from baseline Orientation Level: Oriented X4 Following Commands: Follows one step commands with increased time Safety/Judgement: Decreased awareness of safety, Decreased awareness of deficits   Extremity Assessment (includes Sensation/Coordination)  Upper Extremity Assessment: Generalized weakness  Lower Extremity Assessment: Generalized weakness    ADLs  Anticipate ADL needs    Mobility  Overal bed mobility: Needs Assistance Bed Mobility: Sidelying to Sit, Rolling Rolling: Min assist Sidelying to sit: Min assist Sit to supine: Supervision General bed mobility comments: heavy use of rail    Transfers  Overall transfer level: Needs assistance Equipment used: Rolling walker (2 wheeled) Transfers: Sit to/from Stand Sit to Stand: Min assist Stand pivot transfers: Min assist General transfer  comment: x 3 (various surfaces) reports some dizziness that resolved after 15 seconds    Ambulation / Gait / Stairs / Wheelchair Mobility  Ambulation/Gait Ambulation/Gait assistance: Museum/gallery curator (Feet): 10 Feet Assistive device: Rolling walker (2 wheeled) (holding IV pole) Gait Pattern/deviations: Step-through pattern, Decreased stance time - left, Shuffle General Gait Details: required assist to guide/steer RW around end of bed Gait velocity interpretation: Below normal speed for age/gender    Posture / Balance Balance Overall balance assessment: Needs assistance, History of Falls Sitting-balance support: No upper extremity supported, Feet unsupported Sitting balance-Leahy Scale: Good Standing balance support: No upper extremity supported Standing balance-Leahy Scale: Poor    Special needs/care consideration BiPAP/CPAP No CPM No Continuous Drip IV No  Dialysis No  Life Vest No Oxygen No Special Bed No Trach Size No Wound Vac (area) No  Skin Has bruising on left face, bruising on both lower extremities  Bowel mgmt: Last BM 12/09/15 Bladder mgmt: Voiding in urinal Diabetic mgmt Yes, but has been off medications for the past 3 years    Previous Home Environment Living Arrangements: Spouse/significant other Available Help at Discharge: Family, Available 24 hours/day ("my wife is 20 yrs younger than me") Type of Home: House Home Care Services: No Additional Comments: Pt provides different answers to same question so home setup unknown  Discharge Living Setting Plans for Discharge Living Setting: Patient's home, House, Lives with (comment) (Lives with wife and their dog, Dimple Nanas, who is a Art therapist) Type of Home at Discharge: House Discharge Home Layout: One level Discharge Home Access: Stairs to enter Entrance Stairs-Number of Steps: 3 steps at the front and at the side Does the patient have any problems  obtaining your medications?: No  Social/Family/Support Systems Patient Roles: Spouse,  Parent (Has a wife and a son.) Contact Information: Tobie Poet - spouse Anticipated Caregiver: wife Anticipated Caregiver's Contact Information: [name] - wife (h) [phone] ([phone] Ability/Limitations of Caregiver: Wife can assist. Caregiver Availability: 24/7 Discharge Plan Discussed with Primary Caregiver: Yes Is Caregiver In Agreement with Plan?: Yes Does Caregiver/Family have Issues with Lodging/Transportation while Pt is in Rehab?: No  Goals/Additional Needs Patient/Family Goal for Rehab: PT/OT mod I goals Expected length of stay: 7 days Cultural Considerations: Christian Dietary Needs: Heart diet, thin liquids Equipment Needs: TBD Pt/Family Agrees to Admission and willing to participate: Yes Program Orientation Provided & Reviewed with Pt/Caregiver Including Roles & Responsibilities: Yes  Decrease burden of Care through IP rehab admission: N/A  Possible need for SNF placement upon discharge: Not planned  Patient Condition: This patient's medical and functional status has changed since the consult dated: 12/09/15 in which the Rehabilitation Physician determined and documented that the patient's condition is appropriate for intensive rehabilitative care in an inpatient rehabilitation facility. See "History of Present Illness" (above) for medical update. Functional changes are: Currently requiring min assist to ambulate 10 feet RW. Patient's medical and functional status update has been discussed with the Rehabilitation physician and patient remains appropriate for inpatient rehabilitation. Will admit to inpatient rehab today.  Preadmission Screen Completed By: Retta Diones, 12/11/2015 12:24 PM ______________________________________________________________________  Discussed status with Dr. Naaman Plummer on 12/11/15 at 1222 and received telephone approval for admission today.  Admission  Coordinator: Retta Diones, time1224/Date04/28/17          Cosigned by: Meredith Staggers, MD at 12/11/2015 12:26 PM  Revision History     Date/Time User Provider Type Action   12/11/2015 12:26 PM Meredith Staggers, MD Physician Cosign   12/11/2015 12:25 PM Retta Diones, RN Rehab Admission Coordinator Sign

## 2015-12-11 NOTE — Progress Notes (Signed)
Notified by RN that pt became unresponsive, he is an 82 yo male admitted to rehab today  from Dr Josem Kaufmann service for deconditioning after GI bleed.  Vitals are reported as normal, afebrile.  Pt finishing IV abx this week for UTI.  Did have head trauma upon admit to acute but HCT initially nl. Has hx acute on chronic CHF as well as CKD Will order STAT BMET, CBC, HCT, CXR, EKG and troponin Discussed with [facility] consult and admitting MD who will do STAT consult for possible transfer.

## 2015-12-11 NOTE — Progress Notes (Signed)
12/10/15  1540  Called report to [facility]. Spoke with Ed, Rn who will be receiving patient.

## 2015-12-11 NOTE — H&P (View-Only) (Signed)
Physical Medicine and Rehabilitation Admission H&P    Chief Complaint  Patient presents with  . GI Bleeding  : HPI: [name] is a 82 y.o. right handed male with history of atrial fibrillation maintained on chronic Coumadin, coronary artery disease/Pacemaker status post CABG, systolic and diastolic congestive heart failure, type 2 diabetes mellitus, bladder cancer. Lives with wife in [place]. Reports to be independent prior to admission with a general decline over the past few months.Recent fall and hit the corner of his eye on the sink. Presented 12/05/2015 with bright red blood from the rectum as well as a recent fall from the toilet approximately 1 week ago. Patient reports some recent constipation 2 weeks ago but denied any nausea vomiting or abdominal pain. Patient with ongoing active bleeding in the ED. Hemoglobin 6.5. INR 4.86. EKG showed new ST changes and T-wave inversions in the anterior leads. Troponin 0.20. Patient did receive vitamin K fresh frozen plasma 2 units and 1 unit of packed red blood cells. Gastroenterology services consulted Dr. Carlean Purl with findings of a single solitary ulcer in the rectum and in the distal rectum. Underwent epi injection and then cauterization. Close monitoring of hemoglobin status post PRBCs 8 with latest hemoglobin 8.3. Patient's chronic Coumadin currently remains on hold and presently on aspirin.Vascular study bilateral upper extremities 12/10/2015 showed superficial thrombus noted in the right basilic vein and in the left cephalic vein. He has been placed on subcutaneous heparin for DVT prophylaxis and monitored. Current plan is to remain off Coumadin for 2 weeks per gastroenterology services. Follow-up cardiology services with echocardiogram showing ejection fraction of 35%. Akinesis of the anterior inferior lateral and apical myocardium. Placed on intravenous Lasix for acute on chronic systolic and diastolic heart failure and  transition to by mouth. Close monitoring of creatinine 1.89-2.43. Tolerating a regular diet. Urine culture Escherichia coli presently maintained on Rocephin. Physical therapy evaluation completed 12/08/2015 for debilitation with recommendations of physical medicine rehabilitation consult. Patient was admitted for a comprehensive rehabilitation program  ROS Constitutional: Negative for fever and chills.  HENT: Negative for hearing loss.  Eyes: Negative for blurred vision and double vision.  Respiratory: Positive for shortness of breath. Negative for cough.  Cardiovascular: Positive for palpitations and leg swelling. Negative for chest pain.  Gastrointestinal: Positive for constipation. Negative for nausea and vomiting.  Genitourinary: Negative for dysuria and hematuria.  Musculoskeletal: Positive for myalgias, joint pain and falls. Negative for back pain.  Skin: Negative for rash.  Neurological: Positive for weakness. Negative for seizures and headaches.  All other systems reviewed and are negative   Past Medical History  Diagnosis Date  . Atrial fibrillation (HCC)     Chronic  . Coronary artery disease 2004    Status post CABG with LIMA-LAD, SVG-CFX. Hx of anterior apical infarct.  . Renal insufficiency     Chronic  . Hypertension   . Hyperlipidemia   . Gout   . CHF (congestive heart failure) (HCC)     with chronic ischemic cardiomypathy. EF of 10-15%.  CHF due to systolic dysfunction. Clinically doing well.  [name] GERD (gastroesophageal reflux disease)   . Pacemaker     Medtronic biventricular pacemaker, serial number [id number]  . Type II diabetes mellitus ([place])     "been off my RX for ~ 2 yr" (03/20/2013)  . H/O hiatal hernia   . Arthritis     "right thumb" (03/20/2013)  . Depression   . Anxiety   . Bladder  cancer ([place])     "low grade; grade 1; non-invasive" (03/20/2013)  . CKD (chronic kidney disease), stage III    Past Surgical History  Procedure Laterality Date  .  Transurethral resection of prostate  1990's; 2004    "twice; injected chemo 1st time; didn't do that 2nd time" (03/20/2013)  . Nasal septum surgery    . US echocardiography  04-17-2009    Est EF 10-15%  . Cardiovascular stress test  04-17-2009    EF 29%  . Coronary artery bypass graft  02/19/2003    LIMA-LAD, SVG-CFX  . Carotid endarterectomy Right 1990's    "left side is still  100% blocked" (03/20/2013)  . Icd lead removal Left 03/01/2013    Procedure: ICD LEAD REMOVAL;  Surgeon: Evans Lance, MD;  Location: [facility];  Service: Cardiovascular;  Laterality: Left;  . Pacemaker lead removal Left 03/01/2013    Procedure: PACEMAKER LEAD REMOVAL;  Surgeon: Evans Lance, MD;  Location: [facility];  Service: Cardiovascular;  Laterality: Left;  . Pacemaker insertion N/A 03/01/2013    Procedure: INSERTION PACEMAKER LEAD;  Surgeon: Evans Lance, MD;  Location: [facility];  Service: Cardiovascular;  Laterality: N/A;  . Hemorrhoid surgery      "lanced several years ago" (03/20/2013)  . Tonsillectomy and adenoidectomy  1933  . Cataract extraction w/ intraocular lens  implant, bilateral Bilateral ~ 2000  . Bi-ventricular pacemaker insertion N/A 03/20/2013    Procedure: BI-VENTRICULAR PACEMAKER INSERTION (CRT-P);  Surgeon: Evans Lance, MD; Medtronic biventricular pacemaker, serial number [id number]; Laterality: Right   Family History  Problem Relation Age of Onset  . Heart attack Mother    Social History:  reports that he quit smoking about 55 years ago. His smoking use included Cigarettes. He has a 17 pack-year smoking history. He has never used smokeless tobacco. He reports that he drinks alcohol. He reports that he does not use illicit drugs. Allergies:  Allergies  Allergen Reactions  . Penicillins Hives and Itching    Has patient had a PCN reaction causing immediate rash, facial/tongue/throat swelling, SOB or lightheadedness with hypotension: YES Has patient had a PCN reaction causing severe rash involving mucus  membranes or skin necrosis: NO Has patient had a PCN reaction that required hospitalizationNO Has patient had a PCN reaction occurring within the last 10 years: NO If all of the above answers are "NO", then may proceed with Cephalosporin use.   Medications Prior to Admission  Medication Sig Dispense Refill  . allopurinol (ZYLOPRIM) 300 MG tablet Take 300 mg by mouth daily.      [name] ALPRAZolam (XANAX) 0.5 MG tablet Take 1 mg by mouth at bedtime.     . calcium-vitamin D (OSCAL WITH D) 500-200 MG-UNIT per tablet Take 1 tablet by mouth daily.     . digoxin (LANOXIN) 0.125 MG tablet Take 0.0625 mg by mouth daily.     . furosemide (LASIX) 40 MG tablet Take 80 mg by mouth daily.    [name] HYDROcodone-acetaminophen (NORCO/VICODIN) 5-325 MG per tablet Take 0.5 tablets by mouth at bedtime.     [name] ipratropium (ATROVENT) 0.03 % nasal spray Place 1-2 sprays into both nostrils 3 (three) times daily as needed for rhinitis (congestion).   4  . lovastatin (MEVACOR) 20 MG tablet Take 20 mg by mouth at bedtime. Reported on 11/24/2015    . megestrol (MEGACE) 40 MG/ML suspension Take 400 mg by mouth 2 (two) times daily. 10 mls - every morning and at 3pm    .  Melatonin 10 MG TABS Take 10 mg by mouth at bedtime.    . multivitamin-lutein (OCUVITE-LUTEIN) CAPS Take 1 capsule by mouth daily.    . Naphazoline HCl (CLEAR EYES OP) Place 1 drop into both eyes daily as needed (irritation/ eye drop).    [name] spironolactone (ALDACTONE) 25 MG tablet Take 25 mg by mouth daily.    . Triamcinolone Acetonide (NASACORT AQ NA) Place 1 spray into both nostrils daily.    . vitamin B-12 (CYANOCOBALAMIN) 1000 MCG tablet Take 1,000 mcg by mouth daily.    [name] warfarin (COUMADIN) 2 MG tablet Take 1-2 mg by mouth at bedtime. Take 1/2 tablet (1 mg) by mouth Monday, Wednesday, Friday, take 1 tablet (2 mg) on Sunday, Tuesday, Thursday, Saturday      Home: Home Living Family/patient expects to be discharged to:: Private residence Living Arrangements:  Spouse/significant other Available Help at Discharge: Family, Available 24 hours/day ("my wife is 41 yrs younger than me") Type of Home: House Home Equipment: None Additional Comments: Pt provides different answers to same question so home setup unknown   Functional History: Prior Function Level of Independence: Independent Comments: per pt  Functional Status:  Mobility: Bed Mobility Overal bed mobility: Needs Assistance Bed Mobility: Sidelying to Sit, Sit to Supine Sidelying to sit: Min guard Sit to supine: Supervision General bed mobility comments: minguard due to incr effort and reports of dizziness; after returned to bed, pt felt he needed to have BM and assisted to [facility] Transfers Overall transfer level: Needs assistance Equipment used: None Transfers: Sit to/from Stand, Stand Pivot Transfers Sit to Stand: Min guard Stand pivot transfers: Min assist General transfer comment: pt with difficulty sequencing and aligning himself with [facility] Ambulation/Gait Ambulation/Gait assistance: Min assist Ambulation Distance (Feet): 25 Feet Assistive device:  (holding IV pole) Gait Pattern/deviations: Step-through pattern, Decreased stride length General Gait Details: steady Gait velocity interpretation: Below normal speed for age/gender    ADL:    Cognition: Cognition Overall Cognitive Status: Impaired/Different from baseline Orientation Level: Oriented X4 Cognition Arousal/Alertness: Awake/alert Behavior During Therapy: WFL for tasks assessed/performed Overall Cognitive Status: Impaired/Different from baseline Area of Impairment: Memory, Following commands, Safety/judgement, Problem solving Memory: Decreased short-term memory Following Commands: Follows one step commands with increased time Safety/Judgement: Decreased awareness of safety, Decreased awareness of deficits Problem Solving: Slow processing, Decreased initiation, Requires verbal cues, Requires tactile cues  Physical  Exam: Blood pressure 128/73, pulse 89, temperature 98.5 F (36.9 C), temperature source Oral, resp. rate 18, height 5' 8"  (1.727 m), weight 51.166 kg (112 lb 12.8 oz), SpO2 99 %. Physical Exam Constitutional: He is oriented to person, place, and time.  82 year old frail right-handed male  HENT: oral mucosa pink and moist Extensive bruising to the left side of the face.  Eyes: EOM are normal.  Neck: Normal range of motion. Neck supple. No thyromegaly present.  Cardiovascular:  Cardiac rate controlled, irregular Respiratory: Effort normal and breath sounds normal. No wheezes, rales, or rhonchi GI: Soft. Bowel sounds are normal. He exhibits no distension.   .  Skin/ext: 2-3+ edema right upper limb with extensive ecchymosis 1+ edema left upper limb with extensive ecchymosis Small 1.5cm wound near right fibular head.  Neuro: A and O x 3. Answers biographical information. Follows all commands. Good insight and awareness. Motor strength is 3+ in the right deltoid, biceps, triceps, grip (limited by pain,swelling)) 4/5 in the left deltoid, biceps, triceps, grip 3+ in bilateral hip flexor and, 4 knee extensor and ankle dorsiflexor/plantarflexor Psych: pleasant and  cooperative  Results for orders placed or performed during the hospital encounter of 12/04/15 (from the past 48 hour(s))  Glucose, capillary     Status: Abnormal   Collection Time: 12/07/15  3:01 PM  Result Value Ref Range   Glucose-Capillary 173 (H) 65 - 99 mg/dL  CBC     Status: Abnormal   Collection Time: 12/07/15  6:12 PM  Result Value Ref Range   WBC 18.9 (H) 4.0 - 10.5 K/uL   RBC 2.66 (L) 4.22 - 5.81 MIL/uL   Hemoglobin 7.8 (L) 13.0 - 17.0 g/dL   HCT 22.8 (L) 39.0 - 52.0 %   MCV 85.7 78.0 - 100.0 fL   MCH 29.3 26.0 - 34.0 pg   MCHC 34.2 30.0 - 36.0 g/dL   RDW 14.8 11.5 - 15.5 %   Platelets 89 (L) 150 - 400 K/uL    Comment: CONSISTENT WITH PREVIOUS RESULT  Glucose, capillary     Status: Abnormal   Collection Time:  12/07/15  8:33 PM  Result Value Ref Range   Glucose-Capillary 256 (H) 65 - 99 mg/dL  Glucose, capillary     Status: Abnormal   Collection Time: 12/07/15 11:55 PM  Result Value Ref Range   Glucose-Capillary 179 (H) 65 - 99 mg/dL   Comment 1 Notify RN    Comment 2 Document in Chart   Protime-INR     Status: Abnormal   Collection Time: 12/08/15  4:25 AM  Result Value Ref Range   Prothrombin Time 21.4 (H) 11.6 - 15.2 seconds   INR 1.86 (H) 0.00 - 1.49  CBC     Status: Abnormal   Collection Time: 12/08/15  4:25 AM  Result Value Ref Range   WBC 21.8 (H) 4.0 - 10.5 K/uL   RBC 2.71 (L) 4.22 - 5.81 MIL/uL   Hemoglobin 8.0 (L) 13.0 - 17.0 g/dL   HCT 23.3 (L) 39.0 - 52.0 %   MCV 86.0 78.0 - 100.0 fL   MCH 29.5 26.0 - 34.0 pg   MCHC 34.3 30.0 - 36.0 g/dL   RDW 15.2 11.5 - 15.5 %   Platelets 106 (L) 150 - 400 K/uL    Comment: SPECIMEN CHECKED FOR CLOTS REPEATED TO VERIFY CONSISTENT WITH PREVIOUS RESULT   Glucose, capillary     Status: None   Collection Time: 12/08/15  6:26 AM  Result Value Ref Range   Glucose-Capillary 98 65 - 99 mg/dL  Basic metabolic panel     Status: Abnormal   Collection Time: 12/08/15  9:13 AM  Result Value Ref Range   Sodium 129 (L) 135 - 145 mmol/L   Potassium 5.0 3.5 - 5.1 mmol/L   Chloride 104 101 - 111 mmol/L   CO2 19 (L) 22 - 32 mmol/L   Glucose, Bld 98 65 - 99 mg/dL   BUN 54 (H) 6 - 20 mg/dL   Creatinine, Ser 1.36 (H) 0.61 - 1.24 mg/dL   Calcium 7.7 (L) 8.9 - 10.3 mg/dL   GFR calc non Af Amer 45 (L) >60 mL/min   GFR calc Af Amer 52 (L) >60 mL/min    Comment: (NOTE) The eGFR has been calculated using the CKD EPI equation. This calculation has not been validated in all clinical situations. eGFR's persistently <60 mL/min signify possible Chronic Kidney Disease.    Anion gap 6 5 - 15  Hemoglobin A1c     Status: Abnormal   Collection Time: 12/08/15  9:17 AM  Result Value Ref Range   Hgb A1c  MFr Bld 5.9 (H) 4.8 - 5.6 %    Comment: (NOTE)          Pre-diabetes: 5.7 - 6.4         Diabetes: >6.4         Glycemic control for adults with diabetes: <7.0    Mean Plasma Glucose 123 mg/dL    Comment: (NOTE) Performed At: [facility] [place], [place] [id number] Lindon Romp MD EY:[id number]   Glucose, capillary     Status: Abnormal   Collection Time: 12/08/15  4:30 PM  Result Value Ref Range   Glucose-Capillary 151 (H) 65 - 99 mg/dL  Protime-INR     Status: Abnormal   Collection Time: 12/09/15  4:53 AM  Result Value Ref Range   Prothrombin Time 19.5 (H) 11.6 - 15.2 seconds   INR 1.65 (H) 0.00 - 1.49  CBC     Status: Abnormal   Collection Time: 12/09/15  4:53 AM  Result Value Ref Range   WBC 19.9 (H) 4.0 - 10.5 K/uL   RBC 2.73 (L) 4.22 - 5.81 MIL/uL   Hemoglobin 8.3 (L) 13.0 - 17.0 g/dL   HCT 24.1 (L) 39.0 - 52.0 %   MCV 88.3 78.0 - 100.0 fL   MCH 30.4 26.0 - 34.0 pg   MCHC 34.4 30.0 - 36.0 g/dL   RDW 15.1 11.5 - 15.5 %   Platelets 130 (L) 150 - 400 K/uL  Basic metabolic panel     Status: Abnormal   Collection Time: 12/09/15  4:53 AM  Result Value Ref Range   Sodium 128 (L) 135 - 145 mmol/L   Potassium 5.0 3.5 - 5.1 mmol/L   Chloride 100 (L) 101 - 111 mmol/L   CO2 18 (L) 22 - 32 mmol/L   Glucose, Bld 153 (H) 65 - 99 mg/dL   BUN 60 (H) 6 - 20 mg/dL   Creatinine, Ser 1.74 (H) 0.61 - 1.24 mg/dL   Calcium 7.7 (L) 8.9 - 10.3 mg/dL   GFR calc non Af Amer 33 (L) >60 mL/min   GFR calc Af Amer 39 (L) >60 mL/min    Comment: (NOTE) The eGFR has been calculated using the CKD EPI equation. This calculation has not been validated in all clinical situations. eGFR's persistently <60 mL/min signify possible Chronic Kidney Disease.    Anion gap 10 5 - 15  Glucose, capillary     Status: Abnormal   Collection Time: 12/09/15  5:29 AM  Result Value Ref Range   Glucose-Capillary 156 (H) 65 - 99 mg/dL   Comment 1 Notify RN    Comment 2 Document in Chart   Glucose, capillary     Status: Abnormal   Collection  Time: 12/09/15  9:06 AM  Result Value Ref Range   Glucose-Capillary 123 (H) 65 - 99 mg/dL   Dg Chest Port 1 View  12/08/2015  CLINICAL DATA:  Shortness of breath EXAM: PORTABLE CHEST 1 VIEW COMPARISON:  12/06/2015 FINDINGS: Chronic cardiomegaly. Biventricular pacer from the right with leads in stable position. Status post CABG. New hazy appearance at the right base with costophrenic sulcus blunting. No pneumothorax. Remote mid left clavicle fracture. IMPRESSION: New small right pleural effusion. Electronically Signed   By: Monte Fantasia M.D.   On: 12/08/2015 06:31       Medical Problem List and Plan: 1.  Deconditioning secondary to GI bleed status post epi injection and cauterization/multi-medical  -admit to inpatient rehab today 2.  DVT Prophylaxis/Anticoagulation:Bilateral upper  extremity venous Doppler study showed a superficial thrombus right basilic vein and in the left cephalic vein. Subcutaneous heparin for now. Monitor platelet counts and any signs of bleeding. 3. Pain Management: Tylenol as needed 4. CAD status post CABG. No chest pain or shortness of breath 5. Neuropsych: This patient is capable of making decisions on his own behalf. 6. Skin/Wound Care: Routine skin checks. Local care to bruises, RUE edema, various wounds 7. Fluids/Electrolytes/Nutrition: Routine I&O's with follow-up chemistries 8. Atrial fibrillation. Coumadin currently remains on hold due to GI bleed 2 weeks then resume. Lanoxin 0.625 mg daily. Presently on aspirin. Cardiac rate control 9. Hypertension. Coreg 3.125 mg twice a day, Lanoxin 0.625 mg daily, Aldactone 25 mg daily 10. Systolic diastolic congestive heart failure. Continue Lasix 80 mg daily. Weight patient daily. Monitor for any signs of fluid overload 11. Anemia. Follow-up CBC 12. Diet-controlled Diabetes mellitus. Hemoglobin A1c 5.9. Check blood sugars before meals and at bedtime. Continue sliding scale 13. Escherichia coli urinary tract infection.  Complete Rocephin 14. Gout. Zyloprim 300 mg daily. Monitor for any gout flareups 15. Hyperlipidemia. Pravachol 16. Constipation. Miralax   Post Admission Physician Evaluation: 1. Functional deficits secondary  to debility after GI bleed/fall. 2. Patient is admitted to receive collaborative, interdisciplinary care between the physiatrist, rehab nursing staff, and therapy team. 3. Patient's level of medical complexity and substantial therapy needs in context of that medical necessity cannot be provided at a lesser intensity of care such as a SNF. 4. Patient has experienced substantial functional loss from his/her baseline which was documented above under the "Functional History" and "Functional Status" headings.  Judging by the patient's diagnosis, physical exam, and functional history, the patient has potential for functional progress which will result in measurable gains while on inpatient rehab.  These gains will be of substantial and practical use upon discharge  in facilitating mobility and self-care at the household level. 5. Physiatrist will provide 24 hour management of medical needs as well as oversight of the therapy plan/treatment and provide guidance as appropriate regarding the interaction of the two. 6. 24 hour rehab nursing will assist with bladder management, bowel management, safety, skin/wound care, disease management, medication administration, pain management and patient education  and help integrate therapy concepts, techniques,education, etc. 7. PT will assess and treat for/with: Lower extremity strength, range of motion, stamina, balance, functional mobility, safety, adaptive techniques and equipment, NMR, ego support, education, community reintegration.   Goals are: mod I. 8. OT will assess and treat for/with: ADL's, functional mobility, safety, upper extremity strength, adaptive techniques and equipment.   Goals are: mod I. Therapy may proceed with showering this patient. 9. SLP  will assess and treat for/with: n/a.  Goals are: n/a. 10. Case Management and Social Worker will assess and treat for psychological issues and discharge planning. 11. Team conference will be held weekly to assess progress toward goals and to determine barriers to discharge. 12. Patient will receive at least 3 hours of therapy per day at least 5 days per week. 13. ELOS: 7 days       14. Prognosis:  excellent     Meredith Staggers, MD, [facility] 12/11/2015

## 2015-12-11 NOTE — Progress Notes (Signed)
Rehab admissions - I am admitting patient to inpatient rehab today.  We do have a bed available.  Call me for questions.  RC:[phone]

## 2015-12-11 NOTE — Progress Notes (Signed)
     SUBJECTIVE: No complaints. No chest pain or dyspnea.   Tele: sinus  BP 105/45 mmHg  Pulse 91  Temp(Src) 98.2 F (36.8 C) (Oral)  Resp 20  Ht 5\' 8"  (1.727 m)  Wt 108 lb (48.988 kg)  BMI 16.42 kg/m2  SpO2 100%  Intake/Output Summary (Last 24 hours) at 12/11/15 1013 Last data filed at 12/11/15 0853  Gross per 24 hour  Intake    120 ml  Output   1175 ml  Net  -1055 ml    PHYSICAL EXAM General: Well developed, well nourished, in no acute distress. Alert and oriented x 3.  Psych:  Good affect, responds appropriately Neck: No JVD. No masses noted.  Lungs: Clear bilaterally with no wheezes or rhonci noted.  Heart: RRR with no murmurs noted. Abdomen: Bowel sounds are present. Soft, non-tender.  Extremities: No lower extremity edema.   LABS: Basic Metabolic Panel:  Recent Labs  12/10/15 0308 12/11/15 0224  NA 133* 135  K 4.7 4.3  CL 101 100*  CO2 21* 23  GLUCOSE 203* 160*  BUN 64* 57*  CREATININE 1.53* 1.46*  CALCIUM 7.8* 8.0*   CBC:  Recent Labs  12/10/15 0308 12/11/15 0224  WBC 20.2* 16.4*  HGB 8.6* 8.5*  HCT 25.7* 25.7*  MCV 89.2 90.5  PLT 192 199   Current Meds: . allopurinol  300 mg Oral Daily  . aspirin  81 mg Oral Daily  . carvedilol  3.125 mg Oral BID WC  . cefTRIAXone (ROCEPHIN)  IV  1 g Intravenous Q24H  . digoxin  0.0625 mg Oral Daily  . docusate sodium  200 mg Oral BID  . feeding supplement (ENSURE ENLIVE)  237 mL Oral TID BM  . furosemide  80 mg Oral Daily  . heparin subcutaneous  5,000 Units Subcutaneous Q8H  . insulin aspart  0-5 Units Subcutaneous QHS  . insulin aspart  0-9 Units Subcutaneous TID WC  . polyethylene glycol  17 g Oral Daily  . pravastatin  40 mg Oral q1800  . spironolactone  25 mg Oral Daily  . vitamin B-12  1,000 mcg Oral Daily     ASSESSMENT AND PLAN:  1. Acute on chronic systolic and diastolic heart failure, NYHA Class 3: Volume status is near baseline. He is now on po Lasix. Will continue beta blocker.  No ACE-I secondary to renal insufficiency.   2.Acute Lower GI bleed: rectal bleed, s/p cautery.  Bowel regimen per GI. GI has signed off, ok to add ASA 81 mg and restart Coumadin in several weeks.  3. Paroxysmal Atrial FIbrillation: none seen on last device interrogation. This patients CHA2DS2-VASc Score and unadjusted Ischemic Stroke Rate (% per year) is equal to 9.7 % stroke rate/year from a score of 6 (CHF, HTN, DM, Vascular, Age (2)). Coumadin held in setting of GI bleed. Continue BB and Digoxin.  4. Complete Heart Block - s/p PPM in 03/2013  5. Right Upper Extremity Swelling: superficial thrombus on venous u/s 12/10/15.   Cardiology will sign off. He can f/u with Dr. Lovena Le in our office in 1-2 weeks.    [name],[name]  4/28/201710:13 AM

## 2015-12-11 NOTE — PMR Pre-admission (Signed)
PMR Admission Coordinator Pre-Admission Assessment  Patient: [name] is an 82 y.o., male MRN: [id number] DOB: 1925-03-21 Height: 5\' 8"  (172.7 cm) Weight: 48.988 kg (108 lb) (scale B)              Insurance Information HMO: No    PPO:       PCP:       IPA:       80/20:       OTHER:   PRIMARY:  Medicare A/B      Policy#: [id number]      Subscriber: Barbaraann Rondo CM Name:        Phone#:       Fax#:   Pre-Cert#:        Employer:  Retired Benefits:  Phone #:       Name: Checked in [place]. Date: 01/14/92     Deduct: $1316      Out of Pocket Max: None      Life Max: unlimited CIR: 100%      SNF: 100 days Outpatient: 80%     Co-Pay: 20% Home Health: 100%      Co-Pay: none DME: 80%     Co-Pay: 20% Providers: patient's choice  SECONDARY: [employer] indemnity      Policy#: [id number]      Subscriber: Barbaraann Rondo CM Name:        Phone#:       Fax#:   Pre-Cert#:        Employer: Retired Benefits:  Phone #: [phone]     Name:   Eff. Date:       Deduct:        Out of Pocket Max:        Life Max:   CIR:        SNF:   Outpatient:       Co-Pay:   Home Health:        Co-Pay:   DME:       Co-Pay:    Emergency Contact Information Contact Information    Name Relation Home Work Mobile   [place] Spouse [phone]  [phone]     Current Medical History  Patient Admitting Diagnosis:  Debility/GI bleed/Fall  History of Present Illness: An 82 y.o. right handed male with history of atrial fibrillation maintained on chronic Coumadin, coronary artery disease/Pacemaker status post CABG, systolic and diastolic congestive heart failure, type 2 diabetes mellitus, bladder cancer. Lives with wife in [place]. Reports to be independent prior to admission with a general decline over the past few months.Recent fall and hit the corner of his eye on the sink. Presented 12/05/2015 with bright red blood from the rectum as well as a recent fall from the toilet approximately 1 week ago.  Patient reports some recent constipation 2 weeks ago but denied any nausea vomiting or abdominal pain. Patient with ongoing active bleeding in the ED. Hemoglobin 6.5. INR 4.86. EKG showed new ST changes and T-wave inversions in the anterior leads. Troponin 0.20. Patient did receive vitamin K fresh frozen plasma 2 units and 1 unit of packed red blood cells. Gastroenterology services consulted Dr. Carlean Purl with findings of a single solitary ulcer in the rectum and in the distal rectum. Underwent epi injection and then cauterization. Close monitoring of hemoglobin status post PRBCs 8 with latest hemoglobin 8.3. Patient's chronic Coumadin currently remains on hold and presently on aspirin.Vascular study bilateral upper extremities 12/10/2015 showed superficial thrombus noted in the right basilic vein and in  the left cephalic vein. He has been placed on subcutaneous heparin for DVT prophylaxis and monitored. Current plan is to remain off Coumadin for 2 weeks per gastroenterology services. Follow-up cardiology services with echocardiogram showing ejection fraction of 35%. Akinesis of the anterior inferior lateral and apical myocardium. Placed on intravenous Lasix for acute on chronic systolic and diastolic heart failure and transition to by mouth. Close monitoring of creatinine 1.89-2.43. Tolerating a regular diet. Urine culture Escherichia coli presently maintained on Rocephin. Physical therapy evaluation completed 12/08/2015 for debilitation with recommendations of physical medicine rehabilitation consult. Patient to be  admitted for a comprehensive inpatient rehabilitation program.  Past Medical History  Past Medical History  Diagnosis Date  . Atrial fibrillation (HCC)     Chronic  . Coronary artery disease 2004    Status post CABG with LIMA-LAD, SVG-CFX. Hx of anterior apical infarct.  . Renal insufficiency     Chronic  . Hypertension   . Hyperlipidemia   . Gout   . CHF (congestive heart failure) (HCC)      with chronic ischemic cardiomypathy. EF of 10-15%.  CHF due to systolic dysfunction. Clinically doing well.  [name] GERD (gastroesophageal reflux disease)   . Pacemaker     Medtronic biventricular pacemaker, serial number [id number]  . Type II diabetes mellitus ([place])     "been off my RX for ~ 2 yr" (03/20/2013)  . H/O hiatal hernia   . Arthritis     "right thumb" (03/20/2013)  . Depression   . Anxiety   . Bladder cancer ([place])     "low grade; grade 1; non-invasive" (03/20/2013)  . CKD (chronic kidney disease), stage III     Family History  family history includes Heart attack in his mother.  Prior Rehab/Hospitalizations: No previous rehab  Has the patient had major surgery during 100 days prior to admission? No  Current Medications   Current facility-administered medications:  .  acetaminophen (TYLENOL) tablet 650 mg, 650 mg, Oral, Q4H PRN, Bethena Roys, MD, 650 mg at 12/08/15 2128 .  allopurinol (ZYLOPRIM) tablet 300 mg, 300 mg, Oral, Daily, Thurnell Lose, MD, 300 mg at 12/11/15 0930 .  aspirin chewable tablet 81 mg, 81 mg, Oral, Daily, Thurnell Lose, MD, 81 mg at 12/11/15 0931 .  carvedilol (COREG) tablet 3.125 mg, 3.125 mg, Oral, BID WC, Ripudeep K Rai, MD, 3.125 mg at 12/11/15 0803 .  cefTRIAXone (ROCEPHIN) 1 g in dextrose 5 % 50 mL IVPB, 1 g, Intravenous, Q24H, Thurnell Lose, MD, 1 g at 12/11/15 1156 .  digoxin (LANOXIN) tablet 0.0625 mg, 0.0625 mg, Oral, Daily, Thurnell Lose, MD, 0.0625 mg at 12/11/15 0931 .  docusate sodium (COLACE) capsule 200 mg, 200 mg, Oral, BID, Thurnell Lose, MD, 200 mg at 12/11/15 0931 .  feeding supplement (ENSURE ENLIVE) (ENSURE ENLIVE) liquid 237 mL, 237 mL, Oral, TID BM, [place] Nickel, MD, 237 mL at 12/10/15 2000 .  furosemide (LASIX) tablet 80 mg, 80 mg, Oral, Daily, Brittany M Strad[place], [place], 80 mg at 12/11/15 0930 .  heparin injection 5,000 Units, 5,000 Units, Subcutaneous, Q8H, Thurnell Lose, MD, 5,000 Units at 12/11/15  [phone] .  insulin aspart (novoLOG) injection 0-5 Units, 0-5 Units, Subcutaneous, QHS, Maryellen Pile, MD, 3 Units at 12/07/15 2127 .  insulin aspart (novoLOG) injection 0-9 Units, 0-9 Units, Subcutaneous, TID WC, Maryellen Pile, MD, 2 Units at 12/11/15 0700 .  ipratropium (ATROVENT) 0.06 % nasal spray 1-2 spray, 1-2 spray, Each Nare, TID  PRN, Bethena Roys, MD .  ondansetron (ZOFRAN) injection 4 mg, 4 mg, Intravenous, Q6H PRN, Tammy S Parrett, NP, 4 mg at 12/05/15 1002 .  [COMPLETED] polyethylene glycol (MIRALAX / GLYCOLAX) packet 32 g, 32 g, Oral, BID, 32 g at 12/07/15 2127 **FOLLOWED BY** polyethylene glycol (MIRALAX / GLYCOLAX) packet 17 g, 17 g, Oral, Daily, Charlynne Cousins [place], PA-C, 17 g at 12/11/15 0930 .  pravastatin (PRAVACHOL) tablet 40 mg, 40 mg, Oral, q1800, Thurnell Lose, MD, 40 mg at 12/10/15 1711 .  spironolactone (ALDACTONE) tablet 25 mg, 25 mg, Oral, Daily, Fransisco Hertz [place], [place], 25 mg at 12/11/15 0931 .  vitamin B-12 (CYANOCOBALAMIN) tablet 1,000 mcg, 1,000 mcg, Oral, Daily, Thurnell Lose, MD, 1,000 mcg at 12/11/15 0930 .  zolpidem (AMBIEN) tablet 5 mg, 5 mg, Oral, QHS PRN, Etta Quill, DO, 5 mg at 12/10/15 2128  Patients Current Diet: Diet Heart Room service appropriate?: Yes; Fluid consistency:: Thin  Precautions / Restrictions Precautions Precautions: Fall Precaution Comments: fell gettting off toilet just prior to admission Restrictions Weight Bearing Restrictions: No   Has the patient had 2 or more falls or a fall with injury in the past year?Yes.  Has suffered 2 falls with injury.  Prior Activity Level Limited Community (1-2x/wk): Went out about 2-3  X a week to the doctor or to the grocery store.  Home Assistive Devices / Equipment Home Assistive Devices/Equipment: None Home Equipment: None  Prior Device Use: Indicate devices/aids used by the patient prior to current illness, exacerbation or injury? None  Prior Functional Level Prior  Function Level of Independence: Independent Comments: per pt  Self Care: Did the patient need help bathing, dressing, using the toilet or eating?  Independent  Indoor Mobility: Did the patient need assistance with walking from room to room (with or without device)? Independent  Stairs: Did the patient need assistance with internal or external stairs (with or without device)? Independent  Functional Cognition: Did the patient need help planning regular tasks such as shopping or remembering to take medications? Independent  Current Functional Level Cognition  Overall Cognitive Status: Impaired/Different from baseline Orientation Level: Oriented X4 Following Commands: Follows one step commands with increased time Safety/Judgement: Decreased awareness of safety, Decreased awareness of deficits    Extremity Assessment (includes Sensation/Coordination)  Upper Extremity Assessment: Generalized weakness  Lower Extremity Assessment: Generalized weakness    ADLs  Anticipate ADL needs     Mobility  Overal bed mobility: Needs Assistance Bed Mobility: Sidelying to Sit, Rolling Rolling: Min assist Sidelying to sit: Min assist Sit to supine: Supervision General bed mobility comments: heavy use of rail    Transfers  Overall transfer level: Needs assistance Equipment used: Rolling walker (2 wheeled) Transfers: Sit to/from Stand Sit to Stand: Min assist Stand pivot transfers: Min assist General transfer comment: x 3 (various surfaces) reports some dizziness that resolved after 15 seconds    Ambulation / Gait / Stairs / Wheelchair Mobility  Ambulation/Gait Ambulation/Gait assistance: Museum/gallery curator (Feet): 10 Feet Assistive device: Rolling walker (2 wheeled) (holding IV pole) Gait Pattern/deviations: Step-through pattern, Decreased stance time - left, Shuffle General Gait Details: required assist to guide/steer RW around end of bed Gait velocity interpretation: Below  normal speed for age/gender    Posture / Balance Balance Overall balance assessment: Needs assistance, History of Falls Sitting-balance support: No upper extremity supported, Feet unsupported Sitting balance-Leahy Scale: Good Standing balance support: No upper extremity supported Standing balance-Leahy Scale: Poor    Special  needs/care consideration BiPAP/CPAP No CPM No Continuous Drip IV No  Dialysis No        Life Vest No Oxygen No Special Bed No Trach Size No Wound Vac (area) No      Skin Has bruising on left face, bruising on both lower extremities                            Bowel mgmt: Last BM 12/09/15 Bladder mgmt: Voiding in urinal Diabetic mgmt Yes, but has been off medications for the past 3 years    Previous Home Environment Living Arrangements: Spouse/significant other Available Help at Discharge: Family, Available 24 hours/day ("my wife is 48 yrs younger than me") Type of Home: [place]: No Additional Comments: Pt provides different answers to same question so home setup unknown  Discharge Living Setting Plans for Discharge Living Setting: Patient's home, House, Lives with (comment) (Lives with wife and their dog, Dimple Nanas, who is a Art therapist) Type of Home at Discharge: House Discharge Home Layout: One level Discharge Home Access: Stairs to enter Entrance Stairs-Number of Steps: 3 steps at the front and at the side Does the patient have any problems obtaining your medications?: No  Social/Family/Support Systems Patient Roles: Spouse, Parent (Has a wife and a son.) Contact Information: Tobie Poet - spouse Anticipated Caregiver: wife Anticipated Caregiver's Contact Information: [name] - wife (h) [phone] (c) [phone] Ability/Limitations of Caregiver: Wife can assist. Caregiver Availability: 24/7 Discharge Plan Discussed with Primary Caregiver: Yes Is Caregiver In Agreement with Plan?: Yes Does Caregiver/Family have Issues with  Lodging/Transportation while Pt is in Rehab?: No  Goals/Additional Needs Patient/Family Goal for Rehab: PT/OT mod I goals Expected length of stay: 7 days Cultural Considerations: Christian Dietary Needs: Heart diet, thin liquids Equipment Needs: TBD Pt/Family Agrees to Admission and willing to participate: Yes Program Orientation Provided & Reviewed with Pt/Caregiver Including Roles  & Responsibilities: Yes  Decrease burden of Care through IP rehab admission: N/A  Possible need for SNF placement upon discharge: Not planned  Patient Condition: This patient's medical and functional status has changed since the consult dated: 12/09/15 in which the Rehabilitation Physician determined and documented that the patient's condition is appropriate for intensive rehabilitative care in an inpatient rehabilitation facility. See "History of Present Illness" (above) for medical update. Functional changes are: Currently requiring min assist to ambulate 10 feet RW. Patient's medical and functional status update has been discussed with the Rehabilitation physician and patient remains appropriate for inpatient rehabilitation. Will admit to inpatient rehab today.  Preadmission Screen Completed By:  Retta Diones, 12/11/2015 12:24 PM ______________________________________________________________________   Discussed status with Dr. Naaman Plummer on 12/11/15 at 1222 and received telephone approval for admission today.  Admission Coordinator:  Retta Diones, time1224/Date04/28/17

## 2015-12-11 NOTE — Discharge Summary (Addendum)
Physician Discharge Summary   Patient ID: [name] MRN: [id number] DOB/AGE: 80/17/1926 80 y.o.  Admit date: 12/04/2015 Discharge date: 12/11/2015  Primary Care Physician:  Donnajean Lopes, MD  Discharge Diagnoses:    . GI bleed . Hemorrhagic, Circulatory shock . Escherichia coli UTI  . Rectal/anal ulcer . acute on chronic systolic and diastolic CHF  . Lactic acidosis . Bleeding gastrointestinal . Coagulopathy ([place]) . Acute blood loss anemia . CKD (chronic kidney disease), stage III    Upper extremity edema  Consults: Cardiology  Recommendations for inpatient rehabilitation 1. Please continue IV Rocephin for 3 more days to complete the full course of antibiotics 2. Please repeat CBC/BMET at next visit 3. Please note warfarin has been discontinued from his outpatient medications due to GI bleed. Per gastroenterology hold her Coumadin for 2 weeks, continue 81 mg aspirin, avoid constipation   DIET: Carb modified diet    Allergies:   Allergies  Allergen Reactions  . Penicillins Hives and Itching    Has patient had a PCN reaction causing immediate rash, facial/tongue/throat swelling, SOB or lightheadedness with hypotension: YES Has patient had a PCN reaction causing severe rash involving mucus membranes or skin necrosis: NO Has patient had a PCN reaction that required hospitalizationNO Has patient had a PCN reaction occurring within the last 10 years: NO If all of the above answers are "NO", then may proceed with Cephalosporin use.     DISCHARGE MEDICATIONS: Current Discharge Medication List    CONTINUE these medications which have NOT CHANGED   Details  allopurinol (ZYLOPRIM) 300 MG tablet Take 300 mg by mouth daily.      ALPRAZolam (XANAX) 0.5 MG tablet Take 1 mg by mouth at bedtime.     calcium-vitamin D (OSCAL WITH D) 500-200 MG-UNIT per tablet Take 1 tablet by mouth daily.     digoxin (LANOXIN) 0.125 MG tablet Take 0.0625 mg by mouth daily.      furosemide (LASIX) 40 MG tablet Take 80 mg by mouth daily.    HYDROcodone-acetaminophen (NORCO/VICODIN) 5-325 MG per tablet Take 0.5 tablets by mouth at bedtime.     ipratropium (ATROVENT) 0.03 % nasal spray Place 1-2 sprays into both nostrils 3 (three) times daily as needed for rhinitis (congestion).  Refills: 4    lovastatin (MEVACOR) 20 MG tablet Take 20 mg by mouth at bedtime. Reported on 11/24/2015    megestrol (MEGACE) 40 MG/ML suspension Take 400 mg by mouth 2 (two) times daily. 10 mls - every morning and at 3pm    Melatonin 10 MG TABS Take 10 mg by mouth at bedtime.    multivitamin-lutein (OCUVITE-LUTEIN) CAPS Take 1 capsule by mouth daily.    Naphazoline HCl (CLEAR EYES OP) Place 1 drop into both eyes daily as needed (irritation/ eye drop).    Triamcinolone Acetonide (NASACORT AQ NA) Place 1 spray into both nostrils daily.    vitamin B-12 (CYANOCOBALAMIN) 1000 MCG tablet Take 1,000 mcg by mouth daily.      STOP taking these medications     spironolactone (ALDACTONE) 25 MG tablet      warfarin (COUMADIN) 2 MG tablet x HOLD FOR 2 WEEKS         Inpatient medications Aspirin 81 mg daily Coreg 3.125 twice a day Rocephin 1 g IV every 24 hours for 3 more days, and a date 5/1 Colace 200 mg twice a day MiraLAX 17 g daily Aldactone 25 mg daily  (Please continue Coreg, Colace, MiraLAX, Aldactone AT FINAL DISCHARGE FROM CIR.  patient has to hold warfarin for 2 weeks. Until then continue aspirin 81 mg daily)   Brief H and P: For complete details please refer to admission H and P, but in brief82 year old male with history of atrial fibrillation on Coumadin, CAD status post CABG, CHF, type 2 diabetes mellitus, bladder cancer who was admitted initially for GI bleed, bright red blood per rectum causing anemia requiring transfusion and ICU admission. He was seen by GI. He was found to have a supratherapeutic INR requiring FFP and vitamin K, underwent flexible sigmoidoscopy showing a  small rectal ulcer which was bleeding likely caused by constipation.  He also developed acute renal failure, massive edema in upper extremities and generalized weakness. Transferred to the floor on 4/25 from ICU.   Hospital Course:  Acute lower GI bleed associated anemia with Circulatory shock in the setting of Coumadin and supratherapeutic INR - Shock has resolved - INR was reversed, Coumadin was placed on hold, received 6 units of packed RBCs in the ICU.  - H&H now stable. Hemoglobin 8.5 -GI was consulted, patient underwent tagged RBC scan which showed GI bleeding source from sigmoid colon.  - Flexible sigmoidoscopy showed rectal ulcer with bleed which was cauterized, ulcer was likely caused by anal fissure/ constipation. - Per GI hold Coumadin for 2 weeks, 81 mg of aspirin, monitor H&H, avoid constipation.    Leukocytosis due to Escherichia coli UTI. - Patient was placed on IV Rocephin, discontinued Foley. Will continue for total 7 days, end date 5/1  Acute on chronic systolic and diastolic CHF, NYHA class 3/ Ischemic cardiomyopathy - Started on IV Lasix 40 mg twice a day, cardiology has been following closely. Patient was transitioned to oral Lasix on 4/27. 2-D echo 4/24 showed EF of 30-35%. PA pressure 64. -Continue on low-dose Coreg, no ACE/ARB due to renal failure.  Chronic atrial fibrillation, complete heart block - [place] VASC 6, Coumadin currently held in the setting of GI bleed. Patient was followed closely by cardiology. - Placed on low-dose aspirin as recommended by GI, warfarin on hold for 2 weeks. - Status post pacemaker in 2014  CAD. Status post CABG. Chest pain-free - mild troponin bump likely demand ischemia from anemia - on low-dose aspirin, continue statin, low-dose beta blocker.   Dyslipidemia. Replaced on statin.  Essential hypertension. - ACE inhibitor on hold due to ARF, continue Aldactone, Lasix, low-dose beta blocker and monitor.  Acute kidney  injury -Creatinine bumped to 2.43 at the time of admission, likely due to shock from anemia - Now improving, 1.4 today.  -Currently on Lasix and Aldactone, follow creatinine. No ACE/ARB or any nephrotoxic medications.    History of gout. On allopurinol.  Upper extremity edema - Doppler ultrasound showed no DVT or SVT in the bilateral upper extremities, superficial thrombosis in the right basilic vein and left cephalic vein. Elevate arm, warm compresses   Day of Discharge BP 105/45 mmHg  Pulse 91  Temp(Src) 98.2 F (36.8 C) (Oral)  Resp 20  Ht 5\' 8"  (1.727 m)  Wt 48.988 kg (108 lb)  BMI 16.42 kg/m2  SpO2 100%  Subjective Feeling better, wants to sleep. Denies any complaints of chest pain, dizziness, nausea, vomiting, abdominal pain. No fevers or chills.  Physical Exam: General: Alert and awake oriented, not in any acute distress. HEENT: anicteric sclera, pupils reactive to light and accommodation CVS: S1-S2 clear no murmur rubs or gallops Chest: clear to auscultation bilaterally, no wheezing rales or rhonchi Abdomen: soft nontender, nondistended, normal bowel sounds  Extremities: no cyanosis, clubbing or edema noted bilaterally, Swelling on the upper extremities improving,    The results of significant diagnostics from this hospitalization (including imaging, microbiology, ancillary and laboratory) are listed below for reference.    LAB RESULTS: Basic Metabolic Panel:  Recent Labs Lab 12/06/15 0518  12/10/15 0308 12/11/15 0224  NA 130*  < > 133* 135  K 5.3*  < > 4.7 4.3  CL 105  < > 101 100*  CO2 17*  < > 21* 23  GLUCOSE 94  < > 203* 160*  BUN 74*  < > 64* 57*  CREATININE 1.79*  < > 1.53* 1.46*  CALCIUM 6.5*  < > 7.8* 8.0*  MG 1.4*  --   --   --   PHOS 4.0  --   --   --   < > = values in this interval not displayed. Liver Function Tests:  Recent Labs Lab 12/04/15 2350  AST 44*  ALT 45  ALKPHOS 134*  BILITOT 1.1  PROT 6.1*  ALBUMIN 3.4*    Recent  Labs Lab 12/04/15 2350  LIPASE 52*   No results for input(s): AMMONIA in the last 168 hours. CBC:  Recent Labs Lab 12/05/15 1002  12/10/15 0308 12/11/15 0224  WBC 13.6*  < > 20.2* 16.4*  NEUTROABS 11.9*  --   --   --   HGB 6.9*  < > 8.6* 8.5*  HCT 20.7*  < > 25.7* 25.7*  MCV 93.2  < > 89.2 90.5  PLT 81*  < > 192 199  < > = values in this interval not displayed. Cardiac Enzymes:  Recent Labs Lab 12/05/15 1520 12/05/15 1843  TROPONINI 0.13* 0.20*   BNP: Invalid input(s): POCBNP CBG:  Recent Labs Lab 12/10/15 2040 12/11/15 0701  GLUCAP 176* 170*    Significant Diagnostic Studies:  Nm Gi Blood Loss  12/05/2015  CLINICAL DATA:  Bright red blood per rectum. Patient actively bleeding. Gastrointestinal bleeding K92.2 (ICD-10-CM) Gastrointestinal hemorrhage, unspecified gastritis, unspecified gastrointestinal hemorrhage type K92.2 (ICD-10-CM) EXAM: NUCLEAR MEDICINE GASTROINTESTINAL BLEEDING SCAN TECHNIQUE: Sequential abdominal images were obtained following intravenous administration of Tc-64m labeled red blood cells. RADIOPHARMACEUTICALS:  25.2 mCi Tc-48m in-vitro labeled red cells. COMPARISON:  CT, 11/27/2015 FINDINGS: There is a small elongated focus of uptake in the right central pelvis, adjacent to the right dome of the bladder and right common iliac artery, which becomes apparent approximately 20 minutes following the injection of radiotracer, fading later during the examination. Although somewhat equivocal, this suggests a gastro intestinal bleeding site from the sigmoid colon. There is no other evidence of a GI bleeding source. No other abnormal radiotracer accumulation. IMPRESSION: 1. Small area of abnormal radiotracer accumulation in right central pelvis suggesting a GI bleeding source from the sigmoid colon. Electronically Signed   By: Lajean Manes M.D.   On: 12/05/2015 14:51   Dg Chest Port 1 View  12/04/2015  CLINICAL DATA:  Altered mental status.  GI bleed. EXAM:  PORTABLE CHEST 1 VIEW COMPARISON:  11/27/2015 FINDINGS: Cardiac pacemaker. Postoperative changes in the mediastinum. Shallow inspiration. Heart size and pulmonary vascularity are normal. No focal airspace disease or consolidation in the lungs. No blunting of costophrenic angles. No pneumothorax. Old left rib fractures. Old left clavicular fracture. IMPRESSION: No active disease. Electronically Signed   By: Lucienne Capers M.D.   On: 12/04/2015 23:57    2D ECHO: Study Conclusions  - Left ventricle: The cavity size was normal. Wall thickness was  normal. Systolic  function was moderately to severely reduced. The  estimated ejection fraction was in the range of 30% to 35%. There  is akinesis of the anterior, inferolateral, and apical  myocardium. The study is not technically sufficient to allow  evaluation of LV diastolic function. - Aortic valve: Valve mobility was restricted. There was mild to  moderate stenosis. There was trivial regurgitation. Valve area  (VTI): 1.48 cm^2. Valve area (Vmax): 1.41 cm^2. Valve area  (Vmean): 1.34 cm^2. - Mitral valve: There was mild regurgitation. - Left atrium: The atrium was mildly dilated. - Tricuspid valve: There was moderate regurgitation. - Pulmonary arteries: Systolic pressure was moderately to severely  increased. PA peak pressure: 64 mm Hg (S  Disposition and Follow-up:    DISPOSITION: Inpatient rehabilitation  DISCHARGE FOLLOW-UP Follow-up Information    Follow up with Donnajean Lopes, MD. Schedule an appointment as soon as possible for a visit in 2 weeks.   Specialty:  Internal Medicine   Why:  for hospital follow-up   Contact information:   [place] [address] [phone]       Follow up with Cristopher Peru, MD. Schedule an appointment as soon as possible for a visit in 2 weeks.   Specialty:  Cardiology   Why:  for hospital follow-up   Contact information:   [address] [place]  [address] [place] [address] [phone]        Time spent on Discharge: 35 minutes  Signed:   Levita Monical M.D. [facility] 12/11/2015, 10:22 AM Pager: [phone]  Coding query  Severe protein calorie malnutrition in context of acute and chronic illness. BMI 16   Denys Salinger M.D. [facility] 12/24/2015, 3:45 PM  Pager: [phone]

## 2015-12-11 NOTE — Progress Notes (Signed)
Called per floor RN regarding Patient with acute unresponsiveness. Pt VSS 114/61, HR 90s, RR 17, po2 97-100% in RA, temp 98.6. Pupils size 2 equal and responsive, blinks to threat will not track. Muscle tone present in all four extremities but will not withdraw to pain stimuli. Dr. Letta Pate paged and updated per floor RN. Stat labs, CT scan of head, and CXR ordered. Stat [facility] consult in process. RN to carry out orders and update my self and Provider if worsening. RRT to follow closley tonight.

## 2015-12-11 NOTE — Progress Notes (Signed)
Roberto Blake, MD Physician Signed Physical Medicine and Rehabilitation Consult Note 12/09/2015 6:30 AM  Related encounter: ED to Hosp-Admission (Current) from 12/04/2015 in [facility]    Expand All Collapse All        Physical Medicine and Rehabilitation Consult Reason for Consult: Debilitation/GI bleed Referring Physician: [facility]   HPI: [name] is a 82 y.o. right handed male with history of atrial fibrillation maintained on chronic Coumadin, coronary artery disease status post CABG, systolic and diastolic congestive heart failure, type 2 diabetes mellitus, bladder cancer. Lives with wife in [place]. Reports to be independent prior to admission with a general decline over the past few months. Presented 12/05/2015 with bright red blood from the rectum as well as a recent fall from the toilet approximately 1 week ago. Patient reports some recent constipation 2 weeks ago but denied any nausea vomiting or abdominal pain. Patient with ongoing active bleeding in the ED. Hemoglobin 6.5. INR 4.86. EKG showed new ST changes and T-wave inversions in the anterior leads. Troponin 0.20. Patient did receive vitamin K fresh frozen plasma 2 units and 1 unit of packed red blood cells. Gastroenterology services consulted Dr. Carlean Purl with findings of a single solitary ulcer in the rectum and in the distal rectum. Underwent epi injection and then cauterization. Close monitoring of hemoglobin status post PRBCs 8 with latest hemoglobin 8.3. Patient's chronic Coumadin currently remains on hold. He has been placed on subcutaneous heparin for DVT prophylaxis. Follow-up cardiology services with echocardiogram showing ejection fraction of 35%. Akinesis of the anterior inferior lateral and apical myocardium. Placed on intravenous Lasix for acute on chronic systolic and diastolic heart failure. Close monitoring of creatinine 1.89-2.43. Tolerating a regular diet. Urine culture  Escherichia coli presently maintained on Rocephin. Physical therapy evaluation completed 12/08/2015 for debilitation with recommendations of physical medicine rehabilitation consult.  Patient fell prior to admission and hit the corner of his left eye on the sink Patient also states that he has had lower extremity swelling as well as right greater than left upper extremity swelling even at home prior to admission.  Review of Systems  Constitutional: Negative for fever and chills.  HENT: Negative for hearing loss.  Eyes: Negative for blurred vision and double vision.  Respiratory: Positive for shortness of breath. Negative for cough.  Cardiovascular: Positive for palpitations and leg swelling. Negative for chest pain.  Gastrointestinal: Positive for constipation. Negative for nausea and vomiting.  Genitourinary: Negative for dysuria and hematuria.  Musculoskeletal: Positive for myalgias, joint pain and falls. Negative for back pain.  Skin: Negative for rash.  Neurological: Positive for weakness. Negative for seizures and headaches.  All other systems reviewed and are negative.  Past Medical History  Diagnosis Date  . Atrial fibrillation (HCC)     Chronic  . Coronary artery disease 2004    Status post CABG with LIMA-LAD, SVG-CFX. Hx of anterior apical infarct.  . Renal insufficiency     Chronic  . Hypertension   . Hyperlipidemia   . Gout   . CHF (congestive heart failure) (HCC)     with chronic ischemic cardiomypathy. EF of 10-15%. CHF due to systolic dysfunction. Clinically doing well.  [name] GERD (gastroesophageal reflux disease)   . Pacemaker     Medtronic biventricular pacemaker, serial number [id number]  . Type II diabetes mellitus ([place])     "been off my RX for ~ 2 yr" (03/20/2013)  . H/O hiatal hernia   . Arthritis     "  right thumb" (03/20/2013)  . Depression   . Anxiety   . Bladder cancer ([place])     "low  grade; grade 1; non-invasive" (03/20/2013)  . CKD (chronic kidney disease), stage III    Past Surgical History  Procedure Laterality Date  . Transurethral resection of prostate  1990's; 2004    "twice; injected chemo 1st time; didn't do that 2nd time" (03/20/2013)  . Nasal septum surgery    . US echocardiography  04-17-2009    Est EF 10-15%  . Cardiovascular stress test  04-17-2009    EF 29%  . Coronary artery bypass graft  02/19/2003    LIMA-LAD, SVG-CFX  . Carotid endarterectomy Right 1990's    "left side is still 100% blocked" (03/20/2013)  . Icd lead removal Left 03/01/2013    Procedure: ICD LEAD REMOVAL; Surgeon: Evans Lance, MD; Location: [facility]; Service: Cardiovascular; Laterality: Left;  . Pacemaker lead removal Left 03/01/2013    Procedure: PACEMAKER LEAD REMOVAL; Surgeon: Evans Lance, MD; Location: [facility]; Service: Cardiovascular; Laterality: Left;  . Pacemaker insertion N/A 03/01/2013    Procedure: INSERTION PACEMAKER LEAD; Surgeon: Evans Lance, MD; Location: [facility]; Service: Cardiovascular; Laterality: N/A;  . Hemorrhoid surgery      "lanced several years ago" (03/20/2013)  . Tonsillectomy and adenoidectomy  1933  . Cataract extraction w/ intraocular lens implant, bilateral Bilateral ~ 2000  . Bi-ventricular pacemaker insertion N/A 03/20/2013    Procedure: BI-VENTRICULAR PACEMAKER INSERTION (CRT-P); Surgeon: Evans Lance, MD; Medtronic biventricular pacemaker, serial number [id number]; Laterality: Right   Family History  Problem Relation Age of Onset  . Heart attack Mother    Social History:  reports that he quit smoking about 55 years ago. His smoking use included Cigarettes. He has a 17 pack-year smoking history. He has never used smokeless tobacco. He reports that he drinks alcohol. He reports that he does not use illicit drugs. Allergies:  Allergies  Allergen  Reactions  . Penicillins Hives and Itching    Has patient had a PCN reaction causing immediate rash, facial/tongue/throat swelling, SOB or lightheadedness with hypotension: YES Has patient had a PCN reaction causing severe rash involving mucus membranes or skin necrosis: NO Has patient had a PCN reaction that required hospitalizationNO Has patient had a PCN reaction occurring within the last 10 years: NO If all of the above answers are "NO", then may proceed with Cephalosporin use.   Medications Prior to Admission  Medication Sig Dispense Refill  . allopurinol (ZYLOPRIM) 300 MG tablet Take 300 mg by mouth daily.     [name] ALPRAZolam (XANAX) 0.5 MG tablet Take 1 mg by mouth at bedtime.     . calcium-vitamin D (OSCAL WITH D) 500-200 MG-UNIT per tablet Take 1 tablet by mouth daily.     . digoxin (LANOXIN) 0.125 MG tablet Take 0.0625 mg by mouth daily.     . furosemide (LASIX) 40 MG tablet Take 80 mg by mouth daily.    [name] HYDROcodone-acetaminophen (NORCO/VICODIN) 5-325 MG per tablet Take 0.5 tablets by mouth at bedtime.     [name] ipratropium (ATROVENT) 0.03 % nasal spray Place 1-2 sprays into both nostrils 3 (three) times daily as needed for rhinitis (congestion).   4  . lovastatin (MEVACOR) 20 MG tablet Take 20 mg by mouth at bedtime. Reported on 11/24/2015    . megestrol (MEGACE) 40 MG/ML suspension Take 400 mg by mouth 2 (two) times daily. 10 mls - every morning and at 3pm    .  Melatonin 10 MG TABS Take 10 mg by mouth at bedtime.    . multivitamin-lutein (OCUVITE-LUTEIN) CAPS Take 1 capsule by mouth daily.    . Naphazoline HCl (CLEAR EYES OP) Place 1 drop into both eyes daily as needed (irritation/ eye drop).    [name] spironolactone (ALDACTONE) 25 MG tablet Take 25 mg by mouth daily.    . Triamcinolone Acetonide (NASACORT AQ NA) Place 1 spray into both nostrils daily.    . vitamin B-12 (CYANOCOBALAMIN) 1000 MCG tablet Take  1,000 mcg by mouth daily.    [name] warfarin (COUMADIN) 2 MG tablet Take 1-2 mg by mouth at bedtime. Take 1/2 tablet (1 mg) by mouth Monday, Wednesday, Friday, take 1 tablet (2 mg) on Sunday, Tuesday, Thursday, Saturday      Home: Home Living Family/patient expects to be discharged to:: Private residence Living Arrangements: Spouse/significant other Available Help at Discharge: Family, Available 24 hours/day ("my wife is 25 yrs younger than me") Type of Home: House Home Equipment: None Additional Comments: Pt provides different answers to same question so home setup unknown  Functional History: Prior Function Level of Independence: Independent Comments: per pt Functional Status:  Mobility: Bed Mobility Overal bed mobility: Needs Assistance Bed Mobility: Sidelying to Sit, Sit to Supine Sidelying to sit: Min guard Sit to supine: Supervision General bed mobility comments: minguard due to incr effort and reports of dizziness; after returned to bed, pt felt he needed to have BM and assisted to [facility] Transfers Overall transfer level: Needs assistance Equipment used: None Transfers: Sit to/from Stand, Stand Pivot Transfers Sit to Stand: Min guard Stand pivot transfers: Min assist General transfer comment: pt with difficulty sequencing and aligning himself with [facility] Ambulation/Gait Ambulation/Gait assistance: Min assist Ambulation Distance (Feet): 25 Feet Assistive device: (holding IV pole) Gait Pattern/deviations: Step-through pattern, Decreased stride length General Gait Details: steady Gait velocity interpretation: Below normal speed for age/gender    ADL:    Cognition: Cognition Overall Cognitive Status: Impaired/Different from baseline Orientation Level: Oriented X4 Cognition Arousal/Alertness: Awake/alert Behavior During Therapy: WFL for tasks assessed/performed Overall Cognitive Status: Impaired/Different from baseline Area of Impairment: Memory, Following  commands, Safety/judgement, Problem solving Memory: Decreased short-term memory Following Commands: Follows one step commands with increased time Safety/Judgement: Decreased awareness of safety, Decreased awareness of deficits Problem Solving: Slow processing, Decreased initiation, Requires verbal cues, Requires tactile cues  Blood pressure 128/73, pulse 89, temperature 98.5 F (36.9 C), temperature source Oral, resp. rate 18, height 5\' 8"  (1.727 m), weight 54.522 kg (120 lb 3.2 oz), SpO2 99 %. Physical Exam  Constitutional: He is oriented to person, place, and time.  82 year old frail right-handed male  HENT:  Bruising to the left side of the face.  Eyes: EOM are normal.  Neck: Normal range of motion. Neck supple. No thyromegaly present.  Cardiovascular:  Cardiac rate control  Respiratory: Effort normal and breath sounds normal.  GI: Soft. Bowel sounds are normal. He exhibits no distension.  Neurological: He is alert and oriented to person, place, and time.  Skin: Skin is warm and dry.  Motor strength is 4 minus in the right deltoid, biceps, triceps, grip 4/5 in the left deltoid, biceps, triceps, grip 4+ in bilateral hip flexor and knee extensor and ankle dorsiflexor He has no evidence of edema in the lower limbs 2-3+ edema right upper limb with extensive ecchymosis 1+ edema left upper limb with extensive ecchymosis   Lab Results Last 24 Hours    Results for orders placed or performed during the  hospital encounter of 12/04/15 (from the past 24 hour(s))  Basic metabolic panel Status: Abnormal   Collection Time: 12/08/15 9:13 AM  Result Value Ref Range   Sodium 129 (L) 135 - 145 mmol/L   Potassium 5.0 3.5 - 5.1 mmol/L   Chloride 104 101 - 111 mmol/L   CO2 19 (L) 22 - 32 mmol/L   Glucose, Bld 98 65 - 99 mg/dL   BUN 54 (H) 6 - 20 mg/dL   Creatinine, Ser 1.36 (H) 0.61 - 1.24 mg/dL   Calcium 7.7 (L) 8.9 - 10.3 mg/dL   GFR calc non Af  Amer 45 (L) >60 mL/min   GFR calc Af Amer 52 (L) >60 mL/min   Anion gap 6 5 - 15  Hemoglobin A1c Status: Abnormal   Collection Time: 12/08/15 9:17 AM  Result Value Ref Range   Hgb A1c MFr Bld 5.9 (H) 4.8 - 5.6 %   Mean Plasma Glucose 123 mg/dL  Glucose, capillary Status: Abnormal   Collection Time: 12/08/15 4:30 PM  Result Value Ref Range   Glucose-Capillary 151 (H) 65 - 99 mg/dL  Protime-INR Status: Abnormal   Collection Time: 12/09/15 4:53 AM  Result Value Ref Range   Prothrombin Time 19.5 (H) 11.6 - 15.2 seconds   INR 1.65 (H) 0.00 - 1.49  CBC Status: Abnormal   Collection Time: 12/09/15 4:53 AM  Result Value Ref Range   WBC 19.9 (H) 4.0 - 10.5 K/uL   RBC 2.73 (L) 4.22 - 5.81 MIL/uL   Hemoglobin 8.3 (L) 13.0 - 17.0 g/dL   HCT 24.1 (L) 39.0 - 52.0 %   MCV 88.3 78.0 - 100.0 fL   MCH 30.4 26.0 - 34.0 pg   MCHC 34.4 30.0 - 36.0 g/dL   RDW 15.1 11.5 - 15.5 %   Platelets 130 (L) 150 - 400 K/uL  Basic metabolic panel Status: Abnormal   Collection Time: 12/09/15 4:53 AM  Result Value Ref Range   Sodium 128 (L) 135 - 145 mmol/L   Potassium 5.0 3.5 - 5.1 mmol/L   Chloride 100 (L) 101 - 111 mmol/L   CO2 18 (L) 22 - 32 mmol/L   Glucose, Bld 153 (H) 65 - 99 mg/dL   BUN 60 (H) 6 - 20 mg/dL   Creatinine, Ser 1.74 (H) 0.61 - 1.24 mg/dL   Calcium 7.7 (L) 8.9 - 10.3 mg/dL   GFR calc non Af Amer 33 (L) >60 mL/min   GFR calc Af Amer 39 (L) >60 mL/min   Anion gap 10 5 - 15  Glucose, capillary Status: Abnormal   Collection Time: 12/09/15 5:29 AM  Result Value Ref Range   Glucose-Capillary 156 (H) 65 - 99 mg/dL   Comment 1 Notify RN    Comment 2 Document in Chart       Imaging Results (Last 48 hours)    Dg Chest Port 1 View  12/08/2015 CLINICAL DATA: Shortness of breath EXAM: PORTABLE CHEST 1  VIEW COMPARISON: 12/06/2015 FINDINGS: Chronic cardiomegaly. Biventricular pacer from the right with leads in stable position. Status post CABG. New hazy appearance at the right base with costophrenic sulcus blunting. No pneumothorax. Remote mid left clavicle fracture. IMPRESSION: New small right pleural effusion. Electronically Signed By: Monte Fantasia M.D. On: 12/08/2015 06:31     Assessment/Plan: Diagnosis: Deconditioning after GI bleed and fall 1. Does the need for close, 24 hr/day medical supervision in concert with the patient's rehab needs make it unreasonable for this patient to be served in a  less intensive setting? Yes 2. Co-Morbidities requiring supervision/potential complications: Acute on chronic systolic and diastolic congestive heart failure, coronary artery disease, type 2 diabetes, uncontrolled 3. Due to bladder management, bowel management, safety, skin/wound care, disease management, medication administration, pain management and patient education, does the patient require 24 hr/day rehab nursing? Yes 4. Does the patient require coordinated care of a physician, rehab nurse, PT (1-2 hrs/day, 5 days/week) and OT (1-2 hrs/day, 5 days/week) to address physical and functional deficits in the context of the above medical diagnosis(es)? Yes Addressing deficits in the following areas: balance, endurance, locomotion, strength, transferring, bowel/bladder control, bathing, dressing, feeding, grooming, toileting and cognition 5. Can the patient actively participate in an intensive therapy program of at least 3 hrs of therapy per day at least 5 days per week? Yes 6. The potential for patient to make measurable gains while on inpatient rehab is excellent 7. Anticipated functional outcomes upon discharge from inpatient rehab are modified independent with PT, modified independent with OT, n/a with SLP. 8. Estimated rehab length of stay to reach the above functional goals is: 7 days 9. Does  the patient have adequate social supports and living environment to accommodate these discharge functional goals? Yes 10. Anticipated D/C setting: Home 11. Anticipated post D/C treatments: [place] therapy 12. Overall Rehab/Functional Prognosis: excellent  RECOMMENDATIONS: This patient's condition is appropriate for continued rehabilitative care in the following setting: CIR Patient has agreed to participate in recommended program. Yes Note that insurance prior authorization may be required for reimbursement for recommended care.  Comment: Wife is about 14 years younger she is able to help at home    12/09/2015       Revision History     Date/Time User Provider Type Action   12/09/2015 10:23 AM Roberto Blake, MD Physician Sign   12/09/2015 6:53 AM Cathlyn Parsons, PA-C Physician Assistant [place]   View Details Report       Routing History     Date/Time From To [facility]   12/09/2015 10:23 AM Roberto Blake, MD Leanna Battles, MD Fax

## 2015-12-11 NOTE — Care Management Note (Signed)
Case Management Note  Patient Details  Name: [name] MRN: [id number] Date of Birth: March 13, 1925  Subjective/Objective:    Pt admitted GI bleed                Action/Plan:  PT is from home with wife.  Pt has history of falls.  CIR recommendation placed and CSW consulted for backup plan.  Pt is in agreement with both CIR and SNF.  CM will continue to monitor for disposition needs.   Expected Discharge Date:                  Expected Discharge Plan:  IP Rehab Facility  In-House Referral:  Clinical Social Work  Discharge planning Services  CM Consult  Post Acute Care Choice:    Choice offered to:     DME Arranged:    DME Agency:     HH Arranged:    [place] Agency:     Status of Service:  Completed, signed off  Medicare Important Message Given:  Yes Date Medicare IM Given:    Medicare IM give by:    Date Additional Medicare IM Given:    Additional Medicare Important Message give by:     If discussed at [place] of Stay Meetings, dates discussed:    Additional Comments: Discharged to CIR 12/11/15 Maryclare Labrador, RN 12/11/2015, 1:52 PM

## 2015-12-12 ENCOUNTER — Inpatient Hospital Stay (HOSPITAL_COMMUNITY): Payer: Medicare Other | Admitting: Physical Therapy

## 2015-12-12 ENCOUNTER — Inpatient Hospital Stay (HOSPITAL_COMMUNITY): Payer: Medicare Other | Admitting: Occupational Therapy

## 2015-12-12 ENCOUNTER — Inpatient Hospital Stay (HOSPITAL_COMMUNITY): Payer: Medicare Other

## 2015-12-12 ENCOUNTER — Inpatient Hospital Stay (HOSPITAL_COMMUNITY)
Admission: AD | Admit: 2015-12-12 | Discharge: 2015-12-15 | DRG: 071 | Disposition: A | Discharge status: Rehabilitation Facility | Payer: Medicare Other | Source: Other Acute Inpatient Hospital | Attending: Internal Medicine | Admitting: Internal Medicine

## 2015-12-12 DIAGNOSIS — G9340 Encephalopathy, unspecified: Secondary | ICD-10-CM | POA: Diagnosis not present

## 2015-12-12 DIAGNOSIS — Z7901 Long term (current) use of anticoagulants: Secondary | ICD-10-CM

## 2015-12-12 DIAGNOSIS — I5022 Chronic systolic (congestive) heart failure: Secondary | ICD-10-CM

## 2015-12-12 DIAGNOSIS — F419 Anxiety disorder, unspecified: Secondary | ICD-10-CM | POA: Diagnosis present

## 2015-12-12 DIAGNOSIS — I4891 Unspecified atrial fibrillation: Secondary | ICD-10-CM | POA: Diagnosis present

## 2015-12-12 DIAGNOSIS — R4189 Other symptoms and signs involving cognitive functions and awareness: Secondary | ICD-10-CM

## 2015-12-12 DIAGNOSIS — Z950 Presence of cardiac pacemaker: Secondary | ICD-10-CM | POA: Diagnosis not present

## 2015-12-12 DIAGNOSIS — M109 Gout, unspecified: Secondary | ICD-10-CM | POA: Diagnosis present

## 2015-12-12 DIAGNOSIS — N189 Chronic kidney disease, unspecified: Secondary | ICD-10-CM

## 2015-12-12 DIAGNOSIS — I5042 Chronic combined systolic (congestive) and diastolic (congestive) heart failure: Secondary | ICD-10-CM | POA: Diagnosis present

## 2015-12-12 DIAGNOSIS — I2510 Atherosclerotic heart disease of native coronary artery without angina pectoris: Secondary | ICD-10-CM | POA: Diagnosis present

## 2015-12-12 DIAGNOSIS — E1122 Type 2 diabetes mellitus with diabetic chronic kidney disease: Secondary | ICD-10-CM | POA: Diagnosis present

## 2015-12-12 DIAGNOSIS — D631 Anemia in chronic kidney disease: Secondary | ICD-10-CM | POA: Diagnosis present

## 2015-12-12 DIAGNOSIS — I255 Ischemic cardiomyopathy: Secondary | ICD-10-CM | POA: Diagnosis present

## 2015-12-12 DIAGNOSIS — B9620 Unspecified Escherichia coli [E. coli] as the cause of diseases classified elsewhere: Secondary | ICD-10-CM | POA: Diagnosis present

## 2015-12-12 DIAGNOSIS — N183 Chronic kidney disease, stage 3 unspecified: Secondary | ICD-10-CM | POA: Diagnosis present

## 2015-12-12 DIAGNOSIS — Z79899 Other long term (current) drug therapy: Secondary | ICD-10-CM | POA: Diagnosis not present

## 2015-12-12 DIAGNOSIS — R569 Unspecified convulsions: Secondary | ICD-10-CM | POA: Diagnosis present

## 2015-12-12 DIAGNOSIS — R4182 Altered mental status, unspecified: Secondary | ICD-10-CM | POA: Diagnosis present

## 2015-12-12 DIAGNOSIS — Z8551 Personal history of malignant neoplasm of bladder: Secondary | ICD-10-CM | POA: Diagnosis not present

## 2015-12-12 DIAGNOSIS — F329 Major depressive disorder, single episode, unspecified: Secondary | ICD-10-CM | POA: Diagnosis present

## 2015-12-12 DIAGNOSIS — Z87891 Personal history of nicotine dependence: Secondary | ICD-10-CM

## 2015-12-12 DIAGNOSIS — E785 Hyperlipidemia, unspecified: Secondary | ICD-10-CM | POA: Diagnosis present

## 2015-12-12 DIAGNOSIS — I130 Hypertensive heart and chronic kidney disease with heart failure and stage 1 through stage 4 chronic kidney disease, or unspecified chronic kidney disease: Secondary | ICD-10-CM | POA: Diagnosis present

## 2015-12-12 DIAGNOSIS — Z781 Physical restraint status: Secondary | ICD-10-CM

## 2015-12-12 DIAGNOSIS — K219 Gastro-esophageal reflux disease without esophagitis: Secondary | ICD-10-CM | POA: Diagnosis present

## 2015-12-12 DIAGNOSIS — Z961 Presence of intraocular lens: Secondary | ICD-10-CM | POA: Diagnosis present

## 2015-12-12 DIAGNOSIS — Z681 Body mass index (BMI) 19 or less, adult: Secondary | ICD-10-CM | POA: Diagnosis not present

## 2015-12-12 DIAGNOSIS — Z86718 Personal history of other venous thrombosis and embolism: Secondary | ICD-10-CM

## 2015-12-12 DIAGNOSIS — Z880 Allergy status to penicillin: Secondary | ICD-10-CM | POA: Diagnosis not present

## 2015-12-12 DIAGNOSIS — G4700 Insomnia, unspecified: Secondary | ICD-10-CM | POA: Diagnosis present

## 2015-12-12 DIAGNOSIS — I482 Chronic atrial fibrillation: Secondary | ICD-10-CM | POA: Diagnosis present

## 2015-12-12 DIAGNOSIS — Z9841 Cataract extraction status, right eye: Secondary | ICD-10-CM | POA: Diagnosis not present

## 2015-12-12 DIAGNOSIS — R451 Restlessness and agitation: Secondary | ICD-10-CM | POA: Diagnosis not present

## 2015-12-12 DIAGNOSIS — R64 Cachexia: Secondary | ICD-10-CM | POA: Diagnosis present

## 2015-12-12 DIAGNOSIS — Z951 Presence of aortocoronary bypass graft: Secondary | ICD-10-CM | POA: Diagnosis not present

## 2015-12-12 DIAGNOSIS — D649 Anemia, unspecified: Secondary | ICD-10-CM

## 2015-12-12 DIAGNOSIS — N3000 Acute cystitis without hematuria: Secondary | ICD-10-CM | POA: Diagnosis present

## 2015-12-12 DIAGNOSIS — I639 Cerebral infarction, unspecified: Secondary | ICD-10-CM

## 2015-12-12 DIAGNOSIS — R404 Transient alteration of awareness: Secondary | ICD-10-CM

## 2015-12-12 DIAGNOSIS — I5043 Acute on chronic combined systolic (congestive) and diastolic (congestive) heart failure: Secondary | ICD-10-CM | POA: Diagnosis present

## 2015-12-12 DIAGNOSIS — G9341 Metabolic encephalopathy: Principal | ICD-10-CM | POA: Diagnosis present

## 2015-12-12 DIAGNOSIS — R5381 Other malaise: Secondary | ICD-10-CM | POA: Diagnosis present

## 2015-12-12 DIAGNOSIS — Z9842 Cataract extraction status, left eye: Secondary | ICD-10-CM | POA: Diagnosis not present

## 2015-12-12 LAB — GLUCOSE, CAPILLARY
GLUCOSE-CAPILLARY: 125 mg/dL — AB (ref 65–99)
GLUCOSE-CAPILLARY: 95 mg/dL (ref 65–99)
GLUCOSE-CAPILLARY: 132 mg/dL — AB (ref 65–99)
Glucose-Capillary: 147 mg/dL — ABNORMAL HIGH (ref 65–99)
Glucose-Capillary: 100 mg/dL — ABNORMAL HIGH (ref 65–99)
Glucose-Capillary: 114 mg/dL — ABNORMAL HIGH (ref 65–99)

## 2015-12-12 LAB — BLOOD GAS, ARTERIAL
Acid-Base Excess: 0.9 mmol/L (ref 0.0–2.0)
BICARBONATE: 23.8 meq/L (ref 20.0–24.0)
Drawn by: 426241
FIO2: 0.21
O2 Saturation: 96.9 %
PCO2 ART: 30.4 mmHg — AB (ref 35.0–45.0)
PH ART: 7.505 — AB (ref 7.350–7.450)
Patient temperature: 98.6
TCO2: 24.7 mmol/L (ref 0–100)
pO2, Arterial: 81.6 mmHg (ref 80.0–100.0)

## 2015-12-12 LAB — TROPONIN I: TROPONIN I: 0.19 ng/mL — AB (ref <0.031)

## 2015-12-12 LAB — LACTIC ACID, PLASMA
Lactic Acid, Venous: 1.8 mmol/L (ref 0.5–2.0)
Lactic Acid, Venous: 1.4 mmol/L (ref 0.5–2.0)
Lactic Acid, Venous: 1.2 mmol/L (ref 0.5–2.0)

## 2015-12-12 LAB — PROCALCITONIN
PROCALCITONIN: 0.19 ng/mL
Procalcitonin: 0.19 ng/mL

## 2015-12-12 LAB — AMMONIA: Ammonia: 23 umol/L (ref 9–35)

## 2015-12-12 LAB — VITAMIN B12: Vitamin B-12: 1210 pg/mL — ABNORMAL HIGH (ref 180–914)

## 2015-12-12 MED ORDER — ALPRAZOLAM 0.5 MG PO TABS: 1.0000 mg | ORAL_TABLET | Freq: Every day | ORAL | Status: DC

## 2015-12-12 MED ORDER — CETYLPYRIDINIUM CHLORIDE 0.05 % MT LIQD
7.0000 mL | Freq: Two times a day (BID) | OROMUCOSAL | Status: DC
  Administered 2015-12-12 – 2015-12-15 (×7): 7 mL via OROMUCOSAL

## 2015-12-12 MED ORDER — LEVETIRACETAM 500 MG/5ML IV SOLN
500.0000 mg | Freq: Two times a day (BID) | INTRAVENOUS | Status: DC
  Administered 2015-12-12: 500 mg via INTRAVENOUS
  Filled 2015-12-12 (×3): qty 5

## 2015-12-12 MED ORDER — SODIUM CHLORIDE 0.9 % IV SOLN
1000.0000 mg | Freq: Once | INTRAVENOUS | Status: AC
  Administered 2015-12-12: 1000 mg via INTRAVENOUS
  Filled 2015-12-12: qty 10

## 2015-12-12 MED ORDER — PRAVASTATIN SODIUM 20 MG PO TABS: 20.0000 mg | ORAL_TABLET | Freq: Every day | ORAL | Status: DC

## 2015-12-12 MED ORDER — ALLOPURINOL 300 MG PO TABS: 300.0000 mg | ORAL_TABLET | Freq: Every day | ORAL | Status: DC

## 2015-12-12 MED ORDER — DIGOXIN 125 MCG PO TABS: 0.0625 mg | ORAL_TABLET | Freq: Every day | ORAL | Status: DC

## 2015-12-12 MED ORDER — SODIUM CHLORIDE 0.9 % IV SOLN: INTRAVENOUS | Status: DC

## 2015-12-12 MED ORDER — ACETAMINOPHEN 325 MG PO TABS: 650.0000 mg | ORAL_TABLET | Freq: Four times a day (QID) | ORAL | Status: DC | PRN

## 2015-12-12 MED ORDER — SENNOSIDES-DOCUSATE SODIUM 8.6-50 MG PO TABS: 1.0000 | ORAL_TABLET | Freq: Every evening | ORAL | Status: DC | PRN

## 2015-12-12 MED ORDER — HALOPERIDOL LACTATE 5 MG/ML IJ SOLN
2.0000 mg | Freq: Four times a day (QID) | INTRAMUSCULAR | Status: DC | PRN
  Administered 2015-12-13: 2 mg via INTRAVENOUS
  Filled 2015-12-12: qty 1

## 2015-12-12 MED ORDER — HEPARIN SODIUM (PORCINE) 5000 UNIT/ML IJ SOLN: 5000.0000 [IU] | Freq: Three times a day (TID) | INTRAMUSCULAR | Status: DC

## 2015-12-12 MED ORDER — CALCIUM CARBONATE-VITAMIN D 500-200 MG-UNIT PO TABS: 1.0000 | ORAL_TABLET | Freq: Every day | ORAL | Status: DC

## 2015-12-12 MED ORDER — DEXTROSE 5 % IV SOLN
1.0000 g | INTRAVENOUS | Status: DC
  Administered 2015-12-12 – 2015-12-15 (×4): 1 g via INTRAVENOUS
  Filled 2015-12-12 (×4): qty 10

## 2015-12-12 MED ORDER — ASPIRIN EC 81 MG PO TBEC: 81.0000 mg | DELAYED_RELEASE_TABLET | Freq: Every day | ORAL | Status: DC

## 2015-12-12 MED ORDER — SODIUM CHLORIDE 0.9% FLUSH: 3.0000 mL | Freq: Two times a day (BID) | INTRAVENOUS | Status: DC

## 2015-12-12 MED ORDER — FUROSEMIDE 80 MG PO TABS: 80.0000 mg | ORAL_TABLET | Freq: Every day | ORAL | Status: DC

## 2015-12-12 MED ORDER — LORAZEPAM 2 MG/ML IJ SOLN
1.0000 mg | INTRAMUSCULAR | Status: DC | PRN
  Administered 2015-12-13: 1 mg via INTRAVENOUS
  Filled 2015-12-12: qty 1

## 2015-12-12 MED ORDER — VITAMIN B-12 1000 MCG PO TABS: 1000.0000 ug | ORAL_TABLET | Freq: Every day | ORAL | Status: DC

## 2015-12-12 MED ORDER — IPRATROPIUM BROMIDE 0.06 % NA SOLN: 1.0000 | Freq: Three times a day (TID) | NASAL | Status: DC | PRN

## 2015-12-12 MED ORDER — MEGESTROL ACETATE 40 MG/ML PO SUSP
400.0000 mg | Freq: Two times a day (BID) | ORAL | Status: DC
  Filled 2015-12-12: qty 10

## 2015-12-12 MED ORDER — INSULIN ASPART 100 UNIT/ML ~~LOC~~ SOLN
0.0000 [IU] | SUBCUTANEOUS | Status: DC
  Administered 2015-12-12 – 2015-12-13 (×3): 1 [IU] via SUBCUTANEOUS
  Administered 2015-12-13: 2 [IU] via SUBCUTANEOUS
  Administered 2015-12-14: 1 [IU] via SUBCUTANEOUS
  Administered 2015-12-14 (×3): 2 [IU] via SUBCUTANEOUS
  Administered 2015-12-15: 3 [IU] via SUBCUTANEOUS

## 2015-12-12 MED ORDER — HYDROCODONE-ACETAMINOPHEN 5-325 MG PO TABS: 1.0000 | ORAL_TABLET | ORAL | Status: DC | PRN

## 2015-12-12 MED ORDER — PROSIGHT PO TABS
1.0000 | ORAL_TABLET | Freq: Every day | ORAL | Status: DC
  Filled 2015-12-12: qty 1

## 2015-12-12 MED ORDER — ACETAMINOPHEN 650 MG RE SUPP: 650.0000 mg | Freq: Four times a day (QID) | RECTAL | Status: DC | PRN

## 2015-12-12 MED ORDER — HYDROCODONE-ACETAMINOPHEN 5-325 MG PO TABS: 0.5000 | ORAL_TABLET | Freq: Every day | ORAL | Status: DC

## 2015-12-12 NOTE — Procedures (Signed)
History: 82 year old male with  Sedation: None  Technique: This is a 21 channel routine scalp EEG performed at the bedside with bipolar and monopolar montages arranged in accordance to the international 10/20 system of electrode placement. One channel was dedicated to EKG recording.    Background: The background consists of intermixed alpha and beta activities. There is a well defined posterior dominant rhythm of 9 Hz that attenuates with eye opening. There is a mild increase in delta associated with drowsiness. Sleep is not recorded.  Photic stimulation: Physiologic driving is now performed  EEG Abnormalities: None  Clinical Interpretation: This normal EEG is recorded in the waking and drowsy state. There was no seizure or seizure predisposition recorded on this study. Please note that a normal EEG does not preclude the possibility of epilepsy.   Roland Rack, MD [facility] [phone]  If 7pm- 7am, please page neurology on call as listed in [place].

## 2015-12-12 NOTE — H&P (Signed)
History and Physical    [name] [id number] DOB: [birth year] DOA: 12/12/2015  Referring Provider: Dr. Letta Pate (PM&R) PCP: Donnajean Lopes, MD  Outpatient Specialists: Dr. Lovena Le (cardiology)   Patient coming from: Acute Rehab  Chief Complaint: Altered mental status  HPI: [name] is a medically-complex 82 y.o. male with medical history significant for atrial fibrillation on Coumadin, CAD status post CABG, chronic kidney disease stage III, chronic systolic CHF, depression, and anxiety who was admitted to acute rehabilitation on 12/09/2015 and became unresponsive the night of 12/11/2015. Patient initially presented to the hospital on 12/05/2015 and was admitted to the ICU for acute lower GI bleed with hemorrhagic shock. He underwent tagged RBC study and sigmoidoscopy with identification of a bleeding rectal ulcer. Patient was treated with epi injection, cautery, and a total of 8 units pRBCs. He subsequently developed acute CHF and was eventually stable for discharge to acute rehabilitation on 12/09/2015. He was also diagnosed with UTI during the admission and has been continued on IV Rocephin.   He had reportedly been exhibiting waxing and waning confusion since admission to acute rehab, but earlier tonight became unresponsive. At approximately 9 PM, patient was conversing with nursing staff, but at approximately 9:30 PM, he could not be aroused. He hadn't made any complaints today per se, but had remarked to his RN tonight that he did not understand how he was talking to her, explaining that he was dead. The patient's M.D. and rapid response were notified of his acute change in status. Stat labs were ordered and he was sent for head CT.   CT appears stable relative to the prior study. Blood work reveals an increase in WBC, stable hemoglobin, and stable renal function. Patient remains afebrile, saturating well on room air, and his vital signs are stable. He will be admitted to the  telemetry unit where he will undergo further evaluation and management of his acutely depressed level of consciousness. Differential includes acute CVA, non-convulsive seizure, and catatonia.   Review of Systems:  Unable to obtain given the clinical scenario.    Past Medical History  Diagnosis Date  . Atrial fibrillation (HCC)     Chronic  . Coronary artery disease 2004    Status post CABG with LIMA-LAD, SVG-CFX. Hx of anterior apical infarct.  . Renal insufficiency     Chronic  . Hypertension   . Hyperlipidemia   . Gout   . CHF (congestive heart failure) (HCC)     with chronic ischemic cardiomypathy. EF of 10-15%.  CHF due to systolic dysfunction. Clinically doing well.  [name] GERD (gastroesophageal reflux disease)   . Pacemaker     Medtronic biventricular pacemaker, serial number [id number]  . Type II diabetes mellitus ([place])     "been off my RX for ~ 2 yr" (03/20/2013)  . H/O hiatal hernia   . Arthritis     "right thumb" (03/20/2013)  . Depression   . Anxiety   . Bladder cancer ([place])     "low grade; grade 1; non-invasive" (03/20/2013)  . CKD (chronic kidney disease), stage III     Past Surgical History  Procedure Laterality Date  . Transurethral resection of prostate  1990's; 2004    "twice; injected chemo 1st time; didn't do that 2nd time" (03/20/2013)  . Nasal septum surgery    . US echocardiography  04-17-2009    Est EF 10-15%  . Cardiovascular stress test  04-17-2009    EF 29%  . Coronary artery bypass graft  02/19/2003    LIMA-LAD, SVG-CFX  . Carotid endarterectomy Right 1990's    "left side is still  100% blocked" (03/20/2013)  . Icd lead removal Left 03/01/2013    Procedure: ICD LEAD REMOVAL;  Surgeon: Evans Lance, MD;  Location: [facility];  Service: Cardiovascular;  Laterality: Left;  . Pacemaker lead removal Left 03/01/2013    Procedure: PACEMAKER LEAD REMOVAL;  Surgeon: Evans Lance, MD;  Location: [facility];  Service: Cardiovascular;  Laterality: Left;  . Pacemaker insertion  N/A 03/01/2013    Procedure: INSERTION PACEMAKER LEAD;  Surgeon: Evans Lance, MD;  Location: [place] [facility];  Service: Cardiovascular;  Laterality: N/A;  . Hemorrhoid surgery      "lanced several years ago" (03/20/2013)  . Tonsillectomy and adenoidectomy  1933  . Cataract extraction w/ intraocular lens  implant, bilateral Bilateral ~ 2000  . Bi-ventricular pacemaker insertion N/A 03/20/2013    Procedure: BI-VENTRICULAR PACEMAKER INSERTION (CRT-P);  Surgeon: Evans Lance, MD; Medtronic biventricular pacemaker, serial number [id number]; Laterality: Right  . Flexible sigmoidoscopy N/A 12/05/2015    Procedure: FLEXIBLE SIGMOIDOSCOPY;  Surgeon: [name], MD;  Location: [facility];  Service: Endoscopy;  Laterality: N/A;     reports that he quit smoking about 55 years ago. His smoking use included Cigarettes. He has a 17 pack-year smoking history. He has never used smokeless tobacco. He reports that he drinks alcohol. He reports that he does not use illicit drugs.  Allergies  Allergen Reactions  . Penicillins Hives and Itching    Has patient had a PCN reaction causing immediate rash, facial/tongue/throat swelling, SOB or lightheadedness with hypotension: YES Has patient had a PCN reaction causing severe rash involving mucus membranes or skin necrosis: NO Has patient had a PCN reaction that required hospitalizationNO Has patient had a PCN reaction occurring within the last 10 years: NO If all of the above answers are "NO", then may proceed with Cephalosporin use.    Family History  Problem Relation Age of Onset  . Heart attack Mother      Prior to Admission medications   Medication Sig Start Date End Date Taking? Authorizing Provider  allopurinol (ZYLOPRIM) 300 MG tablet Take 300 mg by mouth daily.      Historical Provider, MD  ALPRAZolam Duanne Moron) 0.5 MG tablet Take 1 mg by mouth at bedtime.  04/07/11   Historical Provider, MD  calcium-vitamin D (OSCAL WITH D) 500-200 MG-UNIT per tablet Take 1  tablet by mouth daily.     Historical Provider, MD  digoxin (LANOXIN) 0.125 MG tablet Take 0.0625 mg by mouth daily.     Historical Provider, MD  furosemide (LASIX) 40 MG tablet Take 80 mg by mouth daily. 02/09/15   Historical Provider, MD  HYDROcodone-acetaminophen (NORCO/VICODIN) 5-325 MG per tablet Take 0.5 tablets by mouth at bedtime.  10/08/12   Historical Provider, MD  ipratropium (ATROVENT) 0.03 % nasal spray Place 1-2 sprays into both nostrils 3 (three) times daily as needed for rhinitis (congestion).  11/19/15   Historical Provider, MD  lovastatin (MEVACOR) 20 MG tablet Take 20 mg by mouth at bedtime. Reported on 11/24/2015    Historical Provider, MD  megestrol (MEGACE) 40 MG/ML suspension Take 400 mg by mouth 2 (two) times daily. 10 mls - every morning and at 3pm    Historical Provider, MD  Melatonin 10 MG TABS Take 10 mg by mouth at bedtime.    Historical Provider, MD  multivitamin-lutein [facility]) CAPS Take 1 capsule by mouth daily.  Historical Provider, MD  Naphazoline HCl (CLEAR EYES OP) Place 1 drop into both eyes daily as needed (irritation/ eye drop).    Historical Provider, MD  Triamcinolone Acetonide (NASACORT AQ NA) Place 1 spray into both nostrils daily.    Historical Provider, MD  vitamin B-12 (CYANOCOBALAMIN) 1000 MCG tablet Take 1,000 mcg by mouth daily.    Historical Provider, MD    Physical Exam: Filed Vitals:   12/12/15 0137  BP: 115/57  Pulse: 93  Temp: 97.6 F (36.4 C)  TempSrc: Oral  Resp: 16  Height: 5\' 10"  (1.778 m)  Weight: 50.122 kg (110 lb 8 oz)  SpO2: 99%      Constitutional: NAD, Cachectic  Eyes: PERTLA, lids and conjunctivae normal; periorbital ecchymosis on left ENMT: Mucous membranes are dry. Posterior pharynx clear of any exudate or lesions.   Neck: normal, supple, no masses, no thyromegaly Respiratory: clear to auscultation bilaterally, no wheezing, no crackles. Normal respiratory effort. No accessory muscle use.  Cardiovascular: S1 &  S2 heard, regular rate and rhythm, no murmurs / rubs / gallops. No extremity edema. No carotid bruits.  Abdomen: No distension, no tenderness, no masses palpated. Bowel sounds normal.  Musculoskeletal: no clubbing / cyanosis. No joint deformity upper and lower extremities.    Skin: no rashes, lesions, ulcers. No induration Neurologic: Pupils equal and reactive, obtunded, not responsive to voice or painful stimuli, corneal reflex present, withdraws from pain intermittently, Babinski down-going, patellar DTRs 2+ and symmetric Psychiatric: Unable to assess given the clinical scenario.     Labs on Admission: I have personally reviewed following labs and imaging studies  CBC:  Recent Labs Lab 12/05/15 1002  12/08/15 0425 12/09/15 0453 12/10/15 0308 12/11/15 0224 12/11/15 2219  WBC 13.6*  < > 21.8* 19.9* 20.2* 16.4* 17.4*  NEUTROABS 11.9*  --   --   --   --   --  14.4*  HGB 6.9*  < > 8.0* 8.3* 8.6* 8.5* 8.8*  HCT 20.7*  < > 23.3* 24.1* 25.7* 25.7* 26.5*  MCV 93.2  < > 86.0 88.3 89.2 90.5 92.7  PLT 81*  < > 106* 130* 192 199 214  < > = values in this interval not displayed. Basic Metabolic Panel:  Recent Labs Lab 12/05/15 1002  12/06/15 0518  12/08/15 0913 12/09/15 0453 12/10/15 0308 12/11/15 0224 12/11/15 2219  NA 136  < > 130*  < > 129* 128* 133* 135 133*  K 4.7  < > 5.3*  < > 5.0 5.0 4.7 4.3 4.5  CL 107  < > 105  < > 104 100* 101 100* 99*  CO2 15*  < > 17*  < > 19* 18* 21* 23 24  GLUCOSE 203*  < > 94  < > 98 153* 203* 160* 115*  BUN 83*  < > 74*  < > 54* 60* 64* 57* 61*  CREATININE 2.11*  < > 1.79*  < > 1.36* 1.74* 1.53* 1.46* 1.50*  CALCIUM 7.1*  < > 6.5*  < > 7.7* 7.7* 7.8* 8.0* 7.8*  MG 1.7  --  1.4*  --   --   --   --   --   --   PHOS 3.8  --  4.0  --   --   --   --   --   --   < > = values in this interval not displayed. GFR: Estimated Creatinine Clearance: 24.1 mL/min (by C-G formula based on Cr of 1.5). Liver Function Tests:  No results for input(s): AST, ALT,  ALKPHOS, BILITOT, PROT, ALBUMIN in the last 168 hours. No results for input(s): LIPASE, AMYLASE in the last 168 hours. No results for input(s): AMMONIA in the last 168 hours. Coagulation Profile:  Recent Labs Lab 12/07/15 0529 12/08/15 0425 12/09/15 0453 12/10/15 0308 12/11/15 0224  INR 1.79* 1.86* 1.65* 1.66* 1.47   Cardiac Enzymes:  Recent Labs Lab 12/05/15 1002 12/05/15 1520 12/05/15 1843 12/11/15 2219  TROPONINI 0.10* 0.13* 0.20* 0.19*   BNP (last 3 results) No results for input(s): PROBNP in the last 8760 hours. HbA1C: No results for input(s): HGBA1C in the last 72 hours. CBG:  Recent Labs Lab 12/11/15 0701 12/11/15 1203 12/11/15 1702 12/11/15 2104 12/11/15 2150  GLUCAP 170* 165* 254* 108* 107*   Lipid Profile: No results for input(s): CHOL, HDL, LDLCALC, TRIG, CHOLHDL, LDLDIRECT in the last 72 hours. Thyroid Function Tests: No results for input(s): TSH, T4TOTAL, FREET4, T3FREE, THYROIDAB in the last 72 hours. Anemia Panel: No results for input(s): VITAMINB12, FOLATE, FERRITIN, TIBC, IRON, RETICCTPCT in the last 72 hours. Urine analysis:    Component Value Date/Time   COLORURINE YELLOW 12/05/2015 0055   APPEARANCEUR CLOUDY* 12/05/2015 0055   LABSPEC 1.012 12/05/2015 0055   PHURINE 6.0 12/05/2015 0055   GLUCOSEU NEGATIVE 12/05/2015 0055   HGBUR MODERATE* 12/05/2015 0055   BILIRUBINUR NEGATIVE 12/05/2015 0055   KETONESUR NEGATIVE 12/05/2015 0055   PROTEINUR NEGATIVE 12/05/2015 0055   UROBILINOGEN 0.2 06/28/2014 1401   NITRITE NEGATIVE 12/05/2015 0055   LEUKOCYTESUR LARGE* 12/05/2015 0055   Sepsis Labs: @LABRCNTIP (procalcitonin:4,lacticidven:4) ) Recent Results (from the past 240 hour(s))  Urine culture     Status: Abnormal   Collection Time: 12/05/15 12:55 AM  Result Value Ref Range Status   Specimen Description URINE, CATHETERIZED  Final   Special Requests NONE  Final   Culture >=100,000 COLONIES/mL ESCHERICHIA COLI (A)  Final   Report Status  12/07/2015 FINAL  Final   Organism ID, Bacteria ESCHERICHIA COLI (A)  Final      Susceptibility   Escherichia coli - MIC*    AMPICILLIN >=32 RESISTANT Resistant     CEFAZOLIN <=4 SENSITIVE Sensitive     CEFTRIAXONE <=1 SENSITIVE Sensitive     CIPROFLOXACIN <=0.25 SENSITIVE Sensitive     GENTAMICIN <=1 SENSITIVE Sensitive     IMIPENEM <=0.25 SENSITIVE Sensitive     NITROFURANTOIN <=16 SENSITIVE Sensitive     TRIMETH/SULFA <=20 SENSITIVE Sensitive     AMPICILLIN/SULBACTAM >=32 RESISTANT Resistant     PIP/TAZO <=4 SENSITIVE Sensitive     * >=100,000 COLONIES/mL ESCHERICHIA COLI  MRSA PCR Screening     Status: None   Collection Time: 12/05/15  5:45 AM  Result Value Ref Range Status   MRSA by PCR NEGATIVE NEGATIVE Final    Comment:        The GeneXpert MRSA Assay (FDA approved for NASAL specimens only), is one component of a comprehensive MRSA colonization surveillance program. It is not intended to diagnose MRSA infection nor to guide or monitor treatment for MRSA infections.      Radiological Exams on Admission: Ct Head Wo Contrast  12/11/2015  CLINICAL DATA:  Decreased responsiveness beginning 1 hour ago. EXAM: CT HEAD WITHOUT CONTRAST TECHNIQUE: Contiguous axial images were obtained from the base of the skull through the vertex without intravenous contrast. COMPARISON:  11/24/2015 FINDINGS: Skull and Sinuses:Recently characterized incomplete left zygomaticomaxillary complex fractures. No new osseous abnormality. Chronic left maxillary sinusitis with near complete opacification. Visualized orbits: No acute  finding Brain: No evidence of acute infarction, hemorrhage, hydrocephalus, or mass lesion/mass effect. Generalized atrophy. Diffuse chronic microvascular ischemic gliosis in the cerebral white matter. Chronic lacunar infarct in the right thalamus. Remote small cortical infarct in the high posterior right frontal cortex. IMPRESSION: 1. Stable since prior.  No acute intracranial  finding. 2. Nondisplaced left facial fractures as described previously. Electronically Signed   By: Monte Fantasia M.D.   On: 12/11/2015 23:33   Dg Chest Port 1 View  12/11/2015  CLINICAL DATA:  82 year old male with decreased responsiveness. EXAM: PORTABLE CHEST 1 VIEW COMPARISON:  Chest x-ray 12/08/2015. FINDINGS: Right pleural effusion. Probable atelectasis in the right lung base. Left lung is clear. No evidence of pulmonary edema. Mild cardiomegaly. Upper mediastinal contours are within normal limits. Atherosclerosis in the thoracic aorta. Right-sided biventricular pacemaker with lead tip projecting over the expected location of the right ventricle and overlying the left ventricle via the coronary sinus and coronary veins. Status post median sternotomy for CABG, including LIMA. IMPRESSION: 1. Right pleural effusion with probable subsegmental atelectasis in the right lower lobe. 2. Mild cardiomegaly. 3. Atherosclerosis. Electronically Signed   By: Vinnie Langton M.D.   On: 12/11/2015 22:43    EKG: Independently reviewed. Ventricularly-paced rhythm   Assessment/Plan  1. AMS  - Waxing waning confusion throughout the day prior to becoming unresponsive ~21:30 on 12/11/15  - Head CT with no acute findings; vitals stable; labs largely unchanged - Ddx includes acute CVA, non-convulsive seizures, toxic-metabolic encephalopathy, catatonia  - MRI brain wo is pending  - Neurology is on the case and much appreciated    2. UTI  - Culture growing E coli sensitive to Rocephin  - Continue treatment with Rocephin for 2 more days to complete course   3. CAD  - S/p CABG  - Troponin has been mildly elevated but stable in 0.1-0.2 range - Continue current management with ASA 81, statin    4. Chronic atrial fibrillation - CHADS-VASc is 71 (age x2, CHF, CAD) - Coumadin is on hold for 2 weeks since recent GIB - In a ventricularly-paced rhythm  - Rate is well-controlled on digoxin, will continue  at current dose  - Monitor on telemetry   5. CKD stage III  - SCr 1.46, consistent with his apparent baseline  - Follow fluid status, renal fxn  - Avoid nephrotoxic agents    6. Chronic systolic CHF - TTE (99991111) with EF 30-35%, akinesis at inferolateral wall and apex  - Continue home-dose Lasix 80 mg BID, if remains NPO, will convert to IV  - SLIV, strict I/Os, daily wts, fluid-restrict diet    7. Anemia  - Hgb is 8.5, MCV 92.7  - No sign of active blood loss; Hgb has remained stable since recent transfusion    8. Depression, anxiety  - Unable to assess at this time given the clinical scenario  - Monitor    DVT prophylaxis: sq heparin  Code Status: Full  Family Communication: None available  Disposition Plan: Admit to telemetry  Consults called: Neurology  Admission status: Inpatient    Vianne Bulls MD [facility] Pager [phone]  If 7PM-7AM, please contact night-coverage [email or web address] Password [email or web address]  12/12/2015, 12:10 AM

## 2015-12-12 NOTE — Progress Notes (Signed)
Pt has PACEMAKER and can not have a MRI.

## 2015-12-12 NOTE — Progress Notes (Signed)
Pharmacy Antibiotic Note  [name] is a 82 y.o. male admitted on 12/12/2015 with UTI.  Pharmacy has been consulted for Ceftriaxone dosing. Pt is a transfer from [facility] and has been on Ceftriaxone.   Plan: -Ceftriaxone 1g IV q24h to continue  Height: 5\' 10"  (177.8 cm) Weight: 110 lb 8 oz (50.122 kg) (bedscale) IBW/kg (Calculated) : 73  Temp (24hrs), Avg:98 F (36.7 C), Min:97.6 F (36.4 C), Max:98.6 F (37 C)   Recent Labs Lab 12/05/15 0516  12/05/15 1002 12/05/15 1520  12/06/15 1147  12/08/15 0425 12/08/15 0913 12/09/15 0453 12/10/15 0308 12/11/15 0224 12/11/15 2219 12/12/15 0040  WBC  --   < > 13.6* 19.1*  < >  --   < > 21.8*  --  19.9* 20.2* 16.4* 17.4*  --   CREATININE  --   --  2.11* 1.89*  < >  --   < >  --  1.36* 1.74* 1.53* 1.46* 1.50*  --   LATICACIDVEN 2.0  --  5.0* 6.9*  --  2.1*  --   --   --   --   --   --   --  1.8  < > = values in this interval not displayed.  Estimated Creatinine Clearance: 24.1 mL/min (by C-G formula based on Cr of 1.5).    Allergies  Allergen Reactions  . Penicillins Hives and Itching    Has patient had a PCN reaction causing immediate rash, facial/tongue/throat swelling, SOB or lightheadedness with hypotension: YES Has patient had a PCN reaction causing severe rash involving mucus membranes or skin necrosis: NO Has patient had a PCN reaction that required hospitalizationNO Has patient had a PCN reaction occurring within the last 10 years: NO If all of the above answers are "NO", then may proceed with Cephalosporin use.     [name] 12/12/2015 2:02 AM

## 2015-12-12 NOTE — Progress Notes (Signed)
Called  At [facility] per [place] regarding pending transfer to Neuro/Tele floor. Per Administrator Dr. Myna Hidalgo requesting transfer to neuro/tele floor for in depth neuro assessments. Able to provide neuro assessments on current floor. Upon my arrival discussion had with 3 Teacher, English as a foreign language regarding Pt status. Floor RN and Charge RN express concerns for Wife satisfaction of Patient care. Spoke with wife at bedside. Questions answered on Pt status and frequency of neuro checks. Neurologist Dr. Nicole Kindred at bedside 0320 to assess Patient. Pt Wife spoke with MD in depth about his neuro status. It was agreed by myself and Neurologist that Patient will get appropriate care on current floor. Keppra IV orders placed for possible seizures after MD assessment. Per MD Pt to receive Q2 hr Neuro checks until 0800 this am, Q4hr neuro checks to follow after that. Full neuro assessment completed and charted by myself at 0330.  Further interventions pending EEG for tomorrow. Wife satisfied with discussion and left to get some rest, will return in the AM. Floor RN to update Pt Primary Provider. RRT to follow tonight.

## 2015-12-12 NOTE — Progress Notes (Signed)
Subjective: Continues to be nearly unresponsive  Exam: Filed Vitals:   12/12/15 1123 12/12/15 1200  BP:  107/65  Pulse:  90  Temp: 96.7 F (35.9 C) 99.3 F (37.4 C)  Resp:  26   Gen: In bed, NAD Resp: non-labored breathing, no acute distress Abd: soft, nt  Neuro: MS: Eyes are open, does not respond to voice or follow commands CN: Mouth is open with tongue midline, mouth appears relatively symmetric, eyes are midline Motor: Minimal withdrawal, does seem to have some spontaneous movement of his right arm Sensory: Minimal response to noxious stimuli  Pertinent Labs: Creatinine 1.5  EEG appears relatively unremarkable  Impression: 82 year old male with acute unresponsiveness. His EEG is remarkably normal for the degree of apparent encephalopathy. He has an intact brain stem, and therefore it would be unusual for him to have basilar thrombosis, however within normal appearing EEG I now think that this is something that needs to be considered.  Recommendations: 1) stat CT Angio head and neck 2)  neurology will continue to follow   Roland Rack, MD [facility] [phone]  If 7pm- 7am, please page neurology on call as listed in [place].

## 2015-12-12 NOTE — Progress Notes (Signed)
Patient ID: [name], male   DOB: Dec 25, 1924, 82 y.o.   MRN: [id number]  PROGRESS NOTE    [name]  [id number] DOB: 18-Oct-1924 DOA: 12/12/2015  PCP: Donnajean Lopes, MD  Outpatient Specialists: Cardiology, Dr. Crissie Sickles  Brief Narrative:  Pt admitted after midnight, please refer to admission note done 12/12/2015.  82 y.o. male with medical history significant for atrial fibrillation on Coumadin, CAD status post CABG, chronic kidney disease stage III, chronic systolic CHF, depression and anxiety, just discharged to inpatient rehabilitation 12/11/2015 after his hospitalization for GI bleed. He had tagged red blood cell study and sigmoidoscopy with identification of a bleeding rectal ulcer. He has received total of 80 units PRBC during that hospital stay. Patient transferred from rehabilitation to the telemetry floor 12/12/2015 after he was found to be unresponsive. Patient was unable to speak and the only thing he was able to do his blink eyes. Unable to do MRI because patient has pacemaker. The CT scan did not show acute intracranial findings. Neurology saw the patient in consultation   Assessment & Plan:   Acute metabolic encephalopathy / possible stroke versus seizure - Unclear etiology. Patient is unresponsive to verbal or painful stimuli. Oxygen saturation is 100% with nasal cannula oxygen support - CT head showed no acute intracranial findings - Unable to do MRI because patient has pacemaker - His Coumadin was stopped during recent hospitalization due to risk of bleeding. We stopped aspirin and subcutaneous heparin because of risk of bleeding - He was started on Keppra for possible seizures - Follow up on EEG results - Consult placed for PCT for goals of care  Hospital Course:  Escherichia coli UTI. - Continue Rocephin   Acute on chronic systolic and diastolic CHF, NYHA class 3/ Ischemic cardiomyopathy - 2-D echo 4/24 showed EF of 30-35%. PA pressure 64.  Chronic  atrial fibrillation, complete heart block - [place] VASC 6 - Holding aspirin because of risk of bleeding - Heart rate 89   DVT prophylaxis: SCDs bilaterally due to risk of bleeding Code Status: full code  Family Communication: Spoke with the patient's wife over the phone this morning Disposition Plan: Appreciate palliative care addressing goals of care   Consultants:   Palliative care  Neurology  Procedures:   None  Antimicrobials:   Rocephin 12/12/2015    Subjective: Patient unresponsive to verbal or painful stimuli. Otherwise no overnight events.  Objective: Filed Vitals:   12/12/15 0137 12/12/15 0522 12/12/15 0800  BP: 115/57 98/61 105/67  Pulse: 93 89 89  Temp: 97.6 F (36.4 C) 97.5 F (36.4 C)   TempSrc: Oral Oral   Resp: 16 14 14   Height: 5\' 10"  (1.778 m)    Weight: 50.122 kg (110 lb 8 oz)    SpO2: 99% 99% 100%    Intake/Output Summary (Last 24 hours) at 12/12/15 0940 Last data filed at 12/12/15 0600  Gross per 24 hour  Intake    110 ml  Output    200 ml  Net    -90 ml   Filed Weights   12/12/15 0137  Weight: 50.122 kg (110 lb 8 oz)    Examination:  General exam: Appears calm, does not respond to verbal or painful stimuli  Respiratory system: Clear to auscultation. Respiratory effort normal. Cardiovascular system: S1 & S2 heard, RRR.  Gastrointestinal system: Abdomen is nondistended, soft and nontender. No organomegaly or masses felt. Normal bowel sounds heard. Central nervous system: unresponsive to pain stimuli, blinks eyes, cant follow  commands  Extremities: no swelling, no cyanosis  Skin: No rashes, lesions Psychiatry: Unable to test due to pt mental status   Data Reviewed: I have personally reviewed following labs and imaging studies  CBC:  Recent Labs Lab 12/05/15 1002  12/08/15 0425 12/09/15 0453 12/10/15 0308 12/11/15 0224 12/11/15 2219  WBC 13.6*  < > 21.8* 19.9* 20.2* 16.4* 17.4*  NEUTROABS 11.9*  --   --   --   --   --   14.4*  HGB 6.9*  < > 8.0* 8.3* 8.6* 8.5* 8.8*  HCT 20.7*  < > 23.3* 24.1* 25.7* 25.7* 26.5*  MCV 93.2  < > 86.0 88.3 89.2 90.5 92.7  PLT 81*  < > 106* 130* 192 199 214  < > = values in this interval not displayed. Basic Metabolic Panel:  Recent Labs Lab 12/05/15 1002  12/06/15 0518  12/08/15 0913 12/09/15 0453 12/10/15 0308 12/11/15 0224 12/11/15 2219  NA 136  < > 130*  < > 129* 128* 133* 135 133*  K 4.7  < > 5.3*  < > 5.0 5.0 4.7 4.3 4.5  CL 107  < > 105  < > 104 100* 101 100* 99*  CO2 15*  < > 17*  < > 19* 18* 21* 23 24  GLUCOSE 203*  < > 94  < > 98 153* 203* 160* 115*  BUN 83*  < > 74*  < > 54* 60* 64* 57* 61*  CREATININE 2.11*  < > 1.79*  < > 1.36* 1.74* 1.53* 1.46* 1.50*  CALCIUM 7.1*  < > 6.5*  < > 7.7* 7.7* 7.8* 8.0* 7.8*  MG 1.7  --  1.4*  --   --   --   --   --   --   PHOS 3.8  --  4.0  --   --   --   --   --   --   < > = values in this interval not displayed. GFR: Estimated Creatinine Clearance: 24.1 mL/min (by C-G formula based on Cr of 1.5). Liver Function Tests: No results for input(s): AST, ALT, ALKPHOS, BILITOT, PROT, ALBUMIN in the last 168 hours. No results for input(s): LIPASE, AMYLASE in the last 168 hours.  Recent Labs Lab 12/12/15 0632  AMMONIA 23   Coagulation Profile:  Recent Labs Lab 12/07/15 0529 12/08/15 0425 12/09/15 0453 12/10/15 0308 12/11/15 0224  INR 1.79* 1.86* 1.65* 1.66* 1.47   Cardiac Enzymes:  Recent Labs Lab 12/05/15 1002 12/05/15 1520 12/05/15 1843 12/11/15 2219 12/12/15 0631  TROPONINI 0.10* 0.13* 0.20* 0.19* 0.19*   BNP (last 3 results) No results for input(s): PROBNP in the last 8760 hours. HbA1C: No results for input(s): HGBA1C in the last 72 hours. CBG:  Recent Labs Lab 12/11/15 2104 12/11/15 2150 12/12/15 0208 12/12/15 0419 12/12/15 0822  GLUCAP 108* 107* 125* 147* 100*   Lipid Profile: No results for input(s): CHOL, HDL, LDLCALC, TRIG, CHOLHDL, LDLDIRECT in the last 72 hours. Thyroid Function  Tests: No results for input(s): TSH, T4TOTAL, FREET4, T3FREE, THYROIDAB in the last 72 hours. Anemia Panel:  Recent Labs  12/12/15 0631  VITAMINB12 1210*   Urine analysis:    Component Value Date/Time   COLORURINE YELLOW 12/05/2015 0055   APPEARANCEUR CLOUDY* 12/05/2015 0055   LABSPEC 1.012 12/05/2015 0055   PHURINE 6.0 12/05/2015 0055   GLUCOSEU NEGATIVE 12/05/2015 0055   HGBUR MODERATE* 12/05/2015 0055   BILIRUBINUR NEGATIVE 12/05/2015 0055   KETONESUR NEGATIVE 12/05/2015 0055  PROTEINUR NEGATIVE 12/05/2015 0055   UROBILINOGEN 0.2 06/28/2014 1401   NITRITE NEGATIVE 12/05/2015 0055   LEUKOCYTESUR LARGE* 12/05/2015 0055   Sepsis Labs: @LABRCNTIP (procalcitonin:4,lacticidven:4)  Recent Results (from the past 240 hour(s))  Urine culture     Status: Abnormal   Collection Time: 12/05/15 12:55 AM  Result Value Ref Range Status   Specimen Description URINE, CATHETERIZED  Final   Special Requests NONE  Final   Culture >=100,000 COLONIES/mL ESCHERICHIA COLI (A)  Final   Report Status 12/07/2015 FINAL  Final   Organism ID, Bacteria ESCHERICHIA COLI (A)  Final      Susceptibility   Escherichia coli - MIC*    AMPICILLIN >=32 RESISTANT Resistant     CEFAZOLIN <=4 SENSITIVE Sensitive     CEFTRIAXONE <=1 SENSITIVE Sensitive     CIPROFLOXACIN <=0.25 SENSITIVE Sensitive     GENTAMICIN <=1 SENSITIVE Sensitive     IMIPENEM <=0.25 SENSITIVE Sensitive     NITROFURANTOIN <=16 SENSITIVE Sensitive     TRIMETH/SULFA <=20 SENSITIVE Sensitive     AMPICILLIN/SULBACTAM >=32 RESISTANT Resistant     PIP/TAZO <=4 SENSITIVE Sensitive     * >=100,000 COLONIES/mL ESCHERICHIA COLI  MRSA PCR Screening     Status: None   Collection Time: 12/05/15  5:45 AM  Result Value Ref Range Status   MRSA by PCR NEGATIVE NEGATIVE Final    Comment:        The GeneXpert MRSA Assay (FDA approved for NASAL specimens only), is one component of a comprehensive MRSA colonization surveillance program. It is  not intended to diagnose MRSA infection nor to guide or monitor treatment for MRSA infections.       Radiology Studies: Ct Head Wo Contrast 12/11/2015   1. Stable since prior.  No acute intracranial finding. 2. Nondisplaced left facial fractures as described previously. Electronically Signed   By: Monte Fantasia M.D.   On: 12/11/2015 23:33   Dg Chest Port 1 View 12/11/2015  1. Right pleural effusion with probable subsegmental atelectasis in the right lower lobe. 2. Mild cardiomegaly. 3. Atherosclerosis. Electronically Signed   By: Vinnie Langton M.D.   On: 12/11/2015 22:43     Scheduled Meds: . cefTRIAXone (ROCEPHIN)  IV  1 g Intravenous Q24H  . insulin aspart  0-9 Units Subcutaneous Q4H  . levETIRAcetam  500 mg Intravenous Q12H   Continuous Infusions:    LOS: 0 days    Time spent: 25 minutes    Leisa Lenz, MD [facility] Pager [phone]  If 7PM-7AM, please contact night-coverage [email or web address] Password [facility] 12/12/2015, 9:40 AM

## 2015-12-12 NOTE — Progress Notes (Signed)
Pacemaker not sensing underlying rhythm,  pacer spikes noted on underlying QRS causing PVCs. [facility] p[facility]. Per [facility] call Cards. Dr. Susy Manor called and made aware.   Pt has medtronics pacemaker.

## 2015-12-12 NOTE — Progress Notes (Signed)
Wife called stating patient was having seizure. Upon entering room, patient was noted to be calm but mumbling. Questioned the wife what patient was doing when he was having a seizure, did notice feet were flapping up and down before he was trying to climb out of bed and was talking to them. Patient was able to state name and looking around room. Then patient seemed to regress back into unresponsive state.

## 2015-12-12 NOTE — Progress Notes (Signed)
Spoke with wife, informed her of transfer to 2S. Verbalized understanding .

## 2015-12-12 NOTE — Progress Notes (Signed)
Notified pt wife at 2225 of pt change in condition and updated status of pending transfer at 2340. Pt wife was calm and receptive, but voiced her concerned over the phone. Will keep wife notified of patient status and updated status about transfer until she arrived at the hospital.

## 2015-12-12 NOTE — Progress Notes (Signed)
Talked to rapid response about pt coming to our floor unresponsive from 4W, rapid response nurse said that patient is stable to be admitted here in our floor.
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
## 2015-12-12 NOTE — Progress Notes (Signed)
Rapid response assessed pt and spoke with wife. Pt will remain on the floor until morning when he will be re-evaluated. EEG ordered for am.

## 2015-12-12 NOTE — Progress Notes (Signed)
Received pt from [place] unresponsive pt blinking but not following fingers. Dr Myna Hidalgo notified and talked with pt's wife. Orders to transfer to neuro floor.

## 2015-12-12 NOTE — IPOC Note (Signed)
Patient admitted to rehab but was transferred to acute after a stay of less than 12 hours. No team evaluation could be completed.

## 2015-12-12 NOTE — Consult Note (Signed)
Admission H&P    Chief Complaint: Altered mental status.  HPI: [name] is an 82 y.o. male with a history of atrial fibrillation, coronary artery disease, renal insufficiency, hypertension, hyperlipidemia, diabetes and CHF, admitted initially on 12/05/2015 for acute lower GI hemorrhage. Patient was on anticoagulation at the time, which has been discontinued. He developed hemorrhagic shock and required blood transfusion. He has also been on antibiotic treatment with Rocephin for UTI. He was discharged on 12/11/2015 and noted to inpatient rehabilitation. Patient was noted to be confused by nursing staff as well as his wife earlier today. At one point he did not recognize his wife and was freely confabulating. At about 9 PM he was noted by nursing staff to be unresponsive verbally and had fallen to the floor and sustained facial injury. CT scan of his head showed no acute abnormality. Nondisplaced left facial fractures noted. No focal deficits were noted. He has remained nonverbal but reacts to visual threat. He has no history of seizure disorder. His fall to the floor was apparently unwitnessed. Laboratory studies were remarkable for elevated lactic acid, and elevated RBC count at 17,000, down from 20,000 on 12/10/2015.  Past Medical History  Diagnosis Date  . Atrial fibrillation (HCC)     Chronic  . Coronary artery disease 2004    Status post CABG with LIMA-LAD, SVG-CFX. Hx of anterior apical infarct.  . Renal insufficiency     Chronic  . Hypertension   . Hyperlipidemia   . Gout   . CHF (congestive heart failure) (HCC)     with chronic ischemic cardiomypathy. EF of 10-15%.  CHF due to systolic dysfunction. Clinically doing well.  [name] GERD (gastroesophageal reflux disease)   . Pacemaker     Medtronic biventricular pacemaker, serial number [id number]  . Type II diabetes mellitus ([place])     "been off my RX for ~ 2 yr" (03/20/2013)  . H/O hiatal hernia   . Arthritis     "right thumb" (03/20/2013)   . Depression   . Anxiety   . Bladder cancer ([place])     "low grade; grade 1; non-invasive" (03/20/2013)  . CKD (chronic kidney disease), stage III     Past Surgical History  Procedure Laterality Date  . Transurethral resection of prostate  1990's; 2004    "twice; injected chemo 1st time; didn't do that 2nd time" (03/20/2013)  . Nasal septum surgery    . US echocardiography  04-17-2009    Est EF 10-15%  . Cardiovascular stress test  04-17-2009    EF 29%  . Coronary artery bypass graft  02/19/2003    LIMA-LAD, SVG-CFX  . Carotid endarterectomy Right 1990's    "left side is still  100% blocked" (03/20/2013)  . Icd lead removal Left 03/01/2013    Procedure: ICD LEAD REMOVAL;  Surgeon: Evans Lance, MD;  Location: [place];  Service: Cardiovascular;  Laterality: Left;  . Pacemaker lead removal Left 03/01/2013    Procedure: PACEMAKER LEAD REMOVAL;  Surgeon: Evans Lance, MD;  Location: [place];  Service: Cardiovascular;  Laterality: Left;  . Pacemaker insertion N/A 03/01/2013    Procedure: INSERTION PACEMAKER LEAD;  Surgeon: Evans Lance, MD;  Location: [place];  Service: Cardiovascular;  Laterality: N/A;  . Hemorrhoid surgery      "lanced several years ago" (03/20/2013)  . Tonsillectomy and adenoidectomy  1933  . Cataract extraction w/ intraocular lens  implant, bilateral Bilateral ~ 2000  . Bi-ventricular pacemaker insertion N/A 03/20/2013  Procedure: BI-VENTRICULAR PACEMAKER INSERTION (CRT-P);  Surgeon: Evans Lance, MD; Medtronic biventricular pacemaker, serial number [id number]; Laterality: Right  . Flexible sigmoidoscopy N/A 12/05/2015    Procedure: FLEXIBLE SIGMOIDOSCOPY;  Surgeon: [name], MD;  Location: [place];  Service: Endoscopy;  Laterality: N/A;    Family History  Problem Relation Age of Onset  . Heart attack Mother    Social History:  reports that he quit smoking about 55 years ago. His smoking use included Cigarettes. He has a 17 pack-year smoking history. He has never  used smokeless tobacco. He reports that he drinks alcohol. He reports that he does not use illicit drugs.  Allergies:  Allergies  Allergen Reactions  . Penicillins Hives and Itching    Has patient had a PCN reaction causing immediate rash, facial/tongue/throat swelling, SOB or lightheadedness with hypotension: YES Has patient had a PCN reaction causing severe rash involving mucus membranes or skin necrosis: NO Has patient had a PCN reaction that required hospitalizationNO Has patient had a PCN reaction occurring within the last 10 years: NO If all of the above answers are "NO", then may proceed with Cephalosporin use.    Medications Prior to Admission  Medication Sig Dispense Refill  . allopurinol (ZYLOPRIM) 300 MG tablet Take 300 mg by mouth daily.      [name] ALPRAZolam (XANAX) 0.5 MG tablet Take 1 mg by mouth at bedtime.     . calcium-vitamin D (OSCAL WITH D) 500-200 MG-UNIT per tablet Take 1 tablet by mouth daily.     . digoxin (LANOXIN) 0.125 MG tablet Take 0.0625 mg by mouth daily.     . furosemide (LASIX) 40 MG tablet Take 80 mg by mouth daily.    [name] HYDROcodone-acetaminophen (NORCO/VICODIN) 5-325 MG per tablet Take 0.5 tablets by mouth at bedtime.     [name] ipratropium (ATROVENT) 0.03 % nasal spray Place 1-2 sprays into both nostrils 3 (three) times daily as needed for rhinitis (congestion).   4  . lovastatin (MEVACOR) 20 MG tablet Take 20 mg by mouth at bedtime. Reported on 11/24/2015    . megestrol (MEGACE) 40 MG/ML suspension Take 400 mg by mouth 2 (two) times daily. 10 mls - every morning and at 3pm    . Melatonin 10 MG TABS Take 10 mg by mouth at bedtime.    . multivitamin-lutein (OCUVITE-LUTEIN) CAPS Take 1 capsule by mouth daily.    . Naphazoline HCl (CLEAR EYES OP) Place 1 drop into both eyes daily as needed (irritation/ eye drop).    . Triamcinolone Acetonide (NASACORT AQ NA) Place 1 spray into both nostrils daily.    . vitamin B-12 (CYANOCOBALAMIN) 1000 MCG tablet Take 1,000 mcg by  mouth daily.      ROS: Unobtainable due to patient's mental status changes  Physical Examination: Blood pressure 115/57, pulse 93, temperature 97.6 F (36.4 C), temperature source Oral, resp. rate 16, height 5\' 10"  (1.778 m), weight 50.122 kg (110 lb 8 oz), SpO2 99 %.  HEENT-  Normocephalic, no lesions, without obvious abnormality.  Normal external eye and conjunctiva.  Normal TM's bilaterally.  Normal auditory canals and external ears. Normal external nose, mucus membranes and septum.  Normal pharynx. Left facial ecchymosis noted Neck supple with no masses, nodes, nodules or enlargement. Cardiovascular - regular rate and rhythm, S1, S2 normal, no murmur, click, rub or gallop Lungs - chest clear, no wheezing, rales, normal symmetric air entry Abdomen - soft, non-tender; bowel sounds normal; no masses,  no organomegaly Extremities -  no joint deformities, effusion, or inflammation and no edema  Neurologic Examination: Patient did not respond verbally. Eyes were open and he responded readily to visual threat as well as resisted eye opening on attempting to examine his pupils. He also followed simple commands at times. Pupils were equal and reacted normally to light. Extraocular movements were intact with oculocephalic maneuvers. Face was symmetrical with no focal weakness. Motor exam showed flaccid muscle tone throughout. He had voluntary movement of all 4 extremities, at least a minimally. Deep tendon reflexes were 1+ and symmetrical. Plantar responses were mute.  Results for orders placed or performed during the hospital encounter of 12/12/15 (from the past 48 hour(s))  Glucose, capillary     Status: Abnormal   Collection Time: 12/12/15  2:08 AM  Result Value Ref Range   Glucose-Capillary 125 (H) 65 - 99 mg/dL   Ct Head Wo Contrast  12/11/2015  CLINICAL DATA:  Decreased responsiveness beginning 1 hour ago. EXAM: CT HEAD WITHOUT CONTRAST TECHNIQUE: Contiguous axial images were obtained  from the base of the skull through the vertex without intravenous contrast. COMPARISON:  11/24/2015 FINDINGS: Skull and Sinuses:Recently characterized incomplete left zygomaticomaxillary complex fractures. No new osseous abnormality. Chronic left maxillary sinusitis with near complete opacification. Visualized orbits: No acute finding Brain: No evidence of acute infarction, hemorrhage, hydrocephalus, or mass lesion/mass effect. Generalized atrophy. Diffuse chronic microvascular ischemic gliosis in the cerebral white matter. Chronic lacunar infarct in the right thalamus. Remote small cortical infarct in the high posterior right frontal cortex. IMPRESSION: 1. Stable since prior.  No acute intracranial finding. 2. Nondisplaced left facial fractures as described previously. Electronically Signed   By: Monte Fantasia M.D.   On: 12/11/2015 23:33   Dg Chest Port 1 View  12/11/2015  CLINICAL DATA:  82 year old male with decreased responsiveness. EXAM: PORTABLE CHEST 1 VIEW COMPARISON:  Chest x-ray 12/08/2015. FINDINGS: Right pleural effusion. Probable atelectasis in the right lung base. Left lung is clear. No evidence of pulmonary edema. Mild cardiomegaly. Upper mediastinal contours are within normal limits. Atherosclerosis in the thoracic aorta. Right-sided biventricular pacemaker with lead tip projecting over the expected location of the right ventricle and overlying the left ventricle via the coronary sinus and coronary veins. Status post median sternotomy for CABG, including LIMA. IMPRESSION: 1. Right pleural effusion with probable subsegmental atelectasis in the right lower lobe. 2. Mild cardiomegaly. 3. Atherosclerosis. Electronically Signed   By: Vinnie Langton M.D.   On: 12/11/2015 22:43    Assessment/Plan 82 year old man with acute change in mental status of unclear etiology. New onset seizure activity is highly suspected. Ongoing partial seizure activity contributing to his continued status changes  cannot be ruled out. Acute stroke cannot be ruled out. Unfortunately MRI study cannot be obtained because patient has cardiac pacemaker.  Recommendations: 1. Keppra 1000 mg IV loading dose followed by 500 mg every 12 hours 2. EEG this a.m., routine study. If ongoing seizure activity is demonstrated, long-term video EEG recording will be considered, as well as medication adjustments as needed. 3. Antibiotic management per care team  We will continue to follow this patient with you.  C.R. Nicole Kindred, MD [facility] [phone]  12/12/2015, 3:42 AM

## 2015-12-12 NOTE — Progress Notes (Signed)
Pt admitted to rm 3E27 from 4W, pt unresponsive, nonverbal, wife at bedside wants pt to be transferred to higher level of care, Dr. Myna Hidalgo notified and came to assessed the patient, Dr. Myna Hidalgo wrote a transfer order to neuro floor for constant monitoring and neuro check, rapid response nurse came and wants patient to stay here until AM for evaluation. Primary nurse made aware and neuro checks done every 2 hours. Will continue to monitor.

## 2015-12-12 NOTE — Progress Notes (Signed)
Patient arrived to unit from floor. Neuro assessment conducted and pupils noticed to be uneven, but brisk and reactive. Pt continues to be non verbal, unresponsive, BUE no response to pain and BLE respond to pain. Neuro paged. Dr. Leonel Ramsay to bedside to assess patient.

## 2015-12-12 NOTE — Significant Event (Signed)
At 2130 Nurse reported to patient room for scheduled med pass, noted patient did not respond to verbal commands, and unresponsive. Pt mouth was open and fixated toward the ceiling with eyes blinking. Did not follow commands when asked. VS taken and stable, and rapid response notified at 2140 who arrived shortly within 10 minutes. Pt was verbal earlier in the shift at 2015 during assessment and with the NT at 2100, which the patient was confused, he refused his medication and said "that he was dead and dying" staff assured him that he was in fact living and showed him how he was breathing and had a pulse, but pt did not believe [place]. VS continued to be stable and checking frequently. Dr. Letta Pate MD notified at 2145, orders received. Will continue to monitor pt closely.

## 2015-12-12 NOTE — Progress Notes (Signed)
Bedside EEG completed, results pending. 

## 2015-12-13 ENCOUNTER — Encounter (HOSPITAL_COMMUNITY): Payer: Self-pay | Admitting: Radiology

## 2015-12-13 ENCOUNTER — Inpatient Hospital Stay (HOSPITAL_COMMUNITY): Payer: Medicare Other

## 2015-12-13 LAB — HIV ANTIBODY (ROUTINE TESTING W REFLEX): HIV SCREEN 4TH GENERATION: NONREACTIVE

## 2015-12-13 LAB — GLUCOSE, CAPILLARY
GLUCOSE-CAPILLARY: 95 mg/dL (ref 65–99)
Glucose-Capillary: 104 mg/dL — ABNORMAL HIGH (ref 65–99)
Glucose-Capillary: 108 mg/dL — ABNORMAL HIGH (ref 65–99)
Glucose-Capillary: 109 mg/dL — ABNORMAL HIGH (ref 65–99)
Glucose-Capillary: 142 mg/dL — ABNORMAL HIGH (ref 65–99)
Glucose-Capillary: 163 mg/dL — ABNORMAL HIGH (ref 65–99)
Glucose-Capillary: 182 mg/dL — ABNORMAL HIGH (ref 65–99)

## 2015-12-13 LAB — RPR: RPR Ser Ql: NONREACTIVE

## 2015-12-13 MED ORDER — IOPAMIDOL (ISOVUE-370) INJECTION 76%
50.0000 mL | Freq: Once | INTRAVENOUS | Status: AC | PRN
  Administered 2015-12-13: 50 mL via INTRAVENOUS

## 2015-12-13 MED ORDER — RESOURCE THICKENUP CLEAR PO POWD
ORAL | Status: DC | PRN
  Filled 2015-12-13: qty 125

## 2015-12-13 MED ORDER — DIVALPROEX SODIUM 500 MG PO DR TAB
750.0000 mg | DELAYED_RELEASE_TABLET | Freq: Two times a day (BID) | ORAL | Status: DC
  Administered 2015-12-13 – 2015-12-15 (×5): 750 mg via ORAL
  Filled 2015-12-13 (×6): qty 1

## 2015-12-13 MED ORDER — VALPROATE SODIUM 500 MG/5ML IV SOLN
500.0000 mg | Freq: Two times a day (BID) | INTRAVENOUS | Status: DC
  Administered 2015-12-13: 500 mg via INTRAVENOUS
  Filled 2015-12-13 (×2): qty 5

## 2015-12-13 NOTE — Progress Notes (Signed)
Subjective: Greatly improved.   Exam: Filed Vitals:   12/13/15 0400 12/13/15 0748  BP: 109/68 109/69  Pulse: 90 91  Temp: 98.6 F (37 C) 99.8 F (37.7 C)  Resp: 22 17   Gen: In bed, NAD Resp: non-labored breathing, no acute distress Abd: soft, nt  Neuro: MS: Awake, oriented to person and place.  CN: Slightly asymmetric pupils(not new).  Motor: Follows commands with good strength bialterally.  Sensory: Minimal response to noxious stimuli  CTA head and neck no acute findings Elevated lactate on admission.   Impression: 82 year old male with acute unresponsiveness. I saw him and he looked catatonic, suspected NCSE, got a STAT EEG which was remarkably normal for the degree of apparent encephalopathy at the time of the EEG. He markedly improved over the course of the next 24 hours.   I continue to suspect that he may have had seizure as presenting problem, with post-ictal state representing what I saw yesterday morning though I would have expected some degree of slowing with that which was not apparent. I would favor continuing AED, but keppra may have been contributing to agitation, will change to depakote.  Encephalopathy due to UTI also possible, but with the suddeness of the change(both worsening and improvement) I suspect seizure.    Recommendations: 1) Depakote 750mg  BID.  2)  neurology will continue to follow   Roland Rack, MD [facility] [phone]  If 7pm- 7am, please page neurology on call as listed in [place].

## 2015-12-13 NOTE — Evaluation (Signed)
Clinical/Bedside Swallow Evaluation Patient Details  Name: [name] MRN: [id number] Date of Birth: Jul 18, 1925  Today's Date: 12/13/2015 Time: SLP Start Time (ACUTE ONLY): 1457 SLP Stop Time (ACUTE ONLY): 1514 SLP Time Calculation (min) (ACUTE ONLY): 17 min  Past Medical History:  Past Medical History  Diagnosis Date  . Atrial fibrillation (HCC)     Chronic  . Coronary artery disease 2004    Status post CABG with LIMA-LAD, SVG-CFX. Hx of anterior apical infarct.  . Renal insufficiency     Chronic  . Hypertension   . Hyperlipidemia   . Gout   . CHF (congestive heart failure) (HCC)     with chronic ischemic cardiomypathy. EF of 10-15%.  CHF due to systolic dysfunction. Clinically doing well.  [name] GERD (gastroesophageal reflux disease)   . Pacemaker     Medtronic biventricular pacemaker, serial number [id number]  . Type II diabetes mellitus ([place])     "been off my RX for ~ 2 yr" (03/20/2013)  . H/O hiatal hernia   . Arthritis     "right thumb" (03/20/2013)  . Depression   . Anxiety   . Bladder cancer ([place])     "low grade; grade 1; non-invasive" (03/20/2013)  . CKD (chronic kidney disease), stage III    Past Surgical History:  Past Surgical History  Procedure Laterality Date  . Transurethral resection of prostate  1990's; 2004    "twice; injected chemo 1st time; didn't do that 2nd time" (03/20/2013)  . Nasal septum surgery    . US echocardiography  04-17-2009    Est EF 10-15%  . Cardiovascular stress test  04-17-2009    EF 29%  . Coronary artery bypass graft  02/19/2003    LIMA-LAD, SVG-CFX  . Carotid endarterectomy Right 1990's    "left side is still  100% blocked" (03/20/2013)  . Icd lead removal Left 03/01/2013    Procedure: ICD LEAD REMOVAL;  Surgeon: Evans Lance, MD;  Location: [facility];  Service: Cardiovascular;  Laterality: Left;  . Pacemaker lead removal Left 03/01/2013    Procedure: PACEMAKER LEAD REMOVAL;  Surgeon: Evans Lance, MD;  Location: [facility];  Service:  Cardiovascular;  Laterality: Left;  . Pacemaker insertion N/A 03/01/2013    Procedure: INSERTION PACEMAKER LEAD;  Surgeon: Evans Lance, MD;  Location: [facility];  Service: Cardiovascular;  Laterality: N/A;  . Hemorrhoid surgery      "lanced several years ago" (03/20/2013)  . Tonsillectomy and adenoidectomy  1933  . Cataract extraction w/ intraocular lens  implant, bilateral Bilateral ~ 2000  . Bi-ventricular pacemaker insertion N/A 03/20/2013    Procedure: BI-VENTRICULAR PACEMAKER INSERTION (CRT-P);  Surgeon: Evans Lance, MD; Medtronic biventricular pacemaker, serial number [id number]; Laterality: Right  . Flexible sigmoidoscopy N/A 12/05/2015    Procedure: FLEXIBLE SIGMOIDOSCOPY;  Surgeon: [name], MD;  Location: [facility];  Service: Endoscopy;  Laterality: N/A;   HPI:  82 y.o. male with medical history significant for atrial fibrillation on Coumadin, CAD status post CABG, chronic kidney disease stage III, chronic systolic CHF, depression and anxiety, just discharged to inpatient rehabilitation 12/11/2015 after his hospitalization for GI bleed. Patient transferred from rehabilitation to the telemetry floor 12/12/2015 after he was found to be unresponsive.   Assessment / Plan / Recommendation Clinical Impression  Pt is impulsive with PO intake, requiring Mod cues for slower pacing. Immediate coughing is noted after consumption of thin liquids and even larger boluses of nectar thick liquids. Overt signs of penetration/aspiration  reduced with SLP cueing for rate and removal of straw. Pt has prolonged mastication of solids which is baseline per wife, although cough x1 following solid trials may be indicative of premature spillage and/or spillage of residue. Max cues provided to alternate between bites/sips to faciltiate oral clearance. Recommend Dys 3 diet and nectar thick liquids by cup. Will continue to follow for tolerance and possible advancement.    Aspiration Risk  Mild aspiration  risk;Moderate aspiration risk    Diet Recommendation Dysphagia 3 (Mech soft);Nectar-thick liquid   Liquid Administration via: Cup;No straw Medication Administration: Whole meds with puree Supervision: Patient able to self feed;Full supervision/cueing for compensatory strategies Compensations: Minimize environmental distractions;Slow rate;Small sips/bites;Follow solids with liquid Postural Changes: Seated upright at 90 degrees;Remain upright for at least 30 minutes after po intake    Other  Recommendations Oral Care Recommendations: Oral care BID Other Recommendations: Order thickener from pharmacy;Prohibited food (jello, ice cream, thin soups);Remove water pitcher   Follow up Recommendations  Inpatient Rehab    Frequency and Duration min 2x/week  2 weeks       Prognosis Prognosis for Safe Diet Advancement: Good      Swallow Study   General HPI: 82 y.o. male with medical history significant for atrial fibrillation on Coumadin, CAD status post CABG, chronic kidney disease stage III, chronic systolic CHF, depression and anxiety, just discharged to inpatient rehabilitation 12/11/2015 after his hospitalization for GI bleed. Patient transferred from rehabilitation to the telemetry floor 12/12/2015 after he was found to be unresponsive. Type of Study: Bedside Swallow Evaluation Previous Swallow Assessment: none in chart Diet Prior to this Study: Dysphagia 3 (soft);Thin liquids Temperature Spikes Noted: No Respiratory Status: Nasal cannula History of Recent Intubation: No Behavior/Cognition: Alert;Cooperative;Pleasant mood;Confused;Requires cueing;Impulsive Oral Cavity Assessment: Within Functional Limits Oral Care Completed by SLP: No Oral Cavity - Dentition: Poor condition;Missing dentition Vision: Functional for self-feeding Self-Feeding Abilities: Able to feed self Patient Positioning: Upright in bed Baseline Vocal Quality: Normal Volitional Cough: Strong    Oral/Motor/Sensory  Function Overall Oral Motor/Sensory Function: Within functional limits   Ice Chips Ice chips: Not tested   Thin Liquid Thin Liquid: Impaired Presentation: Cup;Self Fed;Straw Pharyngeal  Phase Impairments: Suspected delayed Swallow;Cough - Immediate    Nectar Thick Nectar Thick Liquid: Impaired Presentation: Cup;Self Fed;Straw Pharyngeal Phase Impairments: Suspected delayed Swallow;Wet Vocal Quality;Cough - Immediate   Honey Thick Honey Thick Liquid: Not tested   Puree Puree: Not tested   Solid   GO   Solid: Impaired Presentation: Self Fed Oral Phase Functional Implications: Impaired mastication;Prolonged oral transit Pharyngeal Phase Impairments: Cough - Immediate       Germain Osgood, M.A. CCC-SLP [phone]  Germain Osgood 12/13/2015,4:12 PM

## 2015-12-13 NOTE — Progress Notes (Addendum)
Patient ID: [name], male   DOB: Oct 30, 1924, 82 y.o.   MRN: [id number]  PROGRESS NOTE    [name]  [id number] DOB: 12/04/1924 DOA: 12/12/2015  PCP: Donnajean Lopes, MD  Outpatient Specialists: Cardiology, Dr. Crissie Sickles  Brief Narrative:   82 y.o. male with medical history significant for atrial fibrillation on Coumadin, CAD status post CABG, chronic kidney disease stage III, chronic systolic CHF, depression and anxiety, just discharged to inpatient rehabilitation 12/11/2015 after his hospitalization for GI bleed. He had tagged red blood cell study and sigmoidoscopy with identification of a bleeding rectal ulcer. He has received total of 8 units PRBC during that hospital stay. Patient transferred from rehabilitation to the telemetry floor 12/12/2015 after he was found to be unresponsive. Patient was unable to speak and the only thing he was able to do his blink eyes. Unable to do MRI because patient has pacemaker. The CT scan did not show acute intracranial findings. Neurology saw the patient in consultation   Assessment & Plan:   Acute metabolic encephalopathy / possible stroke  - Unclear etiology. 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 Moderate stenosis bilateral vertebral artery origins which are otherwise widely patent - Unable to do MRI because patient has pacemaker - Coumadin was stopped during recent hospitalization due to risk of bleeding. We stopped aspirin and subcutaneous heparin because of risk of bleeding - He was started on Keppra for possible seizures. Per neurology, changed to Depakote today. EEG showed no seizure but still pt clinical picture with unresponsiveness and then agitated after few hours make it more suspicious for seizure /  post ictal state  - Appreciate PCT input   Hospital Course:  Escherichia coli UTI. - Continue Rocephin   Chronic systolic and diastolic CHF, NYHA class 3/ Ischemic cardiomyopathy - 2-D echo 4/24 showed EF of 30-35%. PA pressure 64.  Chronic atrial fibrillation, complete heart block - [place] VASC 6 - Holding aspirin because of risk of bleeding   DVT prophylaxis: SCDs bilaterally due to risk of bleeding Code Status: full code  Family Communication: Spoke with the patient's wife over the phone Disposition Plan: Appreciate palliative care addressing goals of care   Consultants:   Palliative care  Neurology  Procedures:   EEG 4/29 - No seizure activity noted.  Antimicrobials:   Rocephin 12/12/2015    Subjective: Agitated and restless yesterday afternoon requiring vest restraint. Now calm, no distress.   Objective: Filed Vitals:   12/12/15 2000 12/12/15 2200 12/13/15 0035 12/13/15 0400  BP: 105/61 97/59 106/61 109/68  Pulse: 91 92 90 90  Temp:   98.5 F (36.9 C) 98.6 F (37 C)  TempSrc:   Oral Oral  Resp: 17 27 18 22   Height:      Weight:      SpO2: 100% 100% 100% 100%    Intake/Output Summary (Last 24 hours) at 12/13/15 0649 Last data filed at 12/13/15 0036  Gross per 24 hour  Intake    245 ml  Output    725 ml  Net   -480 ml   Filed Weights   12/12/15 0137  Weight: 50.122 kg (110 lb 8 oz)    Examination:  General exam: Appears calm Respiratory system: Bilateral air entry, no wheezing  Cardiovascular system:  S1 & S2 appreciated, Rate controlled.  Gastrointestinal system: (+) BS, non tender abdomen Central nervous system: nonfocal  Extremities: no swelling, no tenderness  Skin: warm, dry  Psychiatry: no agitation or restlessness   Data Reviewed: I have personally reviewed following labs and imaging studies  CBC:  Recent Labs Lab 12/08/15 0425 12/09/15 0453 12/10/15 0308 12/11/15 0224 12/11/15 2219  WBC 21.8* 19.9* 20.2* 16.4* 17.4*    NEUTROABS  --   --   --   --  14.4*  HGB 8.0* 8.3* 8.6* 8.5* 8.8*  HCT 23.3* 24.1* 25.7* 25.7* 26.5*  MCV 86.0 88.3 89.2 90.5 92.7  PLT 106* 130* 192 199 [id number]   Basic Metabolic Panel:  Recent Labs Lab 12/08/15 0913 12/09/15 0453 12/10/15 0308 12/11/15 0224 12/11/15 2219  NA 129* 128* 133* 135 133*  K 5.0 5.0 4.7 4.3 4.5  CL 104 100* 101 100* 99*  CO2 19* 18* 21* 23 24  GLUCOSE 98 153* 203* 160* 115*  BUN 54* 60* 64* 57* 61*  CREATININE 1.36* 1.74* 1.53* 1.46* 1.50*  CALCIUM 7.7* 7.7* 7.8* 8.0* 7.8*   GFR: Estimated Creatinine Clearance: 24.1 mL/min (by C-G formula based on Cr of 1.5). Liver Function Tests: No results for input(s): AST, ALT, ALKPHOS, BILITOT, PROT, ALBUMIN in the last 168 hours. No results for input(s): LIPASE, AMYLASE in the last 168 hours.  Recent Labs Lab 12/12/15 0632  AMMONIA 23   Coagulation Profile:  Recent Labs Lab 12/07/15 0529 12/08/15 0425 12/09/15 0453 12/10/15 0308 12/11/15 0224  INR 1.79* 1.86* 1.65* 1.66* 1.47   Cardiac Enzymes:  Recent Labs Lab 12/11/15 2219 12/12/15 0631  TROPONINI 0.19* 0.19*   BNP (last 3 results) No results for input(s): PROBNP in the last 8760 hours. HbA1C: No results for input(s): HGBA1C in the last 72 hours. CBG:  Recent Labs Lab 12/12/15 1207 12/12/15 1511 12/12/15 1950 12/13/15 0033 12/13/15 0435  GLUCAP 95 114* 132* 95 109*   Lipid Profile: No results for input(s): CHOL, HDL, LDLCALC, TRIG, CHOLHDL, LDLDIRECT in the last 72 hours. Thyroid Function Tests: No results for input(s): TSH, T4TOTAL, FREET4, T3FREE, THYROIDAB in the last 72 hours. Anemia Panel:  Recent Labs  12/12/15 0631  VITAMINB12 1210*   Urine analysis:    Component Value Date/Time   COLORURINE YELLOW 12/05/2015 0055   APPEARANCEUR CLOUDY* 12/05/2015 0055   LABSPEC 1.012 12/05/2015 0055   PHURINE 6.0 12/05/2015 0055   GLUCOSEU NEGATIVE 12/05/2015 0055   HGBUR MODERATE* 12/05/2015 0055   BILIRUBINUR NEGATIVE  12/05/2015 0055   KETONESUR NEGATIVE 12/05/2015 0055   PROTEINUR NEGATIVE 12/05/2015 0055   UROBILINOGEN 0.2 06/28/2014 1401   NITRITE NEGATIVE 12/05/2015 0055   LEUKOCYTESUR LARGE* 12/05/2015 0055   Sepsis Labs: @LABRCNTIP (procalcitonin:4,lacticidven:4)  Recent Results (from the past 240 hour(s))  Urine culture     Status: Abnormal   Collection Time: 12/05/15 12:55 AM  Result Value Ref Range Status   Specimen Description URINE, CATHETERIZED  Final   Special Requests NONE  Final   Culture >=100,000 COLONIES/mL ESCHERICHIA COLI (A)  Final   Report Status 12/07/2015 FINAL  Final   Organism ID, Bacteria ESCHERICHIA COLI (A)  Final      Susceptibility   Escherichia coli - MIC*    AMPICILLIN >=32 RESISTANT Resistant     CEFAZOLIN <=4 SENSITIVE Sensitive     CEFTRIAXONE <=1 SENSITIVE Sensitive     CIPROFLOXACIN <=0.25 SENSITIVE Sensitive     GENTAMICIN <=1 SENSITIVE Sensitive     IMIPENEM <=0.25 SENSITIVE  Sensitive     NITROFURANTOIN <=16 SENSITIVE Sensitive     TRIMETH/SULFA <=20 SENSITIVE Sensitive     AMPICILLIN/SULBACTAM >=32 RESISTANT Resistant     PIP/TAZO <=4 SENSITIVE Sensitive     * >=100,000 COLONIES/mL ESCHERICHIA COLI  MRSA PCR Screening     Status: None   Collection Time: 12/05/15  5:45 AM  Result Value Ref Range Status   MRSA by PCR NEGATIVE NEGATIVE Final    Comment:        The GeneXpert MRSA Assay (FDA approved for NASAL specimens only), is one component of a comprehensive MRSA colonization surveillance program. It is not intended to diagnose MRSA infection nor to guide or monitor treatment for MRSA infections.       Radiology Studies: Ct Angio Head W/cm &/or Wo Cm 12/13/2015  CT HEAD: No acute intracranial process. No abnormal intracranial enhancement. 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 Otherwise negative CT HEAD for age. CTA NECK: Occluded LEFT internal carotid artery from the origin to the  intracranial segments, likely chronic. Moderate stenosis bilateral vertebral artery origins which are otherwise widely patent. CTA HEAD: Retrograde flow to LEFT carotid terminus with complete circle of Willis. No emergent large vessel occlusion or high-grade stenosis. Mild luminal irregularity of the intracranial vessels compatible with atherosclerosis. Electronically Signed   By: Elon Alas M.D.   On: 12/13/2015 02:17   Ct Angio Neck W/cm &/or Wo/cm 12/13/2015   CT HEAD: No acute intracranial process. No abnormal intracranial enhancement. 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 Otherwise negative CT HEAD for age. 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 Moderate stenosis bilateral vertebral artery origins which are otherwise widely patent. CTA HEAD: Retrograde flow to LEFT carotid terminus with complete circle of Willis. No emergent large vessel occlusion or high-grade stenosis. Mild luminal irregularity of the intracranial vessels compatible with atherosclerosis.   Ct Head Wo Contrast 12/11/2015   1. Stable since prior.  No acute intracranial finding. 2. Nondisplaced left facial fractures as described previously. Electronically Signed   By: Monte Fantasia M.D.   On: 12/11/2015 23:33   Dg Chest Port 1 View 12/11/2015  1. Right pleural effusion with probable subsegmental atelectasis in the right lower lobe. 2. Mild cardiomegaly. 3. Atherosclerosis. Electronically Signed   By: Vinnie Langton M.D.   On: 12/11/2015 22:43     Scheduled Meds: . antiseptic oral rinse  7 mL Mouth Rinse BID  . cefTRIAXone (ROCEPHIN)  IV  1 g Intravenous Q24H  . insulin aspart  0-9 Units Subcutaneous Q4H  . levETIRAcetam  500 mg Intravenous Q12H   Continuous Infusions:    LOS: 1 day    Time spent: 25 minutes    Leisa Lenz, MD [facility] Pager [phone]  If 7PM-7AM,  please contact night-coverage [email or web address] Password [facility] 12/13/2015, 6:49 AM

## 2015-12-14 ENCOUNTER — Inpatient Hospital Stay (HOSPITAL_COMMUNITY): Payer: Medicare Other | Admitting: Physical Therapy

## 2015-12-14 ENCOUNTER — Inpatient Hospital Stay (HOSPITAL_COMMUNITY): Payer: Medicare Other

## 2015-12-14 DIAGNOSIS — R404 Transient alteration of awareness: Secondary | ICD-10-CM

## 2015-12-14 LAB — COMPREHENSIVE METABOLIC PANEL
ALK PHOS: 90 U/L (ref 38–126)
ALT: 40 U/L (ref 17–63)
ANION GAP: 10 (ref 5–15)
AST: 36 U/L (ref 15–41)
Albumin: 2.4 g/dL — ABNORMAL LOW (ref 3.5–5.0)
BUN: 37 mg/dL — ABNORMAL HIGH (ref 6–20)
CALCIUM: 7.6 mg/dL — AB (ref 8.9–10.3)
CO2: 21 mmol/L — AB (ref 22–32)
CREATININE: 1.41 mg/dL — AB (ref 0.61–1.24)
Chloride: 102 mmol/L (ref 101–111)
GFR, EST AFRICAN AMERICAN: 50 mL/min — AB (ref >60)
GFR, EST NON AFRICAN AMERICAN: 43 mL/min — AB (ref >60)
Glucose, Bld: 71 mg/dL (ref 65–99)
Potassium: 4.8 mmol/L (ref 3.5–5.1)
SODIUM: 133 mmol/L — AB (ref 135–145)
TOTAL PROTEIN: 4.4 g/dL — AB (ref 6.5–8.1)
Total Bilirubin: 0.5 mg/dL (ref 0.3–1.2)

## 2015-12-14 LAB — GLUCOSE, CAPILLARY
GLUCOSE-CAPILLARY: 78 mg/dL (ref 65–99)
GLUCOSE-CAPILLARY: 171 mg/dL — AB (ref 65–99)
GLUCOSE-CAPILLARY: 135 mg/dL — AB (ref 65–99)
Glucose-Capillary: 104 mg/dL — ABNORMAL HIGH (ref 65–99)
Glucose-Capillary: 164 mg/dL — ABNORMAL HIGH (ref 65–99)

## 2015-12-14 LAB — FOLATE RBC
FOLATE, HEMOLYSATE: 596.1 ng/mL
Folate, RBC: 2241 ng/mL (ref >498)
Hematocrit: 26.6 % — ABNORMAL LOW (ref 37.5–51.0)

## 2015-12-14 LAB — MAGNESIUM: Magnesium: 2 mg/dL (ref 1.7–2.4)

## 2015-12-14 LAB — PROCALCITONIN: PROCALCITONIN: 0.11 ng/mL

## 2015-12-14 LAB — CBC
HCT: 28.9 % — ABNORMAL LOW (ref 39.0–52.0)
Hemoglobin: 9.3 g/dL — ABNORMAL LOW (ref 13.0–17.0)
MCH: 30.7 pg (ref 26.0–34.0)
MCHC: 32.2 g/dL (ref 30.0–36.0)
MCV: 95.4 fL (ref 78.0–100.0)
PLATELETS: 206 10*3/uL (ref 150–400)
RBC: 3.03 MIL/uL — ABNORMAL LOW (ref 4.22–5.81)
RDW: 19.3 % — AB (ref 11.5–15.5)
WBC: 9.4 10*3/uL (ref 4.0–10.5)

## 2015-12-14 LAB — DIGOXIN LEVEL: DIGOXIN LVL: 0.5 ng/mL — AB (ref 0.8–2.0)

## 2015-12-14 LAB — VITAMIN B1: Vitamin B1 (Thiamine): 222 nmol/L — ABNORMAL HIGH (ref 66.5–200.0)

## 2015-12-14 LAB — HEMOGLOBIN A1C
HEMOGLOBIN A1C: 5.7 % — AB (ref 4.8–5.6)
Mean Plasma Glucose: 117 mg/dL

## 2015-12-14 LAB — VALPROIC ACID LEVEL: Valproic Acid Lvl: 82 ug/mL (ref 50.0–100.0)

## 2015-12-14 MED ORDER — PRAVASTATIN SODIUM 20 MG PO TABS
20.0000 mg | ORAL_TABLET | Freq: Every day | ORAL | Status: DC
  Administered 2015-12-14: 20 mg via ORAL
  Filled 2015-12-14: qty 1

## 2015-12-14 MED ORDER — ARTIFICIAL TEARS OP OINT
TOPICAL_OINTMENT | Freq: Every day | OPHTHALMIC | Status: DC
  Administered 2015-12-14: 23:00:00 via OPHTHALMIC
  Filled 2015-12-14: qty 3.5

## 2015-12-14 MED ORDER — DIGOXIN 125 MCG PO TABS
0.0625 mg | ORAL_TABLET | Freq: Every day | ORAL | Status: DC
  Administered 2015-12-14 – 2015-12-15 (×2): 0.0625 mg via ORAL
  Filled 2015-12-14 (×3): qty 1

## 2015-12-14 MED ORDER — ALLOPURINOL 300 MG PO TABS
300.0000 mg | ORAL_TABLET | Freq: Every day | ORAL | Status: DC
  Administered 2015-12-14 – 2015-12-15 (×2): 300 mg via ORAL
  Filled 2015-12-14 (×3): qty 1

## 2015-12-14 MED ORDER — OXYCODONE HCL 5 MG PO TABS: 5.0000 mg | ORAL_TABLET | ORAL | Status: DC | PRN

## 2015-12-14 MED ORDER — ACETAMINOPHEN 325 MG PO TABS
650.0000 mg | ORAL_TABLET | Freq: Four times a day (QID) | ORAL | Status: DC | PRN
  Administered 2015-12-14 (×2): 650 mg via ORAL
  Filled 2015-12-14 (×3): qty 2

## 2015-12-14 MED ORDER — ACETAMINOPHEN 500 MG PO TABS
1000.0000 mg | ORAL_TABLET | Freq: Four times a day (QID) | ORAL | Status: DC
  Administered 2015-12-14 – 2015-12-15 (×3): 1000 mg via ORAL
  Filled 2015-12-14 (×3): qty 2

## 2015-12-14 NOTE — Progress Notes (Addendum)
Patient ID: SOUL COMPANION, male   DOB: Nov 03, 1924, 82 y.o.   MRN: [id number]  PROGRESS NOTE    [name]  [id number] DOB: [birth year] DOA: 12/12/2015  PCP: Roberto Lopes, MD  Outpatient Specialists: Cardiology, Dr. Crissie Sickles  Brief Narrative:   82 y.o. male with medical history significant for atrial fibrillation on Coumadin, CAD status post CABG, chronic kidney disease stage III, chronic systolic CHF, depression and anxiety, just discharged to inpatient rehabilitation 12/11/2015 after his hospitalization for GI bleed. He had tagged red blood cell study and sigmoidoscopy with identification of a bleeding rectal ulcer. He has received total of 8 units PRBC during that hospital stay. Patient transferred from rehabilitation to the telemetry floor 12/12/2015 after he was found to be unresponsive. Patient was unable to speak and the only thing he was able to do his blink eyes. Unable to do MRI because patient has pacemaker. The CT scan did not show acute intracranial findings. Neurology saw the patient in consultation and recommended starting Depakote.   Assessment & Plan:   Acute metabolic encephalopathy / possible stroke  - Unclear etiology. 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 Moderate stenosis bilateral vertebral artery origins which are otherwise widely patent - Unable to do MRI because patient has pacemaker - Coumadin was stopped during recent hospitalization due to risk of bleeding. We stopped aspirin and subcutaneous heparin because of risk of bleeding - He was started on Keppra for possible seizures.  - Per neurology, changed to Depakote 4/30 due to suspicion of seizure considering his presentation on admission. EEG did not demonstrated seizure  activity.  - Palliative care consulted for goals of care  Hospital Course:  Escherichia coli UTI. - Continue Rocephin   Chronic systolic and diastolic CHF, NYHA class 3/ Ischemic cardiomyopathy - 2-D echo 4/24 showed EF of 30-35%. PA pressure 64.  Chronic atrial fibrillation, complete heart block - [place] VASC 6 - Holding aspirin because of risk of bleeding   DVT prophylaxis: SCDs bilaterally due to risk of bleeding Code Status: full code  Family Communication: Spoke with the patient's wife over the phone Disposition Plan: Needs PT eval for safe discharge plan and possible return to rehab by 5/2   Consultants:   Palliative care  Neurology  Procedures:   EEG 4/29 - No seizure activity noted.  Antimicrobials:   Rocephin 12/12/2015 -->   Subjective: No overnight events, calm.  Objective: Filed Vitals:   12/13/15 2354 12/13/15 2359 12/14/15 0405 12/14/15 0409  BP: 109/68  113/68   Pulse: 90  87   Temp:  97.6 F (36.4 C)  98 F (36.7 C)  TempSrc:  Oral  Oral  Resp: 21  15   Height:      Weight:      SpO2: 100%  100%     Intake/Output Summary (Last 24 hours) at 12/14/15 0638 Last data filed at 12/14/15 0409  Gross per 24 hour  Intake    600 ml  Output    550 ml  Net     50 ml   Filed Weights   12/12/15 0137 12/13/15 0748  Weight: 50.122 kg (110 lb 8 oz) 50 kg (110 lb 3.7 oz)    Examination:  General exam: Appears calm, comfortable  Respiratory system:  No wheezing, no rhonchi Cardiovascular system: S1 & S2 heard, rate is controlled  Gastrointestinal system: non tender abdomen, non distended, appreciate bowel sounds  Central nervous system: more alert, no focal deficits  Extremities: palpable pulses, non tender Skin: warm skin, scattered ecchymoses   Psychiatry: normal mood, not restless   Data Reviewed: I have personally reviewed following labs and imaging studies  CBC:  Recent Labs Lab 12/08/15 0425 12/09/15 0453 12/10/15 0308  12/11/15 0224 12/11/15 2219  WBC 21.8* 19.9* 20.2* 16.4* 17.4*  NEUTROABS  --   --   --   --  14.4*  HGB 8.0* 8.3* 8.6* 8.5* 8.8*  HCT 23.3* 24.1* 25.7* 25.7* 26.5*  MCV 86.0 88.3 89.2 90.5 92.7  PLT 106* 130* 192 199 [id number]   Basic Metabolic Panel:  Recent Labs Lab 12/08/15 0913 12/09/15 0453 12/10/15 0308 12/11/15 0224 12/11/15 2219  NA 129* 128* 133* 135 133*  K 5.0 5.0 4.7 4.3 4.5  CL 104 100* 101 100* 99*  CO2 19* 18* 21* 23 24  GLUCOSE 98 153* 203* 160* 115*  BUN 54* 60* 64* 57* 61*  CREATININE 1.36* 1.74* 1.53* 1.46* 1.50*  CALCIUM 7.7* 7.7* 7.8* 8.0* 7.8*   GFR: Estimated Creatinine Clearance: 24.1 mL/min (by C-G formula based on Cr of 1.5). Liver Function Tests: No results for input(s): AST, ALT, ALKPHOS, BILITOT, PROT, ALBUMIN in the last 168 hours. No results for input(s): LIPASE, AMYLASE in the last 168 hours.  Recent Labs Lab 12/12/15 0632  AMMONIA 23   Coagulation Profile:  Recent Labs Lab 12/08/15 0425 12/09/15 0453 12/10/15 0308 12/11/15 0224  INR 1.86* 1.65* 1.66* 1.47   Cardiac Enzymes:  Recent Labs Lab 12/11/15 2219 12/12/15 0631  TROPONINI 0.19* 0.19*   BNP (last 3 results) No results for input(s): PROBNP in the last 8760 hours. HbA1C: No results for input(s): HGBA1C in the last 72 hours. CBG:  Recent Labs Lab 12/13/15 1103 12/13/15 1548 12/13/15 1912 12/13/15 2354 12/14/15 0406  GLUCAP 108* 182* 142* 163* 78   Lipid Profile: No results for input(s): CHOL, HDL, LDLCALC, TRIG, CHOLHDL, LDLDIRECT in the last 72 hours. Thyroid Function Tests: No results for input(s): TSH, T4TOTAL, FREET4, T3FREE, THYROIDAB in the last 72 hours. Anemia Panel:  Recent Labs  12/12/15 0631  VITAMINB12 1210*   Urine analysis:    Component Value Date/Time   COLORURINE YELLOW 12/05/2015 0055   APPEARANCEUR CLOUDY* 12/05/2015 0055   LABSPEC 1.012 12/05/2015 0055   PHURINE 6.0 12/05/2015 0055   GLUCOSEU NEGATIVE 12/05/2015 0055   HGBUR  MODERATE* 12/05/2015 0055   BILIRUBINUR NEGATIVE 12/05/2015 0055   KETONESUR NEGATIVE 12/05/2015 0055   PROTEINUR NEGATIVE 12/05/2015 0055   UROBILINOGEN 0.2 06/28/2014 1401   NITRITE NEGATIVE 12/05/2015 0055   LEUKOCYTESUR LARGE* 12/05/2015 0055   Sepsis Labs: @LABRCNTIP (procalcitonin:4,lacticidven:4)  Recent Results (from the past 240 hour(s))  Urine culture     Status: Abnormal   Collection Time: 12/05/15 12:55 AM  Result Value Ref Range Status   Specimen Description URINE, CATHETERIZED  Final   Special Requests NONE  Final   Culture >=100,000 COLONIES/mL ESCHERICHIA COLI (A)  Final   Report Status 12/07/2015 FINAL  Final   Organism ID, Bacteria ESCHERICHIA COLI (A)  Final      Susceptibility   Escherichia coli - MIC*    AMPICILLIN >=32 RESISTANT Resistant     CEFAZOLIN <=4 SENSITIVE Sensitive     CEFTRIAXONE <=1 SENSITIVE Sensitive     CIPROFLOXACIN <=0.25 SENSITIVE Sensitive  GENTAMICIN <=1 SENSITIVE Sensitive     IMIPENEM <=0.25 SENSITIVE Sensitive     NITROFURANTOIN <=16 SENSITIVE Sensitive     TRIMETH/SULFA <=20 SENSITIVE Sensitive     AMPICILLIN/SULBACTAM >=32 RESISTANT Resistant     PIP/TAZO <=4 SENSITIVE Sensitive     * >=100,000 COLONIES/mL ESCHERICHIA COLI  MRSA PCR Screening     Status: None   Collection Time: 12/05/15  5:45 AM  Result Value Ref Range Status   MRSA by PCR NEGATIVE NEGATIVE Final    Comment:        The GeneXpert MRSA Assay (FDA approved for NASAL specimens only), is one component of a comprehensive MRSA colonization surveillance program. It is not intended to diagnose MRSA infection nor to guide or monitor treatment for MRSA infections.   Culture, blood (routine x 2)     Status: None (Preliminary result)   Collection Time: 12/12/15 12:25 AM  Result Value Ref Range Status   Specimen Description BLOOD RIGHT HAND  Final   Special Requests IN PEDIATRIC BOTTLE 1CC  Final   Culture NO GROWTH 1 DAY  Final   Report Status PENDING   Incomplete  Culture, blood (routine x 2)     Status: None (Preliminary result)   Collection Time: 12/12/15 12:40 AM  Result Value Ref Range Status   Specimen Description BLOOD LEFT HAND  Final   Special Requests IN PEDIATRIC BOTTLE 1CC  Final   Culture NO GROWTH 1 DAY  Final   Report Status PENDING  Incomplete      Radiology Studies: Ct Angio Head W/cm &/or Wo Cm 12/13/2015  CT HEAD: No acute intracranial process. No abnormal intracranial enhancement. 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 Otherwise negative CT HEAD for age. 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 Moderate stenosis bilateral vertebral artery origins which are otherwise widely patent. CTA HEAD: Retrograde flow to LEFT carotid terminus with complete circle of Willis. No emergent large vessel occlusion or high-grade stenosis. Mild luminal irregularity of the intracranial vessels compatible with atherosclerosis. Electronically Signed   By: Elon Alas M.D.   On: 12/13/2015 02:17   Ct Angio Neck W/cm &/or Wo/cm 12/13/2015   CT HEAD: No acute intracranial process. No abnormal intracranial enhancement. 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 Otherwise negative CT HEAD for age. 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 Moderate stenosis bilateral vertebral artery origins which are otherwise widely patent. CTA HEAD: Retrograde flow to LEFT carotid terminus with complete circle of Willis. No emergent large vessel occlusion or high-grade stenosis. Mild luminal irregularity of the intracranial vessels compatible with atherosclerosis.   Ct Head Wo Contrast 12/11/2015   1. Stable since prior.  No acute intracranial finding. 2. Nondisplaced left facial fractures as described previously.  Electronically Signed   By: Monte Fantasia M.D.   On: 12/11/2015 23:33   Dg Chest Port 1 View 12/11/2015  1. Right pleural effusion with probable subsegmental atelectasis in the right lower lobe. 2. Mild cardiomegaly. 3. Atherosclerosis. Electronically Signed   By: Vinnie Langton M.D.   On: 12/11/2015 22:43     Scheduled Meds: . antiseptic oral rinse  7 mL Mouth Rinse BID  . cefTRIAXone (ROCEPHIN)  IV  1 g Intravenous Q24H  . divalproex  750 mg Oral Q12H  . insulin aspart  0-9 Units Subcutaneous Q4H   Continuous Infusions:  LOS: 2 days    Time spent: 15 minutes    Leisa Lenz, MD [facility] Pager [phone]  If 7PM-7AM, please contact night-coverage [email or web address] Password [facility] 12/14/2015, 6:38 AM

## 2015-12-14 NOTE — Progress Notes (Signed)
Speech Language Pathology Treatment: Dysphagia  Patient Details Name: [name] MRN: [id number] DOB: 10-23-1924 Today's Date: 12/14/2015 Time: [id number] SLP Time Calculation (min) (ACUTE ONLY): 21 min  Assessment / Plan / Recommendation Clinical Impression  Pt seen during breakfast meal to further assess diet tolerance and increase use of recommended strategies. SLP reviewed recommendations from evaluation yesterday and chopped food into bite-sized pieces prior to starting meal. Pt self-fed trials with pacing much improved from yesterday and only Min cues needed for single sips. Pt had a wet vocal quality x2, with one time clearing spontaneously with delayed throat clear and the other time clearing with a cued throat clear. Recommend to continue with current diet textures.   HPI HPI: 82 y.o. male with medical history significant for atrial fibrillation on Coumadin, CAD status post CABG, chronic kidney disease stage III, chronic systolic CHF, depression and anxiety, just discharged to inpatient rehabilitation 12/11/2015 after his hospitalization for GI bleed. Patient transferred from rehabilitation to the telemetry floor 12/12/2015 after he was found to be unresponsive.      SLP Plan  Continue with current plan of care     Recommendations  Diet recommendations: Dysphagia 3 (mechanical soft);Nectar-thick liquid Liquids provided via: Cup;No straw Medication Administration: Whole meds with puree Supervision: Patient able to self feed;Full supervision/cueing for compensatory strategies Compensations: Minimize environmental distractions;Slow rate;Small sips/bites;Follow solids with liquid Postural Changes and/or Swallow Maneuvers: Seated upright 90 degrees;Upright 30-60 min after meal             Oral Care Recommendations: Oral care BID Follow up Recommendations: Inpatient Rehab Plan: Continue with current plan of care     GO               Germain Osgood, M.A.  CCC-SLP [phone]  Germain Osgood 12/14/2015, 11:06 AM

## 2015-12-14 NOTE — Consult Note (Signed)
Consultation Note Date: 12/14/2015   Patient Name: [name]  DOB: Sep 09, 1924  MRN: [id number]  Age / Sex: 82 y.o., male  PCP: Leanna Battles, MD Referring Physician: Robbie Lis, MD  Reason for Consultation: Establishing goals of care, Non pain symptom management, Pain control and Psychosocial/spiritual support  HPI/Patient Profile: 82 y.o. male  with past medical history of Afib, Bladder Cancer, AVB, CABG, Lower GIB with Rectal ulcer, failure to thrive and decline at home admitted on 12/12/2015 with altered mental status, readmitted from acute inpatient rehab. Palliative consulted for goals of care and symptom management, he had a fall prior to admission and complains of pain in his left shoulder.  Clinical Assessment and Goals of Care:  I met with patient and his wife [name] to discuss goals of care and I have reviewed his chart in detail. [name] has multiple questions about his care, his medications and his current condition. Mr, Bachmeier looks extremely frail and weak, he is able to participate in the conversation but it is clear he is not fully oriented and struggling to follow the conversation.   In obtaining history, Mr. Komatsu has been having signs of vascular dementia at home for months- wife: "ive been worried". He has not been sleeping and is agitated in the evenings, he gets paranoid and also confused about events and has poor short term memory. He has compensated by sticking to a daily routine for years that he was able to recite to me from memory.This puts him at high risk for acute and subacute delirium  1. AMS/Delirium/Neurological Changes/subclinical seizure evaluation  1. Hypocalcemia with correction - unknown etiology, check ionized to confirm 2. Hypomagnesemia- repeat lab along with a CMET to check albumin 3. Digoxin is a home med - check dig level with AMS and above issues, K is ok even with  aggressive diuresis but hypocalcemia and hypomag can cause toxicity issues in the elderly 4. UTI- results from 4/22, will repeat and add culture--he has a history of bladder cancer and cannot tell me what that surveillance has been-this may need to be evaluated given symptoms- he has unilateral right sided peripheral edema with ?DVT unprovoked which is unusual and suspicious for malignancy. 5. Noted starting of Depakote, use caution with Digoxin and watch for toxicity- consider pharmacy monitoring 6. Taking benzodiazapines long term, ?withdrawl when inpatient if they were stopped could have led to seizure like activity-resume these for sleep and anxiety. He would be a good candidate for mirtazapine for sleep and for appetite stimulation but will try to avoid poly pharmacy and see how depakote works for him.  2. Shoulder/ Fall pain: Pain may also be driving delirium and confusion. Pain causes delirium in most cases not the meds for pain. I will schedule tylenol and use oxycodone for breakthrough pain. I do not see any imaging of his left shoulder which is where he says he has pain-may need fracture evaluation?  3. On Anticoagulation with rectal ulcer bleed- will need to address anticoagulation in goals of care  eventually.   I shared with patient in wife from a holistic perspective how serious things are for him right now- they are going to discuss goals together and I will meet them tomorrow.  HCPOA    SUMMARY OF RECOMMENDATIONS   As Above.  1. Work-up AMS/Delirium - multiple medical problems but no cohesive picture on his condition from the history or data which makes prognostication very difficult- continue comprehensive step-wise work up. Nursing delirium precautions.    Code Status/Advance Care Planning: Full code - [place] DNR, will confirm tomorrow   Symptom Management:   Scheduled Tylenol, Oxycodone for breakthrough  Insomnia-use IV ativan  Palliative Prophylaxis:     Aspiration  Additional Recommendations (Limitations, Scope, Preferences):  Full Scope Treatment  Psycho-social/Spiritual:   Desire for further Chaplaincy support:yes  Additional Recommendations: Caregiving  Support/Resources  Prognosis:   < 6 months  Discharge Planning: To Be Determined      Primary Diagnoses: Present on Admission:  . Depressed level of consciousness . Chronic atrial fibrillation ([place]) . Acute cystitis without hematuria . Depression . CKD (chronic kidney disease), stage III . Chronic systolic CHF (congestive heart failure) ([place]) . Coronary artery disease  I have reviewed the medical record, interviewed the patient and family, and examined the patient. The following aspects are pertinent.  Past Medical History  Diagnosis Date  . Atrial fibrillation (HCC)     Chronic  . Coronary artery disease 2004    Status post CABG with LIMA-LAD, SVG-CFX. Hx of anterior apical infarct.  . Renal insufficiency     Chronic  . Hypertension   . Hyperlipidemia   . Gout   . CHF (congestive heart failure) (HCC)     with chronic ischemic cardiomypathy. EF of 10-15%.  CHF due to systolic dysfunction. Clinically doing well.  [name] GERD (gastroesophageal reflux disease)   . Pacemaker     Medtronic biventricular pacemaker, serial number [id number]  . Type II diabetes mellitus ([place])     "been off my RX for ~ 2 yr" (03/20/2013)  . H/O hiatal hernia   . Arthritis     "right thumb" (03/20/2013)  . Depression   . Anxiety   . Bladder cancer ([place])     "low grade; grade 1; non-invasive" (03/20/2013)  . CKD (chronic kidney disease), stage III    Social History   Social History  . Marital Status: Married    Spouse Name: N/A  . Number of Children: 1  . Years of Education: N/A   Occupational History  . Retired    Social History Main Topics  . Smoking status: Former Smoker -- 1.00 packs/day for 17 years    Types: Cigarettes    Quit date: 02/28/1960  . Smokeless tobacco:  Never Used  . Alcohol Use: Yes     Comment: 03/20/2013 "been about 2-3 months since I've had alcohol; never been a heavy drinker; was 1-2 drinks before dinner probably 5 nights/wk"  . Drug Use: No  . Sexual Activity: Not Currently   Other Topics Concern  . None   Social History Narrative   Lives with wife.   Family History  Problem Relation Age of Onset  . Heart attack Mother    Scheduled Meds: . acetaminophen  1,000 mg Oral QID  . allopurinol  300 mg Oral Daily  . antiseptic oral rinse  7 mL Mouth Rinse BID  . artificial tears   Both Eyes QHS  . cefTRIAXone (ROCEPHIN)  IV  1 g Intravenous  Q24H  . digoxin  0.0625 mg Oral Daily  . divalproex  750 mg Oral Q12H  . insulin aspart  0-9 Units Subcutaneous Q4H  . pravastatin  20 mg Oral q1800   Continuous Infusions:  PRN Meds:.acetaminophen, haloperidol lactate, ipratropium, LORazepam, RESOURCE THICKENUP CLEAR Medications Prior to Admission:  Prior to Admission medications   Medication Sig Start Date End Date Taking? Authorizing Provider  allopurinol (ZYLOPRIM) 300 MG tablet Take 300 mg by mouth daily.      Historical Provider, MD  ALPRAZolam Duanne Moron) 0.5 MG tablet Take 1 mg by mouth at bedtime.  04/07/11   Historical Provider, MD  calcium-vitamin D (OSCAL WITH D) 500-200 MG-UNIT per tablet Take 1 tablet by mouth daily.     Historical Provider, MD  digoxin (LANOXIN) 0.125 MG tablet Take 0.0625 mg by mouth daily.     Historical Provider, MD  furosemide (LASIX) 40 MG tablet Take 80 mg by mouth daily. 02/09/15   Historical Provider, MD  HYDROcodone-acetaminophen (NORCO/VICODIN) 5-325 MG per tablet Take 0.5 tablets by mouth at bedtime.  10/08/12   Historical Provider, MD  ipratropium (ATROVENT) 0.03 % nasal spray Place 1-2 sprays into both nostrils 3 (three) times daily as needed for rhinitis (congestion).  11/19/15   Historical Provider, MD  lovastatin (MEVACOR) 20 MG tablet Take 20 mg by mouth at bedtime. Reported on 11/24/2015    Historical  Provider, MD  megestrol (MEGACE) 40 MG/ML suspension Take 400 mg by mouth 2 (two) times daily. 10 mls - every morning and at 3pm    Historical Provider, MD  Melatonin 10 MG TABS Take 10 mg by mouth at bedtime.    Historical Provider, MD  multivitamin-lutein [facility]) CAPS Take 1 capsule by mouth daily.    Historical Provider, MD  Naphazoline HCl (CLEAR EYES OP) Place 1 drop into both eyes daily as needed (irritation/ eye drop).    Historical Provider, MD  Triamcinolone Acetonide (NASACORT AQ NA) Place 1 spray into both nostrils daily.    Historical Provider, MD  vitamin B-12 (CYANOCOBALAMIN) 1000 MCG tablet Take 1,000 mcg by mouth daily.    Historical Provider, MD   Allergies  Allergen Reactions  . Penicillins Hives and Itching    Has patient had a PCN reaction causing immediate rash, facial/tongue/throat swelling, SOB or lightheadedness with hypotension: YES Has patient had a PCN reaction causing severe rash involving mucus membranes or skin necrosis: NO Has patient had a PCN reaction that required hospitalizationNO Has patient had a PCN reaction occurring within the last 10 years: NO If all of the above answers are "NO", then may proceed with Cephalosporin use.   Review of Systems  Physical Exam  Vital Signs: BP 104/60 mmHg  Pulse 89  Temp(Src) 97.7 F (36.5 C) (Oral)  Resp 21  Ht 5' 10"  (1.778 m)  Wt 50 kg (110 lb 3.7 oz)  BMI 15.82 kg/m2  SpO2 100% Pain Assessment: No/denies pain POSS *See Group Information*: 1-Acceptable,Awake and alert Pain Score: 0-No pain   SpO2: SpO2: 100 % O2 Device:SpO2: 100 % O2 Flow Rate: .   IO: Intake/output summary:  Intake/Output Summary (Last 24 hours) at 12/14/15 2057 Last data filed at 12/14/15 1909  Gross per 24 hour  Intake      0 ml  Output    750 ml  Net   -750 ml    LBM: Last BM Date: 12/09/15 Baseline Weight: Weight: 50.122 kg (110 lb 8 oz) (bedscale) Most recent weight: Weight: 50 kg (110 lb  3.7 oz)     Palliative  Assessment/Data:   Flowsheet Rows        Most Recent Value   Intake Tab    Referral Department  Hospitalist   Unit at Time of Referral  Cardiac/Telemetry Unit   Palliative Care Primary Diagnosis  Neurology   Date Notified  12/12/15   Palliative Care Type  New Palliative care   Reason for referral  Clarify Goals of Care   Date of Admission  12/12/15   Date first seen by Palliative Care  12/14/15   # of days Palliative referral response time  2 Day(s)   # of days IP prior to Palliative referral  0   Clinical Assessment    Psychosocial & Spiritual Assessment    Palliative Care Outcomes       Time In: 1PM Time Out: 210PM Time Total: 70 min Greater than 50%  of this time was spent counseling and coordinating care related to the above assessment and plan.  Signed by: Lane Hacker, DO   Please contact Palliative Medicine Team phone at [phone] for questions and concerns.  For individual provider: See Shea Evans

## 2015-12-14 NOTE — Progress Notes (Signed)
Rehab Admissions Coordinator Note:  Patient was screened by Retta Diones for appropriateness for an Inpatient Acute Rehab Consult.  Patient known to me after recent rehab admission last Friday.  I will follow up tomorrow and progress and potential plans for inpatient rehab admission.  Call me for questions.  Jodell Cipro M 12/14/2015, 4:11 PM  I can be reached at [phone].

## 2015-12-14 NOTE — Evaluation (Signed)
Physical Therapy Evaluation Patient Details Name: [name] MRN: [id number] DOB: [birth year] Today's Date: 12/14/2015   History of Present Illness  82 y.o. male admitted to [facility] on 12/12/15 from our inpatient rehab unit where he was being treated after GIB for decreased level of consciousness.  Dx with acute metabolic encepholopathty/possible stroke, and E-coli UTI.  Pt with significant PMHx of A-fib, CAD, renal insufficiency, HTN, CHF, pacemaker, DM 2, bladder CA, CKD, and CABG.  Clinical Impression  Pt is preforming well despite the events of the last few days. He was able to stand and walk a short distance into the hallway with RW and min assist.  He is generally weak and debilitated and would still be appropriate for inpatient rehab at discharge.  PT to follow acutely for deficits listed below.       Follow Up Recommendations CIR    Equipment Recommendations  Rolling walker with 5" wheels    Recommendations for Other Services OT consult     Precautions / Restrictions Precautions Precautions: Fall Precaution Comments: Pt is unsteady on his feet.       Mobility  Bed Mobility Overal bed mobility: Needs Assistance Bed Mobility: Supine to Sit     Supine to sit: Min assist;HOB elevated     General bed mobility comments: Min assist to support trunk and help progresss legs over EOB with HOB elevated.  Pt using bed rail with cues and manual assist to find bedrail from therapist.   Transfers Overall transfer level: Needs assistance Equipment used: Rolling walker (2 wheeled) Transfers: Sit to/from Stand Sit to Stand: Min assist         General transfer comment: Min assist to support trunk during transition to stand over weak legs.   Ambulation/Gait Ambulation/Gait assistance: Min assist Ambulation Distance (Feet): 50 Feet Assistive device: Rolling walker (2 wheeled) Gait Pattern/deviations: Step-through pattern;Shuffle;Trunk flexed     General Gait Details: Pt with slow,  shuffling gait speed, flexed over RW.           Balance Overall balance assessment: Needs assistance Sitting-balance support: Feet supported;Bilateral upper extremity supported Sitting balance-Leahy Scale: Fair     Standing balance support: Bilateral upper extremity supported Standing balance-Leahy Scale: Poor                               Pertinent Vitals/Pain Pain Assessment: Faces Faces Pain Scale: Hurts little more Pain Location: right shoulder Pain Descriptors / Indicators: Aching;Burning Pain Intervention(s): Limited activity within patient's tolerance;Monitored during session;Repositioned    Home Living Family/patient expects to be discharged to:: Private residence Living Arrangements: Spouse/significant other Available Help at Discharge: Family;Available 24 hours/day (wife is 20 years yonger than pt) Type of Home: House Home Access: Stairs to enter Entrance Stairs-Rails: Left Entrance Stairs-Number of Steps: 3 Home Layout: One level Home Equipment: Cane - single point;Shower seat - built in Additional Comments: Pt was independent PTA and still drove    Prior Function Level of Independence: Independent         Comments: still drives, had dog, "Mickey"        Extremity/Trunk Assessment   Upper Extremity Assessment: Generalized weakness           Lower Extremity Assessment: Generalized weakness      Cervical / Trunk Assessment: Kyphotic  Communication   Communication: No difficulties  Cognition Arousal/Alertness: Awake/alert Behavior During Therapy: WFL for tasks assessed/performed Overall Cognitive Status: Impaired/Different from baseline Area  of Impairment: Memory     Memory: Decreased short-term memory (pt having difficult time with remembering events of past few)                       Assessment/Plan    PT Assessment Patient needs continued PT services  PT Diagnosis Difficulty walking;Abnormality of  gait;Generalized weakness;Acute pain;Altered mental status   PT Problem List Decreased strength;Decreased activity tolerance;Decreased balance;Decreased mobility;Decreased knowledge of use of DME;Decreased cognition;Pain  PT Treatment Interventions DME instruction;Gait training;Stair training;Functional mobility training;Therapeutic activities;Therapeutic exercise;Balance training;Neuromuscular re-education;Patient/family education;Modalities   PT Goals (Current goals can be found in the Care Plan section) Acute Rehab PT Goals Patient Stated Goal: to go back to rehab before he goes home PT Goal Formulation: With patient Time For Goal Achievement: 12/28/15 Potential to Achieve Goals: Good    Frequency Min 3X/week           End of Session Equipment Utilized During Treatment: Gait belt Activity Tolerance: Patient limited by fatigue Patient left: in chair;with call bell/phone within reach;with family/visitor present Nurse Communication: Mobility status         Time: [id number] PT Time Calculation (min) (ACUTE ONLY): 22 min   Charges:   PT Evaluation $PT Eval Moderate Complexity: 1 Procedure          Kingslee Mairena B. Marceline, [place], DPT [phone]   12/14/2015, 3:01 PM

## 2015-12-14 NOTE — Discharge Summary (Signed)
NAMESTPHEN, KRUSZYNSKI NO.:  [id number]  MEDICAL RECORD NO.:  [id number]  LOCATION:  3S16C                        FACILITY:  [facility]  PHYSICIAN:  [name], M.D.DATE OF BIRTH:  1924/11/27  DATE OF ADMISSION:  12/11/2015 DATE OF DISCHARGE:12/12/2015                              DISCHARGE SUMMARY   DISCHARGE DIAGNOSES: 1. Debilitation after gastrointestinal bleed. 2. Superficial thrombus of right basilic vein, left cephalic vein. 3. Pain management. 4. Coronary artery disease with coronary artery bypass graft. 5. Atrial fibrillation. 6. Hypertension. 7. Diastolic congestive heart failure. 8. Diet-controlled diabetes mellitus. 9. Escherichia coli. 10.Urinary tract infection. 11.Gout. 12.Hyperlipidemia.  HISTORY OF PRESENT ILLNESS:  This is an 82 year old right-handed male with history of atrial fibrillation, maintained on chronic Coumadin as well as history of coronary artery disease with pacemaker.  He lives with his wife in [place], [place], independent prior to admission with generalized decline over the past few months.  Recent fall, hit the corner of his eye on the sink at home.  Presented on December 05, 2015, with bright red blood from the rectum as well as recent fall from the toilet 1 week prior.  Bouts of constipation.  Hemoglobin on admission of 6.5.  INR 4.86.  EKG showed ST changes and T-wave inversions.  Troponin 0.20.  He did receive vitamin K, fresh frozen plasma.  Gastroenterology Services consulted.  Findings of a single solitary ulcer in the rectum and distal rectum.  He underwent epinephrine injection and cauterization.  Close monitoring of hemoglobin.  He had received packed red blood cells x8.  Coumadin was placed on hold.  Vascular study of bilateral upper extremity showed superficial thrombus of left cephalic vein, placed on subcutaneous heparin.  Plan was to resume Coumadin approximately 2 weeks. Echocardiogram with  ejection fraction of 35%.  He did receive followup by Cardiology Services for acute on chronic congestive heart failure. Maintained on Lasix.  Creatinine 1.89 to 2.43.  Placed on Rocephin for Escherichia coli urinary tract infection.  Physical and occupational therapy ongoing.  The patient was admitted for comprehensive rehab program on December 09, 2015.  PAST MEDICAL HISTORY:  See discharge diagnoses.  SOCIAL HISTORY:  Lives with spouse, independent prior to admission. Generalized decline over the past 2 months.  FUNCTIONAL STATUS UPON ADMISSION TO REHAB SERVICES:  Minimal assist 25 feet holding IV pole, minimal assist, stand pivot transfers, min to mod assist activities of daily living.  PHYSICAL EXAMINATION:  VITAL SIGNS:  Blood pressure 128/73, pulse 89, temperature 98, respirations 18. GENERAL:  This is an alert, frail right-handed male, extensive bruising to the left side of the face. CARDIAC:  Rate, irregularly irregular. LUNGS:  Clear to auscultation without wheeze. ABDOMEN:  Soft, nontender.  Good bowel sounds.  No rebound or tenderness.  REHABILITATION HOSPITAL COURSE:  The patient was admitted to Inpatient Rehab Services with therapies initiated on a 3-hour daily basis, consisting of physical therapy, occupational therapy, and rehabilitation nursing.  The following issues were addressed during the patient's rehabilitation stay.  Pertaining to Mr. Houser therapy slow to progress due to multiple medical issues.  On December 12, 2015, noted altered mental status.  Question syncope.  CT of the head showed no acute changes. Chemistry showed a creatinine 1.50, sodium 133.  Due to altered mental status changes, the patient was discharged to acute care services on December 12, 2015, for ongoing monitoring and medical evaluations.  CONDITION AT DISCHARGE:  Guarded.  All medication changes were made as per [facility].     Lauraine Rinne,  P.A.   ______________________________ [name], M.D.    DA/MEDQ  D:  12/14/2015  T:  12/14/2015  Job:  [id number]

## 2015-12-14 NOTE — Discharge Summary (Deleted)
[name], [name] NO.:  [id number]  MEDICAL RECORD NO.:  [id number]  LOCATION:  3S16C                        FACILITY:  [facility]  PHYSICIAN:  [name], M.D.DATE OF BIRTH:  31-Oct-1924  DATE OF ADMISSION:  12/11/2015 DATE OF DISCHARGE:12/12/2015                              DISCHARGE SUMMARY   DISCHARGE DIAGNOSES: 1. Debilitation after gastrointestinal bleed. 2. Superficial thrombus of right basilic vein, left cephalic vein. 3. Pain management. 4. Coronary artery disease with coronary artery bypass graft. 5. Atrial fibrillation. 6. Hypertension. 7. Diastolic congestive heart failure. 8. Diet-controlled diabetes mellitus. 9. Escherichia coli. 10.Urinary tract infection. 11.Gout. 12.Hyperlipidemia.  HISTORY OF PRESENT ILLNESS:  This is an 82 year old right-handed male with history of atrial fibrillation, maintained on chronic Coumadin as well as history of coronary artery disease with pacemaker.  He lives with his wife in [place], [place], independent prior to admission with generalized decline over the past few months.  Recent fall, hit the corner of his eye on the sink at home.  Presented on December 05, 2015, with bright red blood from the rectum as well as recent fall from the toilet 1 week prior.  Bouts of constipation.  Hemoglobin on admission of 6.5.  INR 4.86.  EKG showed ST changes and T-wave inversions.  Troponin 0.20.  He did receive vitamin K, fresh frozen plasma.  Gastroenterology Services consulted.  Findings of a single solitary ulcer in the rectum and distal rectum.  He underwent epinephrine injection and cauterization.  Close monitoring of hemoglobin.  He had received packed red blood cells x8.  Coumadin was placed on hold.  Vascular study of bilateral upper extremity showed superficial thrombus of left cephalic vein, placed on subcutaneous heparin.  Plan was to resume Coumadin approximately 2 weeks. Echocardiogram with  ejection fraction of 35%.  He did receive followup by Cardiology Services for acute on chronic congestive heart failure. Maintained on Lasix.  Creatinine 1.89 to 2.43.  Placed on Rocephin for Escherichia coli urinary tract infection.  Physical and occupational therapy ongoing.  The patient was admitted for comprehensive rehab program on December 09, 2015.  PAST MEDICAL HISTORY:  See discharge diagnoses.  SOCIAL HISTORY:  Lives with spouse, independent prior to admission. Generalized decline over the past 2 months.  FUNCTIONAL STATUS UPON ADMISSION TO REHAB SERVICES:  Minimal assist 25 feet holding IV pole, minimal assist, stand pivot transfers, min to mod assist activities of daily living.  PHYSICAL EXAMINATION:  VITAL SIGNS:  Blood pressure 128/73, pulse 89, temperature 98, respirations 18. GENERAL:  This is an alert, frail right-handed male, extensive bruising to the left side of the face. CARDIAC:  Rate, irregularly irregular. LUNGS:  Clear to auscultation without wheeze. ABDOMEN:  Soft, nontender.  Good bowel sounds.  No rebound or tenderness.  REHABILITATION HOSPITAL COURSE:  The patient was admitted to Inpatient Rehab Services with therapies initiated on a 3-hour daily basis, consisting of physical therapy, occupational therapy, and rehabilitation nursing.  The following issues were addressed during the patient's rehabilitation stay.  Pertaining to Mr. Buehler therapy slow to progress due to multiple medical issues.  On December 12, 2015, noted altered mental status.  Question syncope.  CT of the head showed no acute changes. Chemistry showed a creatinine 1.50, sodium 133.  Due to altered mental status changes, the patient was discharged to acute care services on December 12, 2015, for ongoing monitoring and medical evaluations.  CONDITION AT DISCHARGE:  Guarded.  All medication changes were made as per [facility].     Lauraine Rinne,  P.A.   ______________________________ [name], M.D.    DA/MEDQ  D:  12/14/2015  T:  12/14/2015  Job:  [id number]

## 2015-12-15 ENCOUNTER — Inpatient Hospital Stay (HOSPITAL_COMMUNITY)
Admission: RE | Admit: 2015-12-15 | Discharge: 2015-12-24 | DRG: 947 | Disposition: A | Discharge status: Home Health | Payer: Medicare Other | Source: Intra-hospital | Attending: Physical Medicine & Rehabilitation | Admitting: Physical Medicine & Rehabilitation

## 2015-12-15 ENCOUNTER — Encounter (HOSPITAL_COMMUNITY): Payer: Self-pay | Admitting: *Deleted

## 2015-12-15 ENCOUNTER — Encounter: Payer: Self-pay | Admitting: Internal Medicine

## 2015-12-15 DIAGNOSIS — I5043 Acute on chronic combined systolic (congestive) and diastolic (congestive) heart failure: Secondary | ICD-10-CM | POA: Diagnosis present

## 2015-12-15 DIAGNOSIS — G9340 Encephalopathy, unspecified: Secondary | ICD-10-CM | POA: Diagnosis not present

## 2015-12-15 DIAGNOSIS — Z950 Presence of cardiac pacemaker: Secondary | ICD-10-CM

## 2015-12-15 DIAGNOSIS — R609 Edema, unspecified: Secondary | ICD-10-CM | POA: Diagnosis not present

## 2015-12-15 DIAGNOSIS — I130 Hypertensive heart and chronic kidney disease with heart failure and stage 1 through stage 4 chronic kidney disease, or unspecified chronic kidney disease: Secondary | ICD-10-CM

## 2015-12-15 DIAGNOSIS — D72829 Elevated white blood cell count, unspecified: Secondary | ICD-10-CM | POA: Insufficient documentation

## 2015-12-15 DIAGNOSIS — M79672 Pain in left foot: Secondary | ICD-10-CM

## 2015-12-15 DIAGNOSIS — R627 Adult failure to thrive: Secondary | ICD-10-CM | POA: Diagnosis not present

## 2015-12-15 DIAGNOSIS — E875 Hyperkalemia: Secondary | ICD-10-CM

## 2015-12-15 DIAGNOSIS — I82A11 Acute embolism and thrombosis of right axillary vein: Secondary | ICD-10-CM | POA: Diagnosis not present

## 2015-12-15 DIAGNOSIS — Z951 Presence of aortocoronary bypass graft: Secondary | ICD-10-CM | POA: Diagnosis not present

## 2015-12-15 DIAGNOSIS — Z8551 Personal history of malignant neoplasm of bladder: Secondary | ICD-10-CM

## 2015-12-15 DIAGNOSIS — R5381 Other malaise: Principal | ICD-10-CM

## 2015-12-15 DIAGNOSIS — F419 Anxiety disorder, unspecified: Secondary | ICD-10-CM

## 2015-12-15 DIAGNOSIS — N183 Chronic kidney disease, stage 3 unspecified: Secondary | ICD-10-CM | POA: Diagnosis present

## 2015-12-15 DIAGNOSIS — I998 Other disorder of circulatory system: Secondary | ICD-10-CM | POA: Insufficient documentation

## 2015-12-15 DIAGNOSIS — K5900 Constipation, unspecified: Secondary | ICD-10-CM

## 2015-12-15 DIAGNOSIS — R7303 Prediabetes: Secondary | ICD-10-CM

## 2015-12-15 DIAGNOSIS — I959 Hypotension, unspecified: Secondary | ICD-10-CM

## 2015-12-15 DIAGNOSIS — I82611 Acute embolism and thrombosis of superficial veins of right upper extremity: Secondary | ICD-10-CM | POA: Diagnosis not present

## 2015-12-15 DIAGNOSIS — D62 Acute posthemorrhagic anemia: Secondary | ICD-10-CM | POA: Insufficient documentation

## 2015-12-15 DIAGNOSIS — Z8719 Personal history of other diseases of the digestive system: Secondary | ICD-10-CM | POA: Insufficient documentation

## 2015-12-15 DIAGNOSIS — D631 Anemia in chronic kidney disease: Secondary | ICD-10-CM

## 2015-12-15 DIAGNOSIS — Z87891 Personal history of nicotine dependence: Secondary | ICD-10-CM

## 2015-12-15 DIAGNOSIS — N390 Urinary tract infection, site not specified: Secondary | ICD-10-CM

## 2015-12-15 DIAGNOSIS — N189 Chronic kidney disease, unspecified: Secondary | ICD-10-CM

## 2015-12-15 DIAGNOSIS — E785 Hyperlipidemia, unspecified: Secondary | ICD-10-CM | POA: Diagnosis not present

## 2015-12-15 DIAGNOSIS — I255 Ischemic cardiomyopathy: Secondary | ICD-10-CM | POA: Diagnosis not present

## 2015-12-15 DIAGNOSIS — R52 Pain, unspecified: Secondary | ICD-10-CM

## 2015-12-15 DIAGNOSIS — I480 Paroxysmal atrial fibrillation: Secondary | ICD-10-CM | POA: Insufficient documentation

## 2015-12-15 DIAGNOSIS — B9620 Unspecified Escherichia coli [E. coli] as the cause of diseases classified elsewhere: Secondary | ICD-10-CM | POA: Diagnosis not present

## 2015-12-15 DIAGNOSIS — M109 Gout, unspecified: Secondary | ICD-10-CM

## 2015-12-15 DIAGNOSIS — Z66 Do not resuscitate: Secondary | ICD-10-CM | POA: Diagnosis not present

## 2015-12-15 DIAGNOSIS — Z79891 Long term (current) use of opiate analgesic: Secondary | ICD-10-CM | POA: Diagnosis not present

## 2015-12-15 DIAGNOSIS — I2510 Atherosclerotic heart disease of native coronary artery without angina pectoris: Secondary | ICD-10-CM

## 2015-12-15 DIAGNOSIS — D638 Anemia in other chronic diseases classified elsewhere: Secondary | ICD-10-CM

## 2015-12-15 DIAGNOSIS — Z79899 Other long term (current) drug therapy: Secondary | ICD-10-CM | POA: Diagnosis not present

## 2015-12-15 DIAGNOSIS — I999 Unspecified disorder of circulatory system: Secondary | ICD-10-CM | POA: Diagnosis not present

## 2015-12-15 DIAGNOSIS — R0989 Other specified symptoms and signs involving the circulatory and respiratory systems: Secondary | ICD-10-CM | POA: Diagnosis not present

## 2015-12-15 DIAGNOSIS — I5042 Chronic combined systolic (congestive) and diastolic (congestive) heart failure: Secondary | ICD-10-CM

## 2015-12-15 DIAGNOSIS — R230 Cyanosis: Secondary | ICD-10-CM

## 2015-12-15 DIAGNOSIS — M79673 Pain in unspecified foot: Secondary | ICD-10-CM | POA: Insufficient documentation

## 2015-12-15 DIAGNOSIS — I82612 Acute embolism and thrombosis of superficial veins of left upper extremity: Secondary | ICD-10-CM

## 2015-12-15 DIAGNOSIS — R1310 Dysphagia, unspecified: Secondary | ICD-10-CM

## 2015-12-15 LAB — URINALYSIS, ROUTINE W REFLEX MICROSCOPIC
BILIRUBIN URINE: NEGATIVE
Glucose, UA: NEGATIVE mg/dL
HGB URINE DIPSTICK: NEGATIVE
KETONES UR: NEGATIVE mg/dL
Leukocytes, UA: NEGATIVE
NITRITE: NEGATIVE
Protein, ur: NEGATIVE mg/dL
Specific Gravity, Urine: 1.017 (ref 1.005–1.030)
pH: 6.5 (ref 5.0–8.0)

## 2015-12-15 LAB — GLUCOSE, CAPILLARY
GLUCOSE-CAPILLARY: 114 mg/dL — AB (ref 65–99)
GLUCOSE-CAPILLARY: 162 mg/dL — AB (ref 65–99)
GLUCOSE-CAPILLARY: 203 mg/dL — AB (ref 65–99)
GLUCOSE-CAPILLARY: 224 mg/dL — AB (ref 65–99)
GLUCOSE-CAPILLARY: 96 mg/dL (ref 65–99)
Glucose-Capillary: 94 mg/dL (ref 65–99)

## 2015-12-15 MED ORDER — RESOURCE THICKENUP CLEAR PO POWD: 1.0000 | ORAL | Status: DC | PRN

## 2015-12-15 MED ORDER — HALOPERIDOL LACTATE 5 MG/ML IJ SOLN: 2.0000 mg | Freq: Four times a day (QID) | INTRAMUSCULAR | Status: DC | PRN

## 2015-12-15 MED ORDER — DIVALPROEX SODIUM 250 MG PO DR TAB: 750.0000 mg | DELAYED_RELEASE_TABLET | Freq: Two times a day (BID) | ORAL | Status: DC

## 2015-12-15 MED ORDER — ACETAMINOPHEN 325 MG PO TABS: 650.0000 mg | ORAL_TABLET | Freq: Four times a day (QID) | ORAL | Status: AC | PRN

## 2015-12-15 MED ORDER — ALPRAZOLAM 0.5 MG PO TABS: 1.0000 mg | ORAL_TABLET | Freq: Every evening | ORAL | Status: DC | PRN

## 2015-12-15 MED ORDER — SORBITOL 70 % SOLN
30.0000 mL | Freq: Every day | Status: DC | PRN
  Administered 2015-12-23: 30 mL via ORAL
  Filled 2015-12-15: qty 30

## 2015-12-15 MED ORDER — DEXTROSE 5 % IV SOLN: 1.0000 g | INTRAVENOUS | Status: DC

## 2015-12-15 MED ORDER — DEXTROSE 5 % IV SOLN
1.0000 g | INTRAVENOUS | Status: DC
  Filled 2015-12-15: qty 10

## 2015-12-15 MED ORDER — ONDANSETRON HCL 4 MG/2ML IJ SOLN: 4.0000 mg | Freq: Four times a day (QID) | INTRAMUSCULAR | Status: DC | PRN

## 2015-12-15 MED ORDER — ARTIFICIAL TEARS OP OINT
TOPICAL_OINTMENT | Freq: Every evening | OPHTHALMIC | Status: DC | PRN
  Administered 2015-12-15: 1 via OPHTHALMIC
  Filled 2015-12-15: qty 3.5

## 2015-12-15 MED ORDER — ONDANSETRON HCL 4 MG PO TABS: 4.0000 mg | ORAL_TABLET | Freq: Four times a day (QID) | ORAL | Status: DC | PRN

## 2015-12-15 MED ORDER — PRAVASTATIN SODIUM 20 MG PO TABS
20.0000 mg | ORAL_TABLET | Freq: Every day | ORAL | Status: DC
  Administered 2015-12-15 – 2015-12-23 (×9): 20 mg via ORAL
  Filled 2015-12-15 (×9): qty 1

## 2015-12-15 MED ORDER — DIGOXIN 125 MCG PO TABS
0.0625 mg | ORAL_TABLET | Freq: Every day | ORAL | Status: DC
  Administered 2015-12-16 – 2015-12-24 (×9): 0.0625 mg via ORAL
  Filled 2015-12-15 (×9): qty 1

## 2015-12-15 MED ORDER — DIVALPROEX SODIUM 250 MG PO DR TAB
750.0000 mg | DELAYED_RELEASE_TABLET | Freq: Two times a day (BID) | ORAL | Status: DC
  Administered 2015-12-15 – 2015-12-19 (×8): 750 mg via ORAL
  Filled 2015-12-15 (×9): qty 3

## 2015-12-15 MED ORDER — ALLOPURINOL 100 MG PO TABS
300.0000 mg | ORAL_TABLET | Freq: Every day | ORAL | Status: DC
  Administered 2015-12-16 – 2015-12-24 (×9): 300 mg via ORAL
  Filled 2015-12-15 (×9): qty 3

## 2015-12-15 MED ORDER — ACETAMINOPHEN 325 MG PO TABS
650.0000 mg | ORAL_TABLET | Freq: Four times a day (QID) | ORAL | Status: DC | PRN
  Administered 2015-12-15 – 2015-12-21 (×2): 650 mg via ORAL
  Filled 2015-12-15 (×2): qty 2

## 2015-12-15 MED ORDER — RESOURCE THICKENUP CLEAR PO POWD
ORAL | Status: DC | PRN
  Filled 2015-12-15: qty 125

## 2015-12-15 MED ORDER — ACETAMINOPHEN 500 MG PO TABS: 1000.0000 mg | ORAL_TABLET | Freq: Four times a day (QID) | ORAL | Status: DC

## 2015-12-15 MED ORDER — OXYCODONE HCL 5 MG PO TABS
5.0000 mg | ORAL_TABLET | ORAL | Status: DC | PRN
  Administered 2015-12-16 – 2015-12-24 (×8): 5 mg via ORAL
  Filled 2015-12-15 (×8): qty 1

## 2015-12-15 NOTE — Progress Notes (Signed)
[name], MD Physician Signed Physical Medicine and Rehabilitation Consult Note 12/09/2015 6:30 AM  Related encounter: ED to Hosp-Admission (Discharged) from 12/04/2015 in [facility]    Expand All Collapse All        Physical Medicine and Rehabilitation Consult Reason for Consult: Debilitation/GI bleed Referring Physician: [facility]   HPI: [name] is a 82 y.o. right handed male with history of atrial fibrillation maintained on chronic Coumadin, coronary artery disease status post CABG, systolic and diastolic congestive heart failure, type 2 diabetes mellitus, bladder cancer. Lives with wife in [place]. Reports to be independent prior to admission with a general decline over the past few months. Presented 12/05/2015 with bright red blood from the rectum as well as a recent fall from the toilet approximately 1 week ago. Patient reports some recent constipation 2 weeks ago but denied any nausea vomiting or abdominal pain. Patient with ongoing active bleeding in the ED. Hemoglobin 6.5. INR 4.86. EKG showed new ST changes and T-wave inversions in the anterior leads. Troponin 0.20. Patient did receive vitamin K fresh frozen plasma 2 units and 1 unit of packed red blood cells. Gastroenterology services consulted Dr. Carlean Purl with findings of a single solitary ulcer in the rectum and in the distal rectum. Underwent epi injection and then cauterization. Close monitoring of hemoglobin status post PRBCs 8 with latest hemoglobin 8.3. Patient's chronic Coumadin currently remains on hold. He has been placed on subcutaneous heparin for DVT prophylaxis. Follow-up cardiology services with echocardiogram showing ejection fraction of 35%. Akinesis of the anterior inferior lateral and apical myocardium. Placed on intravenous Lasix for acute on chronic systolic and diastolic heart failure. Close monitoring of creatinine 1.89-2.43. Tolerating a regular diet. Urine  culture Escherichia coli presently maintained on Rocephin. Physical therapy evaluation completed 12/08/2015 for debilitation with recommendations of physical medicine rehabilitation consult.  Patient fell prior to admission and hit the corner of his left eye on the sink Patient also states that he has had lower extremity swelling as well as right greater than left upper extremity swelling even at home prior to admission.  Review of Systems  Constitutional: Negative for fever and chills.  HENT: Negative for hearing loss.  Eyes: Negative for blurred vision and double vision.  Respiratory: Positive for shortness of breath. Negative for cough.  Cardiovascular: Positive for palpitations and leg swelling. Negative for chest pain.  Gastrointestinal: Positive for constipation. Negative for nausea and vomiting.  Genitourinary: Negative for dysuria and hematuria.  Musculoskeletal: Positive for myalgias, joint pain and falls. Negative for back pain.  Skin: Negative for rash.  Neurological: Positive for weakness. Negative for seizures and headaches.  All other systems reviewed and are negative.  Past Medical History  Diagnosis Date  . Atrial fibrillation (HCC)     Chronic  . Coronary artery disease 2004    Status post CABG with LIMA-LAD, SVG-CFX. Hx of anterior apical infarct.  . Renal insufficiency     Chronic  . Hypertension   . Hyperlipidemia   . Gout   . CHF (congestive heart failure) (HCC)     with chronic ischemic cardiomypathy. EF of 10-15%. CHF due to systolic dysfunction. Clinically doing well.  [name] GERD (gastroesophageal reflux disease)   . Pacemaker     Medtronic biventricular pacemaker, serial number [id number]  . Type II diabetes mellitus ([place])     "been off my RX for ~ 2 yr" (03/20/2013)  . H/O hiatal hernia   . Arthritis     "  right thumb" (03/20/2013)  . Depression   . Anxiety   . Bladder cancer ([place])      "low grade; grade 1; non-invasive" (03/20/2013)  . CKD (chronic kidney disease), stage III    Past Surgical History  Procedure Laterality Date  . Transurethral resection of prostate  1990's; 2004    "twice; injected chemo 1st time; didn't do that 2nd time" (03/20/2013)  . Nasal septum surgery    . US echocardiography  04-17-2009    Est EF 10-15%  . Cardiovascular stress test  04-17-2009    EF 29%  . Coronary artery bypass graft  02/19/2003    LIMA-LAD, SVG-CFX  . Carotid endarterectomy Right 1990's    "left side is still 100% blocked" (03/20/2013)  . Icd lead removal Left 03/01/2013    Procedure: ICD LEAD REMOVAL; Surgeon: Evans Lance, MD; Location: [facility]; Service: Cardiovascular; Laterality: Left;  . Pacemaker lead removal Left 03/01/2013    Procedure: PACEMAKER LEAD REMOVAL; Surgeon: Evans Lance, MD; Location: [facility]; Service: Cardiovascular; Laterality: Left;  . Pacemaker insertion N/A 03/01/2013    Procedure: INSERTION PACEMAKER LEAD; Surgeon: Evans Lance, MD; Location: [facility]; Service: Cardiovascular; Laterality: N/A;  . Hemorrhoid surgery      "lanced several years ago" (03/20/2013)  . Tonsillectomy and adenoidectomy  1933  . Cataract extraction w/ intraocular lens implant, bilateral Bilateral ~ 2000  . Bi-ventricular pacemaker insertion N/A 03/20/2013    Procedure: BI-VENTRICULAR PACEMAKER INSERTION (CRT-P); Surgeon: Evans Lance, MD; Medtronic biventricular pacemaker, serial number [id number]; Laterality: Right   Family History  Problem Relation Age of Onset  . Heart attack Mother    Social History:  reports that he quit smoking about 55 years ago. His smoking use included Cigarettes. He has a 17 pack-year smoking history. He has never used smokeless tobacco. He reports that he drinks alcohol. He reports that he does not use illicit drugs. Allergies:  Allergies    Allergen Reactions  . Penicillins Hives and Itching    Has patient had a PCN reaction causing immediate rash, facial/tongue/throat swelling, SOB or lightheadedness with hypotension: YES Has patient had a PCN reaction causing severe rash involving mucus membranes or skin necrosis: NO Has patient had a PCN reaction that required hospitalizationNO Has patient had a PCN reaction occurring within the last 10 years: NO If all of the above answers are "NO", then may proceed with Cephalosporin use.   Medications Prior to Admission  Medication Sig Dispense Refill  . allopurinol (ZYLOPRIM) 300 MG tablet Take 300 mg by mouth daily.     [name] ALPRAZolam (XANAX) 0.5 MG tablet Take 1 mg by mouth at bedtime.     . calcium-vitamin D (OSCAL WITH D) 500-200 MG-UNIT per tablet Take 1 tablet by mouth daily.     . digoxin (LANOXIN) 0.125 MG tablet Take 0.0625 mg by mouth daily.     . furosemide (LASIX) 40 MG tablet Take 80 mg by mouth daily.    [name] HYDROcodone-acetaminophen (NORCO/VICODIN) 5-325 MG per tablet Take 0.5 tablets by mouth at bedtime.     [name] ipratropium (ATROVENT) 0.03 % nasal spray Place 1-2 sprays into both nostrils 3 (three) times daily as needed for rhinitis (congestion).   4  . lovastatin (MEVACOR) 20 MG tablet Take 20 mg by mouth at bedtime. Reported on 11/24/2015    . megestrol (MEGACE) 40 MG/ML suspension Take 400 mg by mouth 2 (two) times daily. 10 mls - every morning and at 3pm    .  Melatonin 10 MG TABS Take 10 mg by mouth at bedtime.    . multivitamin-lutein (OCUVITE-LUTEIN) CAPS Take 1 capsule by mouth daily.    . Naphazoline HCl (CLEAR EYES OP) Place 1 drop into both eyes daily as needed (irritation/ eye drop).    [name] spironolactone (ALDACTONE) 25 MG tablet Take 25 mg by mouth daily.    . Triamcinolone Acetonide (NASACORT AQ NA) Place 1 spray into both nostrils daily.    . vitamin B-12 (CYANOCOBALAMIN) 1000 MCG  tablet Take 1,000 mcg by mouth daily.    [name] warfarin (COUMADIN) 2 MG tablet Take 1-2 mg by mouth at bedtime. Take 1/2 tablet (1 mg) by mouth Monday, Wednesday, Friday, take 1 tablet (2 mg) on Sunday, Tuesday, Thursday, Saturday      Home: Home Living Family/patient expects to be discharged to:: Private residence Living Arrangements: Spouse/significant other Available Help at Discharge: Family, Available 24 hours/day ("my wife is 87 yrs younger than me") Type of Home: House Home Equipment: None Additional Comments: Pt provides different answers to same question so home setup unknown  Functional History: Prior Function Level of Independence: Independent Comments: per pt Functional Status:  Mobility: Bed Mobility Overal bed mobility: Needs Assistance Bed Mobility: Sidelying to Sit, Sit to Supine Sidelying to sit: Min guard Sit to supine: Supervision General bed mobility comments: minguard due to incr effort and reports of dizziness; after returned to bed, pt felt he needed to have BM and assisted to [facility] Transfers Overall transfer level: Needs assistance Equipment used: None Transfers: Sit to/from Stand, Stand Pivot Transfers Sit to Stand: Min guard Stand pivot transfers: Min assist General transfer comment: pt with difficulty sequencing and aligning himself with [facility] Ambulation/Gait Ambulation/Gait assistance: Min assist Ambulation Distance (Feet): 25 Feet Assistive device: (holding IV pole) Gait Pattern/deviations: Step-through pattern, Decreased stride length General Gait Details: steady Gait velocity interpretation: Below normal speed for age/gender    ADL:    Cognition: Cognition Overall Cognitive Status: Impaired/Different from baseline Orientation Level: Oriented X4 Cognition Arousal/Alertness: Awake/alert Behavior During Therapy: WFL for tasks assessed/performed Overall Cognitive Status: Impaired/Different from baseline Area of Impairment: Memory,  Following commands, Safety/judgement, Problem solving Memory: Decreased short-term memory Following Commands: Follows one step commands with increased time Safety/Judgement: Decreased awareness of safety, Decreased awareness of deficits Problem Solving: Slow processing, Decreased initiation, Requires verbal cues, Requires tactile cues  Blood pressure 128/73, pulse 89, temperature 98.5 F (36.9 C), temperature source Oral, resp. rate 18, height 5\' 8"  (1.727 m), weight 54.522 kg (120 lb 3.2 oz), SpO2 99 %. Physical Exam  Constitutional: He is oriented to person, place, and time.  82 year old frail right-handed male  HENT:  Bruising to the left side of the face.  Eyes: EOM are normal.  Neck: Normal range of motion. Neck supple. No thyromegaly present.  Cardiovascular:  Cardiac rate control  Respiratory: Effort normal and breath sounds normal.  GI: Soft. Bowel sounds are normal. He exhibits no distension.  Neurological: He is alert and oriented to person, place, and time.  Skin: Skin is warm and dry.  Motor strength is 4 minus in the right deltoid, biceps, triceps, grip 4/5 in the left deltoid, biceps, triceps, grip 4+ in bilateral hip flexor and knee extensor and ankle dorsiflexor He has no evidence of edema in the lower limbs 2-3+ edema right upper limb with extensive ecchymosis 1+ edema left upper limb with extensive ecchymosis   Lab Results Last 24 Hours    Results for orders placed or performed during the  hospital encounter of 12/04/15 (from the past 24 hour(s))  Basic metabolic panel Status: Abnormal   Collection Time: 12/08/15 9:13 AM  Result Value Ref Range   Sodium 129 (L) 135 - 145 mmol/L   Potassium 5.0 3.5 - 5.1 mmol/L   Chloride 104 101 - 111 mmol/L   CO2 19 (L) 22 - 32 mmol/L   Glucose, Bld 98 65 - 99 mg/dL   BUN 54 (H) 6 - 20 mg/dL   Creatinine, Ser 1.36 (H) 0.61 - 1.24 mg/dL   Calcium 7.7 (L) 8.9 - 10.3 mg/dL   GFR  calc non Af Amer 45 (L) >60 mL/min   GFR calc Af Amer 52 (L) >60 mL/min   Anion gap 6 5 - 15  Hemoglobin A1c Status: Abnormal   Collection Time: 12/08/15 9:17 AM  Result Value Ref Range   Hgb A1c MFr Bld 5.9 (H) 4.8 - 5.6 %   Mean Plasma Glucose 123 mg/dL  Glucose, capillary Status: Abnormal   Collection Time: 12/08/15 4:30 PM  Result Value Ref Range   Glucose-Capillary 151 (H) 65 - 99 mg/dL  Protime-INR Status: Abnormal   Collection Time: 12/09/15 4:53 AM  Result Value Ref Range   Prothrombin Time 19.5 (H) 11.6 - 15.2 seconds   INR 1.65 (H) 0.00 - 1.49  CBC Status: Abnormal   Collection Time: 12/09/15 4:53 AM  Result Value Ref Range   WBC 19.9 (H) 4.0 - 10.5 K/uL   RBC 2.73 (L) 4.22 - 5.81 MIL/uL   Hemoglobin 8.3 (L) 13.0 - 17.0 g/dL   HCT 24.1 (L) 39.0 - 52.0 %   MCV 88.3 78.0 - 100.0 fL   MCH 30.4 26.0 - 34.0 pg   MCHC 34.4 30.0 - 36.0 g/dL   RDW 15.1 11.5 - 15.5 %   Platelets 130 (L) 150 - 400 K/uL  Basic metabolic panel Status: Abnormal   Collection Time: 12/09/15 4:53 AM  Result Value Ref Range   Sodium 128 (L) 135 - 145 mmol/L   Potassium 5.0 3.5 - 5.1 mmol/L   Chloride 100 (L) 101 - 111 mmol/L   CO2 18 (L) 22 - 32 mmol/L   Glucose, Bld 153 (H) 65 - 99 mg/dL   BUN 60 (H) 6 - 20 mg/dL   Creatinine, Ser 1.74 (H) 0.61 - 1.24 mg/dL   Calcium 7.7 (L) 8.9 - 10.3 mg/dL   GFR calc non Af Amer 33 (L) >60 mL/min   GFR calc Af Amer 39 (L) >60 mL/min   Anion gap 10 5 - 15  Glucose, capillary Status: Abnormal   Collection Time: 12/09/15 5:29 AM  Result Value Ref Range   Glucose-Capillary 156 (H) 65 - 99 mg/dL   Comment 1 Notify RN    Comment 2 Document in Chart       Imaging Results (Last 48 hours)    Dg Chest Port 1 View  12/08/2015 CLINICAL DATA: Shortness of breath EXAM: PORTABLE  CHEST 1 VIEW COMPARISON: 12/06/2015 FINDINGS: Chronic cardiomegaly. Biventricular pacer from the right with leads in stable position. Status post CABG. New hazy appearance at the right base with costophrenic sulcus blunting. No pneumothorax. Remote mid left clavicle fracture. IMPRESSION: New small right pleural effusion. Electronically Signed By: Monte Fantasia M.D. On: 12/08/2015 06:31     Assessment/Plan: Diagnosis: Deconditioning after GI bleed and fall 1. Does the need for close, 24 hr/day medical supervision in concert with the patient's rehab needs make it unreasonable for this patient to be served in a  less intensive setting? Yes 2. Co-Morbidities requiring supervision/potential complications: Acute on chronic systolic and diastolic congestive heart failure, coronary artery disease, type 2 diabetes, uncontrolled 3. Due to bladder management, bowel management, safety, skin/wound care, disease management, medication administration, pain management and patient education, does the patient require 24 hr/day rehab nursing? Yes 4. Does the patient require coordinated care of a physician, rehab nurse, PT (1-2 hrs/day, 5 days/week) and OT (1-2 hrs/day, 5 days/week) to address physical and functional deficits in the context of the above medical diagnosis(es)? Yes Addressing deficits in the following areas: balance, endurance, locomotion, strength, transferring, bowel/bladder control, bathing, dressing, feeding, grooming, toileting and cognition 5. Can the patient actively participate in an intensive therapy program of at least 3 hrs of therapy per day at least 5 days per week? Yes 6. The potential for patient to make measurable gains while on inpatient rehab is excellent 7. Anticipated functional outcomes upon discharge from inpatient rehab are modified independent with PT, modified independent with OT, n/a with SLP. 8. Estimated rehab length of stay to reach the above functional goals is: 7  days 9. Does the patient have adequate social supports and living environment to accommodate these discharge functional goals? Yes 10. Anticipated D/C setting: Home 11. Anticipated post D/C treatments: [place] therapy 12. Overall Rehab/Functional Prognosis: excellent  RECOMMENDATIONS: This patient's condition is appropriate for continued rehabilitative care in the following setting: CIR Patient has agreed to participate in recommended program. Yes Note that insurance prior authorization may be required for reimbursement for recommended care.  Comment: Wife is about 52 years younger she is able to help at home    12/09/2015       Revision History     Date/Time User Provider Type Action   12/09/2015 10:23 AM [name], MD Physician Sign   12/09/2015 6:53 AM Cathlyn Parsons, PA-C Physician Assistant [place]   View Details Report       Routing History     Date/Time From To Method   12/09/2015 10:23 AM [name], MD Leanna Battles, MD Fax

## 2015-12-15 NOTE — Interval H&P Note (Signed)
[name] was admitted today to Inpatient Rehabilitation with the diagnosis of debility/encephalopathy.  The patient's history has been reviewed, patient examined, and there is no change in status.  Patient continues to be appropriate for intensive inpatient rehabilitation.  I have reviewed the patient's chart and labs.  Questions were answered to the patient's satisfaction. The PAPE has been reviewed and assessment remains appropriate.  [name] T 12/15/2015, 5:21 PM

## 2015-12-15 NOTE — Discharge Summary (Addendum)
Physician Discharge Summary  CHRISTOPH SPAULDING [id number] DOB: 04-04-1925 DOA: 12/12/2015  PCP: Donnajean Lopes, MD  Admit date: 12/12/2015 Discharge date: 12/15/2015  Recommendations for Outpatient Follow-up:  1. Please continue Rocephin through 12/16/2015  Discharge Diagnoses:  Principal Problem:   Acute encephalopathy Active Problems:   Atrial fibrillation (HCC)   Biventricular cardiac pacemaker in situ   CKD (chronic kidney disease), stage III   Chronic combined systolic and diastolic congestive heart failure (HCC)   Anemia of chronic kidney failure    Discharge Condition: stable   Diet recommendation: as tolerated   History of present illness:  82 y.o. male with medical history significant for atrial fibrillation on Coumadin, CAD status post CABG, chronic kidney disease stage III, chronic systolic CHF, depression and anxiety, just discharged to inpatient rehabilitation 12/11/2015 after his hospitalization for GI bleed. He had tagged red blood cell study and sigmoidoscopy with identification of a bleeding rectal ulcer. He has received total of 8 units PRBC during that hospital stay. Patient transferred from rehabilitation to the telemetry floor 12/12/2015 after he was found to be unresponsive. Patient was unable to speak and the only thing he was able to do his blink eyes. Unable to do MRI because patient has pacemaker. The CT scan did not show acute intracranial findings. Neurology saw the patient in consultation and recommended starting Depakote.   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 Moderate stenosis bilateral vertebral artery origins which are otherwise widely patent - Unable to do MRI because patient has pacemaker - He was started on  Keppra for possible seizures. Per neurology, Keppra changed to Depakote 4/30 due to suspicion of seizure considering his presentation on admission. EEG did not demonstrated seizure activity.  - Palliative care consulted for goals of care, we appreciate their input   Hospital Course:  Escherichia coli UTI. - Continue Rocephin - would continue through 12/16/2015 which would be total of 5 days of IV Rocephin.  Chronic systolic and diastolic CHF, NYHA class 3/ Ischemic cardiomyopathy - 2-D echo 4/24 showed EF of 30-35%. PA pressure 64. - Respiratory status stable - Compensated  Chronic atrial fibrillation, complete heart block - [place] VASC 6 - Hemoglobin remains stable, 9.3 - Please note Coumadin was held even on prior admission due to risk of bleed  - Heart rate controlled with digoxin 0.0625 mg daily  Dyslipidemia - Continue Pravachol 20 mg at bedtime  Chronic kidney disease stage III - Baseline creatinine from November 2015 was 3.24 and on this admission, creatinine stable around 1.4  Anemia of chronic kidney disease - Hemoglobin is stable at 9.3   DVT prophylaxis: SCDs bilaterally due to risk of bleeding Code Status: full code  Family Communication: Spoke with the patient's wife over the phone 5/2;    Consultants:   Palliative care  Neurology  Procedures:   EEG 4/29 - No seizure activity noted.  Antimicrobials:   Rocephin 12/12/2015 --> 12/16/2015    Signed:  Leisa Lenz, MD  [facility] 12/15/2015, 12:39 PM  Pager #: [phone]  Time spent in minutes: more than 30 minutes   Discharge Exam: Filed Vitals:   12/15/15 0806 12/15/15 1207  BP:    Pulse:    Temp: 98 F (36.7 C) 98.2 F (36.8 C)  Resp:  Filed Vitals:   12/15/15 0330 12/15/15 0335 12/15/15 0806 12/15/15 1207  BP:  98/65    Pulse:  89    Temp: 97.7 F (36.5 C)  98 F (36.7 C) 98.2 F (36.8 C)  TempSrc: Axillary  Oral Oral  Resp:  18    Height:      Weight:       SpO2:  100%      General: Pt is alert, follows commands appropriately, not in acute distress Cardiovascular: Regular rate and rhythm, S1/S2 + Respiratory: Clear to auscultation bilaterally, no wheezing, no crackles, no rhonchi Abdominal: Soft, non tender, non distended, bowel sounds +, no guarding Extremities: no edema, no cyanosis, pulses palpable bilaterally DP and PT Neuro: Grossly nonfocal  Discharge Instructions  Discharge Instructions    Call MD for:  difficulty breathing, headache or visual disturbances    Complete by:  As directed      Call MD for:  persistant dizziness or light-headedness    Complete by:  As directed      Call MD for:  persistant nausea and vomiting    Complete by:  As directed      Call MD for:  severe uncontrolled pain    Complete by:  As directed      Diet - low sodium heart healthy    Complete by:  As directed      Increase activity slowly    Complete by:  As directed             Medication List    STOP taking these medications        furosemide 40 MG tablet  Commonly known as:  LASIX     megestrol 40 MG/ML suspension  Commonly known as:  MEGACE     Melatonin 10 MG Tabs      TAKE these medications        acetaminophen 500 MG tablet  Commonly known as:  TYLENOL  Take 2 tablets (1,000 mg total) by mouth 4 (four) times daily.     acetaminophen 325 MG tablet  Commonly known as:  TYLENOL  Take 2 tablets (650 mg total) by mouth every 6 (six) hours as needed for mild pain, moderate pain, fever or headache.     allopurinol 300 MG tablet  Commonly known as:  ZYLOPRIM  Take 300 mg by mouth daily.     ALPRAZolam 0.5 MG tablet  Commonly known as:  XANAX  Take 1 mg by mouth at bedtime.     calcium-vitamin D 500-200 MG-UNIT tablet  Commonly known as:  OSCAL WITH D  Take 1 tablet by mouth daily.     cefTRIAXone 1 g in dextrose 5 % 50 mL  Inject 1 g into the vein daily.     CLEAR EYES OP  Place 1 drop into both eyes daily as needed  (irritation/ eye drop).     digoxin 0.125 MG tablet  Commonly known as:  LANOXIN  Take 0.0625 mg by mouth daily.     divalproex 250 MG DR tablet  Commonly known as:  DEPAKOTE  Take 3 tablets (750 mg total) by mouth every 12 (twelve) hours.     haloperidol lactate 5 MG/ML injection  Commonly known as:  HALDOL  Inject 0.4 mLs (2 mg total) into the vein every 6 (six) hours as needed (if no improvement with ativan).     HYDROcodone-acetaminophen 5-325 MG tablet  Commonly known as:  NORCO/VICODIN  Take 0.5 tablets by mouth  at bedtime.     ipratropium 0.03 % nasal spray  Commonly known as:  ATROVENT  Place 1-2 sprays into both nostrils 3 (three) times daily as needed for rhinitis (congestion).     lovastatin 20 MG tablet  Commonly known as:  MEVACOR  Take 20 mg by mouth at bedtime. Reported on 11/24/2015     multivitamin-lutein Caps capsule  Take 1 capsule by mouth daily.     NASACORT AQ NA  Place 1 spray into both nostrils daily.     RESOURCE THICKENUP CLEAR Powd  Take 120 g by mouth as needed.     vitamin B-12 1000 MCG tablet  Commonly known as:  CYANOCOBALAMIN  Take 1,000 mcg by mouth daily.           Follow-up Information    Follow up with Donnajean Lopes, MD. Schedule an appointment as soon as possible for a visit in 3 weeks.   Specialty:  Internal Medicine   Why:  Follow up appt after recent hospitalization   Contact information:   [address] [place] [address] [phone]        The results of significant diagnostics from this hospitalization (including imaging, microbiology, ancillary and laboratory) are listed below for reference.    Significant Diagnostic Studies: Ct Abdomen Pelvis Wo Contrast  11/27/2015  CLINICAL DATA:  Acute onset of urinary retention and constipation. Rectal burning. Initial encounter. EXAM: CT ABDOMEN AND PELVIS WITHOUT CONTRAST TECHNIQUE: Multidetector CT imaging of the abdomen and pelvis was performed following the  standard protocol without IV contrast. COMPARISON:  CT of the abdomen and pelvis from 10/15/2015 FINDINGS: A trace right-sided pleural effusion is noted, with mild right basilar atelectasis. Pacemaker leads are partially imaged. The liver and spleen are unremarkable in appearance. The gallbladder is within normal limits. The pancreas and adrenal glands are unremarkable. Scattered right renal cysts are seen, one of which demonstrates some peripheral calcification. Nonspecific perinephric stranding is noted bilaterally. There is no evidence of hydronephrosis. No renal or ureteral stones are identified. No free fluid is identified. The small bowel is unremarkable in appearance. The stomach is within normal limits. No acute vascular abnormalities are seen. Scattered calcification is seen along the abdominal aorta and its branches, particularly prominent along the common iliac arteries bilaterally. The appendix is normal in caliber, without evidence of appendicitis. Scattered diverticulosis is noted along the descending and sigmoid colon, without evidence of diverticulitis. The rectum is largely filled with stool, measuring 7.0 cm in transverse dimension. The bladder is mildly distended, with a Foley catheter in place. The prostate remains normal in size. No inguinal lymphadenopathy is seen. No acute osseous abnormalities are identified. Chronic bilateral pars defects are seen at L5. IMPRESSION: 1. Rectum largely filled with stool, measuring 7.0 cm in transverse dimension, raising concern for mild fecal impaction. Would correlate with the patient's symptoms. The remainder of the colon is largely filled with stool. 2. Scattered diverticulosis along the descending and sigmoid colon, without evidence of diverticulitis. 3. Scattered right renal cysts, one of which demonstrates some peripheral calcification. Nonspecific bilateral perinephric stranding is grossly stable. 4. Previously noted ascites has resolved. 5. Residual  trace right pleural effusion, with mild right basilar atelectasis. 6. Scattered calcification along the abdominal aorta and its branches, particularly prominent along the common iliac arteries bilaterally. 7. Chronic bilateral pars defects at L5, without evidence of anterolisthesis. Electronically Signed   By: Garald Balding M.D.   On: 11/27/2015 21:56   Ct Angio Head W/cm &/or  Wo Cm  12/13/2015  CLINICAL DATA:  Altered mental status since 1400 hours, combative and agitated. History of stroke, atrial fibrillation, hypertension, hyperlipidemia, diabetes, bladder cancer. EXAM: CT ANGIOGRAPHY HEAD AND NECK TECHNIQUE: Multidetector CT imaging of the head and neck was performed using the standard protocol during bolus administration of intravenous contrast. Multiplanar CT image reconstructions and MIPs were obtained to evaluate the vascular anatomy. Carotid stenosis measurements (when applicable) are obtained utilizing NASCET criteria, using the distal internal carotid diameter as the denominator. CONTRAST:  50 cc Isovue 370 COMPARISON:  CT HEAD December 11, 2015 FINDINGS: CT HEAD INTRACRANIAL CONTENTS: The ventricles and sulci are normal for age. No intraparenchymal hemorrhage, mass effect nor midline shift. Small area RIGHT frontal encephalomalacia. Patchy supratentorial white matter hypodensities are within normal range for patient's age and though non-specific likely represent chronic small vessel ischemic disease. No acute large vascular territory infarcts. No abnormal extra-axial fluid collections. Basal cisterns are patent. Moderate calcific atherosclerosis of the carotid siphons. No abnormal intracranial enhancement. ORBITS: The included ocular globes and orbital contents are non-suspicious. Status post bilateral ocular lens implants. SINUSES: Soft tissue opacification LEFT maxillary sinus without expansion. Mastoid air cells are well aerated. SKULL/SOFT TISSUES: No skull fracture. No significant soft tissue  swelling. CTA NECK Aortic arch: Normal appearance of the thoracic arch, normal branch pattern. Mild calcific atherosclerosis of the aortic arch. The origins of the innominate, left Common carotid artery and subclavian artery are widely patent. Right carotid system: Common carotid artery is widely patent, coursing in a straight line fashion. Normal appearance of the carotid bifurcation without hemodynamically significant stenosis by NASCET criteria. Mild eccentric calcific atherosclerosis. Normal appearance of the included internal carotid artery. Left carotid system: Common carotid artery is widely patent, coursing in a straight line fashion. Calcific atherosclerosis and intimal thickening with occluded LEFT internal carotid artery from the origin through the intracranial segments. High-grade stenosis LEFT external carotid artery origin. Vertebral arteries:Left vertebral artery is dominant. Moderate stenosis bilateral vertebral artery origins. Vessels are otherwise widely patent. Skeleton: No acute osseous process though bone windows have not been submitted. Status post median sternotomy. Grade 1 C3-4 anterolisthesis and grade 1 C4-5 retrolisthesis on degenerative basis. Moderate to severe C3-4, C4-5 and C5-6 degenerative disc, severe at C6-7 resulting in moderate to severe RIGHT C3-4, LEFT C4-5, RIGHT C5-6 neural foraminal narrowing. Other neck: Soft tissues of the neck are non-acute though, not tailored for evaluation. Cardiac pacer wires in RIGHT chest. CTA HEAD Anterior circulation: LEFT internal carotid artery occlusion to the level of the carotid terminus were there is a retrograde flow via robust LEFT posterior and anterior communicating arteries. Mild calcific atherosclerosis with widely patent RIGHT internal carotid artery. Supernumerary anterior cerebral artery arising from LEFT A1-2 junction. Widely patent anterior and middle cerebral arteries. Posterior circulation: Normal appearance of the vertebral  arteries, vertebrobasilar junction and basilar artery, as well as main branch vessels. Bilateral posterior communicating arteries contribute to posterior circulation. Normal appearance of the posterior cerebral arteries. No hemodynamically significant stenosis, dissection, contrast extravasation or aneurysm within the anterior nor posterior circulation. Mild general luminal irregularity of the intracranial vessels compatible with atherosclerosis. IMPRESSION: CT HEAD: No acute intracranial process. No abnormal intracranial enhancement. 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 Otherwise negative CT HEAD for age. 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 Moderate stenosis bilateral vertebral artery origins which are otherwise widely patent. CTA HEAD: Retrograde flow to LEFT carotid  terminus with complete circle of Willis. No emergent large vessel occlusion or high-grade stenosis. Mild luminal irregularity of the intracranial vessels compatible with atherosclerosis. Electronically Signed   By: Elon Alas M.D.   On: 12/13/2015 02:17   Ct Head Wo Contrast  12/11/2015  CLINICAL DATA:  Decreased responsiveness beginning 1 hour ago. EXAM: CT HEAD WITHOUT CONTRAST TECHNIQUE: Contiguous axial images were obtained from the base of the skull through the vertex without intravenous contrast. COMPARISON:  11/24/2015 FINDINGS: Skull and Sinuses:Recently characterized incomplete left zygomaticomaxillary complex fractures. No new osseous abnormality. Chronic left maxillary sinusitis with near complete opacification. Visualized orbits: No acute finding Brain: No evidence of acute infarction, hemorrhage, hydrocephalus, or mass lesion/mass effect. Generalized atrophy. Diffuse chronic microvascular ischemic gliosis in the cerebral white matter. Chronic lacunar infarct in the right thalamus. Remote small  cortical infarct in the high posterior right frontal cortex. IMPRESSION: 1. Stable since prior.  No acute intracranial finding. 2. Nondisplaced left facial fractures as described previously. Electronically Signed   By: Monte Fantasia M.D.   On: 12/11/2015 23:33   Ct Head Wo Contrast  11/24/2015  CLINICAL DATA:  82 year old male who fell while walking to the male box. Struck his left head and face on concrete. Loss of consciousness. Headache. On blood thinners. Initial encounter. EXAM: CT HEAD WITHOUT CONTRAST CT MAXILLOFACIAL WITHOUT CONTRAST TECHNIQUE: Multidetector CT imaging of the head and maxillofacial structures were performed using the standard protocol without intravenous contrast. Multiplanar CT image reconstructions of the maxillofacial structures were also generated. COMPARISON:  None available. FINDINGS: CT HEAD FINDINGS Face findings are described below. No scalp hematoma identified. Calvarium intact. Cerebral volume loss. Patchy bilateral cerebral white matter hypodensity. Small chronic appearing lacunar infarcts in the cerebellum and right thalamus. Small area of cortical encephalomalacia in the right MCA territory of the superior right frontal gyrus series 2, image 25. No midline shift, mass effect, or evidence of intracranial mass lesion. No acute intracranial hemorrhage identified. No areas of acute cortically based infarction identified. CT MAXILLOFACIAL FINDINGS Negative visualized nonco Calcified atherosclerosis at the skull base. ntrast brain parenchyma aside from cerebral volume loss. Calcified atherosclerosis at the skull base. No scalp hematoma identified. Postoperative changes to both globes. No acute orbit soft tissue abnormality identified. There is a left face subcutaneous contusion or hematoma on series 4, image 41, at an just below the zygoma. There is mild abnormal retro maxillary soft tissue density (series 4, image 53). Other visualized noncontrast deep soft tissue spaces of the  face remain normal. The mandible remains intact. There is a mildly comminuted fracture through the posterior superior wall of the left maxillary sinus (series 5, image 57). Hyperdensity throughout the left maxillary sinus therefore may be hemorrhage. The other paranasal sinuses remain clear. There is a nondisplaced fracture of the left zygomatic arch (image 54). There is a mildly comminuted fracture of the lateral wall of the left orbit. The left orbital floor appears to remain intact. No nasal bone fracture identified. No contralateral right face fractures identified. Advanced degenerative changes in the visualized cervical spine with multilevel spondylolisthesis. IMPRESSION: 1. Mildly comminuted fractures of the left maxillary sinus posterior wall, left orbit lateral wall, left zygomatic arch. Overlying soft tissue hematoma. Hemorrhage in the left maxillary sinus. 2. No other acute facial fracture. 3. Chronic small and medium-sized vessel ischemia in the brain with no acute intracranial abnormality. Electronically Signed   By: Genevie Ann M.D.   On: 11/24/2015 14:52   Ct Angio Neck W/cm &/or  Wo/cm  12/13/2015  CLINICAL DATA:  Altered mental status since 1400 hours, combative and agitated. History of stroke, atrial fibrillation, hypertension, hyperlipidemia, diabetes, bladder cancer. EXAM: CT ANGIOGRAPHY HEAD AND NECK TECHNIQUE: Multidetector CT imaging of the head and neck was performed using the standard protocol during bolus administration of intravenous contrast. Multiplanar CT image reconstructions and MIPs were obtained to evaluate the vascular anatomy. Carotid stenosis measurements (when applicable) are obtained utilizing NASCET criteria, using the distal internal carotid diameter as the denominator. CONTRAST:  50 cc Isovue 370 COMPARISON:  CT HEAD December 11, 2015 FINDINGS: CT HEAD INTRACRANIAL CONTENTS: The ventricles and sulci are normal for age. No intraparenchymal hemorrhage, mass effect nor midline shift.  Small area RIGHT frontal encephalomalacia. Patchy supratentorial white matter hypodensities are within normal range for patient's age and though non-specific likely represent chronic small vessel ischemic disease. No acute large vascular territory infarcts. No abnormal extra-axial fluid collections. Basal cisterns are patent. Moderate calcific atherosclerosis of the carotid siphons. No abnormal intracranial enhancement. ORBITS: The included ocular globes and orbital contents are non-suspicious. Status post bilateral ocular lens implants. SINUSES: Soft tissue opacification LEFT maxillary sinus without expansion. Mastoid air cells are well aerated. SKULL/SOFT TISSUES: No skull fracture. No significant soft tissue swelling. CTA NECK Aortic arch: Normal appearance of the thoracic arch, normal branch pattern. Mild calcific atherosclerosis of the aortic arch. The origins of the innominate, left Common carotid artery and subclavian artery are widely patent. Right carotid system: Common carotid artery is widely patent, coursing in a straight line fashion. Normal appearance of the carotid bifurcation without hemodynamically significant stenosis by NASCET criteria. Mild eccentric calcific atherosclerosis. Normal appearance of the included internal carotid artery. Left carotid system: Common carotid artery is widely patent, coursing in a straight line fashion. Calcific atherosclerosis and intimal thickening with occluded LEFT internal carotid artery from the origin through the intracranial segments. High-grade stenosis LEFT external carotid artery origin. Vertebral arteries:Left vertebral artery is dominant. Moderate stenosis bilateral vertebral artery origins. Vessels are otherwise widely patent. Skeleton: No acute osseous process though bone windows have not been submitted. Status post median sternotomy. Grade 1 C3-4 anterolisthesis and grade 1 C4-5 retrolisthesis on degenerative basis. Moderate to severe C3-4, C4-5 and C5-6  degenerative disc, severe at C6-7 resulting in moderate to severe RIGHT C3-4, LEFT C4-5, RIGHT C5-6 neural foraminal narrowing. Other neck: Soft tissues of the neck are non-acute though, not tailored for evaluation. Cardiac pacer wires in RIGHT chest. CTA HEAD Anterior circulation: LEFT internal carotid artery occlusion to the level of the carotid terminus were there is a retrograde flow via robust LEFT posterior and anterior communicating arteries. Mild calcific atherosclerosis with widely patent RIGHT internal carotid artery. Supernumerary anterior cerebral artery arising from LEFT A1-2 junction. Widely patent anterior and middle cerebral arteries. Posterior circulation: Normal appearance of the vertebral arteries, vertebrobasilar junction and basilar artery, as well as main branch vessels. Bilateral posterior communicating arteries contribute to posterior circulation. Normal appearance of the posterior cerebral arteries. No hemodynamically significant stenosis, dissection, contrast extravasation or aneurysm within the anterior nor posterior circulation. Mild general luminal irregularity of the intracranial vessels compatible with atherosclerosis. IMPRESSION: CT HEAD: No acute intracranial process. No abnormal intracranial enhancement. 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 Otherwise negative CT HEAD for age. 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 Moderate stenosis bilateral vertebral artery origins which are otherwise widely patent. CTA HEAD: Retrograde flow to LEFT carotid terminus  with complete circle of Willis. No emergent large vessel occlusion or high-grade stenosis. Mild luminal irregularity of the intracranial vessels compatible with atherosclerosis. Electronically Signed   By: Elon Alas M.D.   On: 12/13/2015 02:17   Nm Gi Blood Loss  12/05/2015  CLINICAL DATA:  Bright  red blood per rectum. Patient actively bleeding. Gastrointestinal bleeding K92.2 (ICD-10-CM) Gastrointestinal hemorrhage, unspecified gastritis, unspecified gastrointestinal hemorrhage type K92.2 (ICD-10-CM) EXAM: NUCLEAR MEDICINE GASTROINTESTINAL BLEEDING SCAN TECHNIQUE: Sequential abdominal images were obtained following intravenous administration of Tc-62m labeled red blood cells. RADIOPHARMACEUTICALS:  25.2 mCi Tc-41m in-vitro labeled red cells. COMPARISON:  CT, 11/27/2015 FINDINGS: There is a small elongated focus of uptake in the right central pelvis, adjacent to the right dome of the bladder and right common iliac artery, which becomes apparent approximately 20 minutes following the injection of radiotracer, fading later during the examination. Although somewhat equivocal, this suggests a gastro intestinal bleeding site from the sigmoid colon. There is no other evidence of a GI bleeding source. No other abnormal radiotracer accumulation. IMPRESSION: 1. Small area of abnormal radiotracer accumulation in right central pelvis suggesting a GI bleeding source from the sigmoid colon. Electronically Signed   By: Lajean Manes M.D.   On: 12/05/2015 14:51   Dg Chest Port 1 View  12/11/2015  CLINICAL DATA:  82 year old male with decreased responsiveness. EXAM: PORTABLE CHEST 1 VIEW COMPARISON:  Chest x-ray 12/08/2015. FINDINGS: Right pleural effusion. Probable atelectasis in the right lung base. Left lung is clear. No evidence of pulmonary edema. Mild cardiomegaly. Upper mediastinal contours are within normal limits. Atherosclerosis in the thoracic aorta. Right-sided biventricular pacemaker with lead tip projecting over the expected location of the right ventricle and overlying the left ventricle via the coronary sinus and coronary veins. Status post median sternotomy for CABG, including LIMA. IMPRESSION: 1. Right pleural effusion with probable subsegmental atelectasis in the right lower lobe. 2. Mild cardiomegaly.  3. Atherosclerosis. Electronically Signed   By: Vinnie Langton M.D.   On: 12/11/2015 22:43   Dg Chest Port 1 View  12/08/2015  CLINICAL DATA:  Shortness of breath EXAM: PORTABLE CHEST 1 VIEW COMPARISON:  12/06/2015 FINDINGS: Chronic cardiomegaly. Biventricular pacer from the right with leads in stable position. Status post CABG. New hazy appearance at the right base with costophrenic sulcus blunting. No pneumothorax. Remote mid left clavicle fracture. IMPRESSION: New small right pleural effusion. Electronically Signed   By: Monte Fantasia M.D.   On: 12/08/2015 06:31   Dg Chest Port 1 View  12/06/2015  CLINICAL DATA:  CHF EXAM: PORTABLE CHEST 1 VIEW COMPARISON:  12/04/2015 chest radiograph. FINDINGS: Sternotomy wires appear aligned and intact. Stable configuration of 2 lead right subclavian pacemaker with lead tips overlying the coronary sinus and right ventricle. CABG clips overlie the mediastinum. Stable cardiomediastinal silhouette with normal heart size. No pneumothorax. No pleural effusion. Lungs appear clear, with no acute consolidative airspace disease and no pulmonary edema. IMPRESSION: No active disease. Electronically Signed   By: Ilona Sorrel M.D.   On: 12/06/2015 14:27   Dg Chest Port 1 View  12/04/2015  CLINICAL DATA:  Altered mental status.  GI bleed. EXAM: PORTABLE CHEST 1 VIEW COMPARISON:  11/27/2015 FINDINGS: Cardiac pacemaker. Postoperative changes in the mediastinum. Shallow inspiration. Heart size and pulmonary vascularity are normal. No focal airspace disease or consolidation in the lungs. No blunting of costophrenic angles. No pneumothorax. Old left rib fractures. Old left clavicular fracture. IMPRESSION: No active disease. Electronically Signed   By: Oren Beckmann.D.  On: 12/04/2015 23:57   Dg Abd Acute W/chest  11/27/2015  CLINICAL DATA:  Burning sensations in the rectal region and constipation with urine blockage. EXAM: DG ABDOMEN ACUTE W/ 1V CHEST COMPARISON:  Previous  examinations. FINDINGS: The heart remains grossly normal in size. Stable post CABG changes and right subclavian pacemaker leads. Clear lungs with normal vascularity. Normal bowel gas pattern without free peritoneal air. Prominent stool throughout the colon. Mild scoliosis. Mild lumbar spine degenerative changes. Atheromatous arterial calcifications. IMPRESSION: No acute abnormality.  Prominent stool. Electronically Signed   By: Claudie Revering M.D.   On: 11/27/2015 18:22   Ct Maxillofacial Wo Cm  11/24/2015  CLINICAL DATA:  82 year old male who fell while walking to the male box. Struck his left head and face on concrete. Loss of consciousness. Headache. On blood thinners. Initial encounter. EXAM: CT HEAD WITHOUT CONTRAST CT MAXILLOFACIAL WITHOUT CONTRAST TECHNIQUE: Multidetector CT imaging of the head and maxillofacial structures were performed using the standard protocol without intravenous contrast. Multiplanar CT image reconstructions of the maxillofacial structures were also generated. COMPARISON:  None available. FINDINGS: CT HEAD FINDINGS Face findings are described below. No scalp hematoma identified. Calvarium intact. Cerebral volume loss. Patchy bilateral cerebral white matter hypodensity. Small chronic appearing lacunar infarcts in the cerebellum and right thalamus. Small area of cortical encephalomalacia in the right MCA territory of the superior right frontal gyrus series 2, image 25. No midline shift, mass effect, or evidence of intracranial mass lesion. No acute intracranial hemorrhage identified. No areas of acute cortically based infarction identified. CT MAXILLOFACIAL FINDINGS Negative visualized nonco Calcified atherosclerosis at the skull base. ntrast brain parenchyma aside from cerebral volume loss. Calcified atherosclerosis at the skull base. No scalp hematoma identified. Postoperative changes to both globes. No acute orbit soft tissue abnormality identified. There is a left face subcutaneous  contusion or hematoma on series 4, image 41, at an just below the zygoma. There is mild abnormal retro maxillary soft tissue density (series 4, image 53). Other visualized noncontrast deep soft tissue spaces of the face remain normal. The mandible remains intact. There is a mildly comminuted fracture through the posterior superior wall of the left maxillary sinus (series 5, image 57). Hyperdensity throughout the left maxillary sinus therefore may be hemorrhage. The other paranasal sinuses remain clear. There is a nondisplaced fracture of the left zygomatic arch (image 54). There is a mildly comminuted fracture of the lateral wall of the left orbit. The left orbital floor appears to remain intact. No nasal bone fracture identified. No contralateral right face fractures identified. Advanced degenerative changes in the visualized cervical spine with multilevel spondylolisthesis. IMPRESSION: 1. Mildly comminuted fractures of the left maxillary sinus posterior wall, left orbit lateral wall, left zygomatic arch. Overlying soft tissue hematoma. Hemorrhage in the left maxillary sinus. 2. No other acute facial fracture. 3. Chronic small and medium-sized vessel ischemia in the brain with no acute intracranial abnormality. Electronically Signed   By: Genevie Ann M.D.   On: 11/24/2015 14:52    Microbiology: Recent Results (from the past 240 hour(s))  Culture, blood (routine x 2)     Status: None (Preliminary result)   Collection Time: 12/12/15 12:25 AM  Result Value Ref Range Status   Specimen Description BLOOD RIGHT HAND  Final   Special Requests IN PEDIATRIC BOTTLE 1CC  Final   Culture NO GROWTH 3 DAYS  Final   Report Status PENDING  Incomplete  Culture, blood (routine x 2)     Status: None (Preliminary result)  Collection Time: 12/12/15 12:40 AM  Result Value Ref Range Status   Specimen Description BLOOD LEFT HAND  Final   Special Requests IN PEDIATRIC BOTTLE 1CC  Final   Culture NO GROWTH 3 DAYS  Final    Report Status PENDING  Incomplete     Labs: Basic Metabolic Panel:  Recent Labs Lab 12/09/15 0453 12/10/15 0308 12/11/15 0224 12/11/15 2219 12/14/15 2118  NA 128* 133* 135 133* 133*  K 5.0 4.7 4.3 4.5 4.8  CL 100* 101 100* 99* 102  CO2 18* 21* 23 24 21*  GLUCOSE 153* 203* 160* 115* 71  BUN 60* 64* 57* 61* 37*  CREATININE 1.74* 1.53* 1.46* 1.50* 1.41*  CALCIUM 7.7* 7.8* 8.0* 7.8* 7.6*  MG  --   --   --   --  2.0   Liver Function Tests:  Recent Labs Lab 12/14/15 2118  AST 36  ALT 40  ALKPHOS 90  BILITOT 0.5  PROT 4.4*  ALBUMIN 2.4*   No results for input(s): LIPASE, AMYLASE in the last 168 hours.  Recent Labs Lab 12/12/15 0632  AMMONIA 23   CBC:  Recent Labs Lab 12/09/15 0453 12/10/15 0308 12/11/15 0224 12/11/15 2219 12/12/15 0631 12/14/15 2118  WBC 19.9* 20.2* 16.4* 17.4*  --  9.4  NEUTROABS  --   --   --  14.4*  --   --   HGB 8.3* 8.6* 8.5* 8.8*  --  9.3*  HCT 24.1* 25.7* 25.7* 26.5* 26.6* 28.9*  MCV 88.3 89.2 90.5 92.7  --  95.4  PLT 130* 192 199 214  --  206   Cardiac Enzymes:  Recent Labs Lab 12/11/15 2219 12/12/15 0631  TROPONINI 0.19* 0.19*   BNP: BNP (last 3 results)  Recent Labs  12/06/15 0518  BNP 2311.3*    ProBNP (last 3 results) No results for input(s): PROBNP in the last 8760 hours.  CBG:  Recent Labs Lab 12/14/15 1914 12/14/15 2337 12/15/15 0334 12/15/15 0803 12/15/15 1206  GLUCAP 135* 114* 94 96 224*

## 2015-12-15 NOTE — H&P (View-Only) (Signed)
Physical Medicine and Rehabilitation Admission H&P    Chief complaint: Weakness  HPI: [name] is a 82 y.o. right handed male with history of atrial fibrillation maintained on chronic Coumadin, coronary artery disease/Pacemaker status post CABG, systolic and diastolic congestive heart failure, type 2 diabetes mellitus, bladder cancer. Lives with wife in [place]. Reports to be independent prior to admission with a general decline over the past few months.Recent fall and hit the corner of his eye on the sink. Presented 12/05/2015 with bright red blood from the rectum as well as a recent fall from the toilet approximately 1 week ago. Patient reports some recent constipation 2 weeks ago but denied any nausea vomiting or abdominal pain. Patient with ongoing active bleeding in the ED. Hemoglobin 6.5. INR 4.86. EKG showed new ST changes and T-wave inversions in the anterior leads. Troponin 0.20. Patient did receive vitamin K fresh frozen plasma 2 units and 1 unit of packed red blood cells. Gastroenterology services consulted Dr. Carlean Purl with findings of a single solitary ulcer in the rectum and in the distal rectum. Underwent epi injection and then cauterization. Close monitoring of hemoglobin status post PRBCs 8 with latest hemoglobin 8.3. Patient's chronic Coumadin currently remains on hold and presently on aspirin.Vascular study bilateral upper extremities 12/10/2015 showed superficial thrombus noted in the right basilic vein and in the left cephalic vein. He was placed on subcutaneous heparin for DVT prophylaxis and monitored. Current plan is to remain off Coumadin for 2 weeks per gastroenterology services. Follow-up cardiology services with echocardiogram showing ejection fraction of 35%. Akinesis of the anterior inferior lateral and apical myocardium. Placed on intravenous Lasix for acute on chronic systolic and diastolic heart failure and transition to by mouth. Close monitoring of  creatinine 1.89-2.43. Tolerating a regular diet. Urine culture Escherichia coli presently maintained on Rocephin until 12/16/2015. Physical therapy evaluation completed 12/08/2015 for debilitation with recommendations of physical medicine rehabilitation consult. Patient was admitted for a comprehensive rehabilitation program 12/09/2015. Patient was slow progressive gains. On the evening of 12/11/2015 noted waxing and waning altered mental status and transient episode of being unresponsive. Cranial CT scan negative. Patient was discharged to acute care services for ongoing workup. Neurology was consulted. EEG negative for seizure. Neurology was still concern of possible seizure clinically and was placed on Depakote. 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 CTA of head showed no emergent large vessel occlusion or high-grade stenosis. Subcutaneous heparin the patient had been initially placed on for superficial thrombus right basilic vein and left cephalic vein had been discontinued due to risk of bleeding. Mental status continued to improve. Palliative care was consulted for goals of care. Diet was downgraded to a mechanical soft nectar thick liquid. Therapies resumed 12/14/2015. He was able to stand and walk short distances. Patient was readmitted for comprehensive rehabilitation program.  ROS ROS Constitutional: Negative for fever and chills.  HENT: Negative for hearing loss.  Eyes: Negative for blurred vision and double vision.  Respiratory: Positive for shortness of breath. Negative for cough.  Cardiovascular: Positive for palpitations and leg swelling. Negative for chest pain.  Gastrointestinal: Positive for constipation. Negative for nausea and vomiting.  Genitourinary: Negative for dysuria and hematuria.  Musculoskeletal: Positive for myalgias, joint pain and falls. Negative for back pain.  Skin: Negative  for rash.  Neurological: Positive for weakness. Negative for seizures and headaches.  All other systems reviewed and are negative  Past Medical History  Diagnosis Date  . Atrial fibrillation (HCC)     Chronic  . Coronary artery disease 2004    Status post CABG with LIMA-LAD, SVG-CFX. Hx of anterior apical infarct.  . Renal insufficiency     Chronic  . Hypertension   . Hyperlipidemia   . Gout   . CHF (congestive heart failure) (HCC)     with chronic ischemic cardiomypathy. EF of 10-15%.  CHF due to systolic dysfunction. Clinically doing well.  [name] GERD (gastroesophageal reflux disease)   . Pacemaker     Medtronic biventricular pacemaker, serial number [id number]  . Type II diabetes mellitus ([place])     "been off my RX for ~ 2 yr" (03/20/2013)  . H/O hiatal hernia   . Arthritis     "right thumb" (03/20/2013)  . Depression   . Anxiety   . Bladder cancer ([place])     "low grade; grade 1; non-invasive" (03/20/2013)  . CKD (chronic kidney disease), stage III    Past Surgical History  Procedure Laterality Date  . Transurethral resection of prostate  1990's; 2004    "twice; injected chemo 1st time; didn't do that 2nd time" (03/20/2013)  . Nasal septum surgery    . US echocardiography  04-17-2009    Est EF 10-15%  . Cardiovascular stress test  04-17-2009    EF 29%  . Coronary artery bypass graft  02/19/2003    LIMA-LAD, SVG-CFX  . Carotid endarterectomy Right 1990's    "left side is still  100% blocked" (03/20/2013)  . Icd lead removal Left 03/01/2013    Procedure: ICD LEAD REMOVAL;  Surgeon: Evans Lance, MD;  Location: [facility];  Service: Cardiovascular;  Laterality: Left;  . Pacemaker lead removal Left 03/01/2013    Procedure: PACEMAKER LEAD REMOVAL;  Surgeon: Evans Lance, MD;  Location: [facility];  Service: Cardiovascular;  Laterality: Left;  . Pacemaker insertion N/A 03/01/2013    Procedure: INSERTION PACEMAKER LEAD;  Surgeon: Evans Lance, MD;  Location: [facility];  Service: Cardiovascular;   Laterality: N/A;  . Hemorrhoid surgery      "lanced several years ago" (03/20/2013)  . Tonsillectomy and adenoidectomy  1933  . Cataract extraction w/ intraocular lens  implant, bilateral Bilateral ~ 2000  . Bi-ventricular pacemaker insertion N/A 03/20/2013    Procedure: BI-VENTRICULAR PACEMAKER INSERTION (CRT-P);  Surgeon: Evans Lance, MD; Medtronic biventricular pacemaker, serial number [id number]; Laterality: Right  . Flexible sigmoidoscopy N/A 12/05/2015    Procedure: FLEXIBLE SIGMOIDOSCOPY;  Surgeon: [name], MD;  Location: [facility];  Service: Endoscopy;  Laterality: N/A;   Family History  Problem Relation Age of Onset  . Heart attack Mother    Social History:  reports that he quit smoking about 55 years ago. His smoking use included Cigarettes. He has a 17 pack-year smoking history. He has never used smokeless tobacco. He reports that he drinks alcohol. He reports that he does not use illicit drugs. Allergies:  Allergies  Allergen Reactions  . Penicillins Hives and Itching    Has patient had a PCN reaction causing immediate rash, facial/tongue/throat swelling, SOB or lightheadedness with hypotension: YES Has patient had a PCN reaction causing severe rash involving mucus membranes or skin necrosis: NO Has patient had a PCN reaction that required hospitalizationNO Has patient had a PCN reaction occurring within the last 10 years: NO If all of the above answers are "NO", then may proceed with Cephalosporin use.   Medications Prior to  Admission  Medication Sig Dispense Refill  . allopurinol (ZYLOPRIM) 300 MG tablet Take 300 mg by mouth daily.      [name] ALPRAZolam (XANAX) 0.5 MG tablet Take 1 mg by mouth at bedtime.     . calcium-vitamin D (OSCAL WITH D) 500-200 MG-UNIT per tablet Take 1 tablet by mouth daily.     . digoxin (LANOXIN) 0.125 MG tablet Take 0.0625 mg by mouth daily.     . furosemide (LASIX) 40 MG tablet Take 80 mg by mouth daily.    [name] HYDROcodone-acetaminophen  (NORCO/VICODIN) 5-325 MG per tablet Take 0.5 tablets by mouth at bedtime.     [name] ipratropium (ATROVENT) 0.03 % nasal spray Place 1-2 sprays into both nostrils 3 (three) times daily as needed for rhinitis (congestion).   4  . lovastatin (MEVACOR) 20 MG tablet Take 20 mg by mouth at bedtime. Reported on 11/24/2015    . megestrol (MEGACE) 40 MG/ML suspension Take 400 mg by mouth 2 (two) times daily. 10 mls - every morning and at 3pm    . Melatonin 10 MG TABS Take 10 mg by mouth at bedtime.    . multivitamin-lutein (OCUVITE-LUTEIN) CAPS Take 1 capsule by mouth daily.    . Naphazoline HCl (CLEAR EYES OP) Place 1 drop into both eyes daily as needed (irritation/ eye drop).    . Triamcinolone Acetonide (NASACORT AQ NA) Place 1 spray into both nostrils daily.    . vitamin B-12 (CYANOCOBALAMIN) 1000 MCG tablet Take 1,000 mcg by mouth daily.      Home: Home Living Family/patient expects to be discharged to:: Private residence Living Arrangements: Spouse/significant other Available Help at Discharge: Family, Available 24 hours/day (wife is 20 years yonger than pt) Type of Home: House Home Access: Stairs to enter Technical brewer of Steps: 3 Entrance Stairs-Rails: Left Home Layout: One level Bathroom Shower/Tub: Gaffer, Door [employer] Toilet: Handicapped height Bathroom Accessibility: Yes Home Equipment: Radio producer - single point, Civil engineer, contracting - built in Additional Comments: Pt was independent PTA and still drove   Functional History: Prior Function Level of Independence: Independent Comments: still drives, had dog, "Mickey"  Functional Status:  Mobility: Bed Mobility Overal bed mobility: Needs Assistance Bed Mobility: Supine to Sit Supine to sit: Min assist, HOB elevated General bed mobility comments: Min assist to support trunk and help progresss legs over EOB with HOB elevated.  Pt using bed rail with cues and manual assist to find bedrail from therapist.  Transfers Overall transfer  level: Needs assistance Equipment used: Rolling walker (2 wheeled) Transfers: Sit to/from Stand Sit to Stand: Min assist General transfer comment: Min assist to support trunk during transition to stand over weak legs.  Ambulation/Gait Ambulation/Gait assistance: Min assist Ambulation Distance (Feet): 50 Feet Assistive device: Rolling walker (2 wheeled) Gait Pattern/deviations: Step-through pattern, Shuffle, Trunk flexed General Gait Details: Pt with slow, shuffling gait speed, flexed over RW.      ADL:    Cognition: Cognition Overall Cognitive Status: Impaired/Different from baseline Orientation Level: Oriented to person, Oriented to place, Oriented to time, Disoriented to situation Cognition Arousal/Alertness: Awake/alert Behavior During Therapy: WFL for tasks assessed/performed Overall Cognitive Status: Impaired/Different from baseline Area of Impairment: Memory Memory: Decreased short-term memory (pt having difficult time with remembering events of past few)  Physical Exam: Blood pressure 98/65, pulse 89, temperature 98 F (36.7 C), temperature source Oral, resp. rate 18, height 5' 10"  (1.778 m), weight 50 kg (110 lb 3.7 oz), SpO2 100 %. Physical Exam  Vitals reviewed. HENT:  Bruising to left orbital area and side of face  Eyes: EOM are normal.  Neck: Normal range of motion. Neck supple. No thyromegaly present.  Cardiovascular:  Cardiac rate controlled  Respiratory: Effort normal and breath sounds normal. No respiratory distress.  GI: Soft. Bowel sounds are normal. He exhibits no distension.  Neurological: He is alert.  Makes good eye contact with examiner. Follows simple commands. He is able to state his name and age in place. He recalled being on the rehab floor and actually recalled I conversation we had the afternoon we admitted him. UE grossly 4/5 prox to distal. LE: 3+/5 HF, KE 4-, ADF/PF 4/5. Sensory exam grossly intact.   Skin: Skin is warm and dry.  Numerous  bruises on his arms and legs. A few scattered abrasions and small wound near right fibular head.   Psychiatric: He has a normal mood and affect. His behavior is normal.    Results for orders placed or performed during the hospital encounter of 12/12/15 (from the past 48 hour(s))  Glucose, capillary     Status: Abnormal   Collection Time: 12/13/15 11:03 AM  Result Value Ref Range   Glucose-Capillary 108 (H) 65 - 99 mg/dL  Glucose, capillary     Status: Abnormal   Collection Time: 12/13/15  3:48 PM  Result Value Ref Range   Glucose-Capillary 182 (H) 65 - 99 mg/dL   Comment 1 Notify RN   Glucose, capillary     Status: Abnormal   Collection Time: 12/13/15  7:12 PM  Result Value Ref Range   Glucose-Capillary 142 (H) 65 - 99 mg/dL  Glucose, capillary     Status: Abnormal   Collection Time: 12/13/15 11:54 PM  Result Value Ref Range   Glucose-Capillary 163 (H) 65 - 99 mg/dL  Glucose, capillary     Status: None   Collection Time: 12/14/15  4:06 AM  Result Value Ref Range   Glucose-Capillary 78 65 - 99 mg/dL  Procalcitonin     Status: None   Collection Time: 12/14/15  5:26 AM  Result Value Ref Range   Procalcitonin 0.11 ng/mL    Comment:        Interpretation: PCT (Procalcitonin) <= 0.5 ng/mL: Systemic infection (sepsis) is not likely. Local bacterial infection is possible. (NOTE)         ICU PCT Algorithm               Non ICU PCT Algorithm    ----------------------------     ------------------------------         PCT < 0.25 ng/mL                 PCT < 0.1 ng/mL     Stopping of antibiotics            Stopping of antibiotics       strongly encouraged.               strongly encouraged.    ----------------------------     ------------------------------       PCT level decrease by               PCT < 0.25 ng/mL       >= 80% from peak PCT       OR PCT 0.25 - 0.5 ng/mL          Stopping of antibiotics  encouraged.     Stopping of  antibiotics           encouraged.    ----------------------------     ------------------------------       PCT level decrease by              PCT >= 0.25 ng/mL       < 80% from peak PCT        AND PCT >= 0.5 ng/mL            Continuin g antibiotics                                              encouraged.       Continuing antibiotics            encouraged.    ----------------------------     ------------------------------     PCT level increase compared          PCT > 0.5 ng/mL         with peak PCT AND          PCT >= 0.5 ng/mL             Escalation of antibiotics                                          strongly encouraged.      Escalation of antibiotics        strongly encouraged.   Glucose, capillary     Status: Abnormal   Collection Time: 12/14/15  7:21 AM  Result Value Ref Range   Glucose-Capillary 104 (H) 65 - 99 mg/dL   Comment 1 Notify RN    Comment 2 Document in Chart   Glucose, capillary     Status: Abnormal   Collection Time: 12/14/15 12:17 PM  Result Value Ref Range   Glucose-Capillary 164 (H) 65 - 99 mg/dL   Comment 1 Notify RN    Comment 2 Document in Chart   Glucose, capillary     Status: Abnormal   Collection Time: 12/14/15  2:55 PM  Result Value Ref Range   Glucose-Capillary 171 (H) 65 - 99 mg/dL   Comment 1 Notify RN    Comment 2 Document in Chart   Glucose, capillary     Status: Abnormal   Collection Time: 12/14/15  7:14 PM  Result Value Ref Range   Glucose-Capillary 135 (H) 65 - 99 mg/dL  Digoxin level     Status: Abnormal   Collection Time: 12/14/15  9:18 PM  Result Value Ref Range   Digoxin Level 0.5 (L) 0.8 - 2.0 ng/mL  Magnesium     Status: None   Collection Time: 12/14/15  9:18 PM  Result Value Ref Range   Magnesium 2.0 1.7 - 2.4 mg/dL  Valproic acid level     Status: None   Collection Time: 12/14/15  9:18 PM  Result Value Ref Range   Valproic Acid Lvl 82 50.0 - 100.0 ug/mL  Comprehensive metabolic panel     Status: Abnormal   Collection  Time: 12/14/15  9:18 PM  Result Value Ref Range   Sodium 133 (L) 135 - 145 mmol/L   Potassium 4.8 3.5 - 5.1 mmol/L   Chloride 102 101 - 111  mmol/L   CO2 21 (L) 22 - 32 mmol/L   Glucose, Bld 71 65 - 99 mg/dL   BUN 37 (H) 6 - 20 mg/dL   Creatinine, Ser 1.41 (H) 0.61 - 1.24 mg/dL   Calcium 7.6 (L) 8.9 - 10.3 mg/dL   Total Protein 4.4 (L) 6.5 - 8.1 g/dL   Albumin 2.4 (L) 3.5 - 5.0 g/dL   AST 36 15 - 41 U/L   ALT 40 17 - 63 U/L   Alkaline Phosphatase 90 38 - 126 U/L   Total Bilirubin 0.5 0.3 - 1.2 mg/dL   GFR calc non Af Amer 43 (L) >60 mL/min   GFR calc Af Amer 50 (L) >60 mL/min    Comment: (NOTE) The eGFR has been calculated using the CKD EPI equation. This calculation has not been validated in all clinical situations. eGFR's persistently <60 mL/min signify possible Chronic Kidney Disease.    Anion gap 10 5 - 15  CBC     Status: Abnormal   Collection Time: 12/14/15  9:18 PM  Result Value Ref Range   WBC 9.4 4.0 - 10.5 K/uL   RBC 3.03 (L) 4.22 - 5.81 MIL/uL   Hemoglobin 9.3 (L) 13.0 - 17.0 g/dL   HCT 28.9 (L) 39.0 - 52.0 %   MCV 95.4 78.0 - 100.0 fL   MCH 30.7 26.0 - 34.0 pg   MCHC 32.2 30.0 - 36.0 g/dL   RDW 19.3 (H) 11.5 - 15.5 %   Platelets 206 150 - 400 K/uL  Glucose, capillary     Status: Abnormal   Collection Time: 12/14/15 11:37 PM  Result Value Ref Range   Glucose-Capillary 114 (H) 65 - 99 mg/dL  Urinalysis, Routine w reflex microscopic (not at [facility])     Status: None   Collection Time: 12/15/15  2:55 AM  Result Value Ref Range   Color, Urine YELLOW YELLOW   APPearance CLEAR CLEAR   Specific Gravity, Urine 1.017 1.005 - 1.030   pH 6.5 5.0 - 8.0   Glucose, UA NEGATIVE NEGATIVE mg/dL   Hgb urine dipstick NEGATIVE NEGATIVE   Bilirubin Urine NEGATIVE NEGATIVE   Ketones, ur NEGATIVE NEGATIVE mg/dL   Protein, ur NEGATIVE NEGATIVE mg/dL   Nitrite NEGATIVE NEGATIVE   Leukocytes, UA NEGATIVE NEGATIVE    Comment: MICROSCOPIC NOT DONE ON URINES WITH NEGATIVE PROTEIN,  BLOOD, LEUKOCYTES, NITRITE, OR GLUCOSE <1000 mg/dL.  Glucose, capillary     Status: None   Collection Time: 12/15/15  3:34 AM  Result Value Ref Range   Glucose-Capillary 94 65 - 99 mg/dL   No results found.     Medical Problem List and Plan: 1.  Debilitation and resolving encephalopathy secondary to GI bleed status post epi injection and cauterization/multi-medical 2.  DVT Prophylaxis/Anticoagulation: Superficial thrombus right basilic vein and left cephalic vein. No anticoagulation at this time due to GI bleed. Consider follow-up Doppler study. 3. Pain Management: Low-dose oxycodone 5 mg every 4 hours as needed. Monitor mental status 4. Seizure prophylaxis. Depakote 750 mg every 12 hours. 5. Neuropsych: This patient is capable of making decisions on his own behalf. 6. Skin/Wound Care: Routine skin checks 7. Fluids/Electrolytes/Nutrition: Routine I&O's with follow-up chemistries 8. CAD status post CABG. No chest pain or shortness of breath. 9. History of atrial fibrillation. Coumadin remains on hold due to GI bleed consider resuming in TWO WEEKS IF NO FURTHER BLEEDING versus aspirin. Continue Lanoxin 0.0625 mg daily 10. Systolic diastolic congestive heart failure. Monitor for any signs of fluid  overload. 11. Escherichia coli urinary tract infection. Complete Rocephin Through 12/16/2015 and stop 12. Gout. Zyloprim 300 mg daily 13. Hyperlipidemia. Pravachol  Post Admission Physician Evaluation: 1. Functional deficits secondary  to debility/encephalopahty. 2. Patient is admitted to receive collaborative, interdisciplinary care between the physiatrist, rehab nursing staff, and therapy team. 3. Patient's level of medical complexity and substantial therapy needs in context of that medical necessity cannot be provided at a lesser intensity of care such as a SNF. 4. Patient has experienced substantial functional loss from his/her baseline which was documented above under the "Functional  History" and "Functional Status" headings.  Judging by the patient's diagnosis, physical exam, and functional history, the patient has potential for functional progress which will result in measurable gains while on inpatient rehab.  These gains will be of substantial and practical use upon discharge  in facilitating mobility and self-care at the household level. 5. Physiatrist will provide 24 hour management of medical needs as well as oversight of the therapy plan/treatment and provide guidance as appropriate regarding the interaction of the two. 6. 24 hour rehab nursing will assist with bladder management, bowel management, safety, skin/wound care, disease management, medication administration, pain management and patient education  and help integrate therapy concepts, techniques,education, etc. 7. PT will assess and treat for/with: Lower extremity strength, range of motion, stamina, balance, functional mobility, safety, adaptive techniques and equipment, NMR, home safety, pain mgt, ego support.   Goals are: supervision to mod I. 8. OT will assess and treat for/with: ADL's, functional mobility, safety, upper extremity strength, adaptive techniques and equipment, NMR, education, ego support.   Goals are: mod I to supervision. Therapy may proceed with showering this patient. 9. SLP will assess and treat for/with: cognition, communication.  Goals are: mod I. 10. Case Management and Social Worker will assess and treat for psychological issues and discharge planning. 11. Team conference will be held weekly to assess progress toward goals and to determine barriers to discharge. 12. Patient will receive at least 3 hours of therapy per day at least 5 days per week. 13. ELOS: 7-10 days       14. Prognosis:  excellent     Meredith Staggers, MD, [facility] 12/15/2015   12/15/2015

## 2015-12-15 NOTE — Progress Notes (Addendum)
Patient ID: [name], male   DOB: [birth year], 82 y.o.   MRN: [id number]  PROGRESS NOTE    [name]  [id number] DOB: June 07, 1925 DOA: 12/12/2015  PCP: Donnajean Lopes, MD  Outpatient Specialists: Cardiology, Dr. Crissie Sickles  Brief Narrative:   82 y.o. male with medical history significant for atrial fibrillation on Coumadin, CAD status post CABG, chronic kidney disease stage III, chronic systolic CHF, depression and anxiety, just discharged to inpatient rehabilitation 12/11/2015 after his hospitalization for GI bleed. He had tagged red blood cell study and sigmoidoscopy with identification of a bleeding rectal ulcer. He has received total of 8 units PRBC during that hospital stay. Patient transferred from rehabilitation to the telemetry floor 12/12/2015 after he was found to be unresponsive. Patient was unable to speak and the only thing he was able to do his blink eyes. Unable to do MRI because patient has pacemaker. The CT scan did not show acute intracranial findings. Neurology saw the patient in consultation and recommended starting Depakote.   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 Moderate stenosis bilateral vertebral artery origins which are otherwise widely patent - Unable to do MRI because patient has pacemaker - He was started on Keppra for possible seizures. Per neurology, Keppra changed to Depakote 4/30 due to suspicion of seizure considering his presentation on admission. EEG did not demonstrated seizure activity.  - Palliative care consulted for goals of care, we appreciate their input  - Spoke with inpatient rehabilitation coordinator, patient does not seem to be a candidate at this time for inpatient rehabilitation. I spoke with patient's wife who was  agreeable with skilled nursing facility placement on discharge.  Hospital Course:  Escherichia coli UTI. - Continue Rocephin - would continue through 12/16/2015 which would be total of 5 days of IV Rocephin.  Chronic systolic and diastolic CHF, NYHA class 3/ Ischemic cardiomyopathy - 2-D echo 4/24 showed EF of 30-35%. PA pressure 64. - Respiratory status stable - Compensated  Chronic atrial fibrillation, complete heart block - [place] VASC 6 - Hemoglobin remains stable, 9.3 - Please note Coumadin was held even on prior admission due to risk of bleed  - Heart rate controlled with digoxin 0.0625 mg daily  Dyslipidemia - Continue Pravachol 20 mg at bedtime  Chronic kidney disease stage III - Baseline creatinine from November 2015 was 3.24 and on this admission, creatinine stable around 1.4  Anemia of chronic kidney disease - Hemoglobin is stable at 9.3   DVT prophylaxis: SCDs bilaterally due to risk of bleeding Code Status: full code  Family Communication: Spoke with the patient's wife over the phone 5/2; she is in agreement with surgical skilled nursing facility, possible [facility] Disposition Plan: Not a candidate for CIR at this time so will see if can start search for SNF placement   Consultants:   Palliative care  Neurology  Procedures:   EEG 4/29 - No seizure activity noted.  Antimicrobials:   Rocephin 12/12/2015 -->   Subjective: No restlessness. No overnight events.   Objective: Filed Vitals:   12/14/15 2315 12/15/15 0330 12/15/15 0335 12/15/15 0806  BP: 109/63  98/65   Pulse: 89  89   Temp: 98.7 F (37.1 C) 97.7 F (36.5 C)  98 F (36.7 C)  TempSrc: Axillary Axillary  Oral  Resp: 23  18   Height:      Weight:      SpO2: 100%  100%     Intake/Output Summary (Last 24 hours) at 12/15/15 1124 Last data filed at 12/15/15 [id number]  Gross per 24 hour  Intake     50 ml  Output    700 ml  Net   -650 ml   Filed Weights   12/12/15 0137 12/13/15 0748    Weight: 50.122 kg (110 lb 8 oz) 50 kg (110 lb 3.7 oz)    Examination:  General exam: Appears calm, no distress  Respiratory system: bilateral air entry, no wheezing Cardiovascular system: S1 & S2 (+), RRR Gastrointestinal system: (+) BS, non tender Central nervous system: awake, more alert, no focal deficits  Extremities: no tenderness, palpable pulses  Skin: scattered ecchymoses   Psychiatry: normal mood and behavior, not agitated   Data Reviewed: I have personally reviewed following labs and imaging studies  CBC:  Recent Labs Lab 12/09/15 0453 12/10/15 0308 12/11/15 0224 12/11/15 2219 12/12/15 0631 12/14/15 2118  WBC 19.9* 20.2* 16.4* 17.4*  --  9.4  NEUTROABS  --   --   --  14.4*  --   --   HGB 8.3* 8.6* 8.5* 8.8*  --  9.3*  HCT 24.1* 25.7* 25.7* 26.5* 26.6* 28.9*  MCV 88.3 89.2 90.5 92.7  --  95.4  PLT 130* 192 199 214  --  [id number]   Basic Metabolic Panel:  Recent Labs Lab 12/09/15 0453 12/10/15 0308 12/11/15 0224 12/11/15 2219 12/14/15 2118  NA 128* 133* 135 133* 133*  K 5.0 4.7 4.3 4.5 4.8  CL 100* 101 100* 99* 102  CO2 18* 21* 23 24 21*  GLUCOSE 153* 203* 160* 115* 71  BUN 60* 64* 57* 61* 37*  CREATININE 1.74* 1.53* 1.46* 1.50* 1.41*  CALCIUM 7.7* 7.8* 8.0* 7.8* 7.6*  MG  --   --   --   --  2.0   GFR: Estimated Creatinine Clearance: 25.6 mL/min (by C-G formula based on Cr of 1.41). Liver Function Tests:  Recent Labs Lab 12/14/15 2118  AST 36  ALT 40  ALKPHOS 90  BILITOT 0.5  PROT 4.4*  ALBUMIN 2.4*   No results for input(s): LIPASE, AMYLASE in the last 168 hours.  Recent Labs Lab 12/12/15 0632  AMMONIA 23   Coagulation Profile:  Recent Labs Lab 12/09/15 0453 12/10/15 0308 12/11/15 0224  INR 1.65* 1.66* 1.47   Cardiac Enzymes:  Recent Labs Lab 12/11/15 2219 12/12/15 0631  TROPONINI 0.19* 0.19*   BNP (last 3 results) No results for input(s): PROBNP in the last 8760 hours. HbA1C: No results for input(s): HGBA1C in the last  72 hours. CBG:  Recent Labs Lab 12/14/15 1455 12/14/15 1914 12/14/15 2337 12/15/15 0334 12/15/15 0803  GLUCAP 171* 135* 114* 94 96   Lipid Profile: No results for input(s): CHOL, HDL, LDLCALC, TRIG, CHOLHDL, LDLDIRECT in the last 72 hours. Thyroid Function Tests: No results for input(s): TSH, T4TOTAL, FREET4, T3FREE, THYROIDAB in the last 72 hours. Anemia Panel: No results for input(s): VITAMINB12, FOLATE, FERRITIN, TIBC, IRON, RETICCTPCT in the last 72 hours. Urine analysis:    Component Value Date/Time   COLORURINE YELLOW 12/15/2015 [place] 12/15/2015 0255   LABSPEC 1.017 12/15/2015 0255   PHURINE 6.5 12/15/2015 0255   GLUCOSEU NEGATIVE 12/15/2015 0255   HGBUR NEGATIVE 12/15/2015 0255   BILIRUBINUR NEGATIVE 12/15/2015 0255   KETONESUR NEGATIVE 12/15/2015 0255  PROTEINUR NEGATIVE 12/15/2015 0255   UROBILINOGEN 0.2 06/28/2014 1401   NITRITE NEGATIVE 12/15/2015 0255   LEUKOCYTESUR NEGATIVE 12/15/2015 0255   Sepsis Labs: @LABRCNTIP (procalcitonin:4,lacticidven:4)  Recent Results (from the past 240 hour(s))  Culture, blood (routine x 2)     Status: None (Preliminary result)   Collection Time: 12/12/15 12:25 AM  Result Value Ref Range Status   Specimen Description BLOOD RIGHT HAND  Final   Special Requests IN PEDIATRIC BOTTLE 1CC  Final   Culture NO GROWTH 3 DAYS  Final   Report Status PENDING  Incomplete  Culture, blood (routine x 2)     Status: None (Preliminary result)   Collection Time: 12/12/15 12:40 AM  Result Value Ref Range Status   Specimen Description BLOOD LEFT HAND  Final   Special Requests IN PEDIATRIC BOTTLE 1CC  Final   Culture NO GROWTH 3 DAYS  Final   Report Status PENDING  Incomplete      Radiology Studies: Ct Angio Head W/cm &/or Wo Cm 12/13/2015  CT HEAD: No acute intracranial process. No abnormal intracranial enhancement. 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 Otherwise  negative CT HEAD for age. 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 Moderate stenosis bilateral vertebral artery origins which are otherwise widely patent. CTA HEAD: Retrograde flow to LEFT carotid terminus with complete circle of Willis. No emergent large vessel occlusion or high-grade stenosis. Mild luminal irregularity of the intracranial vessels compatible with atherosclerosis. Electronically Signed   By: Elon Alas M.D.   On: 12/13/2015 02:17   Ct Angio Neck W/cm &/or Wo/cm 12/13/2015   CT HEAD: No acute intracranial process. No abnormal intracranial enhancement. 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 Otherwise negative CT HEAD for age. 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 Moderate stenosis bilateral vertebral artery origins which are otherwise widely patent. CTA HEAD: Retrograde flow to LEFT carotid terminus with complete circle of Willis. No emergent large vessel occlusion or high-grade stenosis. Mild luminal irregularity of the intracranial vessels compatible with atherosclerosis.   Ct Head Wo Contrast 12/11/2015   1. Stable since prior.  No acute intracranial finding. 2. Nondisplaced left facial fractures as described previously. Electronically Signed   By: Monte Fantasia M.D.   On: 12/11/2015 23:33   Dg Chest Port 1 View 12/11/2015  1. Right pleural effusion with probable subsegmental atelectasis in the right lower lobe. 2. Mild cardiomegaly. 3. Atherosclerosis. Electronically Signed   By: Vinnie Langton M.D.   On: 12/11/2015 22:43    Scheduled Meds: . acetaminophen  1,000 mg Oral QID  . allopurinol  300 mg Oral Daily  . antiseptic oral rinse  7 mL Mouth Rinse BID  . artificial tears   Both Eyes QHS  . cefTRIAXone (ROCEPHIN)  IV  1 g Intravenous Q24H  . digoxin  0.0625 mg Oral Daily  .  divalproex  750 mg Oral Q12H  . insulin aspart  0-9 Units Subcutaneous Q4H  . pravastatin  20 mg Oral q1800   Continuous Infusions:    LOS: 3 days    Time spent: 25 minutes    Leisa Lenz, MD [facility] Pager [phone]  If 7PM-7AM, please contact night-coverage [email or web address] Password [facility] 12/15/2015, 11:24 AM

## 2015-12-15 NOTE — Discharge Instructions (Signed)
Seizure, Adult °A seizure means there is unusual activity in the brain. A seizure can cause changes in attention or behavior. Seizures often cause shaking (convulsions). Seizures often last from 30 seconds to 2 minutes. °HOME CARE  °· If you are given medicines, take them exactly as told by your doctor. °· Keep all doctor visits as told. °· Do not swim or drive until your doctor says it is okay. °· Teach others what to do if you have a seizure. They should: °¨ Lay you on the ground. °¨ Put a cushion under your head. °¨ Loosen any tight clothing around your neck. °¨ Turn you on your side. °¨ Stay with you until you get better. °GET HELP RIGHT AWAY IF:  °· The seizure lasts longer than 2 to 5 minutes. °· The seizure is very bad. °· The person does not wake up after the seizure. °· The person's attention or behavior changes. °Drive the person to the emergency room or call your local emergency services (911 in [place] °MAKE SURE YOU:  °· Understand these instructions. °· Will watch your condition. °· Will get help right away if you are not doing well or get worse. °  °This information is not intended to replace advice given to you by your health care provider. Make sure you discuss any questions you have with your health care provider. °  °Document Released: 01/18/2008 Document Revised: 10/24/2011 Document Reviewed: 03/13/2013 °Elsevier Interactive Patient Education ©2016 [employer] ° °

## 2015-12-15 NOTE — Clinical Social Work Note (Signed)
CSW acknowledges social work consult. Patient will be going back to CIR instead of a SNF.   CSW will sign off. Consult if any further social work needs arise.  [place] Scrape, [place]

## 2015-12-15 NOTE — Progress Notes (Addendum)
Rehab admissions - I met with patient this am and he looks much better.  I spoke with wife on the phone.  She is agreeable to inpatient rehab.  I will check with attending MD to see if patient is ready for acute inpatient rehab today.  Call me for questions.  #[phone]  Note:  I spoke with Dr. Charlies Silvers and she has cleared patient for acute inpatient rehab admission for today.  Bed available and will admit to rehab today.  Call me for questions.  #[phone]

## 2015-12-15 NOTE — PMR Pre-admission (Signed)
PMR Admission Coordinator Pre-Admission Assessment  Patient: [name] is an 82 y.o., male MRN: [id number] DOB: 1924-10-11 Height: 5\' 10"  (177.8 cm) Weight: 50 kg (110 lb 3.7 oz)              Insurance Information HMO: No   PPO:       PCP:       IPA:       80/20:       OTHER:   PRIMARY:  Medicare A/B      Policy#: [id number]      Subscriber: Barbaraann Rondo CM Name:        Phone#:       Fax#:   Pre-Cert#:        Employer:  Retired Benefits:  Phone #:       Name: Checked in [place] Eff. Date: 01/14/92     Deduct: $1316      Out of Pocket Max: none      Life Max: unlimited CIR: 100%      SNF: 100 days Outpatient: 80%     Co-Pay: 20% Home Health: 100%      Co-Pay: none DME: 80%     Co-Pay: 20% Providers: patient's choice  SECONDARY:  C[employer] indemnity      Policy#: [id number]      Subscriber: Barbaraann Rondo CM Name:        Phone#:       Fax#:   Pre-Cert#:        Employer:  Retired Benefits:  Phone #: [phone]     Name:   Eff. Date:       Deduct:        Out of Pocket Max:        Life Max:   CIR:        SNF:   Outpatient:       Co-Pay:   Home Health:        Co-Pay:   DME:       Co-Pay:    Emergency Contact Information Contact Information    Name Relation Home Work Mobile   [place] Spouse [phone]  [phone]     Current Medical History  Patient Admitting Diagnosis: Debility/GI bleed/Fall, mental status changes  History of Present Illness: An 82 y.o. right handed male with history of atrial fibrillation maintained on chronic Coumadin, coronary artery disease/Pacemaker status post CABG, systolic and diastolic congestive heart failure, type 2 diabetes mellitus, bladder cancer. Lives with wife in [place]. Reports to be independent prior to admission with a general decline over the past few months.Recent fall and hit the corner of his eye on the sink. Presented 12/05/2015 with bright red blood from the rectum as well as a recent fall from the toilet approximately  1 week ago. Patient reports some recent constipation 2 weeks ago but denied any nausea vomiting or abdominal pain. Patient with ongoing active bleeding in the ED. Hemoglobin 6.5. INR 4.86. EKG showed new ST changes and T-wave inversions in the anterior leads. Troponin 0.20. Patient did receive vitamin K fresh frozen plasma 2 units and 1 unit of packed red blood cells. Gastroenterology services consulted Dr. Carlean Purl with findings of a single solitary ulcer in the rectum and in the distal rectum. Underwent epi injection and then cauterization. Close monitoring of hemoglobin status post PRBCs 8 with latest hemoglobin 8.3. Patient's chronic Coumadin currently remains on hold and presently on aspirin.Vascular study bilateral upper extremities 12/10/2015 showed superficial thrombus noted in the right basilic  vein and in the left cephalic vein. He was placed on subcutaneous heparin for DVT prophylaxis and monitored. Current plan is to remain off Coumadin for 2 weeks per gastroenterology services. Follow-up cardiology services with echocardiogram showing ejection fraction of 35%. Akinesis of the anterior inferior lateral and apical myocardium. Placed on intravenous Lasix for acute on chronic systolic and diastolic heart failure and transition to by mouth. Close monitoring of creatinine 1.89-2.43. Tolerating a regular diet. Urine culture Escherichia coli presently maintained on Rocephin until 12/16/2015. Physical therapy evaluation completed 12/08/2015 for debilitation with recommendations of physical medicine rehabilitation consult. Patient was admitted for a comprehensive rehabilitation program 12/09/2015. Patient was slow progressive gains. On the evening of 12/11/2015 noted waxing and waning altered mental status and transient episode of being unresponsive. Cranial CT scan negative. Patient was discharged to acute care services for ongoing workup. Neurology was consulted. EEG negative for seizure. Neurology was still  concern of possible seizure clinically and was placed on Depakote. 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 CTA of head showed no emergent large vessel occlusion or high-grade stenosis. Subcutaneous heparin the patient had been initially placed on for superficial thrombus right basilic vein and left cephalic vein had been discontinued due to risk of bleeding. Mental status continued to improve. Palliative care was consult for goals of care. Diet was downgraded to a mechanical soft nectar thick liquid. Therapies resumed 12/14/2015. He was able to stand and walk short distances. Patient to be readmitted for comprehensive inpatient rehabilitation program.     Past Medical History  Past Medical History  Diagnosis Date  . Atrial fibrillation (HCC)     Chronic  . Coronary artery disease 2004    Status post CABG with LIMA-LAD, SVG-CFX. Hx of anterior apical infarct.  . Renal insufficiency     Chronic  . Hypertension   . Hyperlipidemia   . Gout   . CHF (congestive heart failure) (HCC)     with chronic ischemic cardiomypathy. EF of 10-15%.  CHF due to systolic dysfunction. Clinically doing well.  [name] GERD (gastroesophageal reflux disease)   . Pacemaker     Medtronic biventricular pacemaker, serial number [id number]  . Type II diabetes mellitus ([place])     "been off my RX for ~ 2 yr" (03/20/2013)  . H/O hiatal hernia   . Arthritis     "right thumb" (03/20/2013)  . Depression   . Anxiety   . Bladder cancer ([place])     "low grade; grade 1; non-invasive" (03/20/2013)  . CKD (chronic kidney disease), stage III     Family History  family history includes Heart attack in his mother.  Prior Rehab/Hospitalizations: No previous rehab admissions.  Has the patient had major surgery during 100 days prior to admission? No  Current Medications   Current facility-administered medications:  .  acetaminophen (TYLENOL) tablet  1,000 mg, 1,000 mg, Oral, QID, Acquanetta Chain, DO, 1,000 mg at 12/15/15 [id number] .  acetaminophen (TYLENOL) tablet 650 mg, 650 mg, Oral, Q6H PRN, Dianne Dun, NP, 650 mg at 12/14/15 1129 .  allopurinol (ZYLOPRIM) tablet 300 mg, 300 mg, Oral, Daily, Robbie Lis, MD, 300 mg at 12/15/15 0917 .  ALPRAZolam Duanne Moron) tablet 1 mg, 1 mg, Oral, QHS PRN, Acquanetta Chain, DO .  antiseptic oral rinse (CPC / CETYLPYRIDINIUM CHLORIDE 0.05%) solution 7 mL, 7 mL, Mouth Rinse, BID, Robbie Lis, MD, 7 mL at 12/15/15  1000 .  artificial tears (LACRILUBE) ophthalmic ointment, , Both Eyes, QHS, Acquanetta Chain, DO .  cefTRIAXone (ROCEPHIN) 1 g in dextrose 5 % 50 mL IVPB, 1 g, Intravenous, Q24H, Erenest Blank, RPH, 1 g at 12/15/15 0919 .  digoxin (LANOXIN) tablet 0.0625 mg, 0.0625 mg, Oral, Daily, Robbie Lis, MD, 0.0625 mg at 12/15/15 1020 .  divalproex (DEPAKOTE) DR tablet 750 mg, 750 mg, Oral, Q12H, Greta Doom, MD, 750 mg at 12/15/15 1019 .  haloperidol lactate (HALDOL) injection 2 mg, 2 mg, Intravenous, Q6H PRN, Robbie Lis, MD, 2 mg at 12/13/15 0027 .  insulin aspart (novoLOG) injection 0-9 Units, 0-9 Units, Subcutaneous, Q4H, Vianne Bulls, MD, 3 Units at 12/15/15 1216 .  ipratropium (ATROVENT) 0.06 % nasal spray 1-2 spray, 1-2 spray, Each Nare, TID PRN, Ilene Qua Opyd, MD .  oxyCODONE (Oxy IR/ROXICODONE) immediate release tablet 5 mg, 5 mg, Oral, Q4H PRN, Acquanetta Chain, DO .  pravastatin (PRAVACHOL) tablet 20 mg, 20 mg, Oral, q1800, Robbie Lis, MD, 20 mg at 12/14/15 1834 .  RESOURCE THICKENUP CLEAR, , Oral, PRN, Robbie Lis, MD  Patients Current Diet: DIET DYS 3 Room service appropriate?: Yes; Fluid consistency:: Nectar Thick Diet - low sodium heart healthy  Precautions / Restrictions Precautions Precautions: Fall Precaution Comments: Pt is unsteady on his feet.  Restrictions Weight Bearing Restrictions: No   Has the patient had 2 or more falls or a fall  with injury in the past year?Yes.  Has suffered 2 falls with injury.  Prior Activity Level Limited Community (1-2x/wk): Went out to eat and to grocery store about 3 X a week.  Home Assistive Devices / Equipment Home Assistive Devices/Equipment: None Home Equipment: Cane - single point, Shower seat - built in  Prior Device Use: Indicate devices/aids used by the patient prior to current illness, exacerbation or injury? None, refused to use a cane or any device.  Prior Functional Level Prior Function Level of Independence: Independent Comments: still drives, had dog, "Mickey"  Self Care: Did the patient need help bathing, dressing, using the toilet or eating?  Independent  Indoor Mobility: Did the patient need assistance with walking from room to room (with or without device)? Independent  Stairs: Did the patient need assistance with internal or external stairs (with or without device)? Independent  Functional Cognition: Did the patient need help planning regular tasks such as shopping or remembering to take medications? Independent  Current Functional Level Cognition  Overall Cognitive Status: Impaired/Different from baseline Orientation Level: Oriented to person, Oriented to place, Oriented to time, Disoriented to situation    Extremity Assessment (includes Sensation/Coordination)  Upper Extremity Assessment: Generalized weakness  Lower Extremity Assessment: Generalized weakness    ADLs       Mobility  Overal bed mobility: Needs Assistance Bed Mobility: Supine to Sit Supine to sit: Min assist, HOB elevated General bed mobility comments: Min assist to support trunk and help progresss legs over EOB with HOB elevated.  Pt using bed rail with cues and manual assist to find bedrail from therapist.     Transfers  Overall transfer level: Needs assistance Equipment used: Rolling walker (2 wheeled) Transfers: Sit to/from Stand Sit to Stand: Min assist General transfer comment:  Min assist to support trunk during transition to stand over weak legs.     Ambulation / Gait / Stairs / Wheelchair Mobility  Ambulation/Gait Ambulation/Gait assistance: Museum/gallery curator (Feet): 50 Feet Assistive device: Rolling walker (2 wheeled)  Gait Pattern/deviations: Step-through pattern, Shuffle, Trunk flexed General Gait Details: Pt with slow, shuffling gait speed, flexed over RW.      Posture / Balance Balance Overall balance assessment: Needs assistance Sitting-balance support: Feet supported, Bilateral upper extremity supported Sitting balance-Leahy Scale: Fair Standing balance support: Bilateral upper extremity supported Standing balance-Leahy Scale: Poor    Special needs/care consideration BiPAP/CPAP No CPM No Continuous Drip IV No Dialysis No     Life Vest No Oxygen No Special Bed No Trach Size No Wound Vac (area) No      Skin Has bruising on left face and on both lower extremities                              Bowel mgmt: Last BM 12/15/15 with incontinence Bladder mgmt: Voiding with incontinence Diabetic mgmt Yes, but has been off medications for the past 3 years.    Previous Home Environment Living Arrangements: Spouse/significant other Available Help at Discharge: Family, Available 24 hours/day (wife is 20 years yonger than pt) Type of Home: House Home Layout: One level Home Access: Stairs to enter Entrance Stairs-Rails: Left Entrance Stairs-Number of Steps: 3 Bathroom Shower/Tub: Gaffer, Charity fundraiser: Handicapped height Bathroom Accessibility: Yes Home Care Services: No Additional Comments: Pt was independent PTA and still drove  Discharge Living Setting Plans for Discharge Living Setting: Patient's home, House, Lives with (comment) (Lives with his wife and their dog, Dimple Nanas, a sheltie.) Type of Home at Discharge: House Discharge Home Layout: One level Discharge Home Access: Stairs to enter Entrance Stairs-Number of  Steps: 3 steps entry Does the patient have any problems obtaining your medications?: No  Social/Family/Support Systems Patient Roles: Spouse, Parent (Has a wife and a son.) Contact Information: Tobie Poet - wife Anticipated Caregiver: Wife Anticipated Caregiver's Contact Information: [name] - wife (h) [phone] (c) [phone] Ability/Limitations of Caregiver: Wife can assist Caregiver Availability: 24/7 Discharge Plan Discussed with Primary Caregiver: Yes Is Caregiver In Agreement with Plan?: Yes Does Caregiver/Family have Issues with Lodging/Transportation while Pt is in Rehab?: No  Goals/Additional Needs Patient/Family Goal for Rehab: PT/OT/SLP mod I goals Expected length of stay: 7 days Cultural Considerations: Christian Dietary Needs: Dys 3, nectar thick liquids Equipment Needs: TBD Additional Information: Wife needs to go to the beach to see about their home for a few days as soon as she can. Pt/Family Agrees to Admission and willing to participate: Yes Program Orientation Provided & Reviewed with Pt/Caregiver Including Roles  & Responsibilities: Yes  Decrease burden of Care through IP rehab admission: N/A  Possible need for SNF placement upon discharge: Not planned  Patient Condition: This patient's medical and functional status has changed since the consult dated: Original consult done on 12/09/15 in which the Rehabilitation Physician determined and documented that the patient's condition is appropriate for intensive rehabilitative care in an inpatient rehabilitation facility. Patient was admitted to inpatient rehab, but quickly had changes which resulted in return to the acute hospital. See "History of Present Illness" (above) for medical update. Functional changes are: Currently min assist 50 feet RW and looked much better today. Patient's medical and functional status update has been discussed with the Rehabilitation physician and patient remains appropriate for inpatient  rehabilitation. Will admit to inpatient rehab today.  Preadmission Screen Completed By:  Retta Diones, 12/15/2015 2:24 PM ______________________________________________________________________   Discussed status with Dr. Naaman Plummer on 12/15/15 at 1424 and received telephone approval for admission today.  Admission  Coordinator:  Retta Diones, time1424/Date05/02/17

## 2015-12-15 NOTE — Progress Notes (Signed)
Retta Diones, RN Rehab Admission Coordinator Signed Physical Medicine and Rehabilitation PMR Pre-admission 12/15/2015 2:12 PM  Related encounter: Admission (Current) from 12/12/2015 in [place] Collapse All   PMR Admission Coordinator Pre-Admission Assessment  Patient: [name] is an 82 y.o., male MRN: [id number] DOB: [name] 27, 1925 Height: 5\' 10"  (177.8 cm) Weight: 50 kg (110 lb 3.7 oz)  Insurance Information HMO: No PPO: PCP: IPA: 80/20: OTHER:  PRIMARY: Medicare A/B Policy#: [id number] Subscriber: Barbaraann Rondo CM Name: Phone#: Fax#:  Pre-Cert#: Employer: Retired Benefits: Phone #: Name: Checked in palmetto Eff. Date: 01/14/92 Deduct: $1316 Out of Pocket Max: none Life Max: unlimited CIR: 100% SNF: 100 days Outpatient: 80% Co-Pay: 20% Home Health: 100% Co-Pay: none DME: 80% Co-Pay: 20% Providers: patient's choice  SECONDARY: [employer] indemnity Policy#: [id number] Subscriber: Barbaraann Rondo CM Name: Phone#: Fax#:  Pre-Cert#: Employer: Retired Benefits: Phone #: [phone] Name:  Eff. Date: Deduct: Out of Pocket Max: Life Max:  CIR: SNF:  Outpatient: Co-Pay:  Home Health: Co-Pay:  DME: Co-Pay:   Emergency Contact Information Contact Information    Name Relation Home Work Mobile   [place] Spouse [phone]  [phone]     Current Medical History  Patient Admitting Diagnosis: Debility/GI bleed/Fall, mental status changes  History of Present Illness: An 82 y.o. right handed male with history of atrial fibrillation maintained on chronic Coumadin, coronary artery disease/Pacemaker  status post CABG, systolic and diastolic congestive heart failure, type 2 diabetes mellitus, bladder cancer. Lives with wife in [place]. Reports to be independent prior to admission with a general decline over the past few months.Recent fall and hit the corner of his eye on the sink. Presented 12/05/2015 with bright red blood from the rectum as well as a recent fall from the toilet approximately 1 week ago. Patient reports some recent constipation 2 weeks ago but denied any nausea vomiting or abdominal pain. Patient with ongoing active bleeding in the ED. Hemoglobin 6.5. INR 4.86. EKG showed new ST changes and T-wave inversions in the anterior leads. Troponin 0.20. Patient did receive vitamin K fresh frozen plasma 2 units and 1 unit of packed red blood cells. Gastroenterology services consulted Dr. Carlean Purl with findings of a single solitary ulcer in the rectum and in the distal rectum. Underwent epi injection and then cauterization. Close monitoring of hemoglobin status post PRBCs 8 with latest hemoglobin 8.3. Patient's chronic Coumadin currently remains on hold and presently on aspirin.Vascular study bilateral upper extremities 12/10/2015 showed superficial thrombus noted in the right basilic vein and in the left cephalic vein. He was placed on subcutaneous heparin for DVT prophylaxis and monitored. Current plan is to remain off Coumadin for 2 weeks per gastroenterology services. Follow-up cardiology services with echocardiogram showing ejection fraction of 35%. Akinesis of the anterior inferior lateral and apical myocardium. Placed on intravenous Lasix for acute on chronic systolic and diastolic heart failure and transition to by mouth. Close monitoring of creatinine 1.89-2.43. Tolerating a regular diet. Urine culture Escherichia coli presently maintained on Rocephin until 12/16/2015. Physical therapy evaluation completed 12/08/2015 for debilitation with recommendations of physical medicine  rehabilitation consult. Patient was admitted for a comprehensive rehabilitation program 12/09/2015. Patient was slow progressive gains. On the evening of 12/11/2015 noted waxing and waning altered mental status and transient episode of being unresponsive. Cranial CT scan negative. Patient was discharged to acute care services for ongoing workup. Neurology was consulted. EEG negative for seizure. Neurology was still concern of possible  seizure clinically and was placed on Depakote. 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 CTA of head showed no emergent large vessel occlusion or high-grade stenosis. Subcutaneous heparin the patient had been initially placed on for superficial thrombus right basilic vein and left cephalic vein had been discontinued due to risk of bleeding. Mental status continued to improve. Palliative care was consult for goals of care. Diet was downgraded to a mechanical soft nectar thick liquid. Therapies resumed 12/14/2015. He was able to stand and walk short distances. Patient to be readmitted for comprehensive inpatient rehabilitation program.   Past Medical History  Past Medical History  Diagnosis Date  . Atrial fibrillation (HCC)     Chronic  . Coronary artery disease 2004    Status post CABG with LIMA-LAD, SVG-CFX. Hx of anterior apical infarct.  . Renal insufficiency     Chronic  . Hypertension   . Hyperlipidemia   . Gout   . CHF (congestive heart failure) (HCC)     with chronic ischemic cardiomypathy. EF of 10-15%. CHF due to systolic dysfunction. Clinically doing well.  [name] GERD (gastroesophageal reflux disease)   . Pacemaker     Medtronic biventricular pacemaker, serial number [id number]  . Type II diabetes mellitus ([place])     "been off my RX for ~ 2 yr" (03/20/2013)  . H/O hiatal hernia   . Arthritis     "right thumb"  (03/20/2013)  . Depression   . Anxiety   . Bladder cancer ([place])     "low grade; grade 1; non-invasive" (03/20/2013)  . CKD (chronic kidney disease), stage III     Family History  family history includes Heart attack in his mother.  Prior Rehab/Hospitalizations: No previous rehab admissions.  Has the patient had major surgery during 100 days prior to admission? No  Current Medications   Current facility-administered medications:  . acetaminophen (TYLENOL) tablet 1,000 mg, 1,000 mg, Oral, QID, Acquanetta Chain, DO, 1,000 mg at 12/15/15 [id number] . acetaminophen (TYLENOL) tablet 650 mg, 650 mg, Oral, Q6H PRN, Dianne Dun, NP, 650 mg at 12/14/15 1129 . allopurinol (ZYLOPRIM) tablet 300 mg, 300 mg, Oral, Daily, Robbie Lis, MD, 300 mg at 12/15/15 0917 . ALPRAZolam (XANAX) tablet 1 mg, 1 mg, Oral, QHS PRN, Acquanetta Chain, DO . antiseptic oral rinse (CPC / CETYLPYRIDINIUM CHLORIDE 0.05%) solution 7 mL, 7 mL, Mouth Rinse, BID, Robbie Lis, MD, 7 mL at 12/15/15 1000 . artificial tears (LACRILUBE) ophthalmic ointment, , Both Eyes, QHS, Acquanetta Chain, DO . cefTRIAXone (ROCEPHIN) 1 g in dextrose 5 % 50 mL IVPB, 1 g, Intravenous, Q24H, Erenest Blank, RPH, 1 g at 12/15/15 0919 . digoxin (LANOXIN) tablet 0.0625 mg, 0.0625 mg, Oral, Daily, Robbie Lis, MD, 0.0625 mg at 12/15/15 1020 . divalproex (DEPAKOTE) DR tablet 750 mg, 750 mg, Oral, Q12H, Greta Doom, MD, 750 mg at 12/15/15 1019 . haloperidol lactate (HALDOL) injection 2 mg, 2 mg, Intravenous, Q6H PRN, Robbie Lis, MD, 2 mg at 12/13/15 0027 . insulin aspart (novoLOG) injection 0-9 Units, 0-9 Units, Subcutaneous, Q4H, Vianne Bulls, MD, 3 Units at 12/15/15 1216 . ipratropium (ATROVENT) 0.06 % nasal spray 1-2 spray, 1-2 spray, Each Nare, TID PRN, Ilene Qua Opyd, MD . oxyCODONE (Oxy IR/ROXICODONE) immediate release tablet 5 mg, 5 mg, Oral, Q4H PRN, Acquanetta Chain, DO .  pravastatin (PRAVACHOL) tablet 20 mg, 20 mg, Oral, q1800, Dedra Skeens  Vivi Ferns, MD, 20 mg at 12/14/15 1834 . RESOURCE THICKENUP CLEAR, , Oral, PRN, Robbie Lis, MD  Patients Current Diet: DIET DYS 3 Room service appropriate?: Yes; Fluid consistency:: Nectar Thick Diet - low sodium heart healthy  Precautions / Restrictions Precautions Precautions: Fall Precaution Comments: Pt is unsteady on his feet.  Restrictions Weight Bearing Restrictions: No   Has the patient had 2 or more falls or a fall with injury in the past year?Yes. Has suffered 2 falls with injury.  Prior Activity Level Limited Community (1-2x/wk): Went out to eat and to grocery store about 3 X a week.  Home Assistive Devices / Equipment Home Assistive Devices/Equipment: None Home Equipment: Cane - single point, Shower seat - built in  Prior Device Use: Indicate devices/aids used by the patient prior to current illness, exacerbation or injury? None, refused to use a cane or any device.  Prior Functional Level Prior Function Level of Independence: Independent Comments: still drives, had dog, "Mickey"  Self Care: Did the patient need help bathing, dressing, using the toilet or eating? Independent  Indoor Mobility: Did the patient need assistance with walking from room to room (with or without device)? Independent  Stairs: Did the patient need assistance with internal or external stairs (with or without device)? Independent  Functional Cognition: Did the patient need help planning regular tasks such as shopping or remembering to take medications? Independent  Current Functional Level Cognition  Overall Cognitive Status: Impaired/Different from baseline Orientation Level: Oriented to person, Oriented to place, Oriented to time, Disoriented to situation   Extremity Assessment (includes Sensation/Coordination)  Upper Extremity Assessment: Generalized weakness  Lower Extremity Assessment: Generalized weakness     ADLs       Mobility  Overal bed mobility: Needs Assistance Bed Mobility: Supine to Sit Supine to sit: Min assist, HOB elevated General bed mobility comments: Min assist to support trunk and help progresss legs over EOB with HOB elevated. Pt using bed rail with cues and manual assist to find bedrail from therapist.     Transfers  Overall transfer level: Needs assistance Equipment used: Rolling walker (2 wheeled) Transfers: Sit to/from Stand Sit to Stand: Min assist General transfer comment: Min assist to support trunk during transition to stand over weak legs.     Ambulation / Gait / Stairs / Wheelchair Mobility  Ambulation/Gait Ambulation/Gait assistance: Museum/gallery curator (Feet): 50 Feet Assistive device: Rolling walker (2 wheeled) Gait Pattern/deviations: Step-through pattern, Shuffle, Trunk flexed General Gait Details: Pt with slow, shuffling gait speed, flexed over RW.     Posture / Balance Balance Overall balance assessment: Needs assistance Sitting-balance support: Feet supported, Bilateral upper extremity supported Sitting balance-Leahy Scale: Fair Standing balance support: Bilateral upper extremity supported Standing balance-Leahy Scale: Poor    Special needs/care consideration BiPAP/CPAP No CPM No Continuous Drip IV No Dialysis No  Life Vest No Oxygen No Special Bed No Trach Size No Wound Vac (area) No  Skin Has bruising on left face and on both lower extremities  Bowel mgmt: Last BM 12/15/15 with incontinence Bladder mgmt: Voiding with incontinence Diabetic mgmt Yes, but has been off medications for the past 3 years.    Previous Home Environment Living Arrangements: Spouse/significant other Available Help at Discharge: Family, Available 24 hours/day (wife is 20 years yonger than pt) Type of Home: House Home Layout: One level Home Access: Stairs to enter Entrance Stairs-Rails:  Left Entrance Stairs-Number of Steps: 3 Bathroom Shower/Tub: Gaffer, Door [employer] Toilet: Handicapped height Bathroom Accessibility:  Yes Home Care Services: No Additional Comments: Pt was independent PTA and still drove  Discharge Living Setting Plans for Discharge Living Setting: Patient's home, House, Lives with (comment) (Lives with his wife and their dog, Dimple Nanas, a sheltie.) Type of Home at Discharge: House Discharge Home Layout: One level Discharge Home Access: Stairs to enter Entrance Stairs-Number of Steps: 3 steps entry Does the patient have any problems obtaining your medications?: No  Social/Family/Support Systems Patient Roles: Spouse, Parent (Has a wife and a son.) Contact Information: Tobie Poet - wife Anticipated Caregiver: Wife Anticipated Caregiver's Contact Information: [name] - wife (h) [phone] (c) [phone] Ability/Limitations of Caregiver: Wife can assist Caregiver Availability: 24/7 Discharge Plan Discussed with Primary Caregiver: Yes Is Caregiver In Agreement with Plan?: Yes Does Caregiver/Family have Issues with Lodging/Transportation while Pt is in Rehab?: No  Goals/Additional Needs Patient/Family Goal for Rehab: PT/OT/SLP mod I goals Expected length of stay: 7 days Cultural Considerations: Christian Dietary Needs: Dys 3, nectar thick liquids Equipment Needs: TBD Additional Information: Wife needs to go to the beach to see about their home for a few days as soon as she can. Pt/Family Agrees to Admission and willing to participate: Yes Program Orientation Provided & Reviewed with Pt/Caregiver Including Roles & Responsibilities: Yes  Decrease burden of Care through IP rehab admission: N/A  Possible need for SNF placement upon discharge: Not planned  Patient Condition: This patient's medical and functional status has changed since the consult dated: Original consult done on 12/09/15 in which the Rehabilitation Physician determined and  documented that the patient's condition is appropriate for intensive rehabilitative care in an inpatient rehabilitation facility. Patient was admitted to inpatient rehab, but quickly had changes which resulted in return to the acute hospital. See "History of Present Illness" (above) for medical update. Functional changes are: Currently min assist 50 feet RW and looked much better today. Patient's medical and functional status update has been discussed with the Rehabilitation physician and patient remains appropriate for inpatient rehabilitation. Will admit to inpatient rehab today.  Preadmission Screen Completed By: Retta Diones, 12/15/2015 2:24 PM ______________________________________________________________________  Discussed status with Dr. Naaman Plummer on 12/15/15 at 1424 and received telephone approval for admission today.  Admission Coordinator: Retta Diones, time1424/Date05/02/17          Cosigned by: Meredith Staggers, MD at 12/15/2015 2:33 PM  Revision History     Date/Time User Provider Type Action   12/15/2015 2:33 PM Meredith Staggers, MD Physician Cosign   12/15/2015 2:24 PM Retta Diones, RN Rehab Admission Coordinator Sign

## 2015-12-15 NOTE — Progress Notes (Signed)
Patient arrived to Rehab unit around 1630 with wife from previous unit. Admission was initiated and reviewed with patient without difficulty. Explained regular rehab routines and safety precautions. Pt verbalized understanding to safety plan agreement. Oriented patient to the call bell and placed within reach, continue plan of care. [name], [name]

## 2015-12-15 NOTE — Care Management Important Message (Signed)
Important Message  Patient Details  Name: [name] MRN: [id number] Date of Birth: 1925/03/11   Medicare Important Message Given:  Yes    Nathen May 12/15/2015, 12:23 PM

## 2015-12-15 NOTE — Care Management Note (Signed)
Case Management Note  Patient Details  Name: DONTAY BURDELL MRN: [id number] Date of Birth: [birth year]  Subjective/Objective:   Patient is for dc to CIR today.                  Action/Plan:   Expected Discharge Date:                  Expected Discharge Plan:  IP Rehab Facility  In-House Referral:  Clinical Social Work  Discharge planning Services  CM Consult  Post Acute Care Choice:    Choice offered to:     DME Arranged:    DME Agency:     HH Arranged:    [place] Agency:     Status of Service:  Completed, signed off  Medicare Important Message Given:  Yes Date Medicare IM Given:    Medicare IM give by:    Date Additional Medicare IM Given:    Additional Medicare Important Message give by:     If discussed at [place] of Stay Meetings, dates discussed:    Additional Comments:  Zenon Mayo, RN 12/15/2015, 12:41 PM

## 2015-12-15 NOTE — H&P (Signed)
Physical Medicine and Rehabilitation Admission H&P    Chief complaint: Weakness  HPI: [name] is a 82 y.o. right handed male with history of atrial fibrillation maintained on chronic Coumadin, coronary artery disease/Pacemaker status post CABG, systolic and diastolic congestive heart failure, type 2 diabetes mellitus, bladder cancer. Lives with wife in [place]. Reports to be independent prior to admission with a general decline over the past few months.Recent fall and hit the corner of his eye on the sink. Presented 12/05/2015 with bright red blood from the rectum as well as a recent fall from the toilet approximately 1 week ago. Patient reports some recent constipation 2 weeks ago but denied any nausea vomiting or abdominal pain. Patient with ongoing active bleeding in the ED. Hemoglobin 6.5. INR 4.86. EKG showed new ST changes and T-wave inversions in the anterior leads. Troponin 0.20. Patient did receive vitamin K fresh frozen plasma 2 units and 1 unit of packed red blood cells. Gastroenterology services consulted Dr. Carlean Purl with findings of a single solitary ulcer in the rectum and in the distal rectum. Underwent epi injection and then cauterization. Close monitoring of hemoglobin status post PRBCs 8 with latest hemoglobin 8.3. Patient's chronic Coumadin currently remains on hold and presently on aspirin.Vascular study bilateral upper extremities 12/10/2015 showed superficial thrombus noted in the right basilic vein and in the left cephalic vein. He was placed on subcutaneous heparin for DVT prophylaxis and monitored. Current plan is to remain off Coumadin for 2 weeks per gastroenterology services. Follow-up cardiology services with echocardiogram showing ejection fraction of 35%. Akinesis of the anterior inferior lateral and apical myocardium. Placed on intravenous Lasix for acute on chronic systolic and diastolic heart failure and transition to by mouth. Close monitoring of  creatinine 1.89-2.43. Tolerating a regular diet. Urine culture Escherichia coli presently maintained on Rocephin until 12/16/2015. Physical therapy evaluation completed 12/08/2015 for debilitation with recommendations of physical medicine rehabilitation consult. Patient was admitted for a comprehensive rehabilitation program 12/09/2015. Patient was slow progressive gains. On the evening of 12/11/2015 noted waxing and waning altered mental status and transient episode of being unresponsive. Cranial CT scan negative. Patient was discharged to acute care services for ongoing workup. Neurology was consulted. EEG negative for seizure. Neurology was still concern of possible seizure clinically and was placed on Depakote. 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 CTA of head showed no emergent large vessel occlusion or high-grade stenosis. Subcutaneous heparin the patient had been initially placed on for superficial thrombus right basilic vein and left cephalic vein had been discontinued due to risk of bleeding. Mental status continued to improve. Palliative care was consulted for goals of care. Diet was downgraded to a mechanical soft nectar thick liquid. Therapies resumed 12/14/2015. He was able to stand and walk short distances. Patient was readmitted for comprehensive rehabilitation program.  ROS ROS Constitutional: Negative for fever and chills.  HENT: Negative for hearing loss.  Eyes: Negative for blurred vision and double vision.  Respiratory: Positive for shortness of breath. Negative for cough.  Cardiovascular: Positive for palpitations and leg swelling. Negative for chest pain.  Gastrointestinal: Positive for constipation. Negative for nausea and vomiting.  Genitourinary: Negative for dysuria and hematuria.  Musculoskeletal: Positive for myalgias, joint pain and falls. Negative for back pain.  Skin: Negative  for rash.  Neurological: Positive for weakness. Negative for seizures and headaches.  All other systems reviewed and are negative  Past Medical History  Diagnosis Date  . Atrial fibrillation (HCC)     Chronic  . Coronary artery disease 2004    Status post CABG with LIMA-LAD, SVG-CFX. Hx of anterior apical infarct.  . Renal insufficiency     Chronic  . Hypertension   . Hyperlipidemia   . Gout   . CHF (congestive heart failure) (HCC)     with chronic ischemic cardiomypathy. EF of 10-15%.  CHF due to systolic dysfunction. Clinically doing well.  [name] GERD (gastroesophageal reflux disease)   . Pacemaker     Medtronic biventricular pacemaker, serial number [id number]  . Type II diabetes mellitus ([place])     "been off my RX for ~ 2 yr" (03/20/2013)  . H/O hiatal hernia   . Arthritis     "right thumb" (03/20/2013)  . Depression   . Anxiety   . Bladder cancer ([place])     "low grade; grade 1; non-invasive" (03/20/2013)  . CKD (chronic kidney disease), stage III    Past Surgical History  Procedure Laterality Date  . Transurethral resection of prostate  1990's; 2004    "twice; injected chemo 1st time; didn't do that 2nd time" (03/20/2013)  . Nasal septum surgery    . US echocardiography  04-17-2009    Est EF 10-15%  . Cardiovascular stress test  04-17-2009    EF 29%  . Coronary artery bypass graft  02/19/2003    LIMA-LAD, SVG-CFX  . Carotid endarterectomy Right 1990's    "left side is still  100% blocked" (03/20/2013)  . Icd lead removal Left 03/01/2013    Procedure: ICD LEAD REMOVAL;  Surgeon: Evans Lance, MD;  Location: [facility];  Service: Cardiovascular;  Laterality: Left;  . Pacemaker lead removal Left 03/01/2013    Procedure: PACEMAKER LEAD REMOVAL;  Surgeon: Evans Lance, MD;  Location: [facility];  Service: Cardiovascular;  Laterality: Left;  . Pacemaker insertion N/A 03/01/2013    Procedure: INSERTION PACEMAKER LEAD;  Surgeon: Evans Lance, MD;  Location: [facility];  Service: Cardiovascular;   Laterality: N/A;  . Hemorrhoid surgery      "lanced several years ago" (03/20/2013)  . Tonsillectomy and adenoidectomy  1933  . Cataract extraction w/ intraocular lens  implant, bilateral Bilateral ~ 2000  . Bi-ventricular pacemaker insertion N/A 03/20/2013    Procedure: BI-VENTRICULAR PACEMAKER INSERTION (CRT-P);  Surgeon: Evans Lance, MD; Medtronic biventricular pacemaker, serial number [id number]; Laterality: Right  . Flexible sigmoidoscopy N/A 12/05/2015    Procedure: FLEXIBLE SIGMOIDOSCOPY;  Surgeon: [name], MD;  Location: [facility];  Service: Endoscopy;  Laterality: N/A;   Family History  Problem Relation Age of Onset  . Heart attack Mother    Social History:  reports that he quit smoking about 55 years ago. His smoking use included Cigarettes. He has a 17 pack-year smoking history. He has never used smokeless tobacco. He reports that he drinks alcohol. He reports that he does not use illicit drugs. Allergies:  Allergies  Allergen Reactions  . Penicillins Hives and Itching    Has patient had a PCN reaction causing immediate rash, facial/tongue/throat swelling, SOB or lightheadedness with hypotension: YES Has patient had a PCN reaction causing severe rash involving mucus membranes or skin necrosis: NO Has patient had a PCN reaction that required hospitalizationNO Has patient had a PCN reaction occurring within the last 10 years: NO If all of the above answers are "NO", then may proceed with Cephalosporin use.   Medications Prior to  Admission  Medication Sig Dispense Refill  . allopurinol (ZYLOPRIM) 300 MG tablet Take 300 mg by mouth daily.      [name] ALPRAZolam (XANAX) 0.5 MG tablet Take 1 mg by mouth at bedtime.     . calcium-vitamin D (OSCAL WITH D) 500-200 MG-UNIT per tablet Take 1 tablet by mouth daily.     . digoxin (LANOXIN) 0.125 MG tablet Take 0.0625 mg by mouth daily.     . furosemide (LASIX) 40 MG tablet Take 80 mg by mouth daily.    [name] HYDROcodone-acetaminophen  (NORCO/VICODIN) 5-325 MG per tablet Take 0.5 tablets by mouth at bedtime.     [name] ipratropium (ATROVENT) 0.03 % nasal spray Place 1-2 sprays into both nostrils 3 (three) times daily as needed for rhinitis (congestion).   4  . lovastatin (MEVACOR) 20 MG tablet Take 20 mg by mouth at bedtime. Reported on 11/24/2015    . megestrol (MEGACE) 40 MG/ML suspension Take 400 mg by mouth 2 (two) times daily. 10 mls - every morning and at 3pm    . Melatonin 10 MG TABS Take 10 mg by mouth at bedtime.    . multivitamin-lutein (OCUVITE-LUTEIN) CAPS Take 1 capsule by mouth daily.    . Naphazoline HCl (CLEAR EYES OP) Place 1 drop into both eyes daily as needed (irritation/ eye drop).    . Triamcinolone Acetonide (NASACORT AQ NA) Place 1 spray into both nostrils daily.    . vitamin B-12 (CYANOCOBALAMIN) 1000 MCG tablet Take 1,000 mcg by mouth daily.      Home: Home Living Family/patient expects to be discharged to:: Private residence Living Arrangements: Spouse/significant other Available Help at Discharge: Family, Available 24 hours/day (wife is 20 years yonger than pt) Type of Home: House Home Access: Stairs to enter Technical brewer of Steps: 3 Entrance Stairs-Rails: Left Home Layout: One level Bathroom Shower/Tub: Gaffer, Door [employer] Toilet: Handicapped height Bathroom Accessibility: Yes Home Equipment: Radio producer - single point, Civil engineer, contracting - built in Additional Comments: Pt was independent PTA and still drove   Functional History: Prior Function Level of Independence: Independent Comments: still drives, had dog, "Mickey"  Functional Status:  Mobility: Bed Mobility Overal bed mobility: Needs Assistance Bed Mobility: Supine to Sit Supine to sit: Min assist, HOB elevated General bed mobility comments: Min assist to support trunk and help progresss legs over EOB with HOB elevated.  Pt using bed rail with cues and manual assist to find bedrail from therapist.  Transfers Overall transfer  level: Needs assistance Equipment used: Rolling walker (2 wheeled) Transfers: Sit to/from Stand Sit to Stand: Min assist General transfer comment: Min assist to support trunk during transition to stand over weak legs.  Ambulation/Gait Ambulation/Gait assistance: Min assist Ambulation Distance (Feet): 50 Feet Assistive device: Rolling walker (2 wheeled) Gait Pattern/deviations: Step-through pattern, Shuffle, Trunk flexed General Gait Details: Pt with slow, shuffling gait speed, flexed over RW.      ADL:    Cognition: Cognition Overall Cognitive Status: Impaired/Different from baseline Orientation Level: Oriented to person, Oriented to place, Oriented to time, Disoriented to situation Cognition Arousal/Alertness: Awake/alert Behavior During Therapy: WFL for tasks assessed/performed Overall Cognitive Status: Impaired/Different from baseline Area of Impairment: Memory Memory: Decreased short-term memory (pt having difficult time with remembering events of past few)  Physical Exam: Blood pressure 98/65, pulse 89, temperature 98 F (36.7 C), temperature source Oral, resp. rate 18, height 5' 10"  (1.778 m), weight 50 kg (110 lb 3.7 oz), SpO2 100 %. Physical Exam  Vitals reviewed. HENT:  Bruising to left orbital area and side of face  Eyes: EOM are normal.  Neck: Normal range of motion. Neck supple. No thyromegaly present.  Cardiovascular:  Cardiac rate controlled  Respiratory: Effort normal and breath sounds normal. No respiratory distress.  GI: Soft. Bowel sounds are normal. He exhibits no distension.  Neurological: He is alert.  Makes good eye contact with examiner. Follows simple commands. He is able to state his name and age in place. He recalled being on the rehab floor and actually recalled I conversation we had the afternoon we admitted him. UE grossly 4/5 prox to distal. LE: 3+/5 HF, KE 4-, ADF/PF 4/5. Sensory exam grossly intact.   Skin: Skin is warm and dry.  Numerous  bruises on his arms and legs. A few scattered abrasions and small wound near right fibular head.   Psychiatric: He has a normal mood and affect. His behavior is normal.    Results for orders placed or performed during the hospital encounter of 12/12/15 (from the past 48 hour(s))  Glucose, capillary     Status: Abnormal   Collection Time: 12/13/15 11:03 AM  Result Value Ref Range   Glucose-Capillary 108 (H) 65 - 99 mg/dL  Glucose, capillary     Status: Abnormal   Collection Time: 12/13/15  3:48 PM  Result Value Ref Range   Glucose-Capillary 182 (H) 65 - 99 mg/dL   Comment 1 Notify RN   Glucose, capillary     Status: Abnormal   Collection Time: 12/13/15  7:12 PM  Result Value Ref Range   Glucose-Capillary 142 (H) 65 - 99 mg/dL  Glucose, capillary     Status: Abnormal   Collection Time: 12/13/15 11:54 PM  Result Value Ref Range   Glucose-Capillary 163 (H) 65 - 99 mg/dL  Glucose, capillary     Status: None   Collection Time: 12/14/15  4:06 AM  Result Value Ref Range   Glucose-Capillary 78 65 - 99 mg/dL  Procalcitonin     Status: None   Collection Time: 12/14/15  5:26 AM  Result Value Ref Range   Procalcitonin 0.11 ng/mL    Comment:        Interpretation: PCT (Procalcitonin) <= 0.5 ng/mL: Systemic infection (sepsis) is not likely. Local bacterial infection is possible. (NOTE)         ICU PCT Algorithm               Non ICU PCT Algorithm    ----------------------------     ------------------------------         PCT < 0.25 ng/mL                 PCT < 0.1 ng/mL     Stopping of antibiotics            Stopping of antibiotics       strongly encouraged.               strongly encouraged.    ----------------------------     ------------------------------       PCT level decrease by               PCT < 0.25 ng/mL       >= 80% from peak PCT       OR PCT 0.25 - 0.5 ng/mL          Stopping of antibiotics  encouraged.     Stopping of  antibiotics           encouraged.    ----------------------------     ------------------------------       PCT level decrease by              PCT >= 0.25 ng/mL       < 80% from peak PCT        AND PCT >= 0.5 ng/mL            Continuin g antibiotics                                              encouraged.       Continuing antibiotics            encouraged.    ----------------------------     ------------------------------     PCT level increase compared          PCT > 0.5 ng/mL         with peak PCT AND          PCT >= 0.5 ng/mL             Escalation of antibiotics                                          strongly encouraged.      Escalation of antibiotics        strongly encouraged.   Glucose, capillary     Status: Abnormal   Collection Time: 12/14/15  7:21 AM  Result Value Ref Range   Glucose-Capillary 104 (H) 65 - 99 mg/dL   Comment 1 Notify RN    Comment 2 Document in Chart   Glucose, capillary     Status: Abnormal   Collection Time: 12/14/15 12:17 PM  Result Value Ref Range   Glucose-Capillary 164 (H) 65 - 99 mg/dL   Comment 1 Notify RN    Comment 2 Document in Chart   Glucose, capillary     Status: Abnormal   Collection Time: 12/14/15  2:55 PM  Result Value Ref Range   Glucose-Capillary 171 (H) 65 - 99 mg/dL   Comment 1 Notify RN    Comment 2 Document in Chart   Glucose, capillary     Status: Abnormal   Collection Time: 12/14/15  7:14 PM  Result Value Ref Range   Glucose-Capillary 135 (H) 65 - 99 mg/dL  Digoxin level     Status: Abnormal   Collection Time: 12/14/15  9:18 PM  Result Value Ref Range   Digoxin Level 0.5 (L) 0.8 - 2.0 ng/mL  Magnesium     Status: None   Collection Time: 12/14/15  9:18 PM  Result Value Ref Range   Magnesium 2.0 1.7 - 2.4 mg/dL  Valproic acid level     Status: None   Collection Time: 12/14/15  9:18 PM  Result Value Ref Range   Valproic Acid Lvl 82 50.0 - 100.0 ug/mL  Comprehensive metabolic panel     Status: Abnormal   Collection  Time: 12/14/15  9:18 PM  Result Value Ref Range   Sodium 133 (L) 135 - 145 mmol/L   Potassium 4.8 3.5 - 5.1 mmol/L   Chloride 102 101 - 111  mmol/L   CO2 21 (L) 22 - 32 mmol/L   Glucose, Bld 71 65 - 99 mg/dL   BUN 37 (H) 6 - 20 mg/dL   Creatinine, Ser 1.41 (H) 0.61 - 1.24 mg/dL   Calcium 7.6 (L) 8.9 - 10.3 mg/dL   Total Protein 4.4 (L) 6.5 - 8.1 g/dL   Albumin 2.4 (L) 3.5 - 5.0 g/dL   AST 36 15 - 41 U/L   ALT 40 17 - 63 U/L   Alkaline Phosphatase 90 38 - 126 U/L   Total Bilirubin 0.5 0.3 - 1.2 mg/dL   GFR calc non Af Amer 43 (L) >60 mL/min   GFR calc Af Amer 50 (L) >60 mL/min    Comment: (NOTE) The eGFR has been calculated using the CKD EPI equation. This calculation has not been validated in all clinical situations. eGFR's persistently <60 mL/min signify possible Chronic Kidney Disease.    Anion gap 10 5 - 15  CBC     Status: Abnormal   Collection Time: 12/14/15  9:18 PM  Result Value Ref Range   WBC 9.4 4.0 - 10.5 K/uL   RBC 3.03 (L) 4.22 - 5.81 MIL/uL   Hemoglobin 9.3 (L) 13.0 - 17.0 g/dL   HCT 28.9 (L) 39.0 - 52.0 %   MCV 95.4 78.0 - 100.0 fL   MCH 30.7 26.0 - 34.0 pg   MCHC 32.2 30.0 - 36.0 g/dL   RDW 19.3 (H) 11.5 - 15.5 %   Platelets 206 150 - 400 K/uL  Glucose, capillary     Status: Abnormal   Collection Time: 12/14/15 11:37 PM  Result Value Ref Range   Glucose-Capillary 114 (H) 65 - 99 mg/dL  Urinalysis, Routine w reflex microscopic (not at [facility])     Status: None   Collection Time: 12/15/15  2:55 AM  Result Value Ref Range   Color, Urine YELLOW YELLOW   APPearance CLEAR CLEAR   Specific Gravity, Urine 1.017 1.005 - 1.030   pH 6.5 5.0 - 8.0   Glucose, UA NEGATIVE NEGATIVE mg/dL   Hgb urine dipstick NEGATIVE NEGATIVE   Bilirubin Urine NEGATIVE NEGATIVE   Ketones, ur NEGATIVE NEGATIVE mg/dL   Protein, ur NEGATIVE NEGATIVE mg/dL   Nitrite NEGATIVE NEGATIVE   Leukocytes, UA NEGATIVE NEGATIVE    Comment: MICROSCOPIC NOT DONE ON URINES WITH NEGATIVE PROTEIN,  BLOOD, LEUKOCYTES, NITRITE, OR GLUCOSE <1000 mg/dL.  Glucose, capillary     Status: None   Collection Time: 12/15/15  3:34 AM  Result Value Ref Range   Glucose-Capillary 94 65 - 99 mg/dL   No results found.     Medical Problem List and Plan: 1.  Debilitation and resolving encephalopathy secondary to GI bleed status post epi injection and cauterization/multi-medical 2.  DVT Prophylaxis/Anticoagulation: Superficial thrombus right basilic vein and left cephalic vein. No anticoagulation at this time due to GI bleed. Consider follow-up Doppler study. 3. Pain Management: Low-dose oxycodone 5 mg every 4 hours as needed. Monitor mental status 4. Seizure prophylaxis. Depakote 750 mg every 12 hours. 5. Neuropsych: This patient is capable of making decisions on his own behalf. 6. Skin/Wound Care: Routine skin checks 7. Fluids/Electrolytes/Nutrition: Routine I&O's with follow-up chemistries 8. CAD status post CABG. No chest pain or shortness of breath. 9. History of atrial fibrillation. Coumadin remains on hold due to GI bleed consider resuming in TWO WEEKS IF NO FURTHER BLEEDING versus aspirin. Continue Lanoxin 0.0625 mg daily 10. Systolic diastolic congestive heart failure. Monitor for any signs of fluid  overload. 11. Escherichia coli urinary tract infection. Complete Rocephin Through 12/16/2015 and stop 12. Gout. Zyloprim 300 mg daily 13. Hyperlipidemia. Pravachol  Post Admission Physician Evaluation: 1. Functional deficits secondary  to debility/encephalopahty. 2. Patient is admitted to receive collaborative, interdisciplinary care between the physiatrist, rehab nursing staff, and therapy team. 3. Patient's level of medical complexity and substantial therapy needs in context of that medical necessity cannot be provided at a lesser intensity of care such as a SNF. 4. Patient has experienced substantial functional loss from his/her baseline which was documented above under the "Functional  History" and "Functional Status" headings.  Judging by the patient's diagnosis, physical exam, and functional history, the patient has potential for functional progress which will result in measurable gains while on inpatient rehab.  These gains will be of substantial and practical use upon discharge  in facilitating mobility and self-care at the household level. 5. Physiatrist will provide 24 hour management of medical needs as well as oversight of the therapy plan/treatment and provide guidance as appropriate regarding the interaction of the two. 6. 24 hour rehab nursing will assist with bladder management, bowel management, safety, skin/wound care, disease management, medication administration, pain management and patient education  and help integrate therapy concepts, techniques,education, etc. 7. PT will assess and treat for/with: Lower extremity strength, range of motion, stamina, balance, functional mobility, safety, adaptive techniques and equipment, NMR, home safety, pain mgt, ego support.   Goals are: supervision to mod I. 8. OT will assess and treat for/with: ADL's, functional mobility, safety, upper extremity strength, adaptive techniques and equipment, NMR, education, ego support.   Goals are: mod I to supervision. Therapy may proceed with showering this patient. 9. SLP will assess and treat for/with: cognition, communication.  Goals are: mod I. 10. Case Management and Social Worker will assess and treat for psychological issues and discharge planning. 11. Team conference will be held weekly to assess progress toward goals and to determine barriers to discharge. 12. Patient will receive at least 3 hours of therapy per day at least 5 days per week. 13. ELOS: 7-10 days       14. Prognosis:  excellent     Meredith Staggers, MD, [facility] 12/15/2015   12/15/2015

## 2015-12-16 ENCOUNTER — Inpatient Hospital Stay (HOSPITAL_COMMUNITY): Payer: Medicare Other | Admitting: Physical Therapy

## 2015-12-16 ENCOUNTER — Inpatient Hospital Stay (HOSPITAL_COMMUNITY): Payer: Medicare Other | Admitting: Speech Pathology

## 2015-12-16 ENCOUNTER — Inpatient Hospital Stay (HOSPITAL_COMMUNITY): Payer: Medicare Other | Admitting: Occupational Therapy

## 2015-12-16 DIAGNOSIS — E875 Hyperkalemia: Secondary | ICD-10-CM | POA: Insufficient documentation

## 2015-12-16 DIAGNOSIS — N390 Urinary tract infection, site not specified: Secondary | ICD-10-CM

## 2015-12-16 DIAGNOSIS — B9620 Unspecified Escherichia coli [E. coli] as the cause of diseases classified elsewhere: Secondary | ICD-10-CM

## 2015-12-16 DIAGNOSIS — Z8719 Personal history of other diseases of the digestive system: Secondary | ICD-10-CM | POA: Insufficient documentation

## 2015-12-16 DIAGNOSIS — R7303 Prediabetes: Secondary | ICD-10-CM | POA: Insufficient documentation

## 2015-12-16 DIAGNOSIS — I480 Paroxysmal atrial fibrillation: Secondary | ICD-10-CM | POA: Insufficient documentation

## 2015-12-16 DIAGNOSIS — D72829 Elevated white blood cell count, unspecified: Secondary | ICD-10-CM | POA: Insufficient documentation

## 2015-12-16 DIAGNOSIS — D62 Acute posthemorrhagic anemia: Secondary | ICD-10-CM | POA: Insufficient documentation

## 2015-12-16 DIAGNOSIS — R1310 Dysphagia, unspecified: Secondary | ICD-10-CM | POA: Insufficient documentation

## 2015-12-16 LAB — GLUCOSE, CAPILLARY
GLUCOSE-CAPILLARY: 103 mg/dL — AB (ref 65–99)
GLUCOSE-CAPILLARY: 131 mg/dL — AB (ref 65–99)
Glucose-Capillary: 152 mg/dL — ABNORMAL HIGH (ref 65–99)
Glucose-Capillary: 167 mg/dL — ABNORMAL HIGH (ref 65–99)

## 2015-12-16 LAB — COMPREHENSIVE METABOLIC PANEL
ALT: 35 U/L (ref 17–63)
AST: 36 U/L (ref 15–41)
Albumin: 2.4 g/dL — ABNORMAL LOW (ref 3.5–5.0)
Alkaline Phosphatase: 96 U/L (ref 38–126)
Anion gap: 10 (ref 5–15)
BILIRUBIN TOTAL: 0.8 mg/dL (ref 0.3–1.2)
BUN: 37 mg/dL — ABNORMAL HIGH (ref 6–20)
CALCIUM: 7.8 mg/dL — AB (ref 8.9–10.3)
CO2: 20 mmol/L — ABNORMAL LOW (ref 22–32)
CREATININE: 1.55 mg/dL — AB (ref 0.61–1.24)
Chloride: 106 mmol/L (ref 101–111)
GFR, EST AFRICAN AMERICAN: 44 mL/min — AB (ref >60)
GFR, EST NON AFRICAN AMERICAN: 38 mL/min — AB (ref >60)
Glucose, Bld: 106 mg/dL — ABNORMAL HIGH (ref 65–99)
Potassium: 5.9 mmol/L — ABNORMAL HIGH (ref 3.5–5.1)
Sodium: 136 mmol/L (ref 135–145)
TOTAL PROTEIN: 4.8 g/dL — AB (ref 6.5–8.1)

## 2015-12-16 LAB — CBC WITH DIFFERENTIAL/PLATELET
Basophils Absolute: 0 10*3/uL (ref 0.0–0.1)
Basophils Relative: 0 %
EOS PCT: 2 %
Eosinophils Absolute: 0.2 10*3/uL (ref 0.0–0.7)
HEMATOCRIT: 31.9 % — AB (ref 39.0–52.0)
Hemoglobin: 10.2 g/dL — ABNORMAL LOW (ref 13.0–17.0)
LYMPHS ABS: 1.1 10*3/uL (ref 0.7–4.0)
LYMPHS PCT: 9 %
MCH: 30.7 pg (ref 26.0–34.0)
MCHC: 32 g/dL (ref 30.0–36.0)
MCV: 96.1 fL (ref 78.0–100.0)
MONO ABS: 1.1 10*3/uL — AB (ref 0.1–1.0)
Monocytes Relative: 9 %
NEUTROS ABS: 9.5 10*3/uL — AB (ref 1.7–7.7)
Neutrophils Relative %: 80 %
PLATELETS: 210 10*3/uL (ref 150–400)
RBC: 3.32 MIL/uL — AB (ref 4.22–5.81)
RDW: 19.5 % — AB (ref 11.5–15.5)
WBC: 11.9 10*3/uL — ABNORMAL HIGH (ref 4.0–10.5)

## 2015-12-16 LAB — URINE CULTURE: Culture: NO GROWTH

## 2015-12-16 LAB — CALCIUM, IONIZED: CALCIUM, IONIZED, SERUM: 4.6 mg/dL (ref 4.5–5.6)

## 2015-12-16 MED ORDER — ENSURE ENLIVE PO LIQD
237.0000 mL | Freq: Two times a day (BID) | ORAL | Status: DC
Start: 2015-12-16 — End: 2015-12-24
  Administered 2015-12-16 – 2015-12-24 (×16): 237 mL via ORAL

## 2015-12-16 MED ORDER — SODIUM POLYSTYRENE SULFONATE 15 GM/60ML PO SUSP
30.0000 g | Freq: Once | ORAL | Status: AC
Start: 2015-12-16 — End: 2015-12-16
  Administered 2015-12-16: 30 g via ORAL
  Filled 2015-12-16: qty 120

## 2015-12-16 MED ORDER — DEXTROSE 5 % IV SOLN
1.0000 g | INTRAVENOUS | Status: AC
  Administered 2015-12-16: 1 g via INTRAVENOUS
  Filled 2015-12-16: qty 10

## 2015-12-16 NOTE — IPOC Note (Signed)
Overall Plan of Care (IPOC) Patient Details Name: [name] MRN: [id number] DOB: 08-04-1925  Admitting Diagnosis: debility  Hospital Problems: Principal Problem:   Debilitated Active Problems:   CKD (chronic kidney disease), stage III   Acute encephalopathy   Chronic combined systolic and diastolic congestive heart failure (HCC)   Hyperkalemia   PAF (paroxysmal atrial fibrillation) (HCC)   History of GI bleed   E. coli UTI   Prediabetes   Dysphagia   Acute blood loss anemia   Leukocytosis     Functional Problem List: Nursing Behavior, Bladder, Bowel, Edema, Endurance, Medication Management, Nutrition, Pain, Safety, Skin Integrity  PT Balance, Edema, Endurance, Motor  OT Balance, Endurance, Motor, Safety  SLP Cognition  TR         Basic ADL's: OT Grooming, Bathing, Dressing, Toileting     Advanced  ADL's: OT  (n/a)     Transfers: PT Bed Mobility, Bed to Chair, Car, Manufacturing systems engineer, Metallurgist: PT Ambulation, Stairs, Emergency planning/management officer     Additional Impairments: OT None  SLP Swallowing, Social Cognition   Memory, Problem Solving  TR      Anticipated Outcomes Item Anticipated Outcome  Self Feeding n/a  Swallowing  Mod I with least restrictive diet   Basic self-care  mod I overall  Toileting  mod I    Bathroom Transfers mod I - toilet , supervision - shower  Bowel/Bladder  Manage bowel and bladder with min assist  Transfers  mod I  Locomotion  mod I except on stairs and in community  Communication     Cognition  Mod I   Pain  Pain less than 3  Safety/Judgment  Remain safe while on the Rehab unit   Therapy Plan: PT Intensity: Minimum of 1-2 x/day ,45 to 90 minutes PT Frequency: 5 out of 7 days PT Duration Estimated Length of Stay: 7-10 OT Intensity: Minimum of 1-2 x/day, 45 to 90 minutes OT Frequency: 5 out of 7 days OT Duration/Estimated Length of Stay: 7-10 days SLP Intensity: Minumum of 1-2 x/day, 30 to 90  minutes SLP Frequency: 3 to 5 out of 7 days SLP Duration/Estimated Length of Stay: 12/24/15       Team Interventions: Nursing Interventions Patient/Family Education, Bladder Management, Bowel Management, Disease Management/Prevention, Pain Management, Medication Management, Skin Care/Wound Management, Cognitive Remediation/Compensation, Dysphagia/Aspiration Precaution Training, Discharge Planning, Psychosocial Support  PT interventions Ambulation/gait training, DME/adaptive equipment instruction, Neuromuscular re-education, Psychosocial support, Stair training, UE/LE Strength taining/ROM, Wheelchair propulsion/positioning, UE/LE Coordination activities, Therapeutic Activities, Discharge planning, Training and development officer, Functional mobility training, Patient/family education, Therapeutic Exercise  OT Interventions Training and development officer, Community reintegration, Discharge planning, Pain management, Neuromuscular re-education, Functional mobility training, DME/adaptive equipment instruction, Patient/family education, Therapeutic Activities, Therapeutic Exercise, Psychosocial support, Self Care/advanced ADL retraining, UE/LE Strength taining/ROM  SLP Interventions Cognitive remediation/compensation, Cueing hierarchy, Functional tasks, Dysphagia/aspiration precaution training, Patient/family education, Therapeutic Activities, Environmental controls, Internal/external aids  TR Interventions    SW/CM Interventions Discharge Planning, Psychosocial Support, Patient/Family Education    Team Discharge Planning: Destination: PT-Home ,OT- Home , SLP-Home Projected Follow-up: PT-Home health PT, OT-  Home health OT, SLP- (TBD) Projected Equipment Needs: PT-To be determined, OT- To be determined, SLP-None recommended by SLP Equipment Details: PT- , OT-  Patient/family involved in discharge planning: PT- Patient,  OT-Patient, SLP-Patient  MD ELOS: 7-10 days. Medical Rehab Prognosis:  Good Assessment:  82 y.o. right handed male with history of atrial fibrillation maintained on chronic Coumadin, coronary artery disease/Pacemaker status post  CABG, systolic and diastolic congestive heart failure, type 2 diabetes mellitus, bladder cancer. Reports to be independent prior to admission with a general decline over the past few months.Recent fall and hit the corner of his eye on the sink. Presented 12/05/2015 with bright red blood from the rectum as well as a recent fall from the toilet. Patient reported some constipation ago but denied any nausea vomiting or abdominal pain. Patient with ongoing active bleeding in the ED. Hemoglobin 6.5. INR 4.86. EKG showed new ST changes and T-wave inversions in the anterior leads. Troponin 0.20. Patient did receive vitamin K fresh frozen plasma 2 units and 1 unit of packed red blood cells. Gastroenterology services consulted Dr. Carlean Purl with findings of a single solitary ulcer in the rectum and in the distal rectum. Underwent epi injection and then cauterization. Close monitoring of hemoglobin status post PRBCs 8. Patient's chronic Coumadin remained on hold and was on aspirin.Vascular study bilateral upper extremities 12/10/2015 showed superficial thrombus noted in the right basilic vein and in the left cephalic vein. He was placed on subcutaneous heparin for DVT prophylaxis and monitored. Plan was to remain off Coumadin for 2 weeks per gastroenterology services. Follow-up cardiology services with echocardiogram showing ejection fraction of 35%. Akinesis of the anterior inferior lateral and apical myocardium. Placed on intravenous Lasix for acute on chronic systolic and diastolic heart failure and transition to by mouth. Close monitoring of creatinine. Tolerating a regular diet. Urine culture Escherichia coli presently maintained on Rocephin until 12/16/2015.  Patient was admitted for a comprehensive rehabilitation program 12/09/2015. Patient had slow progressive gains. On the evening  of 12/11/2015 noted waxing and waning altered mental status and transient episode of being unresponsive. Cranial CT scan negative. Patient was discharged to acute care services for ongoing workup. Neurology was consulted. EEG negative for seizure. Neurology was still concern of possible seizure clinically and was placed on Depakote. 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 CTA of head showed no emergent large vessel occlusion or high-grade stenosis. Subcutaneous heparin the patient had been initially placed on for superficial thrombus right basilic vein and left cephalic vein had been discontinued due to risk of bleeding. Mental status continued to improve. Palliative care was consulted for goals of care. Diet was downgraded to a mechanical soft nectar thick liquid. Therapies resumed 12/14/2015. He was able to stand and walk short distance with endurance and gait issues.  Will set goals for Mod I with PT and Mod I with OT.      See Team Conference Notes for weekly updates to the plan of care

## 2015-12-16 NOTE — Progress Notes (Signed)
Initial Nutrition Assessment  DOCUMENTATION CODES:   Severe malnutrition in context of chronic illness, Underweight  INTERVENTION:  Provide Ensure Enlive po BID (thickened to appropriate consistency), each supplement provides 350 kcal and 20 grams of protein.  Encourage adequate PO intake.  NUTRITION DIAGNOSIS:   Malnutrition related to chronic illness as evidenced by severe depletion of body fat, severe depletion of muscle mass.  GOAL:   Patient will meet greater than or equal to 90% of their needs  MONITOR:   PO intake, Supplement acceptance, Weight trends, Labs, I & O's, Diet advancement, Skin  REASON FOR ASSESSMENT:   Malnutrition Screening Tool    ASSESSMENT:   82 y.o. right handed male with history of atrial fibrillation maintained on chronic Coumadin, coronary artery disease status post CABG, systolic and diastolic congestive heart failure, type 2 diabetes mellitus, bladder cancer. Presented 12/05/2015 with bright red blood from the rectum as well as a recent fall . findings of a single solitary ulcer in the rectum and in the distal rectum. findings of a single solitary ulcer in the rectum and in the distal rectum.Underwent epi injection and then cauterization.  Meal completion has been 50%. Pt reports having a decreased appetite which has been ongoing over the past 6 months. Pt reports however trying to consume at least 3 meals a day. Pt does report he usually consumes Ensure at home at least 3 times daily. Usual body weight reported to be ~160 lbs. Per Epic weight records, pt with a 15.5% weight loss in 9 months. RD to order Ensure to aid in caloric and protein needs.  Nutrition-Focused physical exam completed. Findings are severe fat depletion, severe muscle depletion, and moderate edema.   Potassium elevated at 5.9.  Diet Order:  DIET DYS 3 Room service appropriate?: Yes; Fluid consistency:: Nectar Thick  Skin:  Reviewed, no issues  Last BM:  5/2  Height:   Ht  Readings from Last 1 Encounters:  12/15/15 5\' 7"  (1.702 m)    Weight:   Wt Readings from Last 1 Encounters:  12/16/15 109 lb 2 oz (49.5 kg)    Ideal Body Weight:  67.27 kg  BMI:  Body mass index is 17.09 kg/(m^2).  Estimated Nutritional Needs:   Kcal:  1650-1850  Protein:  75-85 grams  Fluid:  1.6 - 1.8 L/day  EDUCATION NEEDS:   No education needs identified at this time  Corrin Parker, MS, RD, LDN Pager # [phone] After hours/ weekend pager # [phone]

## 2015-12-16 NOTE — Progress Notes (Addendum)
I met with patient and his wife in follow-up today. He is being discharged to CIR today. We discussed his code status and we have decided that out of facility DNR is appropriate with partial code while inpatient- no chest compressions or intubation, elective cardioversion or cardioversion of reversible arrhythmia and meds only. Desires full scope medical treatment otherwise. His left shoulder pain is improved with scheduled tylenol and PRN oxycodone. Delirium improved with treatment of UTI. He is extremely frail. Will follow in CIR if re-consulted by PM&R team.  Lane Hacker, [place]

## 2015-12-16 NOTE — Progress Notes (Signed)
Patient information reviewed and entered into eRehab system by Kirandeep Fariss, RN, CRRN, PPS Coordinator.  Information including medical coding and functional independence measure will be reviewed and updated through discharge.     Per nursing patient was given "Data Collection Information Summary for Patients in Inpatient Rehabilitation Facilities with attached "Privacy Act Statement-Health Care Records" upon admission.  

## 2015-12-16 NOTE — Evaluation (Signed)
Speech Language Pathology Assessment and Plan  Patient Details  Name: [name] MRN: [id number] Date of Birth: [name] 09, 1925  SLP Diagnosis: Cognitive Impairments;Dysphagia  Rehab Potential: Excellent ELOS: 12/24/15    Today's Date: 12/16/2015 SLP Individual Time: 1000-1100 SLP Individual Time Calculation (min): 60 min   Problem List:  Patient Active Problem List   Diagnosis Date Noted  . Hyperkalemia   . PAF (paroxysmal atrial fibrillation) ([place])   . History of GI bleed   . E. coli UTI   . Prediabetes   . Dysphagia   . Acute blood loss anemia   . Leukocytosis   . Chronic combined systolic and diastolic congestive heart failure ([place]) 12/15/2015  . Anemia of chronic kidney failure 12/15/2015  . Debilitated 12/15/2015  . Acute encephalopathy   . CKD (chronic kidney disease), stage III   . Bleeding gastrointestinal   . GI bleed 12/05/2015  . Acute lower GI bleeding   . Biventricular cardiac pacemaker in situ 10/11/2011  . Atrial fibrillation [facility])    Past Medical History:  Past Medical History  Diagnosis Date  . Atrial fibrillation (HCC)     Chronic  . Coronary artery disease 2004    Status post CABG with LIMA-LAD, SVG-CFX. Hx of anterior apical infarct.  . Renal insufficiency     Chronic  . Hypertension   . Hyperlipidemia   . Gout   . CHF (congestive heart failure) (HCC)     with chronic ischemic cardiomypathy. EF of 10-15%.  CHF due to systolic dysfunction. Clinically doing well.  [name] GERD (gastroesophageal reflux disease)   . Pacemaker     Medtronic biventricular pacemaker, serial number [id number]  . Type II diabetes mellitus ([place])     "been off my RX for ~ 2 yr" (03/20/2013)  . H/O hiatal hernia   . Arthritis     "right thumb" (03/20/2013)  . Depression   . Anxiety   . Bladder cancer ([place])     "low grade; grade 1; non-invasive" (03/20/2013)  . CKD (chronic kidney disease), stage III    Past Surgical History:  Past Surgical History  Procedure Laterality Date   . Transurethral resection of prostate  1990's; 2004    "twice; injected chemo 1st time; didn't do that 2nd time" (03/20/2013)  . Nasal septum surgery    . US echocardiography  04-17-2009    Est EF 10-15%  . Cardiovascular stress test  04-17-2009    EF 29%  . Coronary artery bypass graft  02/19/2003    LIMA-LAD, SVG-CFX  . Carotid endarterectomy Right 1990's    "left side is still  100% blocked" (03/20/2013)  . Icd lead removal Left 03/01/2013    Procedure: ICD LEAD REMOVAL;  Surgeon: Evans Lance, MD;  Location: [place];  Service: Cardiovascular;  Laterality: Left;  . Pacemaker lead removal Left 03/01/2013    Procedure: PACEMAKER LEAD REMOVAL;  Surgeon: Evans Lance, MD;  Location: [place];  Service: Cardiovascular;  Laterality: Left;  . Pacemaker insertion N/A 03/01/2013    Procedure: INSERTION PACEMAKER LEAD;  Surgeon: Evans Lance, MD;  Location: [place];  Service: Cardiovascular;  Laterality: N/A;  . Hemorrhoid surgery      "lanced several years ago" (03/20/2013)  . Tonsillectomy and adenoidectomy  1933  . Cataract extraction w/ intraocular lens  implant, bilateral Bilateral ~ 2000  . Bi-ventricular pacemaker insertion N/A 03/20/2013    Procedure: BI-VENTRICULAR PACEMAKER INSERTION (CRT-P);  Surgeon: Evans Lance, MD; Medtronic biventricular pacemaker, serial  number [id number]; Laterality: Right  . Flexible sigmoidoscopy N/A 12/05/2015    Procedure: FLEXIBLE SIGMOIDOSCOPY;  Surgeon: [name], MD;  Location: [place];  Service: Endoscopy;  Laterality: N/A;    Assessment / Plan / Recommendation Clinical Impression 82 y.o. right handed male with history of atrial fibrillation maintained on chronic Coumadin, coronary artery disease/Pacemaker status post CABG, systolic and diastolic congestive heart failure, type 2 diabetes mellitus, bladder cancer. Reports to be independent prior to admission with a general decline over the past few months.Recent fall and hit the corner of his eye on the  sink. Presented 12/05/2015 with bright red blood from the rectum as well as a recent fall from the toilet approximately 1 week ago. Patient reports some recent constipation 2 weeks ago but denied any nausea vomiting or abdominal pain. Patient with ongoing active bleeding in the ED. Hemoglobin 6.5. INR 4.86. EKG showed new ST changes and T-wave inversions in the anterior leads. Troponin 0.20. Patient did receive vitamin K fresh frozen plasma 2 units and 1 unit of packed red blood cells. Gastroenterology services consulted Dr. Carlean Purl with findings of a single solitary ulcer in the rectum and in the distal rectum. Underwent epi injection and then cauterization. Close monitoring of hemoglobin status post PRBCs 8 with latest hemoglobin 8.3. Patient's chronic Coumadin currently remains on hold and presently on aspirin.Vascular study bilateral upper extremities 12/10/2015 showed superficial thrombus noted in the right basilic vein and in the left cephalic vein. He was placed on subcutaneous heparin for DVT prophylaxis and monitored. Current plan is to remain off Coumadin for 2 weeks per gastroenterology services. Follow-up cardiology services with echocardiogram showing ejection fraction of 35%. Akinesis of the anterior inferior lateral and apical myocardium. Placed on intravenous Lasix for acute on chronic systolic and diastolic heart failure and transition to by mouth. Close monitoring of creatinine 1.89-2.43. Tolerating a regular diet. Urine culture Escherichia coli presently maintained on Rocephin until 12/16/2015. Physical therapy evaluation completed 12/08/2015 for debilitation with recommendations of physical medicine rehabilitation consult. Patient was admitted for a comprehensive rehabilitation program 12/09/2015. Patient was slow progressive gains. On the evening of 12/11/2015 noted waxing and waning altered mental status and transient episode of being unresponsive. Cranial CT scan negative. Patient was  discharged to acute care services for ongoing workup. Neurology was consulted. EEG negative for seizure. Neurology was still concern of possible seizure clinically and was placed on Depakote. 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 CTA of head showed no emergent large vessel occlusion or high-grade stenosis. Subcutaneous heparin the patient had been initially placed on for superficial thrombus right basilic vein and left cephalic vein had been discontinued due to risk of bleeding. Mental status continued to improve. Palliative care was consulted for goals of care. Diet was downgraded to a mechanical soft nectar thick liquid. Therapies resumed 12/14/2015. He was able to stand and walk short distances. Patient was readmitted for comprehensive rehabilitation program on 12/16/15.  Patient demonstrates mild cognitive impairments in the areas of recall of new information and complex problem solving. Patient also demonstrates intermittent overt s/s of aspiration with thin liquids via cup characterized by multiple swallows and delayed throat clearing with an intermittent wet vocal quality, therefore, recommend patient initiate the water protocol with MBS tomorrow to assess dysphagia and possible readiness for upgrade to thin liquids. Patient also demonstrated prolonged mastication with minimal oral residue with solid textures, therefore, recommend patient continue  Dys. 3 textures. Patient would benefit from skilled SLP intervention to maximize cognitive and swallowing function and overall functional independence prior to discharge home.    Skilled Therapeutic Interventions          Administered a cognitive-linguistic evaluation and BSE. Please see above for details. Administered the MoCA-Blind in which the patient scored 19/22 points with a score of 18 or above considered normal, however, patient demonstrated deficits in the areas of  immediate and short-term recall. Patient also educated on the procedures for the water protocol but will need reinforcement of information.    SLP Assessment  Patient will need skilled Speech Lanaguage Pathology Services during CIR admission    Recommendations  SLP Diet Recommendations: Dysphagia 3 (Mech soft);Nectar;Free water protocol after oral care Liquid Administration via: Cup;No straw Medication Administration: Whole meds with puree Supervision: Patient able to self feed;Full supervision/cueing for compensatory strategies Compensations: Minimize environmental distractions;Slow rate;Small sips/bites;Follow solids with liquid Postural Changes and/or Swallow Maneuvers: Seated upright 90 degrees;Upright 30-60 min after meal Oral Care Recommendations: Oral care BID Recommendations for Other Services: Neuropsych consult Patient destination: Home Follow up Recommendations:  (TBD) Equipment Recommended: None recommended by SLP    SLP Frequency 3 to 5 out of 7 days   SLP Duration  SLP Intensity  SLP Treatment/Interventions 12/24/15  Minumum of 1-2 x/day, 30 to 90 minutes  Cognitive remediation/compensation;Cueing hierarchy;Functional tasks;Dysphagia/aspiration precaution training;Patient/family education;Therapeutic Activities;Environmental controls;Internal/external aids    Pain Pain Assessment Pain Assessment: No/denies pain  Prior Functioning Type of Home: House Available Help at Discharge: Family;Available 24 hours/day Vocation: Retired  Function:  Eating Eating   Modified Consistency Diet: Yes Eating Assist Level: More than reasonable amount of time;Supervision or verbal cues           Cognition Comprehension Comprehension assist level: Follows basic conversation/direction with extra time/assistive device  Expression   Expression assist level: Expresses basic needs/ideas: With extra time/assistive device  Social Interaction Social Interaction assist level:  Interacts appropriately with others with medication or extra time (anti-anxiety, antidepressant).  Problem Solving Problem solving assist level: Solves basic 90% of the time/requires cueing < 10% of the time  Memory Memory assist level: Recognizes or recalls 75 - 89% of the time/requires cueing 10 - 24% of the time   Short Term Goals: Week 1: SLP Short Term Goal 1 (Week 1): Patient will demonstrate complex problem solving for familiar tasks with Mod I.  SLP Short Term Goal 2 (Week 1): Patient will recall new, daily information with use of external aids with Mod I.  SLP Short Term Goal 3 (Week 1): Patient will consume current diet with minimal overt s/s of aspiration with Mod I for use of swallowing strategies.   Refer to Care Plan for Long Term Goals  Recommendations for other services: Neuropsych  Discharge Criteria: Patient will be discharged from SLP if patient refuses treatment 3 consecutive times without medical reason, if treatment goals not met, if there is a change in medical status, if patient makes no progress towards goals or if patient is discharged from hospital.  The above assessment, treatment plan, treatment alternatives and goals were discussed and mutually agreed upon: by patient  Eli Pattillo 12/16/2015, 4:58 PM

## 2015-12-16 NOTE — Evaluation (Signed)
Physical Therapy Assessment and Plan  Patient Details  Name: [name] MRN: [id number] Date of Birth: Mar 03, 1925  PT Diagnosis: Difficulty walking and Muscle weakness Rehab Potential: Fair ELOS: 7-10   Today's Date: 12/16/2015 PT Individual Time: 0321-2248 PT Individual Time Calculation (min): 85 min    Problem List:  Patient Active Problem List   Diagnosis Date Noted  . Hyperkalemia   . PAF (paroxysmal atrial fibrillation) ([place])   . History of GI bleed   . E. coli UTI   . Prediabetes   . Dysphagia   . Acute blood loss anemia   . Leukocytosis   . Chronic combined systolic and diastolic congestive heart failure ([place]) 12/15/2015  . Anemia of chronic kidney failure 12/15/2015  . Debilitated 12/15/2015  . Acute encephalopathy   . CKD (chronic kidney disease), stage III   . Bleeding gastrointestinal   . GI bleed 12/05/2015  . Acute lower GI bleeding   . Biventricular cardiac pacemaker in situ 10/11/2011  . Atrial fibrillation [facility])     Past Medical History:  Past Medical History  Diagnosis Date  . Atrial fibrillation (HCC)     Chronic  . Coronary artery disease 2004    Status post CABG with LIMA-LAD, SVG-CFX. Hx of anterior apical infarct.  . Renal insufficiency     Chronic  . Hypertension   . Hyperlipidemia   . Gout   . CHF (congestive heart failure) (HCC)     with chronic ischemic cardiomypathy. EF of 10-15%.  CHF due to systolic dysfunction. Clinically doing well.  [name] GERD (gastroesophageal reflux disease)   . Pacemaker     Medtronic biventricular pacemaker, serial number [id number]  . Type II diabetes mellitus ([place])     "been off my RX for ~ 2 yr" (03/20/2013)  . H/O hiatal hernia   . Arthritis     "right thumb" (03/20/2013)  . Depression   . Anxiety   . Bladder cancer ([place])     "low grade; grade 1; non-invasive" (03/20/2013)  . CKD (chronic kidney disease), stage III    Past Surgical History:  Past Surgical History  Procedure Laterality Date  .  Transurethral resection of prostate  1990's; 2004    "twice; injected chemo 1st time; didn't do that 2nd time" (03/20/2013)  . Nasal septum surgery    . US echocardiography  04-17-2009    Est EF 10-15%  . Cardiovascular stress test  04-17-2009    EF 29%  . Coronary artery bypass graft  02/19/2003    LIMA-LAD, SVG-CFX  . Carotid endarterectomy Right 1990's    "left side is still  100% blocked" (03/20/2013)  . Icd lead removal Left 03/01/2013    Procedure: ICD LEAD REMOVAL;  Surgeon: Evans Lance, MD;  Location: [place];  Service: Cardiovascular;  Laterality: Left;  . Pacemaker lead removal Left 03/01/2013    Procedure: PACEMAKER LEAD REMOVAL;  Surgeon: Evans Lance, MD;  Location: [place];  Service: Cardiovascular;  Laterality: Left;  . Pacemaker insertion N/A 03/01/2013    Procedure: INSERTION PACEMAKER LEAD;  Surgeon: Evans Lance, MD;  Location: [place];  Service: Cardiovascular;  Laterality: N/A;  . Hemorrhoid surgery      "lanced several years ago" (03/20/2013)  . Tonsillectomy and adenoidectomy  1933  . Cataract extraction w/ intraocular lens  implant, bilateral Bilateral ~ 2000  . Bi-ventricular pacemaker insertion N/A 03/20/2013    Procedure: BI-VENTRICULAR PACEMAKER INSERTION (CRT-P);  Surgeon: Evans Lance, MD; Medtronic biventricular  pacemaker, serial number [id number]; Laterality: Right  . Flexible sigmoidoscopy N/A 12/05/2015    Procedure: FLEXIBLE SIGMOIDOSCOPY;  Surgeon: [name], MD;  Location: [place];  Service: Endoscopy;  Laterality: N/A;    Assessment & Plan Clinical Impression:  An 82 y.o. right handed male with history of atrial fibrillation maintained on chronic Coumadin, coronary artery disease/Pacemaker status post CABG, systolic and diastolic congestive heart failure, type 2 diabetes mellitus, bladder cancer. Lives with wife in [place]. Reports to be independent prior to admission with a general decline over the past few months.Recent fall and hit  the corner of his eye on the sink. Presented 12/05/2015 with bright red blood from the rectum as well as a recent fall from the toilet approximately 1 week ago. Patient reports some recent constipation 2 weeks ago but denied any nausea vomiting or abdominal pain. Patient with ongoing active bleeding in the ED. Hemoglobin 6.5. INR 4.86. EKG showed new ST changes and T-wave inversions in the anterior leads. Troponin 0.20. Patient did receive vitamin K fresh frozen plasma 2 units and 1 unit of packed red blood cells. Gastroenterology services consulted Dr. Carlean Purl with findings of a single solitary ulcer in the rectum and in the distal rectum. Underwent epi injection and then cauterization. Close monitoring of hemoglobin status post PRBCs 8 with latest hemoglobin 8.3. Patient's chronic Coumadin currently remains on hold and presently on aspirin.Vascular study bilateral upper extremities 12/10/2015 showed superficial thrombus noted in the right basilic vein and in the left cephalic vein. He was placed on subcutaneous heparin for DVT prophylaxis and monitored. Current plan is to remain off Coumadin for 2 weeks per gastroenterology services. Follow-up cardiology services with echocardiogram showing ejection fraction of 35%. Akinesis of the anterior inferior lateral and apical myocardium. Placed on intravenous Lasix for acute on chronic systolic and diastolic heart failure and transition to by mouth. Close monitoring of creatinine 1.89-2.43. Tolerating a regular diet. Urine culture Escherichia coli presently maintained on Rocephin until 12/16/2015. Physical therapy evaluation completed 12/08/2015 for debilitation with recommendations of physical medicine rehabilitation consult. Patient was admitted for a comprehensive rehabilitation program 12/09/2015. Patient was slow progressive gains. On the evening of 12/11/2015 noted waxing and waning altered mental status and transient episode of being unresponsive. Cranial CT scan  negative. Patient was discharged to acute care services for ongoing workup. Neurology was consulted. EEG negative for seizure. Neurology was still concern of possible seizure clinically and was placed on Depakote. 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 CTA of head showed no emergent large vessel occlusion or high-grade stenosis. Subcutaneous heparin the patient had been initially placed on for superficial thrombus right basilic vein and left cephalic vein had been discontinued due to risk of bleeding. Mental status continued to improve. Palliative care was consult for goals of care. Diet was downgraded to a mechanical soft nectar thick liquid. Therapies resumed 12/14/2015. He was able to stand and walk short distances. Patient to be readmitted for comprehensive inpatient rehabilitation program. Patient transferred to CIR on 12/15/2015 .   Patient currently requires min with mobility secondary to muscle weakness, decreased cardiorespiratoy endurance and decreased sitting balance, decreased standing balance and decreased postural control.  Prior to hospitalization, patient was independent  with mobility and lived with Spouse in a House.  Home access is 3Stairs to enter with bilateral handrails.  Wife states both rails can be reached at same time.  Patient  will benefit from skilled PT intervention to maximize safe functional mobility and minimize fall risk for planned discharge home with 24 hour supervision.  Anticipate patient will benefit from follow up [place] at discharge.  PT - End of Session Activity Tolerance: Tolerates 10 - 20 min activity with multiple rests Endurance Deficit: Yes PT Assessment Rehab Potential (ACUTE/IP ONLY): Fair Barriers to Discharge: Inaccessible home environment PT Patient demonstrates impairments in the following area(s): Balance;Edema;Endurance;Motor PT Transfers Functional Problem(s): Bed  Mobility;Bed to Chair;Car;Furniture PT Locomotion Functional Problem(s): Ambulation;Stairs;Wheelchair Mobility PT Plan PT Intensity: Minimum of 1-2 x/day ,45 to 90 minutes PT Frequency: 5 out of 7 days PT Duration Estimated Length of Stay: 7-10 PT Treatment/Interventions: Ambulation/gait training;DME/adaptive equipment instruction;Neuromuscular re-education;Psychosocial support;Stair training;UE/LE Strength taining/ROM;Wheelchair propulsion/positioning;UE/LE Coordination activities;Therapeutic Activities;Discharge planning;Balance/vestibular training;Functional mobility training;Patient/family education;Therapeutic Exercise PT Transfers Anticipated Outcome(s): mod I PT Locomotion Anticipated Outcome(s): mod I except on stairs and in community PT Recommendation Follow Up Recommendations: Home health PT Patient destination: Home Equipment Recommended: To be determined  Skilled Therapeutic Intervention Pt received resting in bed with no c/o pain and agreeable to therapy session.  Pt initially perseverative on being able to drink something and schedule being messed up today but was easily redirected.  PT explained role of PT, goals of therapy, plan of care, and safety plan.  Skilled PT intervention initiated after initial assessment and focus on activity tolerance, transfers, balance, gait with RW, and stair negotiation.  Pt performs all mobility with min assist overall but demonstrates significant deficits in activity tolerance.  PT instructed pt in pursed lip breathing and pacing throughout session.  Pt returned to room at end of session and agreeable to sit up in chair while sheets were changed.  Pt left upright in w/c with call bell in reach and needs met.    PT Evaluation Pain Pain Assessment Pain Assessment: No/denies pain Home Living/Prior Functioning Home Living Available Help at Discharge: Family;Available 24 hours/day Type of Home: House Home Access: Stairs to enter [employer] of Steps: 3 Entrance Stairs-Rails: Can reach both;Right;Left Home Layout: One level Additional Comments: Pt was independent PTA and still driving, not using AD though wife reports he probably should have been Prior Function Level of Independence: Independent with gait;Independent with transfers  Able to Take Stairs?: Yes Driving: Yes Vocation: Retired  Associate Professor Overall Cognitive Status: Within Functional Limits for tasks assessed Arousal/Alertness: Awake/alert Orientation Level: Oriented X4 Safety/Judgment: Appears intact Sensation Sensation Light Touch: Appears Intact Coordination Gross Motor Movements are Fluid and Coordinated: Yes Fine Motor Movements are Fluid and Coordinated: Yes Motor  Motor Motor: Other (comment) Motor - Skilled Clinical Observations: generalized weakness throughout  Mobility Bed Mobility Bed Mobility: Supine to Sit;Sit to Supine Supine to Sit: 5: Supervision Sit to Supine: 5: Supervision Transfers Transfers: Yes Sit to Stand: 4: Min assist Sit to Stand Details: Tactile cues for weight shifting;Verbal cues for technique Stand to Sit: 4: Min guard Locomotion  Ambulation Ambulation: Yes Ambulation/Gait Assistance: 4: Min guard Ambulation Distance (Feet): 32 Feet Assistive device: Rolling walker Gait Gait: Yes Gait Pattern: Impaired Gait Pattern: Decreased step length - left;Decreased stance time - right;Decreased stride length;Left flexed knee in stance;Right flexed knee in stance;Trunk flexed Gait velocity: very slow Stairs / Additional Locomotion Stairs: Yes Stairs Assistance: 4: Min assist Stair Management Technique: Two rails Number of Stairs: 4 Height of Stairs: 6 Wheelchair Mobility Wheelchair Mobility: No  Trunk/Postural Assessment  Cervical Assessment Cervical Assessment: Within Functional Limits Thoracic Assessment Thoracic Assessment: Within Functional Limits Lumbar  Assessment Lumbar Assessment: Within  Functional Limits Postural Control Postural Control: Within Functional Limits  Balance Balance Balance Assessed: Yes Static Standing Balance Static Standing - Balance Support: Bilateral upper extremity supported Static Standing - Level of Assistance: 5: Stand by assistance Dynamic Standing Balance Dynamic Standing - Balance Support: During functional activity;Bilateral upper extremity supported Dynamic Standing - Level of Assistance: 4: Min assist Extremity Assessment      RLE Assessment RLE Assessment: Exceptions to [facility] RLE AROM (degrees) RLE Overall AROM Comments: WFL at hip and knee assessed in sitting RLE Strength Right Hip Flexion: 3+/5 Right Knee Flexion: 4+/5 Right Knee Extension: 4/5 LLE Assessment LLE Assessment: Exceptions to WFL LLE AROM (degrees) LLE Overall AROM Comments: WFL at hip and knee assessed in sitting LLE Strength Left Hip Flexion: 3+/5 Left Knee Flexion: 4+/5 Left Knee Extension: 4/5   See Function Navigator for Current Functional Status.   Refer to Care Plan for Long Term Goals  Recommendations for other services: None  Discharge Criteria: Patient will be discharged from PT if patient refuses treatment 3 consecutive times without medical reason, if treatment goals not met, if there is a change in medical status, if patient makes no progress towards goals or if patient is discharged from hospital.  The above assessment, treatment plan, treatment alternatives and goals were discussed and mutually agreed upon: by patient  [name] 12/16/2015, 4:29 PM

## 2015-12-16 NOTE — Evaluation (Signed)
Occupational Therapy Assessment and Plan  Patient Details  Name: [name] MRN: [id number] Date of Birth: [birth year]  OT Diagnosis: muscle weakness (generalized) Rehab Potential: Rehab Potential (ACUTE ONLY): Fair ELOS: 7-10 days   Today's Date: 12/16/2015 OT Individual Time: 0704-0800 OT Individual Time Calculation (min): 56 min     Problem List:  Patient Active Problem List   Diagnosis Date Noted  . Chronic combined systolic and diastolic congestive heart failure ([name]) 12/15/2015  . Anemia of chronic kidney failure 12/15/2015  . Debilitated 12/15/2015  . Acute encephalopathy   . CKD (chronic kidney disease), stage III   . Bleeding gastrointestinal   . GI bleed 12/05/2015  . Acute lower GI bleeding   . Biventricular cardiac pacemaker in situ 10/11/2011  . Atrial fibrillation [facility])     Past Medical History:  Past Medical History  Diagnosis Date  . Atrial fibrillation (HCC)     Chronic  . Coronary artery disease 2004    Status post CABG with LIMA-LAD, SVG-CFX. Hx of anterior apical infarct.  . Renal insufficiency     Chronic  . Hypertension   . Hyperlipidemia   . Gout   . CHF (congestive heart failure) (HCC)     with chronic ischemic cardiomypathy. EF of 10-15%.  CHF due to systolic dysfunction. Clinically doing well.  [name] GERD (gastroesophageal reflux disease)   . Pacemaker     Medtronic biventricular pacemaker, serial number [id number]  . Type II diabetes mellitus ([name])     "been off my RX for ~ 2 yr" (03/20/2013)  . H/O hiatal hernia   . Arthritis     "right thumb" (03/20/2013)  . Depression   . Anxiety   . Bladder cancer ([place])     "low grade; grade 1; non-invasive" (03/20/2013)  . CKD (chronic kidney disease), stage III    Past Surgical History:  Past Surgical History  Procedure Laterality Date  . Transurethral resection of prostate  1990's; 2004    "twice; injected chemo 1st time; didn't do that 2nd time" (03/20/2013)  . Nasal septum surgery    . US  echocardiography  04-17-2009    Est EF 10-15%  . Cardiovascular stress test  04-17-2009    EF 29%  . Coronary artery bypass graft  02/19/2003    LIMA-LAD, SVG-CFX  . Carotid endarterectomy Right 1990's    "left side is still  100% blocked" (03/20/2013)  . Icd lead removal Left 03/01/2013    Procedure: ICD LEAD REMOVAL;  Surgeon: Evans Lance, MD;  Location: [place];  Service: Cardiovascular;  Laterality: Left;  . Pacemaker lead removal Left 03/01/2013    Procedure: PACEMAKER LEAD REMOVAL;  Surgeon: Evans Lance, MD;  Location: [place];  Service: Cardiovascular;  Laterality: Left;  . Pacemaker insertion N/A 03/01/2013    Procedure: INSERTION PACEMAKER LEAD;  Surgeon: Evans Lance, MD;  Location: [place];  Service: Cardiovascular;  Laterality: N/A;  . Hemorrhoid surgery      "lanced several years ago" (03/20/2013)  . Tonsillectomy and adenoidectomy  1933  . Cataract extraction w/ intraocular lens  implant, bilateral Bilateral ~ 2000  . Bi-ventricular pacemaker insertion N/A 03/20/2013    Procedure: BI-VENTRICULAR PACEMAKER INSERTION (CRT-P);  Surgeon: Evans Lance, MD; Medtronic biventricular pacemaker, serial number [id number]; Laterality: Right  . Flexible sigmoidoscopy N/A 12/05/2015    Procedure: FLEXIBLE SIGMOIDOSCOPY;  Surgeon: [name], MD;  Location: [place];  Service: Endoscopy;  Laterality: N/A;    Assessment &  Plan Clinical Impression: Patient is an 82 y.o. year old male right handed male with history of atrial fibrillation maintained on chronic Coumadin, coronary artery disease/Pacemaker status post CABG, systolic and diastolic congestive heart failure, type 2 diabetes mellitus, bladder cancer. Lives with wife in [place]. Reports to be independent prior to admission with a general decline over the past few months.Recent fall and hit the corner of his eye on the sink. Presented 12/05/2015 with bright red blood from the rectum as well as a recent fall from the toilet  approximately 1 week ago. Patient reports some recent constipation 2 weeks ago but denied any nausea vomiting or abdominal pain. Patient with ongoing active bleeding in the ED. Hemoglobin 6.5. INR 4.86. EKG showed new ST changes and T-wave inversions in the anterior leads. Troponin 0.20. Patient did receive vitamin K fresh frozen plasma 2 units and 1 unit of packed red blood cells. Gastroenterology services consulted Dr. Carlean Purl with findings of a single solitary ulcer in the rectum and in the distal rectum. Underwent epi injection and then cauterization. Close monitoring of hemoglobin status post PRBCs 8 with latest hemoglobin 8.3. Patient's chronic Coumadin currently remains on hold and presently on aspirin.Vascular study bilateral upper extremities 12/10/2015 showed superficial thrombus noted in the right basilic vein and in the left cephalic vein. He was placed on subcutaneous heparin for DVT prophylaxis and monitored. Current plan is to remain off Coumadin for 2 weeks per gastroenterology services. Follow-up cardiology services with echocardiogram showing ejection fraction of 35%. Akinesis of the anterior inferior lateral and apical myocardium. Placed on intravenous Lasix for acute on chronic systolic and diastolic heart failure and transition to by mouth. Close monitoring of creatinine 1.89-2.43. Tolerating a regular diet. Urine culture Escherichia coli presently maintained on Rocephin until 12/16/2015. Physical therapy evaluation completed 12/08/2015 for debilitation with recommendations of physical medicine rehabilitation consult. Patient was admitted for a comprehensive rehabilitation program 12/09/2015. Patient was slow progressive gains. On the evening of 12/11/2015 noted waxing and waning altered mental status and transient episode of being unresponsive. Cranial CT scan negative. Patient was discharged to acute care services for ongoing workup. Neurology was consulted. EEG negative for seizure.  Neurology was still concern of possible seizure clinically and was placed on Depakote. 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 CTA of head showed no emergent large vessel occlusion or high-grade stenosis. Subcutaneous heparin the patient had been initially placed on for superficial thrombus right basilic vein and left cephalic vein had been discontinued due to risk of bleeding. Mental status continued to improve. Palliative care was consulted for goals of care. Diet was downgraded to a mechanical soft nectar thick liquid. Therapies resumed 12/14/2015. He was able to stand and walk short distances. Patient was readmitted for comprehensive rehabilitation program.  Patient transferred to CIR on 12/15/2015 .    Patient currently requires min with basic self-care skills secondary to muscle weakness, decreased cardiorespiratoy endurance and decreased standing balance and decreased postural control.  Prior to hospitalization, patient could complete ADLs and IADLs with independent .  Patient will benefit from skilled intervention to increase independence with basic self-care skills prior to discharge home with care partner.  Anticipate patient will require intermittent supervision and follow up home health.  OT - End of Session Activity Tolerance: Decreased this session Endurance Deficit: Yes Endurance Deficit Description: mutliple rest breaks secondary to fatigue OT Assessment Rehab Potential (ACUTE ONLY): Fair Barriers  to Discharge: Other (comment) Barriers to Discharge Comments: none known at this time OT Patient demonstrates impairments in the following area(s): Balance;Endurance;Motor;Safety OT Basic ADL's Functional Problem(s): Grooming;Bathing;Dressing;Toileting OT Advanced ADL's Functional Problem(s):  (n/a) OT Transfers Functional Problem(s): Toilet;Tub/Shower OT Additional Impairment(s): None OT Plan OT  Intensity: Minimum of 1-2 x/day, 45 to 90 minutes OT Frequency: 5 out of 7 days OT Duration/Estimated Length of Stay: 7-10 days OT Treatment/Interventions: Balance/vestibular training;Community reintegration;Discharge planning;Pain management;Neuromuscular re-education;Functional mobility training;DME/adaptive equipment instruction;Patient/family education;Therapeutic Activities;Therapeutic Exercise;Psychosocial support;Self Care/advanced ADL retraining;UE/LE Strength taining/ROM OT Self Feeding Anticipated Outcome(s): n/a OT Basic Self-Care Anticipated Outcome(s): mod I overall OT Toileting Anticipated Outcome(s): mod I  OT Bathroom Transfers Anticipated Outcome(s): mod I - toilet , supervision - shower OT Recommendation Recommendations for Other Services: Other (comment) (none) Patient destination: Home Follow Up Recommendations: Home health OT Equipment Recommended: To be determined   Skilled Therapeutic Intervention Upon entering the room, pt supine in bed with no c/o pain this session. OT educated pt on OT purpose, POC, and goals with pt in agreement. Pt ambulated with RW to bathroom with min A for balance. Pt engaged in bathing from shower level with min A for balance when standing to wash buttocks and peri area. Pt heavily fatigued once exiting the shower and needing 5 minute rest break secondary to fatigue. Pt dressing while seated on EOB and then returning to supine. Call bell and all needed items within reach upon exiting the room.   OT Evaluation Precautions/Restrictions  Precautions Precautions: Fall Restrictions Weight Bearing Restrictions: No Vital Signs Therapy Vitals Temp: 97.7 F (36.5 C) Temp Source: Oral Pulse Rate: 90 Resp: 20 BP: (!) 102/52 mmHg Patient Position (if appropriate): Lying Oxygen Therapy SpO2: 99 % O2 Device: Not Delivered Pain Pain Assessment Pain Assessment: No/denies pain Home Living/Prior Functioning Home Living Family/patient expects to  be discharged to:: Private residence Living Arrangements: Spouse/significant other Available Help at Discharge: Family, Available 24 hours/day Type of Home: House Home Access: Stairs to enter Technical brewer of Steps: 3 Entrance Stairs-Rails: Left Home Layout: One level Bathroom Shower/Tub: Walk-in shower, Door, Other (comment) (with built in seat) Bathroom Toilet: Handicapped height Bathroom Accessibility: Yes Additional Comments: Pt was independent PTA and still driving. His wife is 87 years younger than him.  Prior Function Level of Independence: Independent with basic ADLs, Independent with homemaking with ambulation, Independent with homemaking with wheelchair, Independent with gait Driving: Yes Vocation: Retired Vision/Perception  Vision- History Baseline Vision/History: Wears glasses Wears Glasses: Reading only Patient Visual Report: No change from baseline Vision- Assessment Vision Assessment?: No apparent visual deficits  Cognition Overall Cognitive Status: Impaired/Different from baseline Arousal/Alertness: Awake/alert Orientation Level: Person;Place;Situation Person: Oriented Place: Oriented Situation: Oriented Year: 2017 Month: May Day of Week: Correct Memory: Impaired Memory Impairment: Decreased recall of new information Attention: Selective Selective Attention: Appears intact Awareness: Appears intact Problem Solving: Impaired Problem Solving Impairment: Functional complex Safety/Judgment: Appears intact Sensation Sensation Light Touch: Appears Intact Stereognosis: Not tested Hot/Cold: Appears Intact Proprioception: Appears Intact Coordination Gross Motor Movements are Fluid and Coordinated: Yes Fine Motor Movements are Fluid and Coordinated: Yes Motor  Motor Motor: Other (comment) Motor - Skilled Clinical Observations: muscle weakness Mobility  Bed Mobility Bed Mobility: Rolling Right;Rolling Left;Supine to Sit Rolling Right: 5:  Supervision Rolling Left: 5: Supervision Supine to Sit: 5: Supervision;With rails;HOB elevated Supine to Sit Details: Verbal cues for precautions/safety;Verbal cues for technique  Trunk/Postural Assessment  Cervical Assessment Cervical Assessment: Within Functional Limits Thoracic Assessment Thoracic Assessment: Within Functional Limits  Lumbar Assessment Lumbar Assessment: Within Functional Limits Postural Control Postural Control: Within Functional Limits  Balance Balance Balance Assessed: Yes Static Standing Balance Static Standing - Balance Support: Bilateral upper extremity supported Static Standing - Level of Assistance: 5: Stand by assistance Dynamic Standing Balance Dynamic Standing - Balance Support: During functional activity;Bilateral upper extremity supported Dynamic Standing - Level of Assistance: 4: Min assist Extremity/Trunk Assessment RUE Assessment RUE Assessment: Exceptions to [facility] for tasks but "weeping" fluids and painful. No formal assessment performed) LUE Assessment LUE Assessment: Within Functional Limits   See Function Navigator for Current Functional Status.   Refer to Care Plan for Long Term Goals  Recommendations for other services: None  Discharge Criteria: Patient will be discharged from OT if patient refuses treatment 3 consecutive times without medical reason, if treatment goals not met, if there is a change in medical status, if patient makes no progress towards goals or if patient is discharged from hospital.  The above assessment, treatment plan, treatment alternatives and goals were discussed and mutually agreed upon: by patient  [name] 12/16/2015, 8:44 AM

## 2015-12-16 NOTE — Progress Notes (Signed)
PHYSICAL MEDICINE & REHABILITATION     PROGRESS NOTE  Subjective/Complaints:  Pt laying in bed this AM.  He states he had some shoulder pain overnight, but after he took medications, he did not have a problem sleeping.    ROS: Denies CP, SOB, N/V/D.   Objective: Vital Signs: Blood pressure 102/52, pulse 90, temperature 97.7 F (36.5 C), temperature source Oral, resp. rate 20, height 5\' 7"  (1.702 m), weight 49.5 kg (109 lb 2 oz), SpO2 99 %. No results found.  Recent Labs  12/14/15 2118 12/16/15 0519  WBC 9.4 11.9*  HGB 9.3* 10.2*  HCT 28.9* 31.9*  PLT 206 210    Recent Labs  12/14/15 2118 12/16/15 0519  NA 133* 136  K 4.8 5.9*  CL 102 106  GLUCOSE 71 106*  BUN 37* 37*  CREATININE 1.41* 1.55*  CALCIUM 7.6* 7.8*   CBG (last 3)   Recent Labs  12/15/15 1732 12/15/15 2059 12/16/15 0629  GLUCAP 162* 203* 103*    Wt Readings from Last 3 Encounters:  12/16/15 49.5 kg (109 lb 2 oz)  12/13/15 50 kg (110 lb 3.7 oz)  12/11/15 52.935 kg (116 lb 11.2 oz)    Physical Exam:  BP 102/52 mmHg  Pulse 90  Temp(Src) 97.7 F (36.5 C) (Oral)  Resp 20  Ht 5\' 7"  (1.702 m)  Wt 49.5 kg (109 lb 2 oz)  BMI 17.09 kg/m2  SpO2 99% Gen: NAD. Vital signs reviewed.  HENT: Bruising to left orbital area and side of face  Eyes: EOM are normal.  Cardiovascular: Cardiac rate controlled  Respiratory: Effort normal and breath sounds normal. No respiratory distress.  GI: Soft. Bowel sounds are normal. He exhibits no distension.  Neurological: He is alert and oriented.  Follows commands.  B/l UE grossly 4+/5 prox to distal.  B/l LE: 4-/5 HF, KE 4-, ADF/PF 4/5.   Skin: Skin is warm and dry.  Numerous bruises on his arms and legs.   Psychiatric: He has a normal mood and affect. His behavior is normal.   Assessment/Plan: 1. Functional deficits secondary to GI bleed status post epi injection and cauterization/multi-medical which require 3+ hours per day of interdisciplinary  therapy in a comprehensive inpatient rehab setting. Physiatrist is providing close team supervision and 24 hour management of active medical problems listed below. Physiatrist and rehab team continue to assess barriers to discharge/monitor patient progress toward functional and medical goals.  Function:  Bathing Bathing position      Bathing parts      Bathing assist        Upper Body Dressing/Undressing Upper body dressing                    Upper body assist        Lower Body Dressing/Undressing Lower body dressing                                  Lower body assist        Toileting Toileting          Toileting assist     Transfers Chair/bed transfer             Locomotion Ambulation           Wheelchair          Cognition Comprehension Comprehension assist level: Understands basic 90% of the time/cues < 10% of the time  Expression Expression assist level: Expresses basic 90% of the time/requires cueing < 10% of the time.  Social Interaction Social Interaction assist level: Interacts appropriately 90% of the time - Needs monitoring or encouragement for participation or interaction.  Problem Solving Problem solving assist level: Solves basic 75 - 89% of the time/requires cueing 10 - 24% of the time  Memory Memory assist level: Recognizes or recalls 90% of the time/requires cueing < 10% of the time    Medical Problem List and Plan: 1. Debilitation and resolving encephalopathy secondary to GI bleed status post epi injection and cauterization/multi-medical  -Begin CIR 2. DVT Prophylaxis/Anticoagulation: Superficial thrombus right basilic vein and left cephalic vein.   No anticoagulation at this time due to GI bleed.   Will consider follow-up Doppler study in future. 3. Pain Management: Low-dose oxycodone 5 mg every 4 hours as needed. Monitor mental status 4. Seizure prophylaxis. Depakote 750 mg every 12 hours. 5. Neuropsych: This  patient is capable of making decisions on his own behalf. 6. Skin/Wound Care: Routine skin checks, monitoring of scattered abrasions 7. Fluids/Electrolytes/Nutrition: Routine I&O's   Hyperkalemia: 5.9 on 5/3, kayexelate ordered  Labs ordered for tomorrow 8. CAD status post CABG. No chest pain or shortness of breath. 9. History of atrial fibrillation.   Coumadin remains on hold due to GI bleed consider resuming in TWO WEEKS IF NO FURTHER BLEEDING versus aspirin (?~5/10). Will clairfy start date  Continue Lanoxin 0.0625 mg daily 10. Systolic diastolic congestive heart failure. Monitor for any signs of fluid overload. 11. Escherichia coli urinary tract infection. Last day of Rocephin 12/16/2015  12. Gout. Zyloprim 300 mg daily 13. Hyperlipidemia. Pravachol 14. Hypotension:  Will encourage fluid intake 15. Prediabetes  Will cont to monitor 16. Dysphagia  Will advance diet as tolerated and transition to carb controlled diet 17. Acute blood loss anemia  Hb 10.2 on 5/3  Will cont to monitior 18. Leukocytosis  WBCs 11.9 on 5/3  Ucx pending from 5/2  Will cont to monitor 19. CKD  Cr 1.55 on 5/3  Will cont to monitor  LOS (Days) 1 A FACE TO FACE EVALUATION WAS PERFORMED  Ankit Lorie Phenix 12/16/2015 9:56 AM

## 2015-12-17 ENCOUNTER — Inpatient Hospital Stay (HOSPITAL_COMMUNITY): Payer: Medicare Other | Admitting: Speech Pathology

## 2015-12-17 ENCOUNTER — Inpatient Hospital Stay (HOSPITAL_COMMUNITY): Payer: Medicare Other

## 2015-12-17 ENCOUNTER — Inpatient Hospital Stay (HOSPITAL_COMMUNITY): Payer: Medicare Other | Admitting: Physical Therapy

## 2015-12-17 ENCOUNTER — Inpatient Hospital Stay (HOSPITAL_COMMUNITY): Payer: Medicare Other | Admitting: Occupational Therapy

## 2015-12-17 DIAGNOSIS — R0989 Other specified symptoms and signs involving the circulatory and respiratory systems: Secondary | ICD-10-CM | POA: Diagnosis not present

## 2015-12-17 DIAGNOSIS — I999 Unspecified disorder of circulatory system: Secondary | ICD-10-CM

## 2015-12-17 LAB — CBC WITH DIFFERENTIAL/PLATELET
BASOS ABS: 0 10*3/uL (ref 0.0–0.1)
BASOS PCT: 0 %
EOS ABS: 0.1 10*3/uL (ref 0.0–0.7)
EOS PCT: 1 %
HCT: 32.3 % — ABNORMAL LOW (ref 39.0–52.0)
HEMOGLOBIN: 10.2 g/dL — AB (ref 13.0–17.0)
LYMPHS ABS: 1.2 10*3/uL (ref 0.7–4.0)
Lymphocytes Relative: 9 %
MCH: 31 pg (ref 26.0–34.0)
MCHC: 31.6 g/dL (ref 30.0–36.0)
MCV: 98.2 fL (ref 78.0–100.0)
Monocytes Absolute: 1.1 10*3/uL — ABNORMAL HIGH (ref 0.1–1.0)
Monocytes Relative: 8 %
NEUTROS PCT: 82 %
Neutro Abs: 11.4 10*3/uL — ABNORMAL HIGH (ref 1.7–7.7)
PLATELETS: 182 10*3/uL (ref 150–400)
RBC: 3.29 MIL/uL — AB (ref 4.22–5.81)
RDW: 19.9 % — ABNORMAL HIGH (ref 11.5–15.5)
WBC: 13.9 10*3/uL — AB (ref 4.0–10.5)

## 2015-12-17 LAB — BASIC METABOLIC PANEL
Anion gap: 12 (ref 5–15)
BUN: 41 mg/dL — ABNORMAL HIGH (ref 6–20)
CHLORIDE: 102 mmol/L (ref 101–111)
CO2: 24 mmol/L (ref 22–32)
Calcium: 8 mg/dL — ABNORMAL LOW (ref 8.9–10.3)
Creatinine, Ser: 1.52 mg/dL — ABNORMAL HIGH (ref 0.61–1.24)
GFR, EST AFRICAN AMERICAN: 45 mL/min — AB (ref >60)
GFR, EST NON AFRICAN AMERICAN: 39 mL/min — AB (ref >60)
Glucose, Bld: 145 mg/dL — ABNORMAL HIGH (ref 65–99)
POTASSIUM: 5.4 mmol/L — AB (ref 3.5–5.1)
SODIUM: 138 mmol/L (ref 135–145)

## 2015-12-17 LAB — CULTURE, BLOOD (ROUTINE X 2)
Culture: NO GROWTH
Culture: NO GROWTH

## 2015-12-17 LAB — GLUCOSE, CAPILLARY
GLUCOSE-CAPILLARY: 108 mg/dL — AB (ref 65–99)
GLUCOSE-CAPILLARY: 154 mg/dL — AB (ref 65–99)
Glucose-Capillary: 206 mg/dL — ABNORMAL HIGH (ref 65–99)
Glucose-Capillary: 216 mg/dL — ABNORMAL HIGH (ref 65–99)

## 2015-12-17 MED ORDER — RESOURCE INSTANT PROTEIN PO PWD PACKET
1.0000 | Freq: Three times a day (TID) | ORAL | Status: DC
  Filled 2015-12-17: qty 6

## 2015-12-17 MED ORDER — BENEPROTEIN PO POWD
1.0000 | Freq: Three times a day (TID) | ORAL | Status: DC
  Administered 2015-12-17 – 2015-12-24 (×18): 6 g via ORAL
  Filled 2015-12-17: qty 227

## 2015-12-17 MED ORDER — SODIUM POLYSTYRENE SULFONATE 15 GM/60ML PO SUSP
30.0000 g | Freq: Once | ORAL | Status: AC
Start: 2015-12-17 — End: 2015-12-17
  Administered 2015-12-17: 30 g via ORAL
  Filled 2015-12-17 (×2): qty 120

## 2015-12-17 NOTE — Progress Notes (Signed)
*  PRELIMINARY RESULTS* Vascular Ultrasound Bilateral Upper Extremity Arterial Duplex has been completed.  Study was technically difficult and limited due to IV location. Bilateral upper extremity arteries are patent with biphasic flow. There is no obvious evidence of focal stenosis involving visualized segments of bilateral upper extremity arteries.  12/17/2015 4:59 PM Maudry Mayhew, RVT, RDCS, RDMS

## 2015-12-17 NOTE — Progress Notes (Signed)
Occupational Therapy Session Note  Patient Details  Name: CLEM MARKEN MRN: [id number] Date of Birth: 02-04-1925  Today's Date: 12/17/2015 OT Individual Time: 0705-0800 OT Individual Time Calculation (min): 55 min    Short Term Goals: Week 1:  OT Short Term Goal 1 (Week 1): STGs=LTGS secondary to short estimated LOS  Skilled Therapeutic Interventions/Progress Updates:    Upon entering the room, pt supine in bed with no c/o pain but reports, "I am so tired from yesterday." Pt encouraged to participate in therapy this morning. Pt performed supine >sit with supervision. Pt declined bathing and toileting this session. Pt dressed while seated on EOB with rest breaks and min A for balance for safety. Pt transferring into recliner chair with min hand held assistance. Pt eating breakfast with set up to open containers and supervision for meal with min verbal cues to adhere to swallowing strategies. Cal bell and all needed items within reach upon exiting the room.   Therapy Documentation Precautions:  Precautions Precautions: Fall Restrictions Weight Bearing Restrictions: No General:   Vital Signs: Therapy Vitals Temp: 98.2 F (36.8 C) Temp Source: Oral Pulse Rate: (!) 102 Resp: 18 BP: (!) 112/49 mmHg Patient Position (if appropriate): Lying Oxygen Therapy SpO2: 99 % O2 Device: Not Delivered  See Function Navigator for Current Functional Status.   Therapy/Group: Individual Therapy  [name] 12/17/2015, 7:47 AM Up

## 2015-12-17 NOTE — Progress Notes (Signed)
[place] PHYSICAL MEDICINE & REHABILITATION     PROGRESS NOTE  Subjective/Complaints:  Patient seen sitting up in his chair eating breakfast. He states he enjoys this meal the best. Per nursing, labile BP's due to readings from legs.  ROS: + Weeping from upper extremities. Denies CP, SOB, N/V/D.   Objective: Vital Signs: Blood pressure 112/49, pulse 103, temperature 98.2 F (36.8 C), temperature source Oral, resp. rate 18, height 5\' 7"  (1.702 m), weight 47.174 kg (104 lb), SpO2 99 %. No results found.  Recent Labs  12/16/15 0519 12/17/15 0512  WBC 11.9* 13.9*  HGB 10.2* 10.2*  HCT 31.9* 32.3*  PLT 210 182    Recent Labs  12/16/15 0519 12/17/15 0512  NA 136 138  K 5.9* 5.4*  CL 106 102  GLUCOSE 106* 145*  BUN 37* 41*  CREATININE 1.55* 1.52*  CALCIUM 7.8* 8.0*   CBG (last 3)   Recent Labs  12/16/15 1712 12/16/15 2200 12/17/15 0650  GLUCAP 152* 167* 108*    Wt Readings from Last 3 Encounters:  12/17/15 47.174 kg (104 lb)  12/13/15 50 kg (110 lb 3.7 oz)  12/11/15 52.935 kg (116 lb 11.2 oz)    Physical Exam:  BP 112/49 mmHg  Pulse 103  Temp(Src) 98.2 F (36.8 C) (Oral)  Resp 18  Ht 5\' 7"  (1.702 m)  Wt 47.174 kg (104 lb)  BMI 16.28 kg/m2  SpO2 99% Gen: NAD. Vital signs reviewed.  HENT: Bruising to left orbital area and side of face  Eyes: EOM are normal.  Cardiovascular: Cardiac rate controlled  Respiratory: Effort normal and breath sounds normal. No respiratory distress.  GI: Soft. Bowel sounds are normal. He exhibits no distension.  Neurological: He is alert and oriented.  Follows commands.  B/l UE grossly 4+/5 prox to distal.  B/l LE: 4-/5 HF, KE 4-, ADF/PF 4/5.   Skin: Skin is warm and dry.  Numerous bruises on his arms and legs.   Weeping from right proximal forearm  Bluish discoloration of distal upper extremities  Psychiatric: He has a normal mood and affect. His behavior is normal.   Assessment/Plan: 1. Functional deficits secondary  to GI bleed status post epi injection and cauterization/multi-medical which require 3+ hours per day of interdisciplinary therapy in a comprehensive inpatient rehab setting. Physiatrist is providing close team supervision and 24 hour management of active medical problems listed below. Physiatrist and rehab team continue to assess barriers to discharge/monitor patient progress toward functional and medical goals.  Function:  Bathing Bathing position Bathing activity did not occur: Refused Position: Production manager parts bathed by patient: Right arm, Left arm, Chest, Abdomen, Front perineal area, Buttocks, Right upper leg, Left upper leg Body parts bathed by helper: Right lower leg, Left lower leg, Back  Bathing assist Assist Level: Touching or steadying assistance(Pt > 75%)      Upper Body Dressing/Undressing Upper body dressing   What is the patient wearing?: Pull over shirt/dress     Pull over shirt/dress - Perfomed by patient: Thread/unthread right sleeve, Thread/unthread left sleeve, Put head through opening, Pull shirt over trunk          Upper body assist Assist Level: Set up      Lower Body Dressing/Undressing Lower body dressing   What is the patient wearing?: Pants     Pants- Performed by patient: Pull pants up/down Pants- Performed by helper: Thread/unthread right pants leg, Thread/unthread left pants leg   Non-skid slipper socks- Performed by helper:  Don/doff right sock, Don/doff left sock                  Lower body assist Assist for lower body dressing: Touching or steadying assistance (Pt > 75%)      Toileting Toileting   Toileting steps completed by patient: Adjust clothing prior to toileting, Performs perineal hygiene, Adjust clothing after toileting      Toileting assist Assist level: Touching or steadying assistance (Pt.75%)   Transfers Chair/bed transfer   Chair/bed transfer method: Ambulatory, Stand pivot Chair/bed transfer assist  level: Touching or steadying assistance (Pt > 75%) Chair/bed transfer assistive device: Armrests, Medical sales representative     Max distance: 32 Assist level: Touching or steadying assistance (Pt > 75%)   Wheelchair   Type: Manual Max wheelchair distance: 200 Assist Level: Dependent (Pt equals 0%)  Cognition Comprehension Comprehension assist level: Follows basic conversation/direction with extra time/assistive device  Expression Expression assist level: Expresses basic needs/ideas: With extra time/assistive device  Social Interaction Social Interaction assist level: Interacts appropriately with others with medication or extra time (anti-anxiety, antidepressant).  Problem Solving Problem solving assist level: Solves basic 90% of the time/requires cueing < 10% of the time  Memory Memory assist level: Recognizes or recalls 75 - 89% of the time/requires cueing 10 - 24% of the time    Medical Problem List and Plan: 1. Debilitation and resolving encephalopathy secondary to GI bleed status post epi injection and cauterization/multi-medical  -Continue CIR 2. DVT Prophylaxis/Anticoagulation: Superficial thrombus right basilic vein and left cephalic vein.   No anticoagulation at this time due to GI bleed.   Will consider follow-up Doppler study in future. 3. Pain Management: Low-dose oxycodone 5 mg every 4 hours as needed. Monitor mental status 4. Seizure prophylaxis. Depakote 750 mg every 12 hours. 5. Neuropsych: This patient is capable of making decisions on his own behalf. 6. Skin/Wound Care: Routine skin checks, monitoring of scattered abrasions 7. Fluids/Electrolytes/Nutrition: Routine I&O's   Hyperkalemia: 5.4 on 5/4, kayexelate ordered again  Labs ordered for tomorrow 8. CAD status post CABG. No chest pain or shortness of breath. 9. History of atrial fibrillation.   Coumadin remains on hold due to GI bleed consider resuming in 2 weeks if no further bleeding versus aspirin  (?~5/10).   Continue Lanoxin 0.0625 mg daily 10. Systolic diastolic congestive heart failure. Monitor for any signs of fluid overload.  Weight stable 11. Escherichia coli urinary tract infection. Last day of Rocephin 12/16/2015  12. Gout. Zyloprim 300 mg daily 13. Hyperlipidemia. Pravachol 14. Hypotension:  Will encourage fluid intake 15. Prediabetes  Will cont to monitor 16. Dysphagia  Will advance diet as tolerated and transition to carb controlled diet 17. Acute blood loss anemia  Hb 10.2 on 5/4  Will cont to monitior 18. Leukocytosis  WBCs 13.9 on 5/4  Ucx no growth from 5/2  Will order repeat chest x-ray  Will cont to monitor 19. CKD  Cr 1.52 on 5/4  Will cont to monitor  LOS (Days) 2 A FACE TO FACE EVALUATION WAS PERFORMED  [name] 12/17/2015 10:40 AM

## 2015-12-17 NOTE — Progress Notes (Signed)
Speech Language Pathology Daily Session Note  Patient Details  Name: [name] MRN: [id number] Date of Birth: 1924-11-09  Today's Date: 12/17/2015 SLP Individual Time: 1200-1235 SLP Individual Time Calculation (min): 35 min  Short Term Goals: Week 1: SLP Short Term Goal 1 (Week 1): Patient will demonstrate complex problem solving for familiar tasks with Mod I.  SLP Short Term Goal 2 (Week 1): Patient will recall new, daily information with use of external aids with Mod I.  SLP Short Term Goal 3 (Week 1): Patient will consume current diet with minimal overt s/s of aspiration with Mod I for use of swallowing strategies.   Skilled Therapeutic Interventions: Pt seen for followup dysphagia therapy after completion of MBSS. Showed pt the MBSS explaining challenges of choosing the most appropriate diet. Pt also verbalized difficulty with PO requiring mastication and requested consideration for full liquid diet. Given risk of aspiration with all consistencies, will proceed with full liquid diet and attempt to maximize pt safety with aspiration precautions. Pt able to demonstrate intermittent throat clear and dry swallow with min verbal cueing with thin liquids via cup sip. Intermittent wet vocal quality noted, but eliminated with throat clear. Educated re: [place]- pt able to demonstrate.   Function:  Eating Eating   Modified Consistency Diet: Yes Eating Assist Level: Supervision or verbal cues;More than reasonable amount of time           Cognition Comprehension Comprehension assist level: Follows basic conversation/direction with extra time/assistive device  Expression   Expression assist level: Expresses basic needs/ideas: With extra time/assistive device  Social Interaction    Problem Solving Problem solving assist level: Solves basic 90% of the time/requires cueing < 10% of the time  Memory Memory assist level: Recognizes or recalls 75 - 89% of the time/requires cueing 10 - 24% of the  time    Pain Pain Assessment Pain Assessment: No/denies pain  Therapy/Group: Individual Therapy  [place], CCC-SLP 12/17/2015, 3:57 PM

## 2015-12-17 NOTE — Progress Notes (Signed)
Physical Therapy Session Note  Patient Details  Name: ESMOND HINCH MRN: [id number] Date of Birth: 26-Jul-1925  Today's Date: 12/17/2015 PT Individual Time: 1020-1105 PT Individual Time Calculation (min): 45 min   Short Term Goals: Week 1:  PT Short Term Goal 1 (Week 1): =LTGs due to ELOS  Skilled Therapeutic Interventions/Progress Updates:    Pt received resting in bed and agreeable to therapy session, no c/o pain.  Pt propelled w/c to therapy gym with supervision and increased time.  Gait training x75' with RW and close supervision/steady assist with verbal cues for breathing and step length.  PT instructed pt in BLE therex x8 reps with 1# ankle weights for LAQ, hip flexion, and heel/toe raises.  Pt required extended seated rest breaks throughout session 2/2 fatigue.  Pt returned to room at end of session and transitioned to supine in bed with supervision and left with call bell in reach and needs met.   Therapy Documentation Precautions:  Precautions Precautions: Fall Restrictions Weight Bearing Restrictions: No   See Function Navigator for Current Functional Status.   Therapy/Group: Individual Therapy  [name] 12/17/2015, 3:53 PM

## 2015-12-17 NOTE — Progress Notes (Signed)
Speech Language Pathology Note  Patient Details  Name: [name] MRN: [id number] Date of Birth: Oct 08, 1924 Today's Date: 12/17/2015  MBSS complete. Full report located under chart review in imaging section.    [place], CCC-SLP  12/17/2015, 3:59 PM

## 2015-12-17 NOTE — Progress Notes (Addendum)
   Daily Progress Note  After briefly reviewing this patient's chart, it is readily evident that he is a poor surgical risk.  I would start with BUE doppler studies.    - Full consult to follow  Adele Barthel, MD [facility] Office: [phone] Pager: [phone]  12/17/2015, 2:11 PM   Addendum  Pt not at bedside when I came by to see him.  Will try to catch him tomorrow.   Awaiting non-invasive studies.  Adele Barthel, MD [facility] Office: [phone] Pager: [phone]  12/17/2015, 4:19 PM

## 2015-12-17 NOTE — Progress Notes (Signed)
Physical Therapy Session Note  Patient Details  Name: [name] MRN: [id number] Date of Birth: 05-21-1925  Today's Date: 12/17/2015 PT Individual Time: 1020-1105 PT Individual Time Calculation (min): 45 min   Short Term Goals: Week 1:  PT Short Term Goal 1 (Week 1): =LTGs due to ELOS  Skilled Therapeutic Interventions/Progress Updates:    Pt received resting in bed with no c/o pain, but citing fatigue, and agreeable to therapy session.  Pt more confused this session compared to previous session, asking if he was going to ride in the bed to therapy gym, and stating that he would turn 71 on June 6, but that he was 82 years old on D-day.  Pt transitioned to EOB with supervision and bed rails and performed ambulatory transfer to w/c with RW and supervision.  Pt propelled w/c to therapy gym with increased time and supervision.  PT instructed pt in car transfer with supervision and verbal cues for safe sequencing.  Pt performed 10 minutes on nustep for overall endurance and strengthening with verbal cues for pacing and pursed lip breathing.  Pt required rest breaks at 2:30 minutes, 6:00 minutes, and 10:33 minutes.  Pt transitioned back to w/c and PT propelled to room for energy conservation and time management.  Pt transferred into recliner with RW and supervision, positioned to comfort with call bell in reach and needs met.   Therapy Documentation Precautions:  Precautions Precautions: Fall Restrictions Weight Bearing Restrictions: No   See Function Navigator for Current Functional Status.   Therapy/Group: Individual Therapy  [name] E Penven-Crew 12/17/2015, 10:52 AM

## 2015-12-17 NOTE — Consult Note (Addendum)
CONSULT NOTE   MRN : [id number]  Reason for Consult: Bilateral UE cold Referring Physician:   History of Present Illness: 82 y/o male with past medical history of Afib, Bladder Cancer, AVB, CABG, Lower GIB with Rectal ulcer, failure to thrive and decline at home admitted on 12/12/2015 with altered mental status, readmitted from acute inpatient rehab.  We have been asked to evaluate his UE arterial flow.  He has a known superficial thrombosis noted in the right basilic vein and in the left cephalic vein.  The patient notes no pain in hands.  He notes no loss in sensation.  He does note weakness in both hands.  He is unaware of any discoloration in his hands.     Current Facility-Administered Medications  Medication Dose Route Frequency Provider Last Rate Last Dose  . acetaminophen (TYLENOL) tablet 650 mg  650 mg Oral Q6H PRN Lavon Paganini Angiulli, PA-C   650 mg at 12/15/15 2315  . allopurinol (ZYLOPRIM) tablet 300 mg  300 mg Oral Daily Lavon Paganini Angiulli, PA-C   300 mg at 12/17/15 0830  . artificial tears (LACRILUBE) ophthalmic ointment   Both Eyes QHS PRN Bary Leriche, PA-C   1 application at [id number]  . digoxin (LANOXIN) tablet 0.0625 mg  0.0625 mg Oral Daily Lavon Paganini Angiulli, PA-C   0.0625 mg at 12/17/15 0830  . divalproex (DEPAKOTE) DR tablet 750 mg  750 mg Oral Q12H Daniel J Angiulli, PA-C   750 mg at 12/17/15 0830  . feeding supplement (ENSURE ENLIVE) (ENSURE ENLIVE) liquid 237 mL  237 mL Oral BID BM Ankit Lorie Phenix, MD   237 mL at 12/17/15 1452  . ondansetron (ZOFRAN) tablet 4 mg  4 mg Oral Q6H PRN Lavon Paganini Angiulli, PA-C       Or  . ondansetron (ZOFRAN) injection 4 mg  4 mg Intravenous Q6H PRN Lavon Paganini Angiulli, PA-C      . oxyCODONE (Oxy IR/ROXICODONE) immediate release tablet 5 mg  5 mg Oral Q4H PRN Lavon Paganini Angiulli, PA-C   5 mg at 12/16/15 0230  . pravastatin (PRAVACHOL) tablet 20 mg  20 mg Oral q1800 Lavon Paganini Angiulli, PA-C   20 mg at 12/16/15 1820  . protein supplement  (RESOURCE BENEPROTEIN) powder 6 g  1 scoop Oral TID WC Ankit Lorie Phenix, MD   6 g at 12/17/15 1222  . RESOURCE THICKENUP CLEAR   Oral PRN Lavon Paganini Angiulli, PA-C      . sorbitol 70 % solution 30 mL  30 mL Oral Daily PRN Lavon Paganini Angiulli, PA-C        Pt meds include: Statin :Yes Betablocker: No ASA: No Other anticoagulants/antiplatelets: none  Past Medical History  Diagnosis Date  . Atrial fibrillation (HCC)     Chronic  . Coronary artery disease 2004    Status post CABG with LIMA-LAD, SVG-CFX. Hx of anterior apical infarct.  . Renal insufficiency     Chronic  . Hypertension   . Hyperlipidemia   . Gout   . CHF (congestive heart failure) (HCC)     with chronic ischemic cardiomypathy. EF of 10-15%.  CHF due to systolic dysfunction. Clinically doing well.  [name] GERD (gastroesophageal reflux disease)   . Pacemaker     Medtronic biventricular pacemaker, serial number [id number]  . Type II diabetes mellitus ([place])     "been off my RX for ~ 2 yr" (03/20/2013)  . H/O hiatal hernia   . Arthritis     "  right thumb" (03/20/2013)  . Depression   . Anxiety   . Bladder cancer ([place])     "low grade; grade 1; non-invasive" (03/20/2013)  . CKD (chronic kidney disease), stage III     Past Surgical History  Procedure Laterality Date  . Transurethral resection of prostate  1990's; 2004    "twice; injected chemo 1st time; didn't do that 2nd time" (03/20/2013)  . Nasal septum surgery    . US echocardiography  04-17-2009    Est EF 10-15%  . Cardiovascular stress test  04-17-2009    EF 29%  . Coronary artery bypass graft  02/19/2003    LIMA-LAD, SVG-CFX  . Carotid endarterectomy Right 1990's    "left side is still  100% blocked" (03/20/2013)  . Icd lead removal Left 03/01/2013    Procedure: ICD LEAD REMOVAL;  Surgeon: Evans Lance, MD;  Location: [facility];  Service: Cardiovascular;  Laterality: Left;  . Pacemaker lead removal Left 03/01/2013    Procedure: PACEMAKER LEAD REMOVAL;  Surgeon: Evans Lance, MD;   Location: [facility];  Service: Cardiovascular;  Laterality: Left;  . Pacemaker insertion N/A 03/01/2013    Procedure: INSERTION PACEMAKER LEAD;  Surgeon: Evans Lance, MD;  Location: [facility];  Service: Cardiovascular;  Laterality: N/A;  . Hemorrhoid surgery      "lanced several years ago" (03/20/2013)  . Tonsillectomy and adenoidectomy  1933  . Cataract extraction w/ intraocular lens  implant, bilateral Bilateral ~ 2000  . Bi-ventricular pacemaker insertion N/A 03/20/2013    Procedure: BI-VENTRICULAR PACEMAKER INSERTION (CRT-P);  Surgeon: Evans Lance, MD; Medtronic biventricular pacemaker, serial number [id number]; Laterality: Right  . Flexible sigmoidoscopy N/A 12/05/2015    Procedure: FLEXIBLE SIGMOIDOSCOPY;  Surgeon: [name], MD;  Location: [facility];  Service: Endoscopy;  Laterality: N/A;    Social History Social History  Substance Use Topics  . Smoking status: Former Smoker -- 1.00 packs/day for 17 years    Types: Cigarettes    Quit date: 02/28/1960  . Smokeless tobacco: Never Used  . Alcohol Use: Yes     Comment: 03/20/2013 "been about 2-3 months since I've had alcohol; never been a heavy drinker; was 1-2 drinks before dinner probably 5 nights/wk"    Family History Family History  Problem Relation Age of Onset  . Heart attack Mother     Allergies  Allergen Reactions  . Penicillins Hives and Itching    Has patient had a PCN reaction causing immediate rash, facial/tongue/throat swelling, SOB or lightheadedness with hypotension: YES Has patient had a PCN reaction causing severe rash involving mucus membranes or skin necrosis: NO Has patient had a PCN reaction that required hospitalizationNO Has patient had a PCN reaction occurring within the last 10 years: NO If all of the above answers are "NO", then may proceed with Cephalosporin use.     REVIEW OF SYSTEMS  General: [ ]  Weight loss, [ ]  Fever, [ ]  chills Neurologic: [x ] Dizziness, [ ]  Blackouts, [ ]  Seizure [ ]   Stroke, [ ]  "Mini stroke", [ ]  Slurred speech, [ ]  Temporary blindness; [ ]  weakness in arms or legs, [ ]  Hoarseness [ ]  Dysphagia Cardiac: [ ]  Chest pain/pressure, [ ]  Shortness of breath at rest [ ]  Shortness of breath with exertion, [ ]  Atrial fibrillation or irregular heartbeat  Vascular: [ ]  Pain in legs with walking, [ ]  Pain in legs at rest, [ ]  Pain in legs at night,  [ ]  Non-healing ulcer, [x ]  Blood clot in vein/DVT,   Pulmonary: [ ]  Home oxygen, [ ]  Productive cough, [ ]  Coughing up blood, [ ]  Asthma,  [ ]  Wheezing [ ]  COPD Musculoskeletal:  [x ] Arthritis, [ ]  Low back pain, [ ]  Joint pain Hematologic: [ ]  Easy Bruising, [ ]  Anemia; [ ]  Hepatitis Gastrointestinal: [ ]  Blood in stool, [ ]  Gastroesophageal Reflux/heartburn, Urinary: [x ] chronic Kidney disease, [ ]  on HD - [ ]  MWF or [ ]  TTHS, [ ]  Burning with urination, [ ]  Difficulty urinating Skin: [ ]  Rashes, [ ]  Wounds Psychological: [ ]  Anxiety, [ ]  Depression  Physical Examination Filed Vitals:   12/17/15 0330 12/17/15 0600 12/17/15 0830 12/17/15 1320  BP: 105/46 112/49  136/101  Pulse:  102 103 94  Temp:  98.2 F (36.8 C)    TempSrc:  Oral    Resp:  18  18  Height:      Weight:  104 lb (47.174 kg)    SpO2:  99%     Body mass index is 16.28 kg/(m^2).  General:  WDWN in NAD HENT: WNL Eyes: Pupils equal Pulmonary: normal non-labored breathing , without Rales, rhonchi,  wheezing Cardiac: RRR, without  Murmurs, rubs or gallops; No carotid bruits Abdomen: soft, NT, no masses Skin: no rashes, ulcers noted;  no Gangrene , no cellulitis; no open wounds; Ecchymosis left face, right arm Vascular Exam/Pulses:Palapble brachial pulses bilaterally, palpable left radial pulses.  Radial, ulnar and palmar arch monophasic right UE.  Biphasic radial and ulnar doppler signals.   Musculoskeletal: no muscle wasting or atrophy 4+/5 bilateral UE, 4/5 bilateral LE MMT  Neurologic: A&O X 3; Appropriate Affect; SENSATION: normal; Speech  is fluent/normal Psychiatric: Judgment intact, Mood & affect appropriate for pt's clinical situation Lymph : No Cervical, Axillary, or Inguinal lymphadenopathy    Significant Diagnostic Studies: CBC Lab Results  Component Value Date   WBC 13.9* 12/17/2015   HGB 10.2* 12/17/2015   HCT 32.3* 12/17/2015   MCV 98.2 12/17/2015   PLT 182 12/17/2015    BMET    Component Value Date/Time   NA 138 12/17/2015 0512   K 5.4* 12/17/2015 0512   CL 102 12/17/2015 0512   CO2 24 12/17/2015 0512   GLUCOSE 145* 12/17/2015 0512   BUN 41* 12/17/2015 0512   CREATININE 1.52* 12/17/2015 0512   CALCIUM 8.0* 12/17/2015 0512   GFRNONAA 39* 12/17/2015 0512   GFRAA 45* 12/17/2015 0512   Estimated Creatinine Clearance: 22.4 mL/min (by C-G formula based on Cr of 1.52).  COAG Lab Results  Component Value Date   INR 1.47 12/11/2015   INR 1.66* 12/10/2015   INR 1.65* 12/09/2015    Bilateral Upper Extremity Arterial Duplex (12/17/2015) Study was technically difficult and limited due to IV location. Bilateral upper extremity arteries are patent with biphasic flow. There is no obvious evidence of focal stenosis involving visualized segments of bilateral upper extremity arteries.   ASSESSMENT/PLAN:  No evidence of BUE PAD Possible Raynaud's Phenomena CAD  CHF with low EF CKD Stage 3 DM  Right UE monophasic doppler flow to the left UE with palpable brachial pulse.  He has full active range of motion of his UE's and sensation is intact. He does have a history of A fib.  We will order an arterial Duplex of the UE to help delineate the decrease in the left UE flow.   Laurence Slate [facility] 12/17/2015 3:03 PM   Addendum  I have independently interviewed and examined the patient,  and I agree with the physician assistant's findings.  Pt has mild cyanosis in his right hand with markedly decreased temperature in that hand.  His arterial duplex however demonstrates normal biphasic flow in both arms.  This  suggests a non-arterial etiology for the weakness in this patient's arms and hands.  In regards to the right hand coolness and cyanotic finger tip, it is unclear if this patient has some degree of Raynaud's phenomena.  As this is a diagnosis of exclusion, further work up is usually necessary to make the diagnosis.  Regardless, usually Raynaud's is self limiting.  There is no good treatment though some have tried low dose calcium channel blockers.  No vascular surgical intervention necessary in this patient.  Available as needed.  Adele Barthel, MD [facility] Office: [phone] Pager: [phone]  12/17/2015, 6:15 PM

## 2015-12-17 NOTE — Progress Notes (Signed)
Talked with Dr. Posey Pronto this AM while he was on rounds about pt's increased HR and lower BP readings tonight. We are taking BP's in the legs. No new orders received. Passed onto day shift RN. Kennieth Francois, RN

## 2015-12-17 NOTE — Patient Care Conference (Signed)
Inpatient RehabilitationTeam Conference and Plan of Care Update Date: 12/16/2015   Time: 2:45 PM    Patient Name: [name]      Medical Record Number: [id number]  Date of Birth: 22-Feb-1925 Sex: Male         Room/Bed: 4W10C/4W10C-01 Payor Info: Payor: MEDICARE / Plan: MEDICARE PART A AND B / Product Type: *No Product type* /    Admitting Diagnosis: debility  Admit Date/Time:  12/15/2015  4:35 PM Admission Comments: No comment available   Primary Diagnosis:  Debilitated Principal Problem: Debilitated  Patient Active Problem List   Diagnosis Date Noted  . Ischemia of finger   . Peripheral edema   . Hyperkalemia   . PAF (paroxysmal atrial fibrillation) ([place])   . History of GI bleed   . E. coli UTI   . Prediabetes   . Dysphagia   . Acute blood loss anemia   . Leukocytosis   . Chronic combined systolic and diastolic congestive heart failure ([place]) 12/15/2015  . Anemia of chronic kidney failure 12/15/2015  . Debilitated 12/15/2015  . Acute encephalopathy   . CKD (chronic kidney disease), stage III   . Bleeding gastrointestinal   . GI bleed 12/05/2015  . Acute lower GI bleeding   . Biventricular cardiac pacemaker in situ 10/11/2011  . Atrial fibrillation [facility])     Expected Discharge Date: Expected Discharge Date: 12/24/15  Team Members Present: Physician leading conference: Dr. Delice Lesch Social Worker Present: Lennart Pall, LCSW Nurse Present: Dorien Chihuahua, RN PT Present: Dwyane Dee, PT OT Present: Benay Pillow, OT SLP Present: Weston Anna, SLP PPS Coordinator present : Daiva Nakayama, RN, CRRN     Current Status/Progress Goal Weekly Team Focus  Medical   Debilitation and resolving encephalopathy secondary to GI bleed status post epi injection and cauterization/multi-medical  Follow labs, endurance, strength, mobility, optimize meds, follow labs  see above   Bowel/Bladder   cont x2 LBM: 12/16/15   remain cont x2   have been using a condom cath at night- try  timed toileting throughout night.    Swallow/Nutrition/ Hydration   Dys. 3 textures with thin nectar-thick liquids, intiated water protocol, Full supervision  Supervision with least restrictive diet  MBS tomorrow    ADL's   min A overall, fatigues easily  mod I overall, supervision for shower transfer  pt edu, dynamic standing balance, self care retraining, functional transfers/mobility   Mobility   supervision/min assist for all mobility, deconditioning and poor activity tolerance  mod I except supervision on stairs and in community  activity tolerance, LE strength, gait, transfers, balance   Communication             Safety/Cognition/ Behavioral Observations  Min A  Supervision  recall and complex problem solving    Pain   rates pain an 8/10 for his right posterior shoulder. Tylenol not effective. PRN oxy given   <3   assess and treat qshift and PRN    Skin   Right arm +2 edema and weeping, general bruising, MASD to buttocks, buttocks red but blanchable   remain free from further infection/breakdown while on rehab   turn q2 and monitor Right arm- No BP in arms, legs only.     Rehab Goals Patient on target to meet rehab goals: Yes *See Care Plan and progress notes for long and short-term goals.  Barriers to Discharge: ABLA, leukocytosis, hyperkalemia, no anticoag    Possible Resolutions to Barriers:  kayex, follow labs, resume anticoag in 2  weeks    Discharge Planning/Teaching Needs:  Home with wife who can provide intermittent assistance.  to be scheduled   Team Discussion:  Currently at min assist overall with mod ind goals. Mild STM deficits.  Currently on nectar - thick but planning MBS tomorrow.  Team alerting MD that arms still "weeping".    Revisions to Treatment Plan:  None      [name] 12/18/2015, 9:55 AM

## 2015-12-18 ENCOUNTER — Inpatient Hospital Stay (HOSPITAL_COMMUNITY): Payer: Medicare Other | Admitting: Speech Pathology

## 2015-12-18 ENCOUNTER — Inpatient Hospital Stay (HOSPITAL_COMMUNITY): Payer: Medicare Other | Admitting: Physical Therapy

## 2015-12-18 ENCOUNTER — Ambulatory Visit: Payer: Medicare Other | Admitting: Physician Assistant

## 2015-12-18 ENCOUNTER — Inpatient Hospital Stay (HOSPITAL_COMMUNITY): Payer: Medicare Other | Admitting: Occupational Therapy

## 2015-12-18 DIAGNOSIS — R600 Localized edema: Secondary | ICD-10-CM | POA: Insufficient documentation

## 2015-12-18 DIAGNOSIS — I999 Unspecified disorder of circulatory system: Secondary | ICD-10-CM

## 2015-12-18 DIAGNOSIS — I998 Other disorder of circulatory system: Secondary | ICD-10-CM | POA: Insufficient documentation

## 2015-12-18 DIAGNOSIS — R609 Edema, unspecified: Secondary | ICD-10-CM

## 2015-12-18 LAB — GLUCOSE, CAPILLARY
GLUCOSE-CAPILLARY: 57 mg/dL — AB (ref 65–99)
GLUCOSE-CAPILLARY: 127 mg/dL — AB (ref 65–99)
GLUCOSE-CAPILLARY: 154 mg/dL — AB (ref 65–99)
Glucose-Capillary: 51 mg/dL — ABNORMAL LOW (ref 65–99)
Glucose-Capillary: 66 mg/dL (ref 65–99)
Glucose-Capillary: 85 mg/dL (ref 65–99)
Glucose-Capillary: 140 mg/dL — ABNORMAL HIGH (ref 65–99)

## 2015-12-18 LAB — BASIC METABOLIC PANEL
Anion gap: 14 (ref 5–15)
BUN: 38 mg/dL — AB (ref 6–20)
CHLORIDE: 94 mmol/L — AB (ref 101–111)
CO2: 27 mmol/L (ref 22–32)
CREATININE: 1.17 mg/dL (ref 0.61–1.24)
Calcium: 7.6 mg/dL — ABNORMAL LOW (ref 8.9–10.3)
GFR calc Af Amer: >60 mL/min (ref >60)
GFR calc non Af Amer: 54 mL/min — ABNORMAL LOW (ref >60)
Glucose, Bld: 157 mg/dL — ABNORMAL HIGH (ref 65–99)
Potassium: 3.8 mmol/L (ref 3.5–5.1)
Sodium: 135 mmol/L (ref 135–145)

## 2015-12-18 MED ORDER — LORATADINE 10 MG PO TABS
10.0000 mg | ORAL_TABLET | Freq: Every day | ORAL | Status: DC
  Administered 2015-12-18 – 2015-12-24 (×7): 10 mg via ORAL
  Filled 2015-12-18 (×7): qty 1

## 2015-12-18 NOTE — Progress Notes (Signed)
Social Work Patient ID: [name], male   DOB: [name] 16, 1925, 82 y.o.   MRN: [id number]   Met with pt and wife following team conference.  Both aware and agreeable with targeted d/c date 5/11 with mod independent goals.  Wife to be gone a couple of days to take care of beach property.  No concerns.  Romie Tay, LCSW

## 2015-12-18 NOTE — Progress Notes (Signed)
Occupational Therapy Session Note  Patient Details  Name: ELIOT MALBURG MRN: [id number] Date of Birth: [birth year]  Today's Date: 12/18/2015 OT Individual Time:  -   0900-1000  (60 min)      Short Term Goals: Week 1:  OT Short Term Goal 1 (Week 1): STGs=LTGS secondary to short estimated LOS      Skilled Therapeutic Interventions/Progress Updates:    Upon entering the room, pt in wc eating breakfast.  He is with no c/o pain.  Pt wanted to sink bath rather than shower.  Pt shaved with electric razor with fair results from beard.  He positioned wc at sink.with SBA.  Pt bathed and dressed with set up and techniques for energy conservation.  Performed sit to stand x4 for balance and strenght.    Pt.left in room with all items in reach.    Therapy Documentation Precautions:  Precautions Precautions: Fall Restrictions Weight Bearing Restrictions: No General:   Vital Signs: Therapy Vitals Temp: 98.5 F (36.9 C) Temp Source: Oral Pulse Rate: 89 Resp: 18 BP: (!) 89/54 mmHg Patient Position (if appropriate): Lying Oxygen Therapy SpO2: 100 % O2 Device: Not Delivered Pain: Pain Assessment Pain Assessment: 0-10        See Function Navigator for Current Functional Status.   Therapy/Group: Individual Therapy  Lisa Roca 12/18/2015, 7:47 AM

## 2015-12-18 NOTE — Progress Notes (Signed)
Social Work  Social Work Assessment and Plan  Patient Details  Name: [name] MRN: [id number] Date of Birth: [birth year]  Today's Date: 12/18/2015  Problem List:  Patient Active Problem List   Diagnosis Date Noted  . Ischemia of finger   . Peripheral edema   . Hyperkalemia   . PAF (paroxysmal atrial fibrillation) ([place])   . History of GI bleed   . E. coli UTI   . Prediabetes   . Dysphagia   . Acute blood loss anemia   . Leukocytosis   . Chronic combined systolic and diastolic congestive heart failure ([place]) 12/15/2015  . Anemia of chronic kidney failure 12/15/2015  . Debilitated 12/15/2015  . Acute encephalopathy   . CKD (chronic kidney disease), stage III   . Bleeding gastrointestinal   . GI bleed 12/05/2015  . Acute lower GI bleeding   . Biventricular cardiac pacemaker in situ 10/11/2011  . Atrial fibrillation [facility])    Past Medical History:  Past Medical History  Diagnosis Date  . Atrial fibrillation (HCC)     Chronic  . Coronary artery disease 2004    Status post CABG with LIMA-LAD, SVG-CFX. Hx of anterior apical infarct.  . Renal insufficiency     Chronic  . Hypertension   . Hyperlipidemia   . Gout   . CHF (congestive heart failure) (HCC)     with chronic ischemic cardiomypathy. EF of 10-15%.  CHF due to systolic dysfunction. Clinically doing well.  [name] GERD (gastroesophageal reflux disease)   . Pacemaker     Medtronic biventricular pacemaker, serial number [id number]  . Type II diabetes mellitus ([place])     "been off my RX for ~ 2 yr" (03/20/2013)  . H/O hiatal hernia   . Arthritis     "right thumb" (03/20/2013)  . Depression   . Anxiety   . Bladder cancer ([place])     "low grade; grade 1; non-invasive" (03/20/2013)  . CKD (chronic kidney disease), stage III    Past Surgical History:  Past Surgical History  Procedure Laterality Date  . Transurethral resection of prostate  1990's; 2004    "twice; injected chemo 1st time; didn't do that 2nd time" (03/20/2013)  .  Nasal septum surgery    . US echocardiography  04-17-2009    Est EF 10-15%  . Cardiovascular stress test  04-17-2009    EF 29%  . Coronary artery bypass graft  02/19/2003    LIMA-LAD, SVG-CFX  . Carotid endarterectomy Right 1990's    "left side is still  100% blocked" (03/20/2013)  . Icd lead removal Left 03/01/2013    Procedure: ICD LEAD REMOVAL;  Surgeon: Evans Lance, MD;  Location: [facility];  Service: Cardiovascular;  Laterality: Left;  . Pacemaker lead removal Left 03/01/2013    Procedure: PACEMAKER LEAD REMOVAL;  Surgeon: Evans Lance, MD;  Location: [facility];  Service: Cardiovascular;  Laterality: Left;  . Pacemaker insertion N/A 03/01/2013    Procedure: INSERTION PACEMAKER LEAD;  Surgeon: Evans Lance, MD;  Location: [facility];  Service: Cardiovascular;  Laterality: N/A;  . Hemorrhoid surgery      "lanced several years ago" (03/20/2013)  . Tonsillectomy and adenoidectomy  1933  . Cataract extraction w/ intraocular lens  implant, bilateral Bilateral ~ 2000  . Bi-ventricular pacemaker insertion N/A 03/20/2013    Procedure: BI-VENTRICULAR PACEMAKER INSERTION (CRT-P);  Surgeon: Evans Lance, MD; Medtronic biventricular pacemaker, serial number [id number]; Laterality: Right  . Flexible sigmoidoscopy N/A 12/05/2015  Procedure: FLEXIBLE SIGMOIDOSCOPY;  Surgeon: [name], MD;  Location: [facility] ENDOSCOPY;  Service: Endoscopy;  Laterality: N/A;   Social History:  reports that he quit smoking about 55 years ago. His smoking use included Cigarettes. He has a 17 pack-year smoking history. He has never used smokeless tobacco. He reports that he drinks alcohol. He reports that he does not use illicit drugs.  Family / Support Systems Marital Status: Married How Long?: ~ 33 yrs (2nd marriage for both) Patient Roles: Spouse, Parent Spouse/Significant Other: wife, Tobie Poet @ (H) [phone] or (C) [phone] Children: pt has one son, Shanon Brow, living in [place].  Wife has adult children as well. Anticipated  Caregiver: Wife Ability/Limitations of Caregiver: Wife can assist.  Pt notes she does have "a problem with her shoulder" but feel she can meet whatever assist needs he has. Caregiver Availability: 24/7 Family Dynamics: Good support from wife and adult children.  Social History Preferred language: English Religion: Protestant Cultural Background: NA Education: college Read: Yes Write: Yes Employment Status: Retired Date Retired/Disabled/Unemployed: able to retire at 51 yrs Freight forwarder Issues: None Guardian/Conservator: None - per MD, pt is capable of making decisions on his own behalv.   Abuse/Neglect Physical Abuse: Denies Verbal Abuse: Denies Sexual Abuse: Denies Exploitation of patient/patient's resources: Denies Self-Neglect: Denies  Emotional Status Pt's affect, behavior adn adjustment status: Pt very pleasant and completes assessment interview without difficulty.  He denies any s/s of emotional distress.  Will monitor and refer for neuropsychology consult as indicated. Recent Psychosocial Issues: None Pyschiatric History: None Substance Abuse History: None  Patient / Family Perceptions, Expectations & Goals Pt/Family understanding of illness & functional limitations: Pt and wife with good understanding of his medical issues and current level of debility/ need for CIR. Premorbid pt/family roles/activities: Pt was independent PTA but had had recent falls.  still driving and making regular trips to their beach home. Anticipated changes in roles/activities/participation: Tx with Mod i goals so little change in roles anticipated. Pt/family expectations/goals: "I just hope this weeping in my arms will stop."  [employer]: None Premorbid Home Care/DME Agencies: None Transportation available at discharge: yes  Discharge Planning Living Arrangements: Spouse/significant other Support Systems: Spouse/significant other, Children,  Friends/neighbors Type of Residence: Private residence Insurance underwriter Resources: [employer], Multimedia programmer (specify) Psychologist, counselling) Financial Resources: [place] Referred: No Living Expenses: Own Money Management: Patient Does the patient have any problems obtaining your medications?: No Home Management: pt and wife Patient/Family Preliminary Plans: Pt to return home with wife as primary support Social Work Anticipated Follow Up Needs: HH/OP Expected length of stay: 7 days  Clinical Impression Very pleasant, elderly gentleman here with significant debility following GI bleed.  Cognitively intact and denies any emotional difficulties.  Hopeful he will make good progress and reach projected mod i goals.  Wife able to provide 24/7 assistance upon d/c.  Will follow for support and d/c planning needs.  Yadier Bramhall 12/18/2015, 3:58 PM

## 2015-12-18 NOTE — Progress Notes (Signed)
Physical Therapy Session Note  Patient Details  Name: [name] MRN: [id number] Date of Birth: 12/06/1924  Today's Date: 12/18/2015 PT Individual Time: 1300-1400 PT Individual Time Calculation (min): 60 min   Short Term Goals: Week 1:  PT Short Term Goal 1 (Week 1): =LTGs due to ELOS  Skilled Therapeutic Interventions/Progress Updates:    Pt received resting in w/c, finishing lunch and agreeable to therapy session.  Pt able to recall speech therapy session from earlier in the day and able to describe to this therapist his swallowing exercises.  Pt propelled w/c x200' to therapy gym with supervision and 2 short rest breaks.  LE strengthening at stairs with 2x10 toe taps on 3" step progressing to 2x5 step ups on 3" step with R and with L.  Pt required seated rest break between each set.  Pt transitioned to sitting on edge of therapy mat for UE therex focus on UE and core strength/endurance.  Pt performed x10 reps of chest press, shoulder press, bicep curls, and diagonals to R and L  2# of resistance and required rest break between each set.  Gait training x100' with supervision for safety and encouragement.  Pt returned to room at end of session and positioned in bed with call bell in reach and needs met.    Therapy Documentation Precautions:  Precautions Precautions: Fall Restrictions Weight Bearing Restrictions: No   See Function Navigator for Current Functional Status.   Therapy/Group: Individual Therapy  Sabastion Hrdlicka E Penven-Crew 12/18/2015, 1:30 PM

## 2015-12-18 NOTE — Care Management Note (Signed)
Inpatient [facility] Individual Statement of Services  Patient Name:  [name]  Date:  12/18/2015  Welcome to the [facility].  Our goal is to provide you with an individualized program based on your diagnosis and situation, designed to meet your specific needs.  With this comprehensive rehabilitation program, you will be expected to participate in at least 3 hours of rehabilitation therapies Monday-Friday, with modified therapy programming on the weekends.  Your rehabilitation program will include the following services:  Physical Therapy (PT), Occupational Therapy (OT), 24 hour per day rehabilitation nursing, Therapeutic Recreaction (TR), Case Management (Social Worker), Rehabilitation Medicine, Nutrition Services and Pharmacy Services  Weekly team conferences will be held on Wednesdays to discuss your progress.  Your Social Worker will talk with you frequently to get your input and to update you on team discussions.  Team conferences with you and your family in attendance may also be held.  Expected length of stay: 10 days  Overall anticipated outcome: modified independent  Depending on your progress and recovery, your program may change. Your Social Worker will coordinate services and will keep you informed of any changes. Your Social Worker's name and contact numbers are listed  below.  The following services may also be recommended but are not provided by the [facility] will be made to provide these services after discharge if needed.  Arrangements include referral to agencies that provide these services.  Your insurance has been verified to be:  Medicare and [name] Your primary doctor is:  Leanna Battles  Pertinent information will be shared with your doctor and your insurance company.  Social Worker:  [place],  [place] or ([phone]   Information discussed with and copy given to patient by: Lennart Pall, 12/18/2015, 3:45 PM

## 2015-12-18 NOTE — Significant Event (Signed)
Hypoglycemic Event  CBG: 66  Treatment: 15 GM carbohydrate snack  Symptoms: None  Follow-up CBG: Time:0705 CBG Result:57  Possible Reasons for Event: Unknown  Comments/MD notified:Dr. Jonette Pesa, Marlane Hatcher

## 2015-12-18 NOTE — Progress Notes (Signed)
[facility]IC[facility] & [facility]ABILITATION     PROGRESS NOTE  Subjective/Complaints:  Patient sitting up in bed this morning. He notes low blood sugars this morning. He also notes that he did not receive any dressing for his right upper extremity.  ROS: Denies CP, SOB, N/V/D.   Objective: Vital Signs: Blood pressure 89/54, pulse 89, temperature 98.5 F (36.9 C), temperature source Oral, resp. rate 18, height 5\' 7"  (1.702 m), weight 49.8 kg (109 lb 12.6 oz), SpO2 100 %. Dg Chest 1 View  12/17/2015  CLINICAL DATA:  Patient with history of leukocytosis. EXAM: CHEST 1 VIEW COMPARISON:  Chest radiograph 12/11/2015. FINDINGS: Stable cardiac and mediastinal contours status post median sternotomy. Multi lead pacer apparatus overlies right hemi thorax, leads are stable in position. No consolidative pulmonary opacities. No pleural effusion or pneumothorax. Old left mid clavicle fracture. IMPRESSION: No acute cardiopulmonary process. Electronically Signed   By: Lovey Newcomer M.D.   On: 12/17/2015 11:43   Dg Swallowing Func-speech Pathology  12/17/2015  Objective Swallowing Evaluation: Type of Study: MBS-Modified Barium Swallow Study Patient Details Name: JOHNTE GILDAY MRN: [id number] Date of Birth: 1925/04/14 Today's Date: 12/17/2015 Time: SLP Start Time (ACUTE ONLY): 1033-SLP Stop Time (ACUTE ONLY): 1054 SLP Time Calculation (min) (ACUTE ONLY): 21 min Past Medical History: Past Medical History Diagnosis Date . Atrial fibrillation (HCC)    Chronic . Coronary artery disease 2004   Status post CABG with LIMA-LAD, SVG-CFX. Hx of anterior apical infarct. . Renal insufficiency    Chronic . Hypertension  . Hyperlipidemia  . Gout  . CHF (congestive heart failure) (HCC)    with chronic ischemic cardiomypathy. EF of 10-15%.  CHF due to systolic dysfunction. Clinically doing well. [name] GERD (gastroesophageal reflux disease)  . Pacemaker    Medtronic biventricular pacemaker, serial number [id number] . Type II diabetes mellitus  ([place])    "been off my RX for ~ 2 yr" (03/20/2013) . H/O hiatal hernia  . Arthritis    "right thumb" (03/20/2013) . Depression  . Anxiety  . Bladder cancer ([place])    "low grade; grade 1; non-invasive" (03/20/2013) . CKD (chronic kidney disease), stage III  Past Surgical History: Past Surgical History Procedure Laterality Date . Transurethral resection of prostate  1990's; 2004   "twice; injected chemo 1st time; didn't do that 2nd time" (03/20/2013) . Nasal septum surgery   . US echocardiography  04-17-2009   Est EF 10-15% . Cardiovascular stress test  04-17-2009   EF 29% . Coronary artery bypass graft  02/19/2003   LIMA-LAD, SVG-CFX . Carotid endarterectomy Right 1990's   "left side is still  100% blocked" (03/20/2013) . Icd lead removal Left 03/01/2013   Procedure: ICD LEAD REMOVAL;  Surgeon: Evans Lance, MD;  Location: [facility];  Service: Cardiovascular;  Laterality: Left; . Pacemaker lead removal Left 03/01/2013   Procedure: PACEMAKER LEAD REMOVAL;  Surgeon: Evans Lance, MD;  Location: [facility];  Service: Cardiovascular;  Laterality: Left; . Pacemaker insertion N/A 03/01/2013   Procedure: INSERTION PACEMAKER LEAD;  Surgeon: Evans Lance, MD;  Location: [facility];  Service: Cardiovascular;  Laterality: N/A; . Hemorrhoid surgery     "lanced several years ago" (03/20/2013) . Tonsillectomy and adenoidectomy  1933 . Cataract extraction w/ intraocular lens  implant, bilateral Bilateral ~ 2000 . Bi-ventricular pacemaker insertion N/A 03/20/2013   Procedure: BI-VENTRICULAR PACEMAKER INSERTION (CRT-P);  Surgeon: Evans Lance, MD; Medtronic biventricular pacemaker, serial number [id number]; Laterality: Right . Flexible sigmoidoscopy N/A 12/05/2015  Procedure: FLEXIBLE SIGMOIDOSCOPY;  Surgeon: [name], MD;  Location: [facility] ENDOSCOPY;  Service: Endoscopy;  Laterality: N/A; HPI: 82 y.o. male with medical history significant for atrial fibrillation on Coumadin, CAD status post CABG, chronic kidney disease stage III, chronic systolic CHF,  depression and anxiety, just discharged to inpatient rehabilitation 12/11/2015 after his hospitalization for GI bleed. Patient transferred from rehabilitation to the telemetry floor 12/12/2015 after he was found to be unresponsive. Subjective: pt alert, confused Assessment / Plan / Recommendation CHL IP CLINICAL IMPRESSIONS 12/17/2015 Therapy Diagnosis Moderate pharyngeal phase dysphagia Clinical Impression Pt presents with moderate-severe oropharyngeal dysphagia characterized by silent aspiration across all consistencies with diffuse weakness of musculature.  Pt demonstrated silent aspiration with thin, nectar and puree. Aspiration occured secondary to deep laryngeal penetration with thin during the swallow and occurred with other consistencies following the swallow secondary to vallecular and pyriform residue. Increased viscosity resulted in increased residue. Dry swallow was minimally effective to decrease residue. Poor BOT retraction, hyo-laryngeal excursion and epiglottic inversion. Suspect this is not an acute issue given general decline and debility, however this certainly increases pt's risk for poor nutrition and respiratory compromise. Compensatory strategies of chin tuck, effortful swallow and dry swallow were ineffective to eliminate aspiration or clear residue. Pt has difficulty with mastication of solids and has requested liquids only. Given all of the above information, recommend initiation of Full Liquid diet with strict aspiration precautions. OOB with all  meals, small sips/bites (no straws), intermittent cued throat clear and dry swallow, and meds whole with puree. Discussed POC with pt, RN and PA. All partied verbalized understanding.  Impact on safety and function Severe aspiration risk   CHL IP TREATMENT RECOMMENDATION 12/13/2015 Treatment Recommendations Therapy as outlined in treatment plan below   Prognosis 12/17/2015 Prognosis for Safe Diet Advancement Fair Barriers to Reach Goals --  Barriers/Prognosis Comment -- CHL IP DIET RECOMMENDATION 12/17/2015 SLP Diet Recommendations Other (Comment) Liquid Administration via Cup Medication Administration Whole meds with puree Compensations Slow rate;Small sips/bites;Multiple dry swallows after each bite/sip;Clear throat intermittently Postural Changes --   CHL IP OTHER RECOMMENDATIONS 12/17/2015 Recommended Consults -- Oral Care Recommendations Oral care BID Other Recommendations --   CHL IP FOLLOW UP RECOMMENDATIONS 12/14/2015 Follow up Recommendations Inpatient Rehab   CHL IP FREQUENCY AND DURATION 12/13/2015 Speech Therapy Frequency (ACUTE ONLY) min 2x/week Treatment Duration 2 weeks      CHL IP ORAL PHASE 12/17/2015 Oral Phase Impaired Oral - Pudding Teaspoon -- Oral - Pudding Cup -- Oral - Honey Teaspoon -- Oral - Honey Cup -- Oral - Nectar Teaspoon -- Oral - Nectar Cup -- Oral - Nectar Straw -- Oral - Thin Teaspoon -- Oral - Thin Cup -- Oral - Thin Straw -- Oral - Puree -- Oral - Mech Soft -- Oral - Regular -- Oral - Multi-Consistency -- Oral - Pill -- Oral Phase - Comment increased time required for A-P propulsion with all consistencies  CHL IP PHARYNGEAL PHASE 12/17/2015 Pharyngeal Phase Impaired Pharyngeal- Pudding Teaspoon -- Pharyngeal -- Pharyngeal- Pudding Cup -- Pharyngeal -- Pharyngeal- Honey Teaspoon -- Pharyngeal -- Pharyngeal- Honey Cup -- Pharyngeal -- Pharyngeal- Nectar Teaspoon -- Pharyngeal -- Pharyngeal- Nectar Cup -- Pharyngeal -- Pharyngeal- Nectar Straw -- Pharyngeal -- Pharyngeal- Thin Teaspoon -- Pharyngeal -- Pharyngeal- Thin Cup -- Pharyngeal -- Pharyngeal- Thin Straw -- Pharyngeal -- Pharyngeal- Puree -- Pharyngeal -- Pharyngeal- Mechanical Soft -- Pharyngeal -- Pharyngeal- Regular -- Pharyngeal -- Pharyngeal- Multi-consistency -- Pharyngeal -- Pharyngeal- Pill -- Pharyngeal -- Pharyngeal Comment Pt  demonstrated silent aspiration with thin, nectar and puree. Aspiration occured secondary to deep laryngeal penetration with thin during  the swallow and occurred with other consistencies following the swallow secondary to vallecular and pyriform residue. Increased viscosity resulted in increased residue. Dry swallow was minimally effective to decrease residue. Poor BOT retraction, hyo-laryngeal excursion and epiglottic inversion.  CHL IP CERVICAL ESOPHAGEAL PHASE 12/17/2015 Cervical Esophageal Phase WFL Pudding Teaspoon -- Pudding Cup -- Honey Teaspoon -- Honey Cup -- Nectar Teaspoon -- Nectar Cup -- Nectar Straw -- Thin Teaspoon -- Thin Cup -- Thin Straw -- Puree -- Mechanical Soft -- Regular -- Multi-consistency -- Pill -- Cervical Esophageal Comment -- No flowsheet data found. Vinetta Bergamo MA, CCC-SLP 12/17/2015, 3:50 PM               Recent Labs  12/16/15 0519 12/17/15 0512  WBC 11.9* 13.9*  HGB 10.2* 10.2*  HCT 31.9* 32.3*  PLT 210 182    Recent Labs  12/16/15 0519 12/17/15 0512  NA 136 138  K 5.9* 5.4*  CL 106 102  GLUCOSE 106* 145*  BUN 37* 41*  CREATININE 1.55* 1.52*  CALCIUM 7.8* 8.0*   CBG (last 3)   Recent Labs  12/18/15 0702 12/18/15 0705 12/18/15 0740  GLUCAP 51* 57* 85    Wt Readings from Last 3 Encounters:  12/18/15 49.8 kg (109 lb 12.6 oz)  12/13/15 50 kg (110 lb 3.7 oz)  12/11/15 52.935 kg (116 lb 11.2 oz)    Physical Exam:  BP 89/54 mmHg  Pulse 89  Temp(Src) 98.5 F (36.9 C) (Oral)  Resp 18  Ht 5\' 7"  (1.702 m)  Wt 49.8 kg (109 lb 12.6 oz)  BMI 17.19 kg/m2  SpO2 100% Gen: NAD. Vital signs reviewed.  HENT: Bruising to left orbital area and side of face  Eyes: EOM are normal.  Cardiovascular: Cardiac rate controlled  Respiratory: Effort normal and breath sounds normal. No respiratory distress.  GI: Soft. Bowel sounds are normal. He exhibits no distension.  Neurological: He is alert and oriented.  Follows commands.  B/l UE grossly 4+/5 prox to distal.  B/l LE: 4-/5 HF, KE 4-, ADF/PF 4/5.   Skin: Skin is warm and dry.  Numerous bruises on his arms and legs.   Weeping from right  proximal forearm  Bluish discoloration of distal upper extremities, with cooler temperatures  Psychiatric: He has a normal mood and affect. His behavior is normal.   Assessment/Plan: 1. Functional deficits secondary to GI bleed status post epi injection and cauterization/multi-medical which require 3+ hours per day of interdisciplinary therapy in a comprehensive inpatient rehab setting. Physiatrist is providing close team supervision and 24 hour management of active medical problems listed below. Physiatrist and rehab team continue to assess barriers to discharge/monitor patient progress toward functional and medical goals.  Function:  Bathing Bathing position Bathing activity did not occur: Refused Position: Production manager parts bathed by patient: Right arm, Left arm, Chest, Abdomen, Front perineal area, Buttocks, Right upper leg, Left upper leg Body parts bathed by helper: Right lower leg, Left lower leg, Back  Bathing assist Assist Level: Touching or steadying assistance(Pt > 75%)      Upper Body Dressing/Undressing Upper body dressing   What is the patient wearing?: Pull over shirt/dress     Pull over shirt/dress - Perfomed by patient: Thread/unthread right sleeve, Thread/unthread left sleeve, Put head through opening, Pull shirt over trunk          Upper  body assist Assist Level: Set up      Lower Body Dressing/Undressing Lower body dressing   What is the patient wearing?: Pants     Pants- Performed by patient: Pull pants up/down Pants- Performed by helper: Thread/unthread right pants leg, Thread/unthread left pants leg   Non-skid slipper socks- Performed by helper: Don/doff right sock, Don/doff left sock                  Lower body assist Assist for lower body dressing: Touching or steadying assistance (Pt > 75%)      Toileting Toileting   Toileting steps completed by patient: Adjust clothing prior to toileting, Performs perineal hygiene, Adjust  clothing after toileting      Toileting assist Assist level: Touching or steadying assistance (Pt.75%)   Transfers Chair/bed transfer   Chair/bed transfer method: Ambulatory Chair/bed transfer assist level: Supervision or verbal cues Chair/bed transfer assistive device: Armrests, Medical sales representative     Max distance: 75 Assist level: Supervision or verbal cues   Wheelchair   Type: Manual Max wheelchair distance: 157 Assist Level: Supervision or verbal cues  Cognition Comprehension Comprehension assist level: Follows basic conversation/direction with extra time/assistive device  Expression Expression assist level: Expresses complex ideas: With extra time/assistive device  Social Interaction Social Interaction assist level: Interacts appropriately with others with medication or extra time (anti-anxiety, antidepressant).  Problem Solving Problem solving assist level: Solves complex 90% of the time/cues < 10% of the time  Memory Memory assist level: Recognizes or recalls 75 - 89% of the time/requires cueing 10 - 24% of the time    Medical Problem List and Plan: 1. Debilitation and resolving encephalopathy secondary to GI bleed status post epi injection and cauterization/multi-medical  -Continue CIR 2. DVT Prophylaxis/Anticoagulation: Superficial thrombus right basilic vein and left cephalic vein.   No anticoagulation at this time due to GI bleed.   Will consider follow-up Doppler study in future. 3. Pain Management: Low-dose oxycodone 5 mg every 4 hours as needed. Monitor mental status 4. Seizure prophylaxis. Depakote 750 mg every 12 hours. 5. Neuropsych: This patient is capable of making decisions on his own behalf. 6. Skin/Wound Care: Routine skin checks, monitoring of scattered abrasions 7. Fluids/Electrolytes/Nutrition: Routine I&O's   Hyperkalemia: Resolved, 3.8 on 5/5 8. CAD status post CABG. No chest pain or shortness of breath. 9. History of atrial  fibrillation.   Coumadin remains on hold due to GI bleed consider resuming in 2 weeks if no further bleeding versus aspirin (?~5/10).   Continue Lanoxin 0.0625 mg daily 10. Systolic diastolic congestive heart failure. Monitor for any signs of fluid overload.  Weight stable at around 49 KG's 11. Escherichia coli urinary tract infection. Last day of Rocephin 12/16/2015  12. Gout. Zyloprim 300 mg daily 13. Hyperlipidemia. Pravachol 14. Hypotension:  Likely exacerbated by third spacing, protein added on 5/4  Will encourage fluid intake 15. Prediabetes  Will cont to monitor 16. Dysphagia  Will advance diet as tolerated and transition to carb controlled diet 17. Acute blood loss anemia  Hb 10.2 on 5/4  Will cont to monitior 18. Leukocytosis  WBCs 13.9 on 5/4  Ucx no growth from 5/2  Repeat chest x-ray 5/4 reviewed, relatively unremarkable  Will cont to monitor 19. CKD  Cr  1.17 on 5/5  Will cont to monitor 20. Peripheral edema  Dependent edema bilateral upper extremities, right greater than left  Encourage patient to elevate upper extremities  Absorption dressing ordered  Patient  blood pressure unable to tolerate diuresis at this time 21. Cyanotic distal extremities,?Raynaud's    vascular consultation, appreciate consult  Vascular arterial ultrasound ordered, which were relatively unremarkable  Will provide heat packs to hands and observed for improvement  Patient blood pressure unable to tolerate CCB this time  LOS (Days) 3 A FACE TO FACE EVALUATION WAS PERFORMED  Ankit Lorie Phenix 12/18/2015 9:29 AM

## 2015-12-18 NOTE — Progress Notes (Signed)
Speech Language Pathology Daily Session Note  Patient Details  Name: [name] MRN: [id number] Date of Birth: 1925/05/11  Today's Date: 12/18/2015 SLP Individual Time: 1100-1200 SLP Individual Time Calculation (min): 60 min  Short Term Goals: Week 1: SLP Short Term Goal 1 (Week 1): Patient will demonstrate complex problem solving for familiar tasks with Mod I.  SLP Short Term Goal 2 (Week 1): Patient will recall new, daily information with use of external aids with Mod I.  SLP Short Term Goal 3 (Week 1): Patient will consume current diet with minimal overt s/s of aspiration with Mod I for use of swallowing strategies.   Skilled Therapeutic Interventions: Pt seen for dysphagia and cognitive-linguistic therapy. Pt continues to follow precaution of throat clear and swallow every couple of sips independently. Intermittent wet vocal quality noted on occasion. Pt able to demonstrate the [place] in sets of 5 alternating with a sip. Underscored importance of continuing completion of exercises even outside of therapy times. Focus on working memory and reasoning. Pt completed complex novel game with min A after training session. Pt id demonstrating repetition of previously discussed information without awareness of repetition.    Function:  Eating Eating   Modified Consistency Diet: Yes Eating Assist Level: Supervision or verbal cues;More than reasonable amount of time           Cognition Comprehension Comprehension assist level: Follows basic conversation/direction with extra time/assistive device  Expression   Expression assist level: Expresses basic needs/ideas: With no assist  Social Interaction Social Interaction assist level: Interacts appropriately with others with medication or extra time (anti-anxiety, antidepressant).  Problem Solving Problem solving assist level: Solves basic problems with no assist  Memory Memory assist level: Recognizes or recalls 75 - 89% of the time/requires  cueing 10 - 24% of the time    Pain Pain Assessment Pain Assessment: No/denies pain  Therapy/Group: Individual Therapy  Vinetta Bergamo MA, CCC-SLP 12/18/2015, 12:15 PM

## 2015-12-18 NOTE — Progress Notes (Signed)
Speech Language Pathology Daily Session Note  Patient Details  Name: [name] MRN: [id number] Date of Birth: [birth year]  Today's Date: 12/18/2015 SLP Individual Time: 0830-0900 SLP Individual Time Calculation (min): 30 min  Short Term Goals: Week 1: SLP Short Term Goal 1 (Week 1): Patient will demonstrate complex problem solving for familiar tasks with Mod I.  SLP Short Term Goal 2 (Week 1): Patient will recall new, daily information with use of external aids with Mod I.  SLP Short Term Goal 3 (Week 1): Patient will consume current diet with minimal overt s/s of aspiration with Mod I for use of swallowing strategies.   Skilled Therapeutic Interventions: Pt seen for dysphagia therapy. Pt observing throat clear and swallow every couple of sips independently. Intermittent wet vocal quality noted. Pt able to demonstrate the Masako in sets of 2 alternating with a sip. Underscored importance of OOB positioning during meals with pt and RN. Both parties verbalized understanding.   Function:  Eating Eating   Modified Consistency Diet: Yes Eating Assist Level: Supervision or verbal cues;More than reasonable amount of time           Cognition Comprehension Comprehension assist level: Follows basic conversation/direction with extra time/assistive device  Expression   Expression assist level: Expresses basic needs/ideas: With no assist  Social Interaction Social Interaction assist level: Interacts appropriately with others with medication or extra time (anti-anxiety, antidepressant).  Problem Solving Problem solving assist level: Solves basic problems with no assist  Memory Memory assist level: Recognizes or recalls 75 - 89% of the time/requires cueing 10 - 24% of the time    Pain Pain Assessment Pain Assessment: No/denies pain  Therapy/Group: Individual Therapy  Vinetta Bergamo MA ,CCC-SLP 12/18/2015, 9:57 AM

## 2015-12-18 NOTE — Progress Notes (Signed)
Hypoglycemic Event  CBG: 57  Treatment: 15 GM carbohydrate snack  Symptoms: None  Follow-up CBG: Time:0740 CBG Result:85  Possible Reasons for Event: Unknown  Comments/MD notified:Dr. Jonette Pesa, Marlane Hatcher

## 2015-12-19 ENCOUNTER — Inpatient Hospital Stay (HOSPITAL_COMMUNITY): Payer: Medicare Other | Admitting: Occupational Therapy

## 2015-12-19 ENCOUNTER — Inpatient Hospital Stay (HOSPITAL_COMMUNITY): Payer: Medicare Other | Admitting: Speech Pathology

## 2015-12-19 ENCOUNTER — Inpatient Hospital Stay (HOSPITAL_COMMUNITY): Payer: Medicare Other | Admitting: Physical Therapy

## 2015-12-19 LAB — BASIC METABOLIC PANEL
Anion gap: 14 (ref 5–15)
BUN: 42 mg/dL — AB (ref 6–20)
CALCIUM: 7.6 mg/dL — AB (ref 8.9–10.3)
CO2: 24 mmol/L (ref 22–32)
CREATININE: 1.09 mg/dL (ref 0.61–1.24)
Chloride: 97 mmol/L — ABNORMAL LOW (ref 101–111)
GFR calc Af Amer: >60 mL/min (ref >60)
GFR, EST NON AFRICAN AMERICAN: 59 mL/min — AB (ref >60)
GLUCOSE: 77 mg/dL (ref 65–99)
Potassium: 4.3 mmol/L (ref 3.5–5.1)
SODIUM: 135 mmol/L (ref 135–145)

## 2015-12-19 LAB — CBC WITH DIFFERENTIAL/PLATELET
BASOS ABS: 0 10*3/uL (ref 0.0–0.1)
Basophils Relative: 0 %
EOS ABS: 0.3 10*3/uL (ref 0.0–0.7)
EOS PCT: 2 %
HCT: 29.7 % — ABNORMAL LOW (ref 39.0–52.0)
Hemoglobin: 9.8 g/dL — ABNORMAL LOW (ref 13.0–17.0)
LYMPHS PCT: 8 %
Lymphs Abs: 1 10*3/uL (ref 0.7–4.0)
MCH: 32.3 pg (ref 26.0–34.0)
MCHC: 33 g/dL (ref 30.0–36.0)
MCV: 98 fL (ref 78.0–100.0)
MONO ABS: 1.2 10*3/uL — AB (ref 0.1–1.0)
Monocytes Relative: 10 %
Neutro Abs: 9.6 10*3/uL — ABNORMAL HIGH (ref 1.7–7.7)
Neutrophils Relative %: 80 %
PLATELETS: 125 10*3/uL — AB (ref 150–400)
RBC: 3.03 MIL/uL — AB (ref 4.22–5.81)
RDW: 20 % — AB (ref 11.5–15.5)
WBC: 12.2 10*3/uL — AB (ref 4.0–10.5)

## 2015-12-19 LAB — GLUCOSE, CAPILLARY
GLUCOSE-CAPILLARY: 83 mg/dL (ref 65–99)
GLUCOSE-CAPILLARY: 127 mg/dL — AB (ref 65–99)
GLUCOSE-CAPILLARY: 190 mg/dL — AB (ref 65–99)
Glucose-Capillary: 188 mg/dL — ABNORMAL HIGH (ref 65–99)

## 2015-12-19 MED ORDER — FUROSEMIDE 10 MG/ML IJ SOLN
40.0000 mg | Freq: Once | INTRAMUSCULAR | Status: AC
  Administered 2015-12-19: 40 mg via INTRAVENOUS
  Filled 2015-12-19: qty 4

## 2015-12-19 MED ORDER — DIVALPROEX SODIUM 125 MG PO CSDR
750.0000 mg | DELAYED_RELEASE_CAPSULE | Freq: Two times a day (BID) | ORAL | Status: DC
  Administered 2015-12-19 – 2015-12-24 (×10): 750 mg via ORAL
  Filled 2015-12-19 (×10): qty 6

## 2015-12-19 NOTE — Progress Notes (Signed)
[place] PHYSICAL MEDICINE & REHABILITATION     PROGRESS NOTE  Subjective/Complaints:  Pt laying in bed this AM.  He slept well overnight.  He continues to have edema in his RUE.  ROS: +RUE weeping. Denies CP, SOB, N/V/D.   Objective: Vital Signs: Blood pressure 99/48, pulse 90, temperature 98.6 F (37 C), temperature source Oral, resp. rate 17, height 5\' 7"  (1.702 m), weight 49.4 kg (108 lb 14.5 oz), SpO2 96 %. Dg Chest 1 View  12/17/2015  CLINICAL DATA:  Patient with history of leukocytosis. EXAM: CHEST 1 VIEW COMPARISON:  Chest radiograph 12/11/2015. FINDINGS: Stable cardiac and mediastinal contours status post median sternotomy. Multi lead pacer apparatus overlies right hemi thorax, leads are stable in position. No consolidative pulmonary opacities. No pleural effusion or pneumothorax. Old left mid clavicle fracture. IMPRESSION: No acute cardiopulmonary process. Electronically Signed   By: Lovey Newcomer M.D.   On: 12/17/2015 11:43    Recent Labs  12/17/15 0512 12/19/15 0543  WBC 13.9* 12.2*  HGB 10.2* 9.8*  HCT 32.3* 29.7*  PLT 182 125*    Recent Labs  12/18/15 0813 12/19/15 0543  NA 135 135  K 3.8 4.3  CL 94* 97*  GLUCOSE 157* 77  BUN 38* 42*  CREATININE 1.17 1.09  CALCIUM 7.6* 7.6*   CBG (last 3)   Recent Labs  12/18/15 1734 12/18/15 2034 12/19/15 0636  GLUCAP 154* 140* 83    Wt Readings from Last 3 Encounters:  12/19/15 49.4 kg (108 lb 14.5 oz)  12/13/15 50 kg (110 lb 3.7 oz)  12/11/15 52.935 kg (116 lb 11.2 oz)    Physical Exam:  BP 99/48 mmHg  Pulse 90  Temp(Src) 98.6 F (37 C) (Oral)  Resp 17  Ht 5\' 7"  (1.702 m)  Wt 49.4 kg (108 lb 14.5 oz)  BMI 17.05 kg/m2  SpO2 96% Gen: NAD. Vital signs reviewed.  HENT: Bruising to left orbital area and side of face (improving) Eyes: EOM are normal.  Cardiovascular: Cardiac rate controlled  Respiratory: Effort normal and breath sounds normal. No respiratory distress.  GI: Soft. Bowel sounds are normal.  He exhibits no distension.  MSK: +Edema. No tenderness.  Neurological: He is alert and oriented.  Follows commands.  B/l UE grossly 4+/5 prox to distal.  B/l LE: 4-/5 HF, KE 4-, ADF/PF 4/5.   Skin: Skin is warm and dry.  Numerous bruises on his arms and legs.   Weeping from right proximal forearm  Finger tips improved, no longer cyonotic Psychiatric: He has a normal mood and affect. His behavior is normal.   Assessment/Plan: 1. Functional deficits secondary to GI bleed status post epi injection and cauterization/multi-medical which require 3+ hours per day of interdisciplinary therapy in a comprehensive inpatient rehab setting. Physiatrist is providing close team supervision and 24 hour management of active medical problems listed below. Physiatrist and rehab team continue to assess barriers to discharge/monitor patient progress toward functional and medical goals.  Function:  Bathing Bathing position Bathing activity did not occur: Refused Position: Wheelchair/chair at sink  Bathing parts Body parts bathed by patient: Right arm, Left arm, Chest, Abdomen, Right upper leg, Left upper leg, Front perineal area, Buttocks Body parts bathed by helper: Right lower leg, Left lower leg, Back  Bathing assist Assist Level: Touching or steadying assistance(Pt > 75%)      Upper Body Dressing/Undressing Upper body dressing   What is the patient wearing?: Pull over shirt/dress     Pull over shirt/dress - Perfomed  by patient: Thread/unthread right sleeve, Thread/unthread left sleeve, Put head through opening, Pull shirt over trunk Pull over shirt/dress - Perfomed by helper: Pull shirt over trunk        Upper body assist Assist Level: Set up   Set up : To obtain clothing/put away  Lower Body Dressing/Undressing Lower body dressing   What is the patient wearing?: Pants     Pants- Performed by patient: Thread/unthread right pants leg, Thread/unthread left pants leg, Pull pants  up/down Pants- Performed by helper: Thread/unthread right pants leg, Thread/unthread left pants leg   Non-skid slipper socks- Performed by helper: Don/doff right sock, Don/doff left sock                  Lower body assist Assist for lower body dressing: Touching or steadying assistance (Pt > 75%)      Toileting Toileting   Toileting steps completed by patient: Adjust clothing prior to toileting, Performs perineal hygiene, Adjust clothing after toileting   Toileting Assistive Devices: Grab bar or rail  Toileting assist Assist level: Touching or steadying assistance (Pt.75%)   Transfers Chair/bed transfer   Chair/bed transfer method: Stand pivot, Ambulatory Chair/bed transfer assist level: Supervision or verbal cues Chair/bed transfer assistive device: Armrests, Medical sales representative     Max distance: 100 Assist level: Supervision or verbal cues   Wheelchair   Type: Manual Max wheelchair distance: 157 Assist Level: Supervision or verbal cues  Cognition Comprehension Comprehension assist level: Follows basic conversation/direction with extra time/assistive device  Expression Expression assist level: Expresses basic needs/ideas: With no assist  Social Interaction Social Interaction assist level: Interacts appropriately 90% of the time - Needs monitoring or encouragement for participation or interaction.  Problem Solving Problem solving assist level: Solves basic 90% of the time/requires cueing < 10% of the time  Memory Memory assist level: Recognizes or recalls 50 - 74% of the time/requires cueing 25 - 49% of the time    Medical Problem List and Plan: 1. Debilitation and resolving encephalopathy secondary to GI bleed status post epi injection and cauterization/multi-medical  -Continue CIR 2. DVT Prophylaxis/Anticoagulation: Superficial thrombus right basilic vein and left cephalic vein.   No anticoagulation at this time due to GI bleed.   Will consider  follow-up Doppler study in future. 3. Pain Management: Low-dose oxycodone 5 mg every 4 hours as needed. Monitor mental status 4. Seizure prophylaxis. Depakote 750 mg every 12 hours. 5. Neuropsych: This patient is capable of making decisions on his own behalf. 6. Skin/Wound Care: Routine skin checks, monitoring of scattered abrasions 7. Fluids/Electrolytes/Nutrition: Routine I&O's   Hyperkalemia: Resolved, 4.3 on 5/6 8. CAD status post CABG. No chest pain or shortness of breath. 9. History of atrial fibrillation.   Coumadin remains on hold due to GI bleed consider resuming in 2 weeks if no further bleeding versus aspirin (?~5/10).   Continue Lanoxin 0.0625 mg daily 10. Systolic diastolic congestive heart failure. Monitor for any signs of fluid overload.  Weight stable at around 49 KG's 11. Escherichia coli urinary tract infection. Last day of Rocephin 12/16/2015  12. Gout. Zyloprim 300 mg daily 13. Hyperlipidemia. Pravachol 14. Hypotension:  Likely exacerbated by third spacing, protein added on 5/4  Will encourage fluid intake 15. Prediabetes  Will cont to monitor 16. Dysphagia  Will advance diet as tolerated and transition to carb controlled diet 17. Acute blood loss anemia  Hb 10.2 on 5/4 (labs from 5/6, likely dilutional)  Will cont to monitior 18.  Leukocytosis  WBCs 13.9 on 5/4 (labs from 5/6, likely dilutional)  Ucx no growth from 5/2  Repeat chest x-ray 5/4 reviewed, relatively unremarkable  Will cont to monitor 19. CKD  Cr  1.09 on 5/6  Will cont to monitor 20. Peripheral edema  Dependent edema bilateral upper extremities, right greater than left  Encourage patient to elevate upper extremities  Absorption dressing ordered, encouraged nursing not to use constrictive dressing due to fluid pooling  Will order lasix IV x1 this evening after therapies. 21. Cyanotic distal extremities,?Raynaud's: Improved at present  vascular consultation, appreciate consult  Vascular  arterial ultrasound ordered, which were relatively unremarkable  Resolution with heat pack yesterday  Patient blood pressure unable to tolerate CCB this time  LOS (Days) 4 A FACE TO FACE EVALUATION WAS PERFORMED  Neeko Pharo Lorie Phenix 12/19/2015 10:38 AM

## 2015-12-19 NOTE — Progress Notes (Signed)
Occupational Therapy Session Note  Patient Details  Name: [name] MRN: [id number] Date of Birth: [birth year]  Today's Date: 12/19/2015 OT Individual Time: [id number] OT Individual Time Calculation (min): 86 min    Short Term Goals: Week 1:  OT Short Term Goal 1 (Week 1): STGs=LTGS secondary to short estimated LOS  Skilled Therapeutic Interventions/Progress Updates:    Upon entering the room, pt supine in bed with no c/o pain. Pt reports, "I just got back from physical therapy and I am worn out." Pt declined shower this session and initially refusing to exit bed this session. Pt performed supine >sit with supervision. Pt ambulating 5' with RW and steady assist to sit in wheelchair for lunch. OT providing supervision and min verbal cues for swallowing strategies this session while providing further education regarding energy conservation. Pt verbalizing understanding. After meal, pt ambulating with RW 10' into bathroom for toileting with steady assist for hygiene and clothing management. Pt washing peri area and buttocks while seated on commode. He also changed clothing items while seated on commode with set up A from therapist. Pt returning to bed at end of session as pt declined to sit up in wheelchair. Call bell and all needed items within reach upon exiting the room.   Therapy Documentation Precautions:  Precautions Precautions: Fall Restrictions Weight Bearing Restrictions: No General:   Vital Signs: Therapy Vitals Temp: 98.7 F (37.1 C) Temp Source: Oral Pulse Rate: 89 Resp: 18 BP: (!) 111/54 mmHg Patient Position (if appropriate): Lying Oxygen Therapy SpO2: 100 % O2 Device: Not Delivered Pain: Pain Assessment Pain Assessment: 0-10 Pain Score: 9  Pain Location: Leg Pain Orientation: Left Pain Descriptors / Indicators: Cramping Pain Onset: On-going Patients Stated Pain Goal: 5  See Function Navigator for Current Functional Status.   Therapy/Group: Individual  Therapy  [name] 12/19/2015, 4:41 PM

## 2015-12-19 NOTE — Progress Notes (Signed)
Speech Language Pathology Daily Session Note  Patient Details  Name: [name] MRN: [id number] Date of Birth: 1924/12/12  Today's Date: 12/19/2015 SLP Individual Time: [id number] SLP Individual Time Calculation (min): 45 min  Short Term Goals: Week 1: SLP Short Term Goal 1 (Week 1): Patient will demonstrate complex problem solving for familiar tasks with Mod I.  SLP Short Term Goal 2 (Week 1): Patient will recall new, daily information with use of external aids with Mod I.  SLP Short Term Goal 3 (Week 1): Patient will consume current diet with minimal overt s/s of aspiration with Mod I for use of swallowing strategies.   Skilled Therapeutic Interventions: Skilled treatment session focused on addressing dysphagia and cognition goals. SLP facilitated session by providing assist to recall and carryout consistnet use of safe swallow strategies during breakfast of puree (thinned with thin liquids) and thin liquids via cup.  Initially, patient reported that his swallowing was just fine, but he has bad teeth and difficulty chewing food.  As a result, SLP also faciliated session with re-education regarding MBS results and with Mod cues patient recalled that there was a PNA risk with his eating and drinking.  Patient also required Mod verbal cues to recall current medications during RN administration.  Continue with current plan of care.    Function:  Eating Eating   Modified Consistency Diet: Yes Eating Assist Level: Supervision or verbal cues;More than reasonable amount of time           Cognition Comprehension Comprehension assist level: Follows basic conversation/direction with extra time/assistive device  Expression   Expression assist level: Expresses basic needs/ideas: With no assist  Social Interaction Social Interaction assist level: Interacts appropriately 90% of the time - Needs monitoring or encouragement for participation or interaction.  Problem Solving Problem solving assist  level: Solves basic 90% of the time/requires cueing < 10% of the time  Memory Memory assist level: Recognizes or recalls 50 - 74% of the time/requires cueing 25 - 49% of the time    Pain Pain Assessment Pain Assessment: No/denies pain  Therapy/Group: Individual Therapy  Carmelia Roller., [place] [id number]  [place] 12/19/2015, 8:29 AM

## 2015-12-19 NOTE — Progress Notes (Signed)
Physical Therapy Session Note  Patient Details  Name: [name] MRN: [id number] Date of Birth: [birth year]  Today's Date: 12/19/2015 PT Individual Time: 1100-1200 PT Individual Time Calculation (min): 60 min   Short Term Goals: Week 1:  PT Short Term Goal 1 (Week 1): =LTGs due to ELOS  Skilled Therapeutic Interventions/Progress Updates:    patient received supine in bed and agreeable to PT. Bed mobility with supervision A for supine to sit with use of bed rails.   Stand pivot transfer x 5 throughout session with supervision A.  WC mobility for 11ft with 1 rest breack. With cues for improved BUE propulsion to allow better obstacle navigation.  Gait training for 76ft x 3 with RW, and min cues for proper AD management and improved step length. Patient was noted to have significant fatigue following each bout of gait training requiring prolonged rest break.   Nustep x 5 minutes with 1 rest break level 2  Toe taps on 6 inch step 2x 10 reps. With BUE support on RW.  PT provided supervision A for all therex include cues to maintain steps per minute >25 and for increased hip flexion with toe taps to allow full clearance of BLE with placing foot up and down on step.   Patient returned to room in [facility] and performed stand pivot to bed. Supervision A fro mPT for sit>supine  With HOB slightly elevated.   Throughout treatment Patient complained of "cramping" pain that was moving through leg to Calf, HS, and hips.   Therapy Documentation Precautions:  Precautions Precautions: Fall Restrictions Weight Bearing Restrictions: No   Pain: Pain Assessment Pain Assessment: 0-10 Pain Score: 9  Pain Location: Leg Pain Orientation: Left Pain Descriptors / Indicators: Cramping Pain Onset: On-going Patients Stated Pain Goal: 5   See Function Navigator for Current Functional Status.   Therapy/Group: Individual Therapy  Lorie Phenix 12/19/2015, 12:53 PM

## 2015-12-19 NOTE — Progress Notes (Signed)
Late Entry  Placed heat packs on patient's hands per Dr. Serita Grit request. Rechecked hands after 15-20 minutes with no noticeable change. Hands still cool to touch and fingertips still bluish in color.  Dorthula Nettles, RN

## 2015-12-19 NOTE — Progress Notes (Signed)
Wrapped patient's elbows per Dr. Serita Grit request using ABD pads and kerlix. No longer using elbow protectors with velcro.  Dorthula Nettles, RN

## 2015-12-20 ENCOUNTER — Inpatient Hospital Stay (HOSPITAL_COMMUNITY): Payer: Medicare Other

## 2015-12-20 DIAGNOSIS — M79672 Pain in left foot: Secondary | ICD-10-CM

## 2015-12-20 DIAGNOSIS — M79673 Pain in unspecified foot: Secondary | ICD-10-CM | POA: Insufficient documentation

## 2015-12-20 LAB — GLUCOSE, CAPILLARY
Glucose-Capillary: 90 mg/dL (ref 65–99)
Glucose-Capillary: 139 mg/dL — ABNORMAL HIGH (ref 65–99)
Glucose-Capillary: 147 mg/dL — ABNORMAL HIGH (ref 65–99)
Glucose-Capillary: 148 mg/dL — ABNORMAL HIGH (ref 65–99)

## 2015-12-20 NOTE — Progress Notes (Signed)
[place] PHYSICAL MEDICINE & REHABILITATION     PROGRESS NOTE  Subjective/Complaints:  Patient sitting up in bed this morning. He states he has some left heel pain. He notices after his injury happened and he became more aware of things.  ROS: +RUE weeping. Denies CP, SOB, N/V/D.   Objective: Vital Signs: Blood pressure 85/49, pulse 89, temperature 98.7 F (37.1 C), temperature source Oral, resp. rate 18, height 5\' 7"  (1.702 m), weight 49.4 kg (108 lb 14.5 oz), SpO2 100 %. No results found.  Recent Labs  12/19/15 0543  WBC 12.2*  HGB 9.8*  HCT 29.7*  PLT 125*    Recent Labs  12/18/15 0813 12/19/15 0543  NA 135 135  K 3.8 4.3  CL 94* 97*  GLUCOSE 157* 77  BUN 38* 42*  CREATININE 1.17 1.09  CALCIUM 7.6* 7.6*   CBG (last 3)   Recent Labs  12/19/15 1653 12/19/15 2050 12/20/15 0646  GLUCAP 190* 188* 90    Wt Readings from Last 3 Encounters:  12/19/15 49.4 kg (108 lb 14.5 oz)  12/13/15 50 kg (110 lb 3.7 oz)  12/11/15 52.935 kg (116 lb 11.2 oz)    Physical Exam:  BP 85/49 mmHg  Pulse 89  Temp(Src) 98.7 F (37.1 C) (Oral)  Resp 18  Ht 5\' 7"  (1.702 m)  Wt 49.4 kg (108 lb 14.5 oz)  BMI 17.05 kg/m2  SpO2 100% Gen: NAD. Vital signs reviewed.  HENT: Bruising to left orbital area and side of face (improving) Eyes: EOM are normal.  Cardiovascular: Cardiac rate controlled  Respiratory: Effort normal and breath sounds normal. No respiratory distress.  GI: Soft. Bowel sounds are normal. He exhibits no distension.  MSK: +Edema Right upper extremity. + TTP left heel  Neurological: He is alert and oriented.  Follows commands.  B/l UE grossly 4+/5 prox to distal.  B/l LE: 4-/5 HF, KE 4-, ADF/PF 4/5.   Skin: Skin is warm and dry.  Numerous bruises on his arms and legs.   Weeping from right proximal forearm  Finger tips improved, no longer cyonotic Psychiatric: He has a normal mood and affect. His behavior is normal.   Assessment/Plan: 1. Functional deficits  secondary to GI bleed status post epi injection and cauterization/multi-medical which require 3+ hours per day of interdisciplinary therapy in a comprehensive inpatient rehab setting. Physiatrist is providing close team supervision and 24 hour management of active medical problems listed below. Physiatrist and rehab team continue to assess barriers to discharge/monitor patient progress toward functional and medical goals.  Function:  Bathing Bathing position Bathing activity did not occur: Refused Position: Wheelchair/chair at sink  Bathing parts Body parts bathed by patient: Right arm, Left arm, Chest, Abdomen, Right upper leg, Left upper leg, Front perineal area, Buttocks Body parts bathed by helper: Right lower leg, Left lower leg, Back  Bathing assist Assist Level: Touching or steadying assistance(Pt > 75%)      Upper Body Dressing/Undressing Upper body dressing   What is the patient wearing?: Pull over shirt/dress     Pull over shirt/dress - Perfomed by patient: Thread/unthread right sleeve, Thread/unthread left sleeve, Put head through opening, Pull shirt over trunk Pull over shirt/dress - Perfomed by helper: Pull shirt over trunk        Upper body assist Assist Level: Set up   Set up : To obtain clothing/put away  Lower Body Dressing/Undressing Lower body dressing   What is the patient wearing?: Pants     Pants- Performed by  patient: Thread/unthread right pants leg, Thread/unthread left pants leg, Pull pants up/down Pants- Performed by helper: Thread/unthread right pants leg, Thread/unthread left pants leg   Non-skid slipper socks- Performed by helper: Don/doff right sock, Don/doff left sock                  Lower body assist Assist for lower body dressing: Touching or steadying assistance (Pt > 75%)      Toileting Toileting   Toileting steps completed by patient: Adjust clothing prior to toileting, Performs perineal hygiene, Adjust clothing after toileting    Toileting Assistive Devices: Grab bar or rail  Toileting assist Assist level: Touching or steadying assistance (Pt.75%)   Transfers Chair/bed transfer   Chair/bed transfer method: Stand pivot, Ambulatory Chair/bed transfer assist level: Supervision or verbal cues Chair/bed transfer assistive device: Armrests, Medical sales representative     Max distance: 100 Assist level: Supervision or verbal cues   Wheelchair   Type: Manual Max wheelchair distance: 157 Assist Level: Supervision or verbal cues  Cognition Comprehension Comprehension assist level: Follows basic conversation/direction with extra time/assistive device  Expression Expression assist level: Expresses basic needs/ideas: With no assist  Social Interaction Social Interaction assist level: Interacts appropriately 90% of the time - Needs monitoring or encouragement for participation or interaction.  Problem Solving Problem solving assist level: Solves basic 90% of the time/requires cueing < 10% of the time  Memory Memory assist level: Recognizes or recalls 50 - 74% of the time/requires cueing 25 - 49% of the time    Medical Problem List and Plan: 1. Debilitation and resolving encephalopathy secondary to GI bleed status post epi injection and cauterization/multi-medical  -Continue CIR 2. DVT Prophylaxis/Anticoagulation: Superficial thrombus right basilic vein and left cephalic vein.   No anticoagulation at this time due to GI bleed.   Will consider follow-up Doppler study in future. 3. Pain Management: Low-dose oxycodone 5 mg every 4 hours as needed. Monitor mental status 4. Seizure prophylaxis. Depakote 750 mg every 12 hours. 5. Neuropsych: This patient is capable of making decisions on his own behalf. 6. Skin/Wound Care: Routine skin checks, monitoring of scattered abrasions 7. Fluids/Electrolytes/Nutrition: Routine I&O's   Hyperkalemia: Resolved, 4.3 on 5/6  Will order labs for tomorrow 8. CAD status post  CABG. No chest pain or shortness of breath. 9. History of atrial fibrillation.   Coumadin remains on hold due to GI bleed consider resuming in 2 weeks if no further bleeding versus aspirin (?~5/10).   Continue Lanoxin 0.0625 mg daily 10. Systolic diastolic congestive heart failure. Monitor for any signs of fluid overload.  Weight stable at around 49 KG's 11. Escherichia coli urinary tract infection. Last day of Rocephin 12/16/2015  12. Gout. Zyloprim 300 mg daily 13. Hyperlipidemia. Pravachol 14. Hypotension:  Likely exacerbated by third spacing, protein added on 5/4  Will encourage fluid intake 15. Prediabetes  Will cont to monitor 16. Dysphagia  Will advance diet as tolerated and transition to carb controlled diet 17. Acute blood loss anemia  Hb 10.2 on 5/4 (labs from 5/6, likely dilutional)  Will cont to monitor  Will order labs for tomorrow 18. Leukocytosis  WBCs 13.9 on 5/4 (labs from 5/6, likely dilutional)  Ucx no growth from 5/2  Repeat chest x-ray 5/4 reviewed, relatively unremarkable  Will cont to monitor  Will order labs for tomorrow 19. CKD  Cr  1.09 on 5/6  Will cont to monitor  Will order labs for tomorrow 20. Peripheral edema  Dependent  edema bilateral upper extremities, right greater than left  Encourage patient to elevate upper extremities  Absorption dressing in place  Lasix IV x1 ordered on 4/6  21. Cyanotic distal extremities,?Raynaud's: Improved at present  vascular consultation, appreciate consult  Vascular arterial ultrasound ordered, which were relatively unremarkable  Resolution with heat pack yesterday  Patient blood pressure unable to tolerate CCB this time 22. Left heel pain  Will order x-rays  Possible Achilles tendinopathy  LOS (Days) 5 A FACE TO FACE EVALUATION WAS PERFORMED  Rebakah Cokley Lorie Phenix 12/20/2015 8:18 AM

## 2015-12-21 ENCOUNTER — Inpatient Hospital Stay (HOSPITAL_COMMUNITY): Payer: Medicare Other | Admitting: Occupational Therapy

## 2015-12-21 ENCOUNTER — Inpatient Hospital Stay (HOSPITAL_COMMUNITY): Payer: Medicare Other | Admitting: Physical Therapy

## 2015-12-21 ENCOUNTER — Inpatient Hospital Stay (HOSPITAL_COMMUNITY): Payer: Medicare Other | Admitting: Speech Pathology

## 2015-12-21 LAB — CBC WITH DIFFERENTIAL/PLATELET
BASOS ABS: 0.1 10*3/uL (ref 0.0–0.1)
Basophils Relative: 0 %
EOS ABS: 0.2 10*3/uL (ref 0.0–0.7)
EOS PCT: 2 %
HCT: 30.8 % — ABNORMAL LOW (ref 39.0–52.0)
Hemoglobin: 9.9 g/dL — ABNORMAL LOW (ref 13.0–17.0)
LYMPHS ABS: 1.5 10*3/uL (ref 0.7–4.0)
Lymphocytes Relative: 13 %
MCH: 31.4 pg (ref 26.0–34.0)
MCHC: 32.1 g/dL (ref 30.0–36.0)
MCV: 97.8 fL (ref 78.0–100.0)
MONO ABS: 1.5 10*3/uL — AB (ref 0.1–1.0)
Monocytes Relative: 13 %
Neutro Abs: 8 10*3/uL — ABNORMAL HIGH (ref 1.7–7.7)
Neutrophils Relative %: 72 %
PLATELETS: 140 10*3/uL — AB (ref 150–400)
RBC: 3.15 MIL/uL — AB (ref 4.22–5.81)
RDW: 20.1 % — AB (ref 11.5–15.5)
WBC: 11.2 10*3/uL — ABNORMAL HIGH (ref 4.0–10.5)

## 2015-12-21 LAB — BASIC METABOLIC PANEL
ANION GAP: 9 (ref 5–15)
BUN: 47 mg/dL — AB (ref 6–20)
CHLORIDE: 97 mmol/L — AB (ref 101–111)
CO2: 24 mmol/L (ref 22–32)
Calcium: 7.6 mg/dL — ABNORMAL LOW (ref 8.9–10.3)
Creatinine, Ser: 1.17 mg/dL (ref 0.61–1.24)
GFR calc Af Amer: >60 mL/min (ref >60)
GFR, EST NON AFRICAN AMERICAN: 54 mL/min — AB (ref >60)
GLUCOSE: 98 mg/dL (ref 65–99)
POTASSIUM: 5 mmol/L (ref 3.5–5.1)
Sodium: 130 mmol/L — ABNORMAL LOW (ref 135–145)

## 2015-12-21 LAB — GLUCOSE, CAPILLARY
GLUCOSE-CAPILLARY: 93 mg/dL (ref 65–99)
GLUCOSE-CAPILLARY: 184 mg/dL — AB (ref 65–99)
Glucose-Capillary: 148 mg/dL — ABNORMAL HIGH (ref 65–99)
Glucose-Capillary: 221 mg/dL — ABNORMAL HIGH (ref 65–99)

## 2015-12-21 NOTE — Progress Notes (Signed)
[facility] PHYSICAL MEDICINE & REHABILITATION     PROGRESS NOTE  Subjective/Complaints:  Patient sitting up in his chair this morning. He states he slept fairly overnight. She does not have any heel pain this morning.  ROS: +RUE weeping. Denies CP, SOB, N/V/D.   Objective: Vital Signs: Blood pressure 90/50, pulse 80, temperature 98.5 F (36.9 C), temperature source Oral, resp. rate 17, height 5\' 7"  (1.702 m), weight 49.8 kg (109 lb 12.6 oz), SpO2 99 %. Dg Foot 2 Views Left  12/20/2015  CLINICAL DATA:  Patient with hindfoot and heel pain status post fall 1 week prior. Initial encounter. EXAM: LEFT FOOT - 2 VIEW COMPARISON:  None. FINDINGS: Limited exam given patient positioning. The great toe is flexed on the AP view. No definite evidence for acute fracture or dislocation. Vascular calcifications. Midfoot degenerative changes. IMPRESSION: Limited exam.  No acute osseous abnormality. Electronically Signed   By: Lovey Newcomer M.D.   On: 12/20/2015 12:09    Recent Labs  12/19/15 0543 12/21/15 0647  WBC 12.2* 11.2*  HGB 9.8* 9.9*  HCT 29.7* 30.8*  PLT 125* 140*    Recent Labs  12/19/15 0543 12/21/15 0647  NA 135 130*  K 4.3 5.0  CL 97* 97*  GLUCOSE 77 98  BUN 42* 47*  CREATININE 1.09 1.17  CALCIUM 7.6* 7.6*   CBG (last 3)   Recent Labs  12/20/15 1646 12/20/15 2205 12/21/15 0629  GLUCAP 147* 148* 93    Wt Readings from Last 3 Encounters:  12/21/15 49.8 kg (109 lb 12.6 oz)  12/13/15 50 kg (110 lb 3.7 oz)  12/11/15 52.935 kg (116 lb 11.2 oz)    Physical Exam:  BP 90/50 mmHg  Pulse 80  Temp(Src) 98.5 F (36.9 C) (Oral)  Resp 17  Ht 5\' 7"  (1.702 m)  Wt 49.8 kg (109 lb 12.6 oz)  BMI 17.19 kg/m2  SpO2 99% Gen: NAD. Vital signs reviewed.  HENT: Bruising to left orbital area and side of face (improving) Eyes: EOM are normal.  Cardiovascular: Cardiac rate controlled  Respiratory: Effort normal and breath sounds normal. No respiratory distress.  GI: Soft. Bowel  sounds are normal. He exhibits no distension.  MSK: +Edema.  Right upper extremity. + TTP left heel  Neurological: He is alert and oriented.  Follows commands.  B/l UE grossly 4+/5 prox to distal.  B/l LE: 4-/5 HF, KE 4-, ADF/PF 4/5.   Skin: Skin is warm and dry.  Numerous bruises on his arms and legs.   Weeping from right proximal forearm  Finger tips improved, no longer cyonotic Psychiatric: He has a normal mood and affect. His behavior is normal.   Assessment/Plan: 1. Functional deficits secondary to GI bleed status post epi injection and cauterization/multi-medical which require 3+ hours per day of interdisciplinary therapy in a comprehensive inpatient rehab setting. Physiatrist is providing close team supervision and 24 hour management of active medical problems listed below. Physiatrist and rehab team continue to assess barriers to discharge/monitor patient progress toward functional and medical goals.  Function:  Bathing Bathing position Bathing activity did not occur: Refused Position: Wheelchair/chair at sink  Bathing parts Body parts bathed by patient: Right arm, Left arm, Chest, Abdomen, Front perineal area, Buttocks, Right upper leg, Left upper leg, Right lower leg, Left lower leg Body parts bathed by helper: Back  Bathing assist Assist Level: Supervision or verbal cues      Upper Body Dressing/Undressing Upper body dressing   What is the patient wearing?: Pull  over shirt/dress     Pull over shirt/dress - Perfomed by patient: Thread/unthread right sleeve, Thread/unthread left sleeve, Put head through opening, Pull shirt over trunk Pull over shirt/dress - Perfomed by helper: Pull shirt over trunk        Upper body assist Assist Level: Supervision or verbal cues   Set up : To obtain clothing/put away  Lower Body Dressing/Undressing Lower body dressing   What is the patient wearing?: Pants     Pants- Performed by patient: Thread/unthread right pants leg,  Thread/unthread left pants leg, Pull pants up/down Pants- Performed by helper: Thread/unthread right pants leg, Thread/unthread left pants leg   Non-skid slipper socks- Performed by helper: Don/doff right sock, Don/doff left sock                  Lower body assist Assist for lower body dressing: Supervision or verbal cues      Toileting Toileting   Toileting steps completed by patient: Performs perineal hygiene Toileting steps completed by helper: Adjust clothing prior to toileting, Performs perineal hygiene, Adjust clothing after toileting Toileting Assistive Devices: Grab bar or rail  Toileting assist Assist level: Touching or steadying assistance (Pt.75%)   Transfers Chair/bed transfer   Chair/bed transfer method: Stand pivot, Ambulatory Chair/bed transfer assist level: Supervision or verbal cues Chair/bed transfer assistive device: Armrests, Medical sales representative     Max distance: 100 Assist level: Supervision or verbal cues   Wheelchair   Type: Manual Max wheelchair distance: 157 Assist Level: Supervision or verbal cues  Cognition Comprehension Comprehension assist level: Follows complex conversation/direction with no assist, Follows basic conversation/direction with extra time/assistive device  Expression Expression assist level: Expresses complex ideas: With no assist  Social Interaction Social Interaction assist level: Interacts appropriately with others with medication or extra time (anti-anxiety, antidepressant).  Problem Solving Problem solving assist level: Solves basic problems with no assist  Memory Memory assist level: Recognizes or recalls 50 - 74% of the time/requires cueing 25 - 49% of the time    Medical Problem List and Plan: 1. Debilitation and resolving encephalopathy secondary to GI bleed status post epi injection and cauterization/multi-medical  -Continue CIR 2. DVT Prophylaxis/Anticoagulation: Superficial thrombus right basilic  vein and left cephalic vein.   No anticoagulation at this time due to GI bleed.   Repeat Doppler today. 3. Pain Management: Low-dose oxycodone 5 mg every 4 hours as needed. Monitor mental status 4. Seizure prophylaxis. Depakote 750 mg every 12 hours. 5. Neuropsych: This patient is capable of making decisions on his own behalf. 6. Skin/Wound Care: Routine skin checks, monitoring of scattered abrasions 7. Fluids/Electrolytes/Nutrition: Routine I&O's   Hyperkalemia: 5.0 on 5/8   Will order labs for tomorrow 8. CAD status post CABG. No chest pain or shortness of breath. 9. History of atrial fibrillation.   Coumadin remains on hold due to GI bleed consider resuming in 2 weeks if no further bleeding versus aspirin (?~5/9).   Continue Lanoxin 0.0625 mg daily 10. Systolic diastolic congestive heart failure. Monitor for any signs of fluid overload.  Weight stable at around 49 KG's 11. Escherichia coli urinary tract infection. Last day of Rocephin 12/16/2015  12. Gout. Zyloprim 300 mg daily 13. Hyperlipidemia. Pravachol 14. Hypotension:  Likely exacerbated by third spacing, protein added on 5/4  Will encourage fluid intake 15. Prediabetes  Will cont to monitor 16. Dysphagia  Will advance diet as tolerated and transition to carb controlled, low potassium diet 17. Acute blood loss anemia  Hb 9.9 on 5/8  Will cont to monitor 18. Leukocytosis  WBCs 11.2 on 5/8  Ucx no growth from 5/2  Repeat chest x-ray 5/4 reviewed, relatively unremarkable  Will cont to monitor 19. CKD  Cr  1.17 on 5/8  Will cont to monitor  Will order labs for tomorrow 20. Peripheral edema  Dependent edema bilateral upper extremities, right greater than left  Encourage patient to elevate upper extremities  Absorption dressing in place  Lasix IV x1 ordered on 5/6  21. Cyanotic distal extremities,?Raynaud's: Improved at present  vascular consultation, appreciate consult  Vascular arterial ultrasound ordered, which were  relatively unremarkable  Patient blood pressure unable to tolerate CCB this time 22. Left heel pain  X-ray from 5/7 reviewed, relatively unremarkable  Possible mild Achilles tendinopathy  LOS (Days) 6 A FACE TO FACE EVALUATION WAS PERFORMED  Jolynn Bajorek Lorie Phenix 12/21/2015 9:39 AM

## 2015-12-21 NOTE — Progress Notes (Signed)
Occupational Therapy Session Note  Patient Details  Name: [name] MRN: [id number] Date of Birth: 1925-04-16  Today's Date: 12/21/2015 OT Individual Time: 0703-0800 OT Individual Time Calculation (min): 57 min    Short Term Goals: Week 1:  OT Short Term Goal 1 (Week 1): STGs=LTGS secondary to short estimated LOS  Skilled Therapeutic Interventions/Progress Updates:    Upon entering the room, pt supine in bed with 6/10 c/o pain in R shoulder with reports of receiving medication prior to therapist arrival. Pt requiring encouragement to participate in therapy session as pt is not motivated for activity. Pt ambulated with RW and close supervision to dresser to obtain clothing items needed for dressing. Pt declined shower but agreeable to bathing with sit <>stand at sink side with supervision as needed. Pt needing min verbal cues for safety. Pt needing multiple rest breaks secondary to fatigue but pt able to perform up to 5 minutes of activity before needing break this session. Pt remained in wheelchair at end of session with call bell and all needed items within reach upon exiting the room.   Therapy Documentation Precautions:  Precautions Precautions: Fall Restrictions Weight Bearing Restrictions: No General:   Vital Signs: Therapy Vitals Temp: 98.5 F (36.9 C) Temp Source: Oral Pulse Rate: 80 Resp: 17 BP: (!) 90/50 mmHg Patient Position (if appropriate): Lying Oxygen Therapy SpO2: 99 % O2 Device: Not Delivered Pain: Pain Assessment Pain Assessment: No/denies pain Pain Score: 6  Pain Type: Acute pain Pain Location: Shoulder Pain Orientation: Right Pain Descriptors / Indicators: Aching Pain Frequency: Intermittent Patients Stated Pain Goal: 2 Pain Intervention(s): Medication (See eMAR)  See Function Navigator for Current Functional Status.   Therapy/Group: Individual Therapy  [name] 12/21/2015, 9:38 AM

## 2015-12-21 NOTE — Progress Notes (Signed)
Physical Therapy Session Note  Patient Details  Name: TEAGUN TIVNAN MRN: [id number] Date of Birth: [birth year]  Today's Date: 12/21/2015 PT Individual Time: 0908-1004 PT Individual Time Calculation (min): 56 min   Short Term Goals: Week 1:  PT Short Term Goal 1 (Week 1): =LTGs due to ELOS  Skilled Therapeutic Interventions/Progress Updates:    Pt received resting in w/c, completing his breakfast, no c/o pain, and agreeable to therapy session.  PT facilitated session by providing min verbal cues for pt to use safe swallowing strategies as he finished his breakfast in a moderately distracting environment.  PT facilitated increasingly distracting environment to challenge pt's alternating attention with safe swallow strategies and conversation.  Pt demonstrated cough x4 with thin liquids and required min verbal cues for slow rate of liquid consumption.  Pt demos throat clear and dry swallow without cues.   Pt performs sit<>stand and stand/pivot transfers throughout session with RW and supervision.    Gait training x100' with RW and close supervision.  Pt reporting cramping in LLE that starts in low back and radiates down LE.  Pt also reporting pain in L ankle and states that an xray was taken yesterday.    PT instructed pt in PROM stretching to hamstrings, glutes, piriformis, and hip flexors which pt returned demonstration for.  PT provided verbal cues for increasing/decreasing stretch to comfort and pt returned demonstration and verbalized understanding.    Handoff to SLP at end of session in hallway.   Therapy Documentation Precautions:  Precautions Precautions: Fall Restrictions Weight Bearing Restrictions: No  See Function Navigator for Current Functional Status.   Therapy/Group: Individual Therapy  Jyquan Kenley E Penven-Crew 12/21/2015, 11:55 AM

## 2015-12-21 NOTE — Progress Notes (Signed)
Speech Language Pathology Daily Session Note  Patient Details  Name: [name] MRN: [id number] Date of Birth: 01/28/1925  Today's Date: 12/21/2015 SLP Individual Time: 1000-1100 SLP Individual Time Calculation (min): 60 min  Short Term Goals: Week 1: SLP Short Term Goal 1 (Week 1): Patient will demonstrate complex problem solving for familiar tasks with Mod I.  SLP Short Term Goal 2 (Week 1): Patient will recall new, daily information with use of external aids with Mod I.  SLP Short Term Goal 3 (Week 1): Patient will consume current diet with minimal overt s/s of aspiration with Mod I for use of swallowing strategies.   Skilled Therapeutic Interventions: Skilled treatment session focused on dysphagia and cognition goals. SLP facilitated session by providing min A verbal cues fading to Mod I for complex problem solving for familiar tasks (medication management). Pt was able to independently recall 1 swallow strategy and required Min A verbal cues for recall of additional ones. Pt able to implement strategies with Mod I during consumption of thin liquids via cup. Pt will one immediate cough following consumption of thin liquids. Pt able to independently demonstrate single sips with throat clear for all consumption. It should be noted that pt's voice sounded wet throughout session and throat clears present even when pt wasn't consuming POs. Pt was returned to room, left in wheelchair with all needs within reach. Continue current plan of care.   Function:  Eating Eating   Modified Consistency Diet: Yes Eating Assist Level: More than reasonable amount of time;Supervision or verbal cues           Cognition Comprehension Comprehension assist level: Follows basic conversation/direction with extra time/assistive device  Expression   Expression assist level: Expresses complex ideas: With extra time/assistive device  Social Interaction Social Interaction assist level: Interacts appropriately with  others with medication or extra time (anti-anxiety, antidepressant).  Problem Solving Problem solving assist level: Solves basic problems with no assist  Memory Memory assist level: Recognizes or recalls 50 - 74% of the time/requires cueing 25 - 49% of the time    Pain    Therapy/Group: Individual Therapy  Aeisha Minarik 12/21/2015, 2:52 PM

## 2015-12-21 NOTE — Progress Notes (Signed)
Speech Language Pathology Daily Session Note  Patient Details  Name: [name] MRN: [id number] Date of Birth: 1925-03-19  Today's Date: 12/21/2015 SLP Individual Time: 1500-1527 SLP Individual Time Calculation (min): 27 min  Short Term Goals: Week 1: SLP Short Term Goal 1 (Week 1): Patient will demonstrate complex problem solving for familiar tasks with Mod I.  SLP Short Term Goal 2 (Week 1): Patient will recall new, daily information with use of external aids with Mod I.  SLP Short Term Goal 3 (Week 1): Patient will consume current diet with minimal overt s/s of aspiration with Mod I for use of swallowing strategies.   Skilled Therapeutic Interventions: Skilled treatment session focused on cognitive goals. SLP facilitated session by providing total A for recall of his current discharge date and engaging patient in a functional conversation about his current swallowing, diet recommendations and compensatory strategies in preparation for his discharge home. Patient verbalized he wasn't sure if he wanted to continue the full liquid diet at home. SLP provided re-education to the patient in regards to his current swallowing impairments and clinical reasoning for diet recommendation but also educated the patient on making the best decision for himself as long as he is fully aware of his aspiration risk and risk for developing aspiration PNA which could possibly be fatal. He verbalized understanding and reported he will discuss options with his wife. Patient left upright in bed with all needs within reach. Continue with current plan of care.    Function:  Cognition Comprehension Comprehension assist level: Follows basic conversation/direction with extra time/assistive device  Expression   Expression assist level: Expresses complex ideas: With extra time/assistive device  Social Interaction Social Interaction assist level: Interacts appropriately with others with medication or extra time  (anti-anxiety, antidepressant).  Problem Solving Problem solving assist level: Solves basic problems with no assist  Memory Memory assist level: Recognizes or recalls 75 - 89% of the time/requires cueing 10 - 24% of the time    Pain No/Denies Pain   Therapy/Group: Individual Therapy  Juquan Reznick, Kirkman 12/21/2015, 3:51 PM

## 2015-12-21 NOTE — Progress Notes (Signed)
Nutrition Follow-up  DOCUMENTATION CODES:   Severe malnutrition in context of chronic illness, Underweight  INTERVENTION:  Continue Ensure Enlive po BID, each supplement provides 350 kcal and 20 grams of protein.  Encourage adequate PO intake.   NUTRITION DIAGNOSIS:   Malnutrition related to chronic illness as evidenced by severe depletion of body fat, severe depletion of muscle mass; ongoing  GOAL:   Patient will meet greater than or equal to 90% of their needs; met  MONITOR:   PO intake, Supplement acceptance, Weight trends, Labs, I & O's, Diet advancement, Skin  REASON FOR ASSESSMENT:   Malnutrition Screening Tool    ASSESSMENT:   82 y.o. right handed male with history of atrial fibrillation maintained on chronic Coumadin, coronary artery disease status post CABG, systolic and diastolic congestive heart failure, type 2 diabetes mellitus, bladder cancer. Presented 12/05/2015 with bright red blood from the rectum as well as a recent fall . findings of a single solitary ulcer in the rectum and in the distal rectum. findings of a single solitary ulcer in the rectum and in the distal rectum.Underwent epi injection and then cauterization.  Meal completion has been 90-100% while pt has been on a full liquid diet. Pt reports appetite is fine with no other difficulties. Pt has been consuming his Ensure. RD to continue with current orders. Pt encouraged to eat his food at meals and to drink his supplements.   Labs and medications reviewed.   Diet Order:  Diet full liquid Room service appropriate?: Yes; Fluid consistency:: Thin  Skin:  Reviewed, no issues  Last BM:  5/5  Height:   Ht Readings from Last 1 Encounters:  12/15/15 _0  (1.702 m)    Weight:   Wt Readings from Last 1 Encounters:  12/21/15 109 lb 12.6 oz (49.8 kg)    Ideal Body Weight:  67.27 kg  BMI:  Body mass index is 17.19 kg/(m^2).  Estimated Nutritional Needs:   Kcal:  1650-1850  Protein:  75-85  grams  Fluid:  1.6 - 1.8 L/day  EDUCATION NEEDS:   No education needs identified at this time  Corrin Parker, MS, RD, LDN Pager # [phone] After hours/ weekend pager # [phone]

## 2015-12-22 ENCOUNTER — Inpatient Hospital Stay (HOSPITAL_COMMUNITY): Payer: Medicare Other | Admitting: Speech Pathology

## 2015-12-22 ENCOUNTER — Inpatient Hospital Stay (HOSPITAL_COMMUNITY): Payer: Medicare Other | Admitting: Physical Therapy

## 2015-12-22 ENCOUNTER — Inpatient Hospital Stay (HOSPITAL_COMMUNITY): Payer: Medicare Other | Admitting: Occupational Therapy

## 2015-12-22 ENCOUNTER — Inpatient Hospital Stay (HOSPITAL_COMMUNITY): Payer: Medicare Other

## 2015-12-22 DIAGNOSIS — R609 Edema, unspecified: Secondary | ICD-10-CM

## 2015-12-22 LAB — BASIC METABOLIC PANEL
Anion gap: 13 (ref 5–15)
BUN: 51 mg/dL — ABNORMAL HIGH (ref 6–20)
CALCIUM: 8.2 mg/dL — AB (ref 8.9–10.3)
CHLORIDE: 99 mmol/L — AB (ref 101–111)
CO2: 22 mmol/L (ref 22–32)
CREATININE: 1.25 mg/dL — AB (ref 0.61–1.24)
GFR, EST AFRICAN AMERICAN: 57 mL/min — AB (ref >60)
GFR, EST NON AFRICAN AMERICAN: 50 mL/min — AB (ref >60)
Glucose, Bld: 98 mg/dL (ref 65–99)
Potassium: 6 mmol/L — ABNORMAL HIGH (ref 3.5–5.1)
SODIUM: 134 mmol/L — AB (ref 135–145)

## 2015-12-22 LAB — GLUCOSE, CAPILLARY
GLUCOSE-CAPILLARY: 95 mg/dL (ref 65–99)
GLUCOSE-CAPILLARY: 99 mg/dL (ref 65–99)
GLUCOSE-CAPILLARY: 138 mg/dL — AB (ref 65–99)
Glucose-Capillary: 139 mg/dL — ABNORMAL HIGH (ref 65–99)

## 2015-12-22 MED ORDER — SODIUM POLYSTYRENE SULFONATE 15 GM/60ML PO SUSP
45.0000 g | Freq: Once | ORAL | Status: AC
  Administered 2015-12-22: 45 g via ORAL
  Filled 2015-12-22: qty 180

## 2015-12-22 NOTE — Progress Notes (Signed)
Physical Therapy Session Note  Patient Details  Name: [name] MRN: [id number] Date of Birth: May 29, 1925  Today's Date: 12/22/2015 PT Individual Time: 1120-1220 PT Individual Time Calculation (min): 60 min   Short Term Goals: Week 1:  PT Short Term Goal 1 (Week 1): =LTGs due to ELOS  Skilled Therapeutic Interventions/Progress Updates:    Pt received resting in bed with wife present.  No c/o pain and agreeable to therapy session.  Session focus on pt/family education, transfers, gait training, stair negotiation, and endurance.   Pt performs sit<>stand throughout session with mod I and RW.  Transfers between surfaces with RW stand/pivot or ambulatory with supervision for safety.    Gait training x125' +150' +125' with RW and distant supervision with extended seated rest breaks after each trial.  Pt demos decreased stride length and endurance but is improving in distance daily.  PT instructed pt in stair negotiation x4 steps with 2 handrails and supervision.    Pt's wife asked many questions regarded pt's upcoming discharge regarding diet, medications, dressings for weeping UEs, and mobility.  Several times pt's wife asked whether he would be ready to come home on scheduled d/c date of 5/11 or whether he should go to SNF for further rehab.  PT explained pt's progress and goal level of modified independent by d/c date and that pt was on track to meet goal.  PT explained that pt's mobility was largely limited by endurance but that he is currently demonstrating all mobility with no physical assistance and is performing at supervision level or better.  PT discussed recommendations for f/u with HHPT to progress endurance at home.  Wife also asking about using a w/c at d/c, as pt is currently ambulating 100-150' daily with PT on RW with supervision, do not think he will require a w/c at d/c.    Pt returned to room at end of session and left upright in w/c to consume lunch with call bell in reach and  needs met.   Therapy Documentation Precautions:  Precautions Precautions: Fall Restrictions Weight Bearing Restrictions: No   See Function Navigator for Current Functional Status.   Therapy/Group: Individual Therapy  [name] 12/22/2015, 2:52 PM

## 2015-12-22 NOTE — Progress Notes (Signed)
Speech Language Pathology Daily Session Note  Patient Details  Name: [name] MRN: [id number] Date of Birth: October 04, 1924  Today's Date: 12/22/2015 SLP Individual Time: 0930-1015 SLP Individual Time Calculation (min): 45 min  Short Term Goals: Week 1: SLP Short Term Goal 1 (Week 1): Patient will demonstrate complex problem solving for familiar tasks with Mod I.  SLP Short Term Goal 2 (Week 1): Patient will recall new, daily information with use of external aids with Mod I.  SLP Short Term Goal 3 (Week 1): Patient will consume current diet with minimal overt s/s of aspiration with Mod I for use of swallowing strategies.   Skilled Therapeutic Interventions: Skilled treatment session focused on cognitive and dysphagia goals. Patient was Mod I for functional problem solving with basic money management tasks and required supervision verbal cues for functional problem solving with complex money management tasks, suspect due to decreased mental flexibility. SLP also facilitated session by providing minimal trials of Dys. 2 textures with thin liquids via cup. Patient demonstrated mildly prolonged but efficient mastication and demonstrated multiple swallows with an intermittent wet vocal quality that patient independently cleared with a throat clear. Patient transferred back to bed at end of session with supervision. Patient left with alarm on and all needs within reach. Continue with current plan of care.    Function:  Eating Eating   Modified Consistency Diet: Yes Eating Assist Level: More than reasonable amount of time;Supervision or verbal cues           Cognition Comprehension Comprehension assist level: Follows basic conversation/direction with extra time/assistive device  Expression   Expression assist level: Expresses complex ideas: With extra time/assistive device  Social Interaction Social Interaction assist level: Interacts appropriately with others with medication or extra time  (anti-anxiety, antidepressant).  Problem Solving Problem solving assist level: Solves basic problems with no assist  Memory Memory assist level: Recognizes or recalls 90% of the time/requires cueing < 10% of the time    Pain No/Denies Pain   Therapy/Group: Individual Therapy  Sharicka Pogorzelski 12/22/2015, 2:53 PM

## 2015-12-22 NOTE — Progress Notes (Signed)
VASCULAR LAB PRELIMINARY  PRELIMINARY  PRELIMINARY  PRELIMINARY  Bilateral upper extremity venous duplex completed.    Preliminary report:  Right:  Progression of basilic vein thrombus into the axillary and subclavian veins.  Left:  No progression noted.  Thrombus remains in the cephalic vein in the forearm.  Report given to [facility], RN  Miesha Bachmann, RVT 12/22/2015, 2:46 PM

## 2015-12-22 NOTE — Progress Notes (Signed)
Occupational Therapy Session Note  Patient Details  Name: NABOR NEESON MRN: [id number] Date of Birth: 1925/05/14  Today's Date: 12/22/2015 OT Individual Time: 0703- 0800 [id number] OT Individual Time Calculation (min): 57 min and 30 min    Short Term Goals: Week 1:  OT Short Term Goal 1 (Week 1): STGs=LTGS secondary to short estimated LOS  Skilled Therapeutic Interventions/Progress Updates:    Session 1: Upon entering the room, pt supine in bed with no c/o pain this session. Pt has B forearms wrapped secondary to "weeping" and therefore declined shower. Pt agreeable to bathing at sink with supervision only this session. Pt needing increased time to make transitions during functional task. Pt returning to wheelchair for breakfast with supervision from therapist and min verbal cues for swallowing strategies. Pt remaining in wheelchair at end of session with call bell and all needed items within reach upon exiting the room. Pt needing 5 rest breaks this session secondary to fatigue.   Session 2: Upon entering the room, pt in bed eating meal with wife present in the room. Pt immediately transferred from bed >wheelchair to continue meal secondary to OOB instructions on swallowing strategies safety sheet. Pt transferred with RW and mod I. His wife educated on strategies as printed on swallowing safety sheet. Pt's wife having several questions regarding upcoming discharge recommendations. OT recommending shower chair and HHOT at discharge. Education regarding HHOT secondary to decreased fatigue. Pt remaining seated in wheelchair with B UE's elevated and wife present in room. Call bell and all needed items within reach upon exiting the room.   Therapy Documentation Precautions:  Precautions Precautions: Fall Restrictions Weight Bearing Restrictions: No Vital Signs: Therapy Vitals Temp: 98 F (36.7 C) Temp Source: Oral Pulse Rate: 89 Resp: 17 BP: (!) 89/53 mmHg Patient Position (if  appropriate): Lying Oxygen Therapy SpO2: 98 % O2 Device: Not Delivered  See Function Navigator for Current Functional Status.   Therapy/Group: Individual Therapy  [name] 12/22/2015, 7:47 AM

## 2015-12-22 NOTE — Progress Notes (Signed)
[facility] PHYSICAL MEDICINE & REHABILITATION     PROGRESS NOTE  Subjective/Complaints:  Patient sitting up in his chair eating breakfast, working with OT. His questions about the x-ray of his heel. He notes persistent edema in his right upper extremity.  ROS: +RUE weeping. Denies CP, SOB, N/V/D.   Objective: Vital Signs: Blood pressure 89/53, pulse 92, temperature 98 F (36.7 C), temperature source Oral, resp. rate 17, height 5\' 7"  (1.702 m), weight 48.9 kg (107 lb 12.9 oz), SpO2 98 %. Dg Foot 2 Views Left  12/20/2015  CLINICAL DATA:  Patient with hindfoot and heel pain status post fall 1 week prior. Initial encounter. EXAM: LEFT FOOT - 2 VIEW COMPARISON:  None. FINDINGS: Limited exam given patient positioning. The great toe is flexed on the AP view. No definite evidence for acute fracture or dislocation. Vascular calcifications. Midfoot degenerative changes. IMPRESSION: Limited exam.  No acute osseous abnormality. Electronically Signed   By: Lovey Newcomer M.D.   On: 12/20/2015 12:09    Recent Labs  12/21/15 0647  WBC 11.2*  HGB 9.9*  HCT 30.8*  PLT 140*    Recent Labs  12/21/15 0647 12/22/15 0729  NA 130* 134*  K 5.0 6.0*  CL 97* 99*  GLUCOSE 98 98  BUN 47* 51*  CREATININE 1.17 1.25*  CALCIUM 7.6* 8.2*   CBG (last 3)   Recent Labs  12/21/15 1646 12/21/15 2158 12/22/15 0652  GLUCAP 221* 184* 95    Wt Readings from Last 3 Encounters:  12/22/15 48.9 kg (107 lb 12.9 oz)  12/13/15 50 kg (110 lb 3.7 oz)  12/11/15 52.935 kg (116 lb 11.2 oz)    Physical Exam:  BP 89/53 mmHg  Pulse 92  Temp(Src) 98 F (36.7 C) (Oral)  Resp 17  Ht 5\' 7"  (1.702 m)  Wt 48.9 kg (107 lb 12.9 oz)  BMI 16.88 kg/m2  SpO2 98% Gen: NAD. Vital signs reviewed.  HENT: Bruising to left orbital area and side of face (improving) Eyes: EOM are normal.  Cardiovascular: Cardiac rate controlled  Respiratory: Effort normal and breath sounds normal. No respiratory distress.  GI: Soft. Bowel sounds  are normal. He exhibits no distension.  MSK: +Edema in Right upper extremity.   Neurological: He is alert and oriented.  Follows commands.  B/l UE grossly 4+/5 prox to distal.  B/l LE: 4-/5 HF, KE 4-/5, ADF/PF 4/5.   Skin: Skin is warm and dry.  Numerous bruises on his arms and legs.   Weeping from right proximal forearm  Finger tips improved, no longer cyonotic Psychiatric: He has a normal mood and affect. His behavior is normal.   Assessment/Plan: 1. Functional deficits secondary to GI bleed status post epi injection and cauterization/multi-medical which require 3+ hours per day of interdisciplinary therapy in a comprehensive inpatient rehab setting. Physiatrist is providing close team supervision and 24 hour management of active medical problems listed below. Physiatrist and rehab team continue to assess barriers to discharge/monitor patient progress toward functional and medical goals.  Function:  Bathing Bathing position Bathing activity did not occur: Refused Position: Wheelchair/chair at sink  Bathing parts Body parts bathed by patient: Right arm, Left arm, Chest, Abdomen, Front perineal area, Buttocks, Right upper leg, Left upper leg, Right lower leg, Left lower leg Body parts bathed by helper: Back  Bathing assist Assist Level: Supervision or verbal cues      Upper Body Dressing/Undressing Upper body dressing   What is the patient wearing?: Pull over shirt/dress  Pull over shirt/dress - Perfomed by patient: Thread/unthread right sleeve, Thread/unthread left sleeve, Put head through opening, Pull shirt over trunk Pull over shirt/dress - Perfomed by helper: Pull shirt over trunk        Upper body assist Assist Level: More than reasonable time   Set up : To obtain clothing/put away  Lower Body Dressing/Undressing Lower body dressing   What is the patient wearing?: Pants     Pants- Performed by patient: Thread/unthread right pants leg, Thread/unthread left pants  leg, Pull pants up/down Pants- Performed by helper: Thread/unthread right pants leg, Thread/unthread left pants leg   Non-skid slipper socks- Performed by helper: Don/doff right sock, Don/doff left sock                  Lower body assist Assist for lower body dressing: Supervision or verbal cues      Toileting Toileting   Toileting steps completed by patient: Adjust clothing prior to toileting, Performs perineal hygiene, Adjust clothing after toileting Toileting steps completed by helper: Adjust clothing prior to toileting, Performs perineal hygiene, Adjust clothing after toileting Toileting Assistive Devices: Grab bar or rail  Toileting assist Assist level: Supervision or verbal cues   Transfers Chair/bed transfer   Chair/bed transfer method: Stand pivot Chair/bed transfer assist level: Supervision or verbal cues Chair/bed transfer assistive device: Armrests, Medical sales representative     Max distance: 100 Assist level: Supervision or verbal cues   Wheelchair   Type: Manual Max wheelchair distance: 157 Assist Level: Supervision or verbal cues  Cognition Comprehension Comprehension assist level: Follows basic conversation/direction with extra time/assistive device  Expression Expression assist level: Expresses complex ideas: With extra time/assistive device  Social Interaction Social Interaction assist level: Interacts appropriately with others with medication or extra time (anti-anxiety, antidepressant).  Problem Solving Problem solving assist level: Solves basic problems with no assist  Memory Memory assist level: Recognizes or recalls 75 - 89% of the time/requires cueing 10 - 24% of the time    Medical Problem List and Plan: 1. Debilitation and resolving encephalopathy secondary to GI bleed status post epi injection and cauterization/multi-medical  -Continue CIR 2. DVT Prophylaxis/Anticoagulation: Superficial thrombus right basilic vein and left cephalic  vein.   No anticoagulation at this time due to GI bleed.   Repeat Doppler Ordered on 5/8, pending. 3. Pain Management: Low-dose oxycodone 5 mg every 4 hours as needed. Monitor mental status 4. Seizure prophylaxis. Depakote 750 mg every 12 hours. 5. Neuropsych: This patient is capable of making decisions on his own behalf. 6. Skin/Wound Care: Routine skin checks, monitoring of scattered abrasions 7. Fluids/Electrolytes/Nutrition: Routine I&O's   Hyperkalemia: 6.0 on 5/9   Will order labs for tomorrow 8. CAD status post CABG. No chest pain or shortness of breath. 9. History of atrial fibrillation.   Coumadin remains on hold due to GI bleed consider resuming in 2 weeks if no further bleeding versus aspirin (?~5/9, will continue to hold and await results of Dopplers).   Continue Lanoxin 0.0625 mg daily 10. Systolic diastolic congestive heart failure. Monitor for any signs of fluid overload.  Weight stable at around 49 KG's 11. Escherichia coli urinary tract infection. Last day of Rocephin 12/16/2015  12. Gout. Zyloprim 300 mg daily 13. Hyperlipidemia. Pravachol 14. Hypotension:  Likely exacerbated by third spacing, protein added on 5/4  Will encourage fluid intake 15. Prediabetes  Will cont to monitor 16. Dysphagia  Will advance diet as tolerated and transition to carb controlled,  low potassium diet 17. Acute blood loss anemia  Hb 9.9 on 5/8  Will cont to monitor 18. Leukocytosis  WBCs 11.2 on 5/8  Ucx no growth from 5/2  Repeat chest x-ray 5/4 reviewed, relatively unremarkable  Will cont to monitor 19. CKD  Cr  1.25 on 5/9  Will cont to monitor  Will order labs for tomorrow 20. Peripheral edema  Dependent edema bilateral upper extremities, right greater than left  Encourage patient to elevate upper extremities, educated patient on what this means  Absorption dressing in place  Lasix IV x1 ordered on 5/6  21. Cyanotic distal extremities,?Raynaud's: Improved at  present  vascular consultation, appreciate consult  Vascular arterial ultrasound ordered, which were relatively unremarkable  Patient blood pressure unable to tolerate CCB this time 22. Left heel pain  X-ray from 5/7 reviewed, relatively unremarkable  Possible mild Achilles tendinopathy  LOS (Days) 7 A FACE TO FACE EVALUATION WAS PERFORMED  Ankit Lorie Phenix 12/22/2015 8:54 AM

## 2015-12-22 NOTE — Progress Notes (Signed)
Vascular called with results from [place]. DVT noted in R upper extremity. PA made aware. Unable to restart Coumadin at this time. Will possibly resume tomorrow. For now, continue to monitor. Will report to oncoming RN.

## 2015-12-23 ENCOUNTER — Inpatient Hospital Stay (HOSPITAL_COMMUNITY): Payer: Medicare Other | Admitting: Physical Therapy

## 2015-12-23 ENCOUNTER — Inpatient Hospital Stay (HOSPITAL_COMMUNITY): Payer: Medicare Other | Admitting: Occupational Therapy

## 2015-12-23 ENCOUNTER — Inpatient Hospital Stay (HOSPITAL_COMMUNITY): Payer: Medicare Other | Admitting: Speech Pathology

## 2015-12-23 DIAGNOSIS — I82A11 Acute embolism and thrombosis of right axillary vein: Secondary | ICD-10-CM | POA: Insufficient documentation

## 2015-12-23 LAB — GLUCOSE, CAPILLARY
GLUCOSE-CAPILLARY: 193 mg/dL — AB (ref 65–99)
Glucose-Capillary: 92 mg/dL (ref 65–99)
Glucose-Capillary: 109 mg/dL — ABNORMAL HIGH (ref 65–99)
Glucose-Capillary: 198 mg/dL — ABNORMAL HIGH (ref 65–99)

## 2015-12-23 LAB — CBC WITH DIFFERENTIAL/PLATELET
Basophils Absolute: 0 10*3/uL (ref 0.0–0.1)
Basophils Relative: 0 %
Eosinophils Absolute: 0.1 10*3/uL (ref 0.0–0.7)
Eosinophils Relative: 1 %
HEMATOCRIT: 35.7 % — AB (ref 39.0–52.0)
Hemoglobin: 11.5 g/dL — ABNORMAL LOW (ref 13.0–17.0)
LYMPHS PCT: 13 %
Lymphs Abs: 1.7 10*3/uL (ref 0.7–4.0)
MCH: 33 pg (ref 26.0–34.0)
MCHC: 32.2 g/dL (ref 30.0–36.0)
MCV: 102.3 fL — AB (ref 78.0–100.0)
MONO ABS: 1.2 10*3/uL — AB (ref 0.1–1.0)
MONOS PCT: 10 %
NEUTROS ABS: 9.5 10*3/uL — AB (ref 1.7–7.7)
Neutrophils Relative %: 76 %
Platelets: 157 10*3/uL (ref 150–400)
RBC: 3.49 MIL/uL — ABNORMAL LOW (ref 4.22–5.81)
RDW: 20.8 % — AB (ref 11.5–15.5)
WBC: 12.5 10*3/uL — ABNORMAL HIGH (ref 4.0–10.5)

## 2015-12-23 LAB — BASIC METABOLIC PANEL
Anion gap: 11 (ref 5–15)
BUN: 41 mg/dL — AB (ref 6–20)
CHLORIDE: 101 mmol/L (ref 101–111)
CO2: 23 mmol/L (ref 22–32)
CREATININE: 1.18 mg/dL (ref 0.61–1.24)
Calcium: 7.4 mg/dL — ABNORMAL LOW (ref 8.9–10.3)
GFR calc Af Amer: >60 mL/min (ref >60)
GFR calc non Af Amer: 53 mL/min — ABNORMAL LOW (ref >60)
Glucose, Bld: 92 mg/dL (ref 65–99)
Potassium: 4.5 mmol/L (ref 3.5–5.1)
Sodium: 135 mmol/L (ref 135–145)

## 2015-12-23 LAB — DIGOXIN LEVEL: Digoxin Level: 0.6 ng/mL — ABNORMAL LOW (ref 0.8–2.0)

## 2015-12-23 MED ORDER — RIVAROXABAN 15 MG PO TABS
15.0000 mg | ORAL_TABLET | Freq: Every day | ORAL | Status: DC
  Administered 2015-12-23 – 2015-12-24 (×2): 15 mg via ORAL
  Filled 2015-12-23 (×2): qty 1

## 2015-12-23 NOTE — Progress Notes (Signed)
Physical Therapy Discharge Summary  Patient Details  Name: [name] MRN: [id number] Date of Birth: 01/12/1925  Today's Date: 12/23/2015 PT Individual Time: 1002-1100 PT Individual Time Calculation (min): 58 min    Patient has met 9 of 9 long term goals due to improved activity tolerance and improved balance.  Patient to discharge at an ambulatory level Modified Independent, requiring supervision only for community mobility and stair negotiation.   Patient's care partner is independent to provide the necessary physical supervision and cognitive assistance at discharge.   Recommendation:  Patient will benefit from ongoing skilled PT services in home health setting to continue to advance safe functional mobility, address ongoing impairments in activity tolerance and balance, and minimize fall risk.  Equipment: RW  Reasons for discharge: treatment goals met  Patient/family agrees with progress made and goals achieved: Yes   Skilled PT Intervention: Pt received resting in bed, no c/o pain and agreeable to therapy session.    Pt demonstrates bed mobility and transfers throughout session with RW and mod I.  Pt requires more than a reasonable amount of time for all mobility 2/2 decreased endurance and question self limiting behavior.    Gait training in community setting with close supervision and verbal cues. PT instructed pt in stair negotiation x4 steps with 2 handrails and supervision with verbal cues for sequencing of LE advancement.  Gait training in controlled environment with mod I, pt required several short standing rest breaks 2/2 LE pain.  RN notified.    Pt performs car transfer mod I with RW.   Pt returned to room at end of session and positioned upright in w/c with call bell in reach and needs met.    PT Discharge Precautions/Restrictions Precautions Precautions: Fall Restrictions Weight Bearing Restrictions: No Vital Signs   Pain Pain Assessment Pain Assessment:  0-10 Pain Score: 10-Worst pain ever Pain Location: Leg Pain Orientation: Left Vision/Perception     Cognition Overall Cognitive Status: Within Functional Limits for tasks assessed Arousal/Alertness: Awake/alert Orientation Level: Oriented X4 Safety/Judgment: Appears intact Sensation Sensation Light Touch: Appears Intact Stereognosis: Not tested Hot/Cold: Appears Intact Proprioception: Appears Intact Coordination Gross Motor Movements are Fluid and Coordinated: Yes Fine Motor Movements are Fluid and Coordinated: Yes Motor  Motor Motor: Within Functional Limits Motor - Discharge Observations: Pt with improving strength and endurance compared to time of eval, but continues to demonstrate endurance deficits and self limiting behavior  Mobility Bed Mobility Bed Mobility: Supine to Sit;Sit to Supine Supine to Sit: 6: Modified independent (Device/Increase time) Sit to Supine: 6: Modified independent (Device/Increase time) Transfers Transfers: Yes Sit to Stand: 6: Modified independent (Device/Increase time) Stand to Sit: 6: Modified independent (Device/Increase time) Locomotion  Ambulation Ambulation: Yes Ambulation/Gait Assistance: 6: Modified independent (Device/Increase time) Assistive device: Rolling walker Gait Gait: Yes Gait Pattern: Decreased stride length;Trunk flexed Gait velocity: decreased for age and gender norms Stairs / Additional Locomotion Stairs: Yes Stairs Assistance: 5: Supervision Stairs Assistance Details: Verbal cues for gait pattern Stair Management Technique: Two rails Number of Stairs: 4 Height of Stairs: 6 Wheelchair Mobility Wheelchair Mobility: No  Trunk/Postural Assessment  Cervical Assessment Cervical Assessment: Within Functional Limits Thoracic Assessment Thoracic Assessment: Within Functional Limits Lumbar Assessment Lumbar Assessment: Within Functional Limits Postural Control Postural Control: Within Functional Limits   Balance Balance Balance Assessed: Yes Static Standing Balance Static Standing - Balance Support: Bilateral upper extremity supported Static Standing - Level of Assistance: 6: Modified independent (Device/Increase time) Dynamic Standing Balance Dynamic Standing - Balance Support:  During functional activity;Bilateral upper extremity supported Dynamic Standing - Level of Assistance: 6: Modified independent (Device/Increase time) Extremity Assessment  RUE Assessment RUE Assessment: Within Functional Limits LUE Assessment LUE Assessment: Within Functional Limits RLE Assessment RLE Assessment: Exceptions to [facility] RLE AROM (degrees) RLE Overall AROM Comments: WFL at hip and knee assessed in sitting RLE Strength Right Hip Flexion: 3+/5 Right Knee Flexion: 4+/5 Right Knee Extension: 4+/5 Right Ankle Dorsiflexion: 4+/5 Right Ankle Plantar Flexion: 4+/5 LLE Assessment LLE Assessment: Exceptions to WFL LLE AROM (degrees) LLE Overall AROM Comments: WFL at hip and knee assessed in sitting LLE Strength Left Hip Flexion: 3+/5 Left Knee Flexion: 4+/5 Left Knee Extension: 4+/5 Left Ankle Dorsiflexion: 4/5   See Function Navigator for Current Functional Status.  Brittian Renaldo E Penven-Crew 12/23/2015, 10:40 AM

## 2015-12-23 NOTE — Discharge Summary (Signed)
Discharge summary job [id number]

## 2015-12-23 NOTE — Progress Notes (Signed)
Speech Language Pathology Daily Session Note  Patient Details  Name: [name] MRN: [id number] Date of Birth: [birth year]  Today's Date: 12/23/2015 SLP Individual Time: [id number] SLP Individual Time Calculation (min): 30 min  Short Term Goals: Week 1: SLP Short Term Goal 1 (Week 1): Patient will demonstrate complex problem solving for familiar tasks with Mod I.  SLP Short Term Goal 2 (Week 1): Patient will recall new, daily information with use of external aids with Mod I.  SLP Short Term Goal 3 (Week 1): Patient will consume current diet with minimal overt s/s of aspiration with Mod I for use of swallowing strategies.   Skilled Therapeutic Interventions: Skilled treatment session focused on cognitive goals. SLP facilitated session by administering the MoCA (version 7.1). Patient scored 23/30 points with a score of 26 or above considered normal. Patient demonstrated impairments in short-term recall, executive functioning and attention. Patient given handout in regards to memory compensatory strategies. Patient left upright in wheelchair with all needs within reach. Continue with current plan of care.    Function:  Cognition Comprehension Comprehension assist level: Understands complex 90% of the time/cues 10% of the time  Expression   Expression assist level: Expresses complex ideas: With extra time/assistive device  Social Interaction Social Interaction assist level: Interacts appropriately with others with medication or extra time (anti-anxiety, antidepressant).  Problem Solving Problem solving assist level: Solves complex problems: With extra time  Memory Memory assist level: Recognizes or recalls 90% of the time/requires cueing < 10% of the time    Pain Reports of pain on buttocks, patient repositioned   Therapy/Group: Individual Therapy  Obryan Radu 12/23/2015, 2:35 PM

## 2015-12-23 NOTE — Progress Notes (Signed)
Occupational Therapy Discharge Summary and OT Intervention  Patient Details  Name: COLBE VIVIANO MRN: [id number] Date of Birth: [birth year]  Today's Date: 12/23/2015 OT Individual Time: 0900-1000 and OT Individual Time Calculation (min): 60 min and   Patient has met 9 of 9 long term goals due to improved activity tolerance, improved balance, postural control and ability to compensate for deficitsPatient to discharge at overall Mod I with supervision for shower transfer level.  His wife has been present during 1 OT session with verbal education provided for discharge. Reasons goals not met: all goals met  Recommendation:  Patient will benefit from ongoing skilled OT services in home health setting to continue to advance functional skills in the area of BADL and iADL.  Equipment: shower chair  Reasons for discharge: treatment goals met  Patient/family agrees with progress made and goals achieved: Yes   OT Intervention:  Session 1: Upon entering the room, pt seated in wheelchair with no c/o pain this session. OT removed B UE dressings for shower. Pt ambulated with mod I and RW to dresser to obtain clothing items. Pt then ambulating into bathroom for toileting at mod I level as well. Pt seated on shower chair in walk in shower for bathing with supervision for safety. Pt performed bathing from shower level independently with increased time. Pt taking rest break before drying and then exiting to dress from EOB. Pt dressed for EOB but with increased fatigue requiring rest breaks. OT recommended pt perform showers at night secondary to level of fatigue with task. Pt returning to supine at the end of session with cal bell and all needed items within reach.  Session 2: Upon entering the room, pt seated in wheelchair and requesting to go to bathroom for BM. Pt ambulated from wheelchair to bathroom with RW and increased time. Pt performed toileting and clothing management without assistance and use of RW.  Pt sitting on commode for 20 minutes and unable to void. Pt returned to wheelchair for B UE strengthening exercies with use of 4 lbs dowel rod. Pt performed 2 sets of 10 reverse rows, straight arm raises, chest press, and bicep curls with rest breaks in between sets secondary to fatigue. Pt returned to bed at end of session with call bell and all needed items within reach.   OT Discharge Precautions/Restrictions  Precautions Precautions: Fall Pain Pain Assessment Pain Assessment: No/denies pain Vision/Perception  Vision- History Baseline Vision/History: Wears glasses Wears Glasses: Reading only Vision- Assessment Vision Assessment?: No apparent visual deficits  Cognition Overall Cognitive Status: Within Functional Limits for tasks assessed Arousal/Alertness: Awake/alert Orientation Level: Oriented X4 Safety/Judgment: Appears intact Sensation Sensation Light Touch: Appears Intact Stereognosis: Not tested Hot/Cold: Appears Intact Proprioception: Appears Intact Coordination Gross Motor Movements are Fluid and Coordinated: Yes Fine Motor Movements are Fluid and Coordinated: Yes Motor  Motor Motor - Discharge Observations: pt continues to have some weakness throughout but is functional with improvement since evaluation Mobility  Bed Mobility Bed Mobility: Supine to Sit;Sit to Supine Supine to Sit: 6: Modified independent (Device/Increase time) Sit to Supine: 6: Modified independent (Device/Increase time) Transfers Sit to Stand: 6: Modified independent (Device/Increase time) Stand to Sit: 6: Modified independent (Device/Increase time)  Trunk/Postural Assessment  Cervical Assessment Cervical Assessment: Within Functional Limits Thoracic Assessment Thoracic Assessment: Within Functional Limits Lumbar Assessment Lumbar Assessment: Within Functional Limits Postural Control Postural Control: Within Functional Limits  Balance Balance Balance Assessed: Yes Static Standing  Balance Static Standing - Balance Support: Bilateral upper extremity supported Static  Standing - Level of Assistance: 6: Modified independent (Device/Increase time) Dynamic Standing Balance Dynamic Standing - Balance Support: During functional activity;Bilateral upper extremity supported Dynamic Standing - Level of Assistance: 6: Modified independent (Device/Increase time) Extremity/Trunk Assessment RUE Assessment RUE Assessment: Within Functional Limits LUE Assessment LUE Assessment: Within Functional Limits   See Function Navigator for Current Functional Status.  [name] 12/23/2015, 9:39 AM

## 2015-12-23 NOTE — Progress Notes (Signed)
[place] [facility]ILIT[facility]     PROGRESS NOTE  Subjective/Complaints:  Patient seen sitting up in bed eating breakfast. He has continued weeping from his right upper extremity, but believes it is getting better. He states he is looking forward to going home and getting something he has never had before, a manicure/pedicure.  ROS: +RUE weeping. Denies CP, SOB, N/V/D.   Objective: Vital Signs: Blood pressure 98/60, pulse 81, temperature 98 F (36.7 C), temperature source Oral, resp. rate 18, height 5\' 7"  (1.702 m), weight 48.7 kg (107 lb 5.8 oz), SpO2 99 %. No results found.  Recent Labs  12/21/15 0647  WBC 11.2*  HGB 9.9*  HCT 30.8*  PLT 140*    Recent Labs  12/22/15 0729 12/23/15 0649  NA 134* 135  K 6.0* 4.5  CL 99* 101  GLUCOSE 98 92  BUN 51* 41*  CREATININE 1.25* 1.18  CALCIUM 8.2* 7.4*   CBG (last 3)   Recent Labs  12/22/15 1633 12/22/15 2128 12/23/15 0643  GLUCAP 139* 138* 92    Wt Readings from Last 3 Encounters:  12/23/15 48.7 kg (107 lb 5.8 oz)  12/13/15 50 kg (110 lb 3.7 oz)  12/11/15 52.935 kg (116 lb 11.2 oz)    Physical Exam:  BP 98/60 mmHg  Pulse 81  Temp(Src) 98 F (36.7 C) (Oral)  Resp 18  Ht 5\' 7"  (1.702 m)  Wt 48.7 kg (107 lb 5.8 oz)  BMI 16.81 kg/m2  SpO2 99% Gen: NAD. Vital signs reviewed.  HENT: Bruising to left orbital area and side of face (improving) Eyes: EOM are normal.  Cardiovascular: Cardiac rate controlled  Respiratory: Effort normal and breath sounds normal. No respiratory distress.  GI: Soft. Bowel sounds are normal. He exhibits no distension.  MSK: +Edema in Right upper extremity.   Neurological: He is alert and oriented.  Follows commands.  B/l UE grossly 4+/5 prox to distal.  B/l LE: 4-/5 HF, KE 4/5, ADF/PF 4/5.   Skin: Skin is warm and dry.  Numerous bruises on his arms and legs.   Weeping from right proximal forearm  Finger tips improved, no longer cyonotic Psychiatric: He has a normal  mood and affect. His behavior is normal.   Assessment/Plan: 1. Functional deficits secondary to GI bleed status post epi injection and cauterization/multi-medical which require 3+ hours per day of interdisciplinary therapy in a comprehensive inpatient rehab setting. Physiatrist is providing close team supervision and 24 hour management of active medical problems listed below. Physiatrist and rehab team continue to assess barriers to discharge/monitor patient progress toward functional and medical goals.  Function:  Bathing Bathing position Bathing activity did not occur: Refused Position: Wheelchair/chair at sink  Bathing parts Body parts bathed by patient: Right arm, Left arm, Chest, Abdomen, Front perineal area, Buttocks, Right upper leg, Left upper leg, Right lower leg, Left lower leg Body parts bathed by helper: Back  Bathing assist Assist Level: Supervision or verbal cues      Upper Body Dressing/Undressing Upper body dressing   What is the patient wearing?: Pull over shirt/dress     Pull over shirt/dress - Perfomed by patient: Thread/unthread right sleeve, Thread/unthread left sleeve, Put head through opening, Pull shirt over trunk Pull over shirt/dress - Perfomed by helper: Pull shirt over trunk        Upper body assist Assist Level: More than reasonable time   Set up : To obtain clothing/put away  Lower Body Dressing/Undressing Lower body dressing   What  is the patient wearing?: Hospital Gown, Non-skid slipper socks     Pants- Performed by patient: Thread/unthread right pants leg, Thread/unthread left pants leg, Pull pants up/down Pants- Performed by helper: Pull pants up/down, Thread/unthread right pants leg, Thread/unthread left pants leg   Non-skid slipper socks- Performed by helper: Don/doff right sock, Don/doff left sock                  Lower body assist Assist for lower body dressing: 2 Helpers      Toileting Toileting   Toileting steps completed by  patient: Adjust clothing prior to toileting, Performs perineal hygiene, Adjust clothing after toileting Toileting steps completed by helper: Adjust clothing prior to toileting, Performs perineal hygiene, Adjust clothing after toileting Toileting Assistive Devices:  ([place])  Toileting assist Assist level: Supervision or verbal cues   Transfers Chair/bed transfer   Chair/bed transfer method: Stand pivot, Ambulatory Chair/bed transfer assist level: Supervision or verbal cues Chair/bed transfer assistive device: Armrests, Medical sales representative     Max distance: 150 Assist level: Supervision or verbal cues   Wheelchair   Type: Manual Max wheelchair distance: 157 Assist Level: Supervision or verbal cues  Cognition Comprehension Comprehension assist level: Understands complex 90% of the time/cues 10% of the time  Expression Expression assist level: Expresses complex ideas: With extra time/assistive device  Social Interaction Social Interaction assist level: Interacts appropriately with others with medication or extra time (anti-anxiety, antidepressant).  Problem Solving Problem solving assist level: Solves complex 90% of the time/cues < 10% of the time  Memory Memory assist level: Recognizes or recalls 90% of the time/requires cueing < 10% of the time    Medical Problem List and Plan: 1. Debilitation and resolving encephalopathy secondary to GI bleed status post epi injection and cauterization/multi-medical  -Continue CIR 2. DVT Prophylaxis/Anticoagulation: Superficial thrombus right basilic vein and left cephalic vein.   No anticoagulation at this time due to GI bleed.   Repeat Doppler Ordered on 5/8, Showing extension of right superficial thrombus to DVT, will speak to cardiology regarding anticoagulation recs. 3. Pain Management: Low-dose oxycodone 5 mg every 4 hours as needed. Monitor mental status 4. Seizure prophylaxis. Depakote 750 mg every 12 hours. 5. Neuropsych:  This patient is capable of making decisions on his own behalf. 6. Skin/Wound Care: Routine skin checks, monitoring of scattered abrasions 7. Fluids/Electrolytes/Nutrition: Routine I&O's   Hyperkalemia: 4.5 on 5/10 8. CAD status post CABG. No chest pain or shortness of breath. 9. History of atrial fibrillation.   Coumadin remains on hold due to GI bleed consider resuming in 2 weeks if no further bleeding versus aspirin , will speak to cardiology regarding bridging and medication choice given DVT.   Continue Lanoxin 0.0625 mg daily 10. Systolic diastolic congestive heart failure. Monitor for any signs of fluid overload.  Weight stable at around 49 KG's 11. Escherichia coli urinary tract infection. Last day of Rocephin 12/16/2015  12. Gout. Zyloprim 300 mg daily 13. Hyperlipidemia. Pravachol 14. Hypotension:  Likely exacerbated by third spacing, protein added on 5/4  Will encourage fluid intake 15. Prediabetes  Will cont to monitor 16. Dysphagia  Will advance diet as tolerated and transition to carb controlled, low potassium diet 17. Acute blood loss anemia  Hb 9.9 on 5/8  Labs pending 18. Leukocytosis  WBCs 11.2 on 5/8  Ucx no growth from 5/2  Repeat chest x-ray 5/4 reviewed, relatively unremarkable  Labs pending 19. CKD  Cr  1.18 on 5/10  Will cont to monitor 20. Peripheral edema  Dependent edema bilateral upper extremities, right greater than left, exacerbated by DVTs  Encourage patient to elevate upper extremities, educated patient on what this means  Absorption dressing in place  Lasix IV x1 ordered on 5/6  21. Cyanotic distal extremities,?Raynaud's: Improved at present  vascular consultation, appreciate consult  Vascular arterial ultrasound ordered, which were relatively unremarkable  Patient blood pressure unable to tolerate CCB this time 22. Left heel pain  X-ray from 5/7 reviewed, relatively unremarkable  Possible mild Achilles tendinopathy  LOS (Days) 8 A FACE TO  FACE EVALUATION WAS PERFORMED  Cristofer Yaffe Lorie Phenix 12/23/2015 9:32 AM

## 2015-12-23 NOTE — Progress Notes (Signed)
Patient ID: CLEOPHUS WILLS, male   DOB: July 06, 1925, 82 y.o.   MRN: [id number]    Patient Name: [name] Date of Encounter: 12/23/2015     Principal Problem:   Debilitated Active Problems:   CKD (chronic kidney disease), stage III   Acute encephalopathy   Chronic combined systolic and diastolic congestive heart failure (HCC)   Hyperkalemia   PAF (paroxysmal atrial fibrillation) (HCC)   History of GI bleed   E. coli UTI   Prediabetes   Dysphagia   Acute blood loss anemia   Leukocytosis   Ischemia of finger   Peripheral edema   Heel pain   Acute deep vein thrombosis (DVT) of axillary vein of right upper extremity (HCC)    SUBJECTIVE  No chest pain. C/o fatigue and leg numbness.  CURRENT MEDS . allopurinol  300 mg Oral Daily  . digoxin  0.0625 mg Oral Daily  . divalproex  750 mg Oral Q12H  . feeding supplement (ENSURE ENLIVE)  237 mL Oral BID BM  . loratadine  10 mg Oral Daily  . pravastatin  20 mg Oral q1800  . protein supplement  1 scoop Oral TID WC    OBJECTIVE  Filed Vitals:   12/22/15 0536 12/22/15 0809 12/22/15 1610 12/23/15 0448  BP: 89/53   98/60  Pulse: 89 92  81  Temp: 98 F (36.7 C)   98 F (36.7 C)  TempSrc: Oral   Oral  Resp: 17   18  Height:      Weight: 107 lb 12.9 oz (48.9 kg)  110 lb 0.2 oz (49.9 kg) 107 lb 5.8 oz (48.7 kg)  SpO2: 98%   99%    Intake/Output Summary (Last 24 hours) at 12/23/15 1329 Last data filed at 12/23/15 0700  Gross per 24 hour  Intake    600 ml  Output    450 ml  Net    150 ml   Filed Weights   12/22/15 0536 12/22/15 1610 12/23/15 0448  Weight: 107 lb 12.9 oz (48.9 kg) 110 lb 0.2 oz (49.9 kg) 107 lb 5.8 oz (48.7 kg)    PHYSICAL EXAM  General: Pleasant, elderly man, frail, NAD. Neuro: Alert and oriented X 3. Moves all extremities spontaneously. Psych: Normal affect. HEENT:  Normal  Neck: Supple without bruits or JVD. Lungs:  Resp regular and unlabored, CTA. Heart: RRR no s3, s4, or murmurs. Abdomen: Soft,  non-tender, non-distended, BS + x 4.  Extremities: No clubbing, cyanosis or edema. DP/PT/Radials 2+ and equal bilaterally.  Accessory Clinical Findings  CBC  Recent Labs  12/21/15 0647 12/23/15 1226  WBC 11.2* 12.5*  NEUTROABS 8.0* 9.5*  HGB 9.9* 11.5*  HCT 30.8* 35.7*  MCV 97.8 102.3*  PLT 140* [id number]   Basic Metabolic Panel  Recent Labs  12/22/15 0729 12/23/15 0649  NA 134* 135  K 6.0* 4.5  CL 99* 101  CO2 22 23  GLUCOSE 98 92  BUN 51* 41*  CREATININE 1.25* 1.18  CALCIUM 8.2* 7.4*   Liver Function Tests No results for input(s): AST, ALT, ALKPHOS, BILITOT, PROT, ALBUMIN in the last 72 hours. No results for input(s): LIPASE, AMYLASE in the last 72 hours. Cardiac Enzymes No results for input(s): CKTOTAL, CKMB, CKMBINDEX, TROPONINI in the last 72 hours. BNP Invalid input(s): POCBNP D-Dimer No results for input(s): DDIMER in the last 72 hours. Hemoglobin A1C No results for input(s): HGBA1C in the last 72 hours. Fasting Lipid Panel No results for input(s): CHOL, HDL, LDLCALC,  TRIG, CHOLHDL, LDLDIRECT in the last 72 hours. Thyroid Function Tests No results for input(s): TSH, T4TOTAL, T3FREE, THYROIDAB in the last 72 hours.  Invalid input(s): FREET3  Radiology/Studies  Ct Abdomen Pelvis Wo Contrast  11/27/2015  CLINICAL DATA:  Acute onset of urinary retention and constipation. Rectal burning. Initial encounter. EXAM: CT ABDOMEN AND PELVIS WITHOUT CONTRAST TECHNIQUE: Multidetector CT imaging of the abdomen and pelvis was performed following the standard protocol without IV contrast. COMPARISON:  CT of the abdomen and pelvis from 10/15/2015 FINDINGS: A trace right-sided pleural effusion is noted, with mild right basilar atelectasis. Pacemaker leads are partially imaged. The liver and spleen are unremarkable in appearance. The gallbladder is within normal limits. The pancreas and adrenal glands are unremarkable. Scattered right renal cysts are seen, one of which demonstrates  some peripheral calcification. Nonspecific perinephric stranding is noted bilaterally. There is no evidence of hydronephrosis. No renal or ureteral stones are identified. No free fluid is identified. The small bowel is unremarkable in appearance. The stomach is within normal limits. No acute vascular abnormalities are seen. Scattered calcification is seen along the abdominal aorta and its branches, particularly prominent along the common iliac arteries bilaterally. The appendix is normal in caliber, without evidence of appendicitis. Scattered diverticulosis is noted along the descending and sigmoid colon, without evidence of diverticulitis. The rectum is largely filled with stool, measuring 7.0 cm in transverse dimension. The bladder is mildly distended, with a Foley catheter in place. The prostate remains normal in size. No inguinal lymphadenopathy is seen. No acute osseous abnormalities are identified. Chronic bilateral pars defects are seen at L5. IMPRESSION: 1. Rectum largely filled with stool, measuring 7.0 cm in transverse dimension, raising concern for mild fecal impaction. Would correlate with the patient's symptoms. The remainder of the colon is largely filled with stool. 2. Scattered diverticulosis along the descending and sigmoid colon, without evidence of diverticulitis. 3. Scattered right renal cysts, one of which demonstrates some peripheral calcification. Nonspecific bilateral perinephric stranding is grossly stable. 4. Previously noted ascites has resolved. 5. Residual trace right pleural effusion, with mild right basilar atelectasis. 6. Scattered calcification along the abdominal aorta and its branches, particularly prominent along the common iliac arteries bilaterally. 7. Chronic bilateral pars defects at L5, without evidence of anterolisthesis. Electronically Signed   By: Garald Balding M.D.   On: 11/27/2015 21:56   Ct Angio Head W/cm &/or Wo Cm  12/13/2015  CLINICAL DATA:  Altered mental  status since 1400 hours, combative and agitated. History of stroke, atrial fibrillation, hypertension, hyperlipidemia, diabetes, bladder cancer. EXAM: CT ANGIOGRAPHY HEAD AND NECK TECHNIQUE: Multidetector CT imaging of the head and neck was performed using the standard protocol during bolus administration of intravenous contrast. Multiplanar CT image reconstructions and MIPs were obtained to evaluate the vascular anatomy. Carotid stenosis measurements (when applicable) are obtained utilizing NASCET criteria, using the distal internal carotid diameter as the denominator. CONTRAST:  50 cc Isovue 370 COMPARISON:  CT HEAD December 11, 2015 FINDINGS: CT HEAD INTRACRANIAL CONTENTS: The ventricles and sulci are normal for age. No intraparenchymal hemorrhage, mass effect nor midline shift. Small area RIGHT frontal encephalomalacia. Patchy supratentorial white matter hypodensities are within normal range for patient's age and though non-specific likely represent chronic small vessel ischemic disease. No acute large vascular territory infarcts. No abnormal extra-axial fluid collections. Basal cisterns are patent. Moderate calcific atherosclerosis of the carotid siphons. No abnormal intracranial enhancement. ORBITS: The included ocular globes and orbital contents are non-suspicious. Status post bilateral ocular lens implants.  SINUSES: Soft tissue opacification LEFT maxillary sinus without expansion. Mastoid air cells are well aerated. SKULL/SOFT TISSUES: No skull fracture. No significant soft tissue swelling. CTA NECK Aortic arch: Normal appearance of the thoracic arch, normal branch pattern. Mild calcific atherosclerosis of the aortic arch. The origins of the innominate, left Common carotid artery and subclavian artery are widely patent. Right carotid system: Common carotid artery is widely patent, coursing in a straight line fashion. Normal appearance of the carotid bifurcation without hemodynamically significant stenosis by  NASCET criteria. Mild eccentric calcific atherosclerosis. Normal appearance of the included internal carotid artery. Left carotid system: Common carotid artery is widely patent, coursing in a straight line fashion. Calcific atherosclerosis and intimal thickening with occluded LEFT internal carotid artery from the origin through the intracranial segments. High-grade stenosis LEFT external carotid artery origin. Vertebral arteries:Left vertebral artery is dominant. Moderate stenosis bilateral vertebral artery origins. Vessels are otherwise widely patent. Skeleton: No acute osseous process though bone windows have not been submitted. Status post median sternotomy. Grade 1 C3-4 anterolisthesis and grade 1 C4-5 retrolisthesis on degenerative basis. Moderate to severe C3-4, C4-5 and C5-6 degenerative disc, severe at C6-7 resulting in moderate to severe RIGHT C3-4, LEFT C4-5, RIGHT C5-6 neural foraminal narrowing. Other neck: Soft tissues of the neck are non-acute though, not tailored for evaluation. Cardiac pacer wires in RIGHT chest. CTA HEAD Anterior circulation: LEFT internal carotid artery occlusion to the level of the carotid terminus were there is a retrograde flow via robust LEFT posterior and anterior communicating arteries. Mild calcific atherosclerosis with widely patent RIGHT internal carotid artery. Supernumerary anterior cerebral artery arising from LEFT A1-2 junction. Widely patent anterior and middle cerebral arteries. Posterior circulation: Normal appearance of the vertebral arteries, vertebrobasilar junction and basilar artery, as well as main branch vessels. Bilateral posterior communicating arteries contribute to posterior circulation. Normal appearance of the posterior cerebral arteries. No hemodynamically significant stenosis, dissection, contrast extravasation or aneurysm within the anterior nor posterior circulation. Mild general luminal irregularity of the intracranial vessels compatible with  atherosclerosis. IMPRESSION: CT HEAD: No acute intracranial process. No abnormal intracranial enhancement. 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 Otherwise negative CT HEAD for age. 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 Moderate stenosis bilateral vertebral artery origins which are otherwise widely patent. CTA HEAD: Retrograde flow to LEFT carotid terminus with complete circle of Willis. No emergent large vessel occlusion or high-grade stenosis. Mild luminal irregularity of the intracranial vessels compatible with atherosclerosis. Electronically Signed   By: Elon Alas M.D.   On: 12/13/2015 02:17   Dg Chest 1 View  12/17/2015  CLINICAL DATA:  Patient with history of leukocytosis. EXAM: CHEST 1 VIEW COMPARISON:  Chest radiograph 12/11/2015. FINDINGS: Stable cardiac and mediastinal contours status post median sternotomy. Multi lead pacer apparatus overlies right hemi thorax, leads are stable in position. No consolidative pulmonary opacities. No pleural effusion or pneumothorax. Old left mid clavicle fracture. IMPRESSION: No acute cardiopulmonary process. Electronically Signed   By: Lovey Newcomer M.D.   On: 12/17/2015 11:43   Ct Head Wo Contrast  12/11/2015  CLINICAL DATA:  Decreased responsiveness beginning 1 hour ago. EXAM: CT HEAD WITHOUT CONTRAST TECHNIQUE: Contiguous axial images were obtained from the base of the skull through the vertex without intravenous contrast. COMPARISON:  11/24/2015 FINDINGS: Skull and Sinuses:Recently characterized incomplete left zygomaticomaxillary complex fractures. No new osseous abnormality. Chronic left maxillary sinusitis with near complete opacification. Visualized orbits: No  acute finding Brain: No evidence of acute infarction, hemorrhage, hydrocephalus, or mass lesion/mass effect. Generalized atrophy. Diffuse chronic microvascular ischemic  gliosis in the cerebral white matter. Chronic lacunar infarct in the right thalamus. Remote small cortical infarct in the high posterior right frontal cortex. IMPRESSION: 1. Stable since prior.  No acute intracranial finding. 2. Nondisplaced left facial fractures as described previously. Electronically Signed   By: Monte Fantasia M.D.   On: 12/11/2015 23:33   Ct Head Wo Contrast  11/24/2015  CLINICAL DATA:  82 year old male who fell while walking to the male box. Struck his left head and face on concrete. Loss of consciousness. Headache. On blood thinners. Initial encounter. EXAM: CT HEAD WITHOUT CONTRAST CT MAXILLOFACIAL WITHOUT CONTRAST TECHNIQUE: Multidetector CT imaging of the head and maxillofacial structures were performed using the standard protocol without intravenous contrast. Multiplanar CT image reconstructions of the maxillofacial structures were also generated. COMPARISON:  None available. FINDINGS: CT HEAD FINDINGS Face findings are described below. No scalp hematoma identified. Calvarium intact. Cerebral volume loss. Patchy bilateral cerebral white matter hypodensity. Small chronic appearing lacunar infarcts in the cerebellum and right thalamus. Small area of cortical encephalomalacia in the right MCA territory of the superior right frontal gyrus series 2, image 25. No midline shift, mass effect, or evidence of intracranial mass lesion. No acute intracranial hemorrhage identified. No areas of acute cortically based infarction identified. CT MAXILLOFACIAL FINDINGS Negative visualized nonco Calcified atherosclerosis at the skull base. ntrast brain parenchyma aside from cerebral volume loss. Calcified atherosclerosis at the skull base. No scalp hematoma identified. Postoperative changes to both globes. No acute orbit soft tissue abnormality identified. There is a left face subcutaneous contusion or hematoma on series 4, image 41, at an just below the zygoma. There is mild abnormal retro maxillary  soft tissue density (series 4, image 53). Other visualized noncontrast deep soft tissue spaces of the face remain normal. The mandible remains intact. There is a mildly comminuted fracture through the posterior superior wall of the left maxillary sinus (series 5, image 57). Hyperdensity throughout the left maxillary sinus therefore may be hemorrhage. The other paranasal sinuses remain clear. There is a nondisplaced fracture of the left zygomatic arch (image 54). There is a mildly comminuted fracture of the lateral wall of the left orbit. The left orbital floor appears to remain intact. No nasal bone fracture identified. No contralateral right face fractures identified. Advanced degenerative changes in the visualized cervical spine with multilevel spondylolisthesis. IMPRESSION: 1. Mildly comminuted fractures of the left maxillary sinus posterior wall, left orbit lateral wall, left zygomatic arch. Overlying soft tissue hematoma. Hemorrhage in the left maxillary sinus. 2. No other acute facial fracture. 3. Chronic small and medium-sized vessel ischemia in the brain with no acute intracranial abnormality. Electronically Signed   By: Genevie Ann M.D.   On: 11/24/2015 14:52   Ct Angio Neck W/cm &/or Wo/cm  12/13/2015  CLINICAL DATA:  Altered mental status since 1400 hours, combative and agitated. History of stroke, atrial fibrillation, hypertension, hyperlipidemia, diabetes, bladder cancer. EXAM: CT ANGIOGRAPHY HEAD AND NECK TECHNIQUE: Multidetector CT imaging of the head and neck was performed using the standard protocol during bolus administration of intravenous contrast. Multiplanar CT image reconstructions and MIPs were obtained to evaluate the vascular anatomy. Carotid stenosis measurements (when applicable) are obtained utilizing NASCET criteria, using the distal internal carotid diameter as the denominator. CONTRAST:  50 cc Isovue 370 COMPARISON:  CT HEAD December 11, 2015 FINDINGS: CT HEAD INTRACRANIAL CONTENTS: The  ventricles and  sulci are normal for age. No intraparenchymal hemorrhage, mass effect nor midline shift. Small area RIGHT frontal encephalomalacia. Patchy supratentorial white matter hypodensities are within normal range for patient's age and though non-specific likely represent chronic small vessel ischemic disease. No acute large vascular territory infarcts. No abnormal extra-axial fluid collections. Basal cisterns are patent. Moderate calcific atherosclerosis of the carotid siphons. No abnormal intracranial enhancement. ORBITS: The included ocular globes and orbital contents are non-suspicious. Status post bilateral ocular lens implants. SINUSES: Soft tissue opacification LEFT maxillary sinus without expansion. Mastoid air cells are well aerated. SKULL/SOFT TISSUES: No skull fracture. No significant soft tissue swelling. CTA NECK Aortic arch: Normal appearance of the thoracic arch, normal branch pattern. Mild calcific atherosclerosis of the aortic arch. The origins of the innominate, left Common carotid artery and subclavian artery are widely patent. Right carotid system: Common carotid artery is widely patent, coursing in a straight line fashion. Normal appearance of the carotid bifurcation without hemodynamically significant stenosis by NASCET criteria. Mild eccentric calcific atherosclerosis. Normal appearance of the included internal carotid artery. Left carotid system: Common carotid artery is widely patent, coursing in a straight line fashion. Calcific atherosclerosis and intimal thickening with occluded LEFT internal carotid artery from the origin through the intracranial segments. High-grade stenosis LEFT external carotid artery origin. Vertebral arteries:Left vertebral artery is dominant. Moderate stenosis bilateral vertebral artery origins. Vessels are otherwise widely patent. Skeleton: No acute osseous process though bone windows have not been submitted. Status post median sternotomy. Grade 1 C3-4  anterolisthesis and grade 1 C4-5 retrolisthesis on degenerative basis. Moderate to severe C3-4, C4-5 and C5-6 degenerative disc, severe at C6-7 resulting in moderate to severe RIGHT C3-4, LEFT C4-5, RIGHT C5-6 neural foraminal narrowing. Other neck: Soft tissues of the neck are non-acute though, not tailored for evaluation. Cardiac pacer wires in RIGHT chest. CTA HEAD Anterior circulation: LEFT internal carotid artery occlusion to the level of the carotid terminus were there is a retrograde flow via robust LEFT posterior and anterior communicating arteries. Mild calcific atherosclerosis with widely patent RIGHT internal carotid artery. Supernumerary anterior cerebral artery arising from LEFT A1-2 junction. Widely patent anterior and middle cerebral arteries. Posterior circulation: Normal appearance of the vertebral arteries, vertebrobasilar junction and basilar artery, as well as main branch vessels. Bilateral posterior communicating arteries contribute to posterior circulation. Normal appearance of the posterior cerebral arteries. No hemodynamically significant stenosis, dissection, contrast extravasation or aneurysm within the anterior nor posterior circulation. Mild general luminal irregularity of the intracranial vessels compatible with atherosclerosis. IMPRESSION: CT HEAD: No acute intracranial process. No abnormal intracranial enhancement. 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 Otherwise negative CT HEAD for age. 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 Moderate stenosis bilateral vertebral artery origins which are otherwise widely patent. CTA HEAD: Retrograde flow to LEFT carotid terminus with complete circle of Willis. No emergent large vessel occlusion or high-grade stenosis. Mild luminal irregularity of the intracranial vessels compatible with atherosclerosis. Electronically Signed    By: Elon Alas M.D.   On: 12/13/2015 02:17   Nm Gi Blood Loss  12/05/2015  CLINICAL DATA:  Bright red blood per rectum. Patient actively bleeding. Gastrointestinal bleeding K92.2 (ICD-10-CM) Gastrointestinal hemorrhage, unspecified gastritis, unspecified gastrointestinal hemorrhage type K92.2 (ICD-10-CM) EXAM: NUCLEAR MEDICINE GASTROINTESTINAL BLEEDING SCAN TECHNIQUE: Sequential abdominal images were obtained following intravenous administration of Tc-36m labeled red blood cells. RADIOPHARMACEUTICALS:  25.2 mCi Tc-64m in-vitro labeled red cells. COMPARISON:  CT, 11/27/2015 FINDINGS:  There is a small elongated focus of uptake in the right central pelvis, adjacent to the right dome of the bladder and right common iliac artery, which becomes apparent approximately 20 minutes following the injection of radiotracer, fading later during the examination. Although somewhat equivocal, this suggests a gastro intestinal bleeding site from the sigmoid colon. There is no other evidence of a GI bleeding source. No other abnormal radiotracer accumulation. IMPRESSION: 1. Small area of abnormal radiotracer accumulation in right central pelvis suggesting a GI bleeding source from the sigmoid colon. Electronically Signed   By: Lajean Manes M.D.   On: 12/05/2015 14:51   Dg Chest Port 1 View  12/11/2015  CLINICAL DATA:  82 year old male with decreased responsiveness. EXAM: PORTABLE CHEST 1 VIEW COMPARISON:  Chest x-ray 12/08/2015. FINDINGS: Right pleural effusion. Probable atelectasis in the right lung base. Left lung is clear. No evidence of pulmonary edema. Mild cardiomegaly. Upper mediastinal contours are within normal limits. Atherosclerosis in the thoracic aorta. Right-sided biventricular pacemaker with lead tip projecting over the expected location of the right ventricle and overlying the left ventricle via the coronary sinus and coronary veins. Status post median sternotomy for CABG, including LIMA. IMPRESSION: 1.  Right pleural effusion with probable subsegmental atelectasis in the right lower lobe. 2. Mild cardiomegaly. 3. Atherosclerosis. Electronically Signed   By: Vinnie Langton M.D.   On: 12/11/2015 22:43   Dg Chest Port 1 View  12/08/2015  CLINICAL DATA:  Shortness of breath EXAM: PORTABLE CHEST 1 VIEW COMPARISON:  12/06/2015 FINDINGS: Chronic cardiomegaly. Biventricular pacer from the right with leads in stable position. Status post CABG. New hazy appearance at the right base with costophrenic sulcus blunting. No pneumothorax. Remote mid left clavicle fracture. IMPRESSION: New small right pleural effusion. Electronically Signed   By: Monte Fantasia M.D.   On: 12/08/2015 06:31   Dg Chest Port 1 View  12/06/2015  CLINICAL DATA:  CHF EXAM: PORTABLE CHEST 1 VIEW COMPARISON:  12/04/2015 chest radiograph. FINDINGS: Sternotomy wires appear aligned and intact. Stable configuration of 2 lead right subclavian pacemaker with lead tips overlying the coronary sinus and right ventricle. CABG clips overlie the mediastinum. Stable cardiomediastinal silhouette with normal heart size. No pneumothorax. No pleural effusion. Lungs appear clear, with no acute consolidative airspace disease and no pulmonary edema. IMPRESSION: No active disease. Electronically Signed   By: Ilona Sorrel M.D.   On: 12/06/2015 14:27   Dg Chest Port 1 View  12/04/2015  CLINICAL DATA:  Altered mental status.  GI bleed. EXAM: PORTABLE CHEST 1 VIEW COMPARISON:  11/27/2015 FINDINGS: Cardiac pacemaker. Postoperative changes in the mediastinum. Shallow inspiration. Heart size and pulmonary vascularity are normal. No focal airspace disease or consolidation in the lungs. No blunting of costophrenic angles. No pneumothorax. Old left rib fractures. Old left clavicular fracture. IMPRESSION: No active disease. Electronically Signed   By: Lucienne Capers M.D.   On: 12/04/2015 23:57   Dg Abd Acute W/chest  11/27/2015  CLINICAL DATA:  Burning sensations in the  rectal region and constipation with urine blockage. EXAM: DG ABDOMEN ACUTE W/ 1V CHEST COMPARISON:  Previous examinations. FINDINGS: The heart remains grossly normal in size. Stable post CABG changes and right subclavian pacemaker leads. Clear lungs with normal vascularity. Normal bowel gas pattern without free peritoneal air. Prominent stool throughout the colon. Mild scoliosis. Mild lumbar spine degenerative changes. Atheromatous arterial calcifications. IMPRESSION: No acute abnormality.  Prominent stool. Electronically Signed   By: Claudie Revering M.D.   On: 11/27/2015 18:22  Dg Foot 2 Views Left  12/20/2015  CLINICAL DATA:  Patient with hindfoot and heel pain status post fall 1 week prior. Initial encounter. EXAM: LEFT FOOT - 2 VIEW COMPARISON:  None. FINDINGS: Limited exam given patient positioning. The great toe is flexed on the AP view. No definite evidence for acute fracture or dislocation. Vascular calcifications. Midfoot degenerative changes. IMPRESSION: Limited exam.  No acute osseous abnormality. Electronically Signed   By: Lovey Newcomer M.D.   On: 12/20/2015 12:09   Dg Swallowing Func-speech Pathology  12/17/2015  Objective Swallowing Evaluation: Type of Study: MBS-Modified Barium Swallow Study Patient Details Name: VORIS PIOTROWICZ MRN: [id number] Date of Birth: [birth year] Today's Date: 12/17/2015 Time: SLP Start Time (ACUTE ONLY): 1033-SLP Stop Time (ACUTE ONLY): 1054 SLP Time Calculation (min) (ACUTE ONLY): 21 min Past Medical History: Past Medical History Diagnosis Date . Atrial fibrillation (HCC)    Chronic . Coronary artery disease 2004   Status post CABG with LIMA-LAD, SVG-CFX. Hx of anterior apical infarct. . Renal insufficiency    Chronic . Hypertension  . Hyperlipidemia  . Gout  . CHF (congestive heart failure) (HCC)    with chronic ischemic cardiomypathy. EF of 10-15%.  CHF due to systolic dysfunction. Clinically doing well. [name] GERD (gastroesophageal reflux disease)  . Pacemaker    Medtronic  biventricular pacemaker, serial number [id number] . Type II diabetes mellitus ([place])    "been off my RX for ~ 2 yr" (03/20/2013) . H/O hiatal hernia  . Arthritis    "right thumb" (03/20/2013) . Depression  . Anxiety  . Bladder cancer ([place])    "low grade; grade 1; non-invasive" (03/20/2013) . CKD (chronic kidney disease), stage III  Past Surgical History: Past Surgical History Procedure Laterality Date . Transurethral resection of prostate  1990's; 2004   "twice; injected chemo 1st time; didn't do that 2nd time" (03/20/2013) . Nasal septum surgery   . US echocardiography  04-17-2009   Est EF 10-15% . Cardiovascular stress test  04-17-2009   EF 29% . Coronary artery bypass graft  02/19/2003   LIMA-LAD, SVG-CFX . Carotid endarterectomy Right 1990's   "left side is still  100% blocked" (03/20/2013) . Icd lead removal Left 03/01/2013   Procedure: ICD LEAD REMOVAL;  Surgeon: Evans Lance, MD;  Location: [facility];  Service: Cardiovascular;  Laterality: Left; . Pacemaker lead removal Left 03/01/2013   Procedure: PACEMAKER LEAD REMOVAL;  Surgeon: Evans Lance, MD;  Location: [facility];  Service: Cardiovascular;  Laterality: Left; . Pacemaker insertion N/A 03/01/2013   Procedure: INSERTION PACEMAKER LEAD;  Surgeon: Evans Lance, MD;  Location: [facility];  Service: Cardiovascular;  Laterality: N/A; . Hemorrhoid surgery     "lanced several years ago" (03/20/2013) . Tonsillectomy and adenoidectomy  1933 . Cataract extraction w/ intraocular lens  implant, bilateral Bilateral ~ 2000 . Bi-ventricular pacemaker insertion N/A 03/20/2013   Procedure: BI-VENTRICULAR PACEMAKER INSERTION (CRT-P);  Surgeon: Evans Lance, MD; Medtronic biventricular pacemaker, serial number [id number]; Laterality: Right . Flexible sigmoidoscopy N/A 12/05/2015   Procedure: FLEXIBLE SIGMOIDOSCOPY;  Surgeon: [name], MD;  Location: [facility];  Service: Endoscopy;  Laterality: N/A; HPI: 82 y.o. male with medical history significant for atrial fibrillation on Coumadin, CAD  status post CABG, chronic kidney disease stage III, chronic systolic CHF, depression and anxiety, just discharged to inpatient rehabilitation 12/11/2015 after his hospitalization for GI bleed. Patient transferred from rehabilitation to the telemetry floor 12/12/2015 after he was found to be unresponsive. Subjective: pt alert, confused  Assessment / Plan / Recommendation CHL IP CLINICAL IMPRESSIONS 12/17/2015 Therapy Diagnosis Moderate pharyngeal phase dysphagia Clinical Impression Pt presents with moderate-severe oropharyngeal dysphagia characterized by silent aspiration across all consistencies with diffuse weakness of musculature.  Pt demonstrated silent aspiration with thin, nectar and puree. Aspiration occured secondary to deep laryngeal penetration with thin during the swallow and occurred with other consistencies following the swallow secondary to vallecular and pyriform residue. Increased viscosity resulted in increased residue. Dry swallow was minimally effective to decrease residue. Poor BOT retraction, hyo-laryngeal excursion and epiglottic inversion. Suspect this is not an acute issue given general decline and debility, however this certainly increases pt's risk for poor nutrition and respiratory compromise. Compensatory strategies of chin tuck, effortful swallow and dry swallow were ineffective to eliminate aspiration or clear residue. Pt has difficulty with mastication of solids and has requested liquids only. Given all of the above information, recommend initiation of Full Liquid diet with strict aspiration precautions. OOB with all  meals, small sips/bites (no straws), intermittent cued throat clear and dry swallow, and meds whole with puree. Discussed POC with pt, RN and PA. All partied verbalized understanding.  Impact on safety and function Severe aspiration risk   CHL IP TREATMENT RECOMMENDATION 12/13/2015 Treatment Recommendations Therapy as outlined in treatment plan below   Prognosis 12/17/2015  Prognosis for Safe Diet Advancement Fair Barriers to Reach Goals -- Barriers/Prognosis Comment -- CHL IP DIET RECOMMENDATION 12/17/2015 SLP Diet Recommendations Other (Comment) Liquid Administration via Cup Medication Administration Whole meds with puree Compensations Slow rate;Small sips/bites;Multiple dry swallows after each bite/sip;Clear throat intermittently Postural Changes --   CHL IP OTHER RECOMMENDATIONS 12/17/2015 Recommended Consults -- Oral Care Recommendations Oral care BID Other Recommendations --   CHL IP FOLLOW UP RECOMMENDATIONS 12/14/2015 Follow up Recommendations Inpatient Rehab   CHL IP FREQUENCY AND DURATION 12/13/2015 Speech Therapy Frequency (ACUTE ONLY) min 2x/week Treatment Duration 2 weeks      CHL IP ORAL PHASE 12/17/2015 Oral Phase Impaired Oral - Pudding Teaspoon -- Oral - Pudding Cup -- Oral - Honey Teaspoon -- Oral - Honey Cup -- Oral - Nectar Teaspoon -- Oral - Nectar Cup -- Oral - Nectar Straw -- Oral - Thin Teaspoon -- Oral - Thin Cup -- Oral - Thin Straw -- Oral - Puree -- Oral - Mech Soft -- Oral - Regular -- Oral - Multi-Consistency -- Oral - Pill -- Oral Phase - Comment increased time required for A-P propulsion with all consistencies  CHL IP PHARYNGEAL PHASE 12/17/2015 Pharyngeal Phase Impaired Pharyngeal- Pudding Teaspoon -- Pharyngeal -- Pharyngeal- Pudding Cup -- Pharyngeal -- Pharyngeal- Honey Teaspoon -- Pharyngeal -- Pharyngeal- Honey Cup -- Pharyngeal -- Pharyngeal- Nectar Teaspoon -- Pharyngeal -- Pharyngeal- Nectar Cup -- Pharyngeal -- Pharyngeal- Nectar Straw -- Pharyngeal -- Pharyngeal- Thin Teaspoon -- Pharyngeal -- Pharyngeal- Thin Cup -- Pharyngeal -- Pharyngeal- Thin Straw -- Pharyngeal -- Pharyngeal- Puree -- Pharyngeal -- Pharyngeal- Mechanical Soft -- Pharyngeal -- Pharyngeal- Regular -- Pharyngeal -- Pharyngeal- Multi-consistency -- Pharyngeal -- Pharyngeal- Pill -- Pharyngeal -- Pharyngeal Comment Pt demonstrated silent aspiration with thin, nectar and puree.  Aspiration occured secondary to deep laryngeal penetration with thin during the swallow and occurred with other consistencies following the swallow secondary to vallecular and pyriform residue. Increased viscosity resulted in increased residue. Dry swallow was minimally effective to decrease residue. Poor BOT retraction, hyo-laryngeal excursion and epiglottic inversion.  CHL IP CERVICAL ESOPHAGEAL PHASE 12/17/2015 Cervical Esophageal Phase WFL Pudding Teaspoon -- Pudding Cup -- Honey Teaspoon -- Honey Cup --  Nectar Teaspoon -- Nectar Cup -- Nectar Straw -- Thin Teaspoon -- Thin Cup -- Thin Straw -- Puree -- Mechanical Soft -- Regular -- Multi-consistency -- Pill -- Cervical Esophageal Comment -- No flowsheet data found. Vinetta Bergamo MA, CCC-SLP 12/17/2015, 3:50 PM              Ct Maxillofacial Wo Cm  11/24/2015  CLINICAL DATA:  82 year old male who fell while walking to the male box. Struck his left head and face on concrete. Loss of consciousness. Headache. On blood thinners. Initial encounter. EXAM: CT HEAD WITHOUT CONTRAST CT MAXILLOFACIAL WITHOUT CONTRAST TECHNIQUE: Multidetector CT imaging of the head and maxillofacial structures were performed using the standard protocol without intravenous contrast. Multiplanar CT image reconstructions of the maxillofacial structures were also generated. COMPARISON:  None available. FINDINGS: CT HEAD FINDINGS Face findings are described below. No scalp hematoma identified. Calvarium intact. Cerebral volume loss. Patchy bilateral cerebral white matter hypodensity. Small chronic appearing lacunar infarcts in the cerebellum and right thalamus. Small area of cortical encephalomalacia in the right MCA territory of the superior right frontal gyrus series 2, image 25. No midline shift, mass effect, or evidence of intracranial mass lesion. No acute intracranial hemorrhage identified. No areas of acute cortically based infarction identified. CT MAXILLOFACIAL FINDINGS Negative  visualized nonco Calcified atherosclerosis at the skull base. ntrast brain parenchyma aside from cerebral volume loss. Calcified atherosclerosis at the skull base. No scalp hematoma identified. Postoperative changes to both globes. No acute orbit soft tissue abnormality identified. There is a left face subcutaneous contusion or hematoma on series 4, image 41, at an just below the zygoma. There is mild abnormal retro maxillary soft tissue density (series 4, image 53). Other visualized noncontrast deep soft tissue spaces of the face remain normal. The mandible remains intact. There is a mildly comminuted fracture through the posterior superior wall of the left maxillary sinus (series 5, image 57). Hyperdensity throughout the left maxillary sinus therefore may be hemorrhage. The other paranasal sinuses remain clear. There is a nondisplaced fracture of the left zygomatic arch (image 54). There is a mildly comminuted fracture of the lateral wall of the left orbit. The left orbital floor appears to remain intact. No nasal bone fracture identified. No contralateral right face fractures identified. Advanced degenerative changes in the visualized cervical spine with multilevel spondylolisthesis. IMPRESSION: 1. Mildly comminuted fractures of the left maxillary sinus posterior wall, left orbit lateral wall, left zygomatic arch. Overlying soft tissue hematoma. Hemorrhage in the left maxillary sinus. 2. No other acute facial fracture. 3. Chronic small and medium-sized vessel ischemia in the brain with no acute intracranial abnormality. Electronically Signed   By: Genevie Ann M.D.   On: 11/24/2015 14:52    ASSESSMENT AND PLAN  1. DVT - he is at risk for developing a PE. I would suggest a trial of xarelto although I am concerned about his bleeding risk.  2. GI Bleed - note episode of bleeding, treated with injection 3. Atrial fib - again, he is at risk for both stroke and bleding. I would suggest Xarelto 15 mg daily. IF he has  additional bleeding then he would need to have his Xarelto and all systemic anti-coagulation stopped.  Gregg Taylor,M.D.  12/23/2015 1:29 PM

## 2015-12-24 ENCOUNTER — Encounter (HOSPITAL_COMMUNITY): Payer: Medicare Other | Admitting: Speech Pathology

## 2015-12-24 LAB — BASIC METABOLIC PANEL
Anion gap: 13 (ref 5–15)
BUN: 49 mg/dL — AB (ref 6–20)
CHLORIDE: 94 mmol/L — AB (ref 101–111)
CO2: 26 mmol/L (ref 22–32)
CREATININE: 1.27 mg/dL — AB (ref 0.61–1.24)
Calcium: 7.7 mg/dL — ABNORMAL LOW (ref 8.9–10.3)
GFR calc Af Amer: 56 mL/min — ABNORMAL LOW (ref >60)
GFR calc non Af Amer: 49 mL/min — ABNORMAL LOW (ref >60)
GLUCOSE: 122 mg/dL — AB (ref 65–99)
POTASSIUM: 4.3 mmol/L (ref 3.5–5.1)
SODIUM: 133 mmol/L — AB (ref 135–145)

## 2015-12-24 LAB — GLUCOSE, CAPILLARY: Glucose-Capillary: 110 mg/dL — ABNORMAL HIGH (ref 65–99)

## 2015-12-24 MED ORDER — ALLOPURINOL 300 MG PO TABS: 300.0000 mg | ORAL_TABLET | Freq: Every day | ORAL | Status: DC

## 2015-12-24 MED ORDER — DIVALPROEX SODIUM 250 MG PO DR TAB: 750.0000 mg | DELAYED_RELEASE_TABLET | Freq: Two times a day (BID) | ORAL | Status: AC

## 2015-12-24 MED ORDER — OXYCODONE HCL 5 MG PO TABS: 5.0000 mg | ORAL_TABLET | ORAL | Status: AC | PRN

## 2015-12-24 MED ORDER — LOVASTATIN 20 MG PO TABS: 20.0000 mg | ORAL_TABLET | Freq: Every day | ORAL | Status: DC

## 2015-12-24 MED ORDER — LORATADINE 10 MG PO TABS: 10.0000 mg | ORAL_TABLET | Freq: Every day | ORAL | Status: DC

## 2015-12-24 MED ORDER — SODIUM POLYSTYRENE SULFONATE 15 GM/60ML PO SUSP
30.0000 g | Freq: Once | ORAL | Status: DC
  Filled 2015-12-24: qty 120

## 2015-12-24 MED ORDER — RIVAROXABAN 15 MG PO TABS: 15.0000 mg | ORAL_TABLET | Freq: Every day | ORAL | Status: DC

## 2015-12-24 MED ORDER — DIGOXIN 125 MCG PO TABS: 0.0625 mg | ORAL_TABLET | Freq: Every day | ORAL | Status: AC

## 2015-12-24 MED ORDER — WITCH HAZEL-GLYCERIN EX PADS
MEDICATED_PAD | CUTANEOUS | Status: DC | PRN
  Filled 2015-12-24: qty 100

## 2015-12-24 NOTE — Progress Notes (Signed)
Social Work  Discharge Note  The overall goal for the admission was met for:   Discharge location: Yes - home with wife to provide intermittent support   Length of Stay: Yes - 9 days  Discharge activity level: Yes - modified independent  Home/community participation: Yes  Services provided included: MD, RD, PT, OT, SLP, RN, TR, Pharmacy and [place]: Medicare and Private Insurance: [employer]  Follow-up services arranged: Home Health: RN, PT, OT, ST via [facility], DME: [phone] lightweight w/c with basic cushion, rolling walker and tub seat via [facility] and Patient/Family has no preference for HH/DME agencies  Comments (or additional information):  Patient/Family verbalized understanding of follow-up arrangements: Yes  Individual responsible for coordination of the follow-up plan: pt  Confirmed correct DME delivered: Oberon Hehir 12/24/2015    Amelya Mabry

## 2015-12-24 NOTE — Progress Notes (Signed)
[facility] PHYSICAL MEDICINE & REHABILITATION     PROGRESS NOTE  Subjective/Complaints:  Patient sitting up in bed this morning. He complains of anal burning and states she has a history of hemorrhoids. He is ready to go home.  ROS: +RUE weeping. Denies CP, SOB, N/V/D.   Objective: Vital Signs: Blood pressure 103/49, pulse 89, temperature 98.3 F (36.8 C), temperature source Oral, resp. rate 17, height 5\' 7"  (1.702 m), weight 51.5 kg (113 lb 8.6 oz), SpO2 100 %. No results found.  Recent Labs  12/23/15 1226  WBC 12.5*  HGB 11.5*  HCT 35.7*  PLT 157    Recent Labs  12/23/15 0649 12/24/15 0558  NA 135 133*  K 4.5 4.3  CL 101 94*  GLUCOSE 92 122*  BUN 41* 49*  CREATININE 1.18 1.27*  CALCIUM 7.4* 7.7*   CBG (last 3)   Recent Labs  12/23/15 1658 12/23/15 2048 12/24/15 0639  GLUCAP 198* 193* 110*    Wt Readings from Last 3 Encounters:  12/24/15 51.5 kg (113 lb 8.6 oz)  12/13/15 50 kg (110 lb 3.7 oz)  12/11/15 52.935 kg (116 lb 11.2 oz)    Physical Exam:  BP 103/49 mmHg  Pulse 89  Temp(Src) 98.3 F (36.8 C) (Oral)  Resp 17  Ht 5\' 7"  (1.702 m)  Wt 51.5 kg (113 lb 8.6 oz)  BMI 17.78 kg/m2  SpO2 100% Gen: NAD. Vital signs reviewed.  HENT: Bruising to left orbital area and side of face (improving) Eyes: EOM are normal.  Cardiovascular: Cardiac rate controlled  Respiratory: Effort normal and breath sounds normal. No respiratory distress.  GI: Soft. Bowel sounds are normal. He exhibits no distension.  MSK: +Edema in Right upper extremity greater than left upper extremity.   Neurological: He is alert and oriented.  Follows commands.  B/l UE grossly 4+/5 prox to distal.  RLE: Hip flexion 4+/5, knee extension 4+/5, ankle dorsi/plantar flexion 4+/5 LLE: Hip flexion 4/5, knee extension 4/5, ankle dorsi/plantar flexion 4 -/5.   Skin: Skin is warm and dry.  Numerous bruises on his arms and legs.   Weeping from right proximal forearm  Finger tips improved, no  longer cyonotic Psychiatric: He has a normal mood and affect. His behavior is normal.   Assessment/Plan: 1. Functional deficits secondary to GI bleed status post epi injection and cauterization/multi-medical which require 3+ hours per day of interdisciplinary therapy in a comprehensive inpatient rehab setting. Physiatrist is providing close team supervision and 24 hour management of active medical problems listed below. Physiatrist and rehab team continue to assess barriers to discharge/monitor patient progress toward functional and medical goals.  Function:  Bathing Bathing position Bathing activity did not occur: Refused Position: Production manager parts bathed by patient: Right arm, Left arm, Chest, Abdomen, Front perineal area, Buttocks, Right upper leg, Left upper leg, Right lower leg, Left lower leg Body parts bathed by helper: Back  Bathing assist Assist Level: More than reasonable time      Upper Body Dressing/Undressing Upper body dressing   What is the patient wearing?: Pull over shirt/dress     Pull over shirt/dress - Perfomed by patient: Thread/unthread right sleeve, Thread/unthread left sleeve, Put head through opening, Pull shirt over trunk Pull over shirt/dress - Perfomed by helper: Pull shirt over trunk        Upper body assist Assist Level: More than reasonable time   Set up : To obtain clothing/put away  Lower Body Dressing/Undressing Lower body dressing  What is the patient wearing?: Non-skid slipper socks, Pants     Pants- Performed by patient: Thread/unthread right pants leg, Thread/unthread left pants leg, Pull pants up/down Pants- Performed by helper: Pull pants up/down, Thread/unthread right pants leg, Thread/unthread left pants leg Non-skid slipper socks- Performed by patient: Don/doff right sock, Don/doff left sock Non-skid slipper socks- Performed by helper: Don/doff right sock, Don/doff left sock                  Lower body assist  Assist for lower body dressing: More than reasonable time      Toileting Toileting   Toileting steps completed by patient: Adjust clothing prior to toileting, Performs perineal hygiene, Adjust clothing after toileting Toileting steps completed by helper: Adjust clothing prior to toileting, Performs perineal hygiene, Adjust clothing after toileting Toileting Assistive Devices:  (waljer)  Toileting assist Assist level: More than reasonable time   Transfers Chair/bed transfer   Chair/bed transfer method: Stand pivot, Ambulatory Chair/bed transfer assist level: No Help, no cues, assistive device, takes more than a reasonable amount of time Chair/bed transfer assistive device: Armrests, Medical sales representative     Max distance: 125 Assist level: No help, No cues, assistive device, takes more than a reasonable amount of time   Wheelchair   Type: Manual Max wheelchair distance: 150 Assist Level: No help, No cues, assistive device, takes more than reasonable amount of time  Cognition Comprehension Comprehension assist level: Understands complex 90% of the time/cues 10% of the time  Expression Expression assist level: Expresses complex ideas: With extra time/assistive device  Social Interaction Social Interaction assist level: Interacts appropriately with others with medication or extra time (anti-anxiety, antidepressant).  Problem Solving Problem solving assist level: Solves complex problems: With extra time  Memory Memory assist level: Recognizes or recalls 90% of the time/requires cueing < 10% of the time    Medical Problem List and Plan: 1. Debilitation and resolving encephalopathy secondary to GI bleed status post epi injection and cauterization/multi-medical  -DC today  Will see patient for transitional care management in clinic in 1-2 weeks 2. DVT Prophylaxis/Anticoagulation: Superficial thrombus right basilic vein and left cephalic vein.   No anticoagulation at this  time due to GI bleed.   Repeat Doppler Ordered on 5/8, Showing extension of right superficial thrombus to DVT, spoke to cardiology and started patient on Xarelto at 15 mg.  Educated patient on monitoring for signs and symptoms of bleeding 3. Pain Management: Low-dose oxycodone 5 mg every 4 hours as needed. Monitor mental status 4. Seizure prophylaxis. Depakote 750 mg every 12 hours. 5. Neuropsych: This patient is capable of making decisions on his own behalf. 6. Skin/Wound Care: Routine skin checks, monitoring of scattered abrasions 7. Fluids/Electrolytes/Nutrition: Routine I&O's   Hyperkalemia: 4.5 on 5/10, will order kayexalate prior to d/c 8. CAD status post CABG. No chest pain or shortness of breath. 9. History of atrial fibrillation.   After speaking with cardiology, patient started on Xarelto 15 mg, in light of atrial fibrillation and DVT.   All systemic anticoagulation to be DC'd if recurrence of GI bleed.  Continue Lanoxin 0.0625 mg daily 10. Systolic diastolic congestive heart failure. Monitor for any signs of fluid overload.  Weight stable at around 50 KG's 11. Escherichia coli urinary tract infection. Last day of Rocephin 12/16/2015  12. Gout. Zyloprim 300 mg daily 13. Hyperlipidemia. Pravachol 14. Hypotension:  Likely exacerbated by third spacing, protein added on 5/4  Will encourage fluid intake 15.  Prediabetes  Overall fairly well controlled, however some elevated reading yesterday 16. Dysphagia  Will advance diet as tolerated and transition to carb controlled, low potassium diet 17. Acute blood loss anemia  Hb 11.5 on 5/10 18. Leukocytosis  WBCs 12.5 on 5/10, possibly secondary to DVT  Ucx no growth from 5/2, repeat Urine today  Repeat chest x-ray 5/4 reviewed, relatively unremarkable  Labs pending 19. CKD  Cr  1.27 on 5/11  Will cont to monitor 20. Peripheral edema  Dependent edema bilateral upper extremities, right greater than left, exacerbated by  DVTs  Encourage patient to elevate upper extremities, educated patient on what this means  Absorption dressing in place  Lasix IV x1 ordered on 5/6  21. Cyanotic distal extremities,?Raynaud's: Resolved at present  vascular consultation, appreciate consult  Vascular arterial ultrasound ordered, which were relatively unremarkable  Patient blood pressure unable to tolerate CCB this time 22. Left heel pain: Intermittent  X-ray from 5/7 reviewed, relatively unremarkable  Possible mild Achilles tendinopathy  LOS (Days) 9 A FACE TO FACE EVALUATION WAS PERFORMED  Isma Tietje Lorie Phenix 12/24/2015 8:57 AM

## 2015-12-24 NOTE — Discharge Summary (Signed)
[name], [name] NO.:  [id number]  MEDICAL RECORD NO.:  [id number]  LOCATION:  4W10C                        FACILITY:  [facility]  PHYSICIAN:  Delice Lesch, MD        DATE OF BIRTH:  03-Mar-1925  DATE OF ADMISSION:  12/15/2015 DATE OF DISCHARGE:  12/24/2015                              DISCHARGE SUMMARY   DISCHARGE DIAGNOSES: 1. Debilitation, resolving encephalopathy due to GI bleed. 2. Superficial thrombus right basilic vein and left cephalic vein showing extension of right superficial thrombus, into the axillary and subclavian deep veins on Dec 21, 2015. 3. Pain management. 4. Seizure prophylaxis. 5. Hyperkalemia. 6. Coronary artery disease with CABG. 7. History of atrial fibrillation. 8. Systolic diastolic congestive heart failure. 9. E. coli urinary tract infection, resolved. 10.Gout. 11.Hyperlipidemia. 12.Hypertension. 13.Pre-diabetic. 14.Dysphagia. 15.Acute blood loss anemia. 16.Chronic renal insufficiency. 17.Peripheral edema. 18.Cyanotic distal extremities. 19.Constipation, resolved.  HISTORY OF PRESENT ILLNESS:  This is an 82 year old right-handed male with history of atrial fibrillation, maintained on chronic Coumadin, coronary artery disease with pacemaker, CABG, systolic congestive heart failure.  He lives with his wife.  Reports to be independent prior to admission with generalized decline over the past few months with recent fall.  Presented December 05, 2015, with bright red blood from the rectum as well as recent fall from the toilet 1 week ago.  The patient reports some recent constipation.  Denied any nausea or vomiting.  Hemoglobin 6.5 in the EMERGENCY DEPARTMENT, INR 4.86.  EKG showed new ST changes and T-wave inversions in the anterior leads.  Troponin 0.20.  The patient did receive vitamin K, fresh frozen plasma 2 units, 1 unit packed red blood cells.  Gastroenterology Services consulted.  Dr. Carlean Purl, findings of single solitary ulcer  in the rectum and distal rectum.  Underwent epi injection and then cauterization, close monitoring of hemoglobin status post packed red blood cells x8.  Chronic Coumadin remained on hold.  Vascular study bilateral upper extremities, December 10, 2015, showed superficial thrombus right basilic vein and left cephalic vein.  Initially, placed on subcutaneous heparin for DVT prophylaxis.  Current plan was to resume Coumadin in 2 weeks per Gastroenterology Services.  Follow up Cardiology Services. Echocardiogram with ejection fraction of 35%.  Akinesis of the anterior, inferior, lateral, and apical myocardium.  Placed on intravenous Lasix for acute on chronic diastolic congestive heart failure.  Close monitoring, creatinine 1.89-2.43.  He was treated for an E. coli urinary tract infection with Rocephin through Dec 16, 2015.  Physical and occupational therapy ongoing.  The patient was admitted for comprehensive rehab program on December 09, 2015.  Slow progressive gains. On the evening of December 11, 2015, noted waxing and waning altered mental status, transient episode of being unresponsive.  Cranial CT scan negative.  He was discharged to acute care services.  Neurology consulted.  EEG negative.  Concern of possible seizure, placed on Depakote.  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  CTA of the head showed no emergent large vessel occlusion or stenosis. Mental status continued to improve.  The patient was readmitted  for comprehensive rehab program.  PAST MEDICAL HISTORY:  See discharge diagnoses.  SOCIAL HISTORY:  Lives with wife in [place], [place], independent prior to admission.  Functional status upon admission to rehab services:  Minimal assist, 50 feet, rolling walker; minimal assist, sit to stand; min to mod assist for activities of daily living.  PHYSICAL EXAMINATION:  VITAL SIGNS:  Blood  pressure 98/65, pulse 89, temperature 98, respirations 18. GENERAL:  This was an alert male, in no acute distress.  He had some bruising to the left orbital area inside of face from recent fall. LUNGS:  Clear to auscultation without wheeze. CARDIAC:  Irregularly irregular. ABDOMEN:  Soft, nontender.  Good bowel sounds.  EXTREMITIES:  Numerous bruises on the arms and legs.  [facility] COURSE:  The patient was admitted to inpatient rehab services with therapies initiated on a 3-hour daily basis, consisting of physical therapy, occupational therapy, speech therapy, and rehabilitation nursing.  The following issues were addressed during the patient's rehabilitation stay.  Pertaining to Mr. Wambolt's debilitation and resolving encephalopathy due to GI bleed.  He had undergone at epi injection and cauterization.  Hospital course, superficial thrombus right basilic vein and left cephalic vein.  Repeat Doppler studies, Dec 21, 2015, showed extension of right superficial thrombus to DVT.  He had been on chronic Coumadin prior to hospital admission for atrial fibrillation.  This was held for 2 weeks at the recommendations of Gastroenterology Services.  Blood thinner was resumed with Xarelto 15 mg daily, no Coumadin therapy.  Close followup of CBC, latest hemoglobin 11.2.  He had no chest pain or shortness of breath. He remained on Depakote for seizure prophylaxis, EEG negative.  Bouts of hyperkalemia 6.0, receive Kayexalate, latest potassium level of 4.5.  He completed a course of Rocephin n Dec 16, 2015, for E. coli urinary tract infection.  Question bouts of dysphagia, followed up per Speech Therapy. His diet was slowly advanced, he tolerated well, no other signs of aspiration.  Chronic renal insufficiency, latest creatinine 1.18, remained stable.  He did have some dependent edema, bilateral upper extremities peripheral edema as well as cyanotic distal extremities, Vascular  Surgery did follow up with vascular arterial ultrasound which was unremarkable.  The patient received weekly collaborative interdisciplinary team conferences to discuss estimated length of stay, family teaching, any barriers to his discharge.  The patient performs car transfers, modified independent with a rolling walker.  Demonstrates bed mobility and transfers throughout sessions with rolling walker at modified independence.  Ambulating extended household distances with rolling walker.  Strength and endurance continued to improve throughout his rehabilitation course.  He could gather his belongings for activities of daily living and homemaking.  Immediately, the patient could transfer from bed to wheelchair to continue meal secondary to out of bed instructions with modified independence.  Full family teaching was completed and plan discharge to home.  DISCHARGE MEDICATIONS: 1. Zyloprim 300 mg p.o. daily. 2. Lanoxin 0.0625 mg p.o. daily. 3. Depakote 750 mg p.o. every 12 hours. 4. Claritin 10 mg p.o. daily. 5. Oxycodone immediate release 5 mg every 4 hours as needed pain,     dispense of 60 tablets. 6. Pravachol 20 mg p.o. daily. 7. Xarelto 15 mg p.o. daily.  His diet was as tolerated.  The patient would follow up with Dr. Leanna Battles, medical recommendations, with special attention to K+ levels, periperal edema, and potential GI bleed; Dr. Delice Lesch as needed; Dr. [name], Gastroenterology Service call for appointment; Dr.  Cristopher Peru, Cardiology Services, call for appointment.  A CBC by home health nurse had been ordered for Dec 28, 2015, results to Dr. Leanna Battles.     Lauraine Rinne, P.A.   ______________________________ Delice Lesch, MD    DA/MEDQ  D:  12/23/2015  T:  12/24/2015  Job:  [id number]  cc:   Leanna Battles, MD [name], MD,FACG [place]. Lovena Le, MD

## 2015-12-24 NOTE — Patient Care Conference (Signed)
Inpatient RehabilitationTeam Conference and Plan of Care Update Date: 12/23/2015   Time: 2:35 PM    Patient Name: [name]      Medical Record Number: [id number]  Date of Birth: April 25, 1925 Sex: Male         Room/Bed: 4W10C/4W10C-01 Payor Info: Payor: MEDICARE / Plan: MEDICARE PART A AND B / Product Type: *No Product type* /    Admitting Diagnosis: debility  Admit Date/Time:  12/15/2015  4:35 PM Admission Comments: No comment available   Primary Diagnosis:  Debilitated Principal Problem: Debilitated  Patient Active Problem List   Diagnosis Date Noted  . Acute deep vein thrombosis (DVT) of axillary vein of right upper extremity ([place])   . Heel pain   . Ischemia of finger   . Peripheral edema   . Hyperkalemia   . PAF (paroxysmal atrial fibrillation) ([place])   . History of GI bleed   . E. coli UTI   . Prediabetes   . Dysphagia   . Acute blood loss anemia   . Leukocytosis   . Chronic combined systolic and diastolic congestive heart failure ([place]) 12/15/2015  . Anemia of chronic kidney failure 12/15/2015  . Debilitated 12/15/2015  . Acute encephalopathy   . CKD (chronic kidney disease), stage III   . Bleeding gastrointestinal   . GI bleed 12/05/2015  . Acute lower GI bleeding   . Biventricular cardiac pacemaker in situ 10/11/2011  . Atrial fibrillation [facility])     Expected Discharge Date: Expected Discharge Date: 12/24/15  Team Members Present: Physician leading conference: Dr. Delice Lesch Social Worker Present: Lennart Pall, LCSW Nurse Present: Dorien Chihuahua, RN PT Present: Dwyane Dee, PT;Rodney Lajuana Matte, PT OT Present: Benay Pillow, OT SLP Present: Weston Anna, SLP PPS Coordinator present : Daiva Nakayama, RN, CRRN     Current Status/Progress Goal Weekly Team Focus  Medical   Debilitation and resolved encephalopathy secondary to GI bleed status post epi injection and cauterization/multi-medical with DVT  Improve safety, mobility, tx DVT  see above    Bowel/Bladder   continent X 2, 1 incontinent episode of bladder on 5/10, LBM 5/8  remain continent x2  continue to encourage patient to call staff with help with urinal   Swallow/Nutrition/ Hydration   Full liquid diet, full supervision for use of strategies   Supervision with least restrictive diet  Family education, D/C tomorrow    ADL's   mod I overall with supervision for shower transfer and multiple rest breaks for fatigue  mod I overall, supervision for shower transfer  grad day with d/c planned for thursday   Mobility   mod I all mobility, supervision on stairs  mod I except supervision on stairs and in community  grad day wednesday with d/c planned for thursday   Communication             Safety/Cognition/ Behavioral Observations  Supervision-Min A  Supervision  Family Education    Pain   c/o intermittent pain to the right shoulder   pain scale between 0-4  continue to assess & treat for pain   Skin   Bilateral upper extremity weeping with edema, abd pads & kerlix applied & changed prn  remain free from infection, eleviate edema  elevate arms & monitor for skin breakdown, no BP in arms    Rehab Goals Patient on target to meet rehab goals: Yes *See Care Plan and progress notes for long and short-term goals.  Barriers to Discharge: ABLA, leukocytosis, hyperkalemia, DVT, peripheral edema, left heel  pain    Possible Resolutions to Barriers:  kayex, follow labs, Xarelto started, elevate UE, monitor pain    Discharge Planning/Teaching Needs:  Home with wife who can provide intermittent assistance.  completed with PT, OT with ST to meet with her prior to d/c.   Team Discussion:  New DVT - xarelto;  Chronic swallowing issues = pt chooses full liquid diet.  Ready for d/c tomorrow and meeting mod independent goals.  Revisions to Treatment Plan:  None   Continued Need for Acute Rehabilitation Level of Care: The patient requires daily medical management by a physician with  specialized training in physical medicine and rehabilitation for the following conditions: Daily direction of a multidisciplinary physical rehabilitation program to ensure safe treatment while eliciting the highest outcome that is of practical value to the patient.: Yes Daily medical management of patient stability for increased activity during participation in an intensive rehabilitation regime.: Yes Daily analysis of laboratory values and/or radiology reports with any subsequent need for medication adjustment of medical intervention for : Cardiac problems;Wound care problems;Blood pressure problems;Other  Viktoriya Glaspy 12/24/2015, 11:53 AM

## 2015-12-24 NOTE — Progress Notes (Signed)
Speech Language Pathology Daily Session Note  Patient Details  Name: [name] MRN: [id number] Date of Birth: 09-18-1924  Today's Date: 12/24/2015 SLP Individual Time: [id number] SLP Individual Time Calculation (min): 24 min  Short Term Goals: Week 1: SLP Short Term Goal 1 (Week 1): Patient will demonstrate complex problem solving for familiar tasks with Mod I.  SLP Short Term Goal 2 (Week 1): Patient will recall new, daily information with use of external aids with Mod I.  SLP Short Term Goal 3 (Week 1): Patient will consume current diet with minimal overt s/s of aspiration with Mod I for use of swallowing strategies.   Skilled Therapeutic Interventions: Focus of today's session on pt and family education re: dysphagia and home management. Pt's spouse viewed the MBSS and results were discussed. Reviewed current status of likely aspiration across all consistencies with strategies in place to decrease the risk of PNA. Encouraged upright positioning with all PO intake, intermittent throat clear/swallow, multiple swallows per bolus and alternating solids with liquids. Pt to proceed with Dys 2 type solids with liquids as desired. Pt and spouse able to perform the [facility]. Discussed spouse involvement with cueing  due to decreased memory for pt.    Function:  Eating Eating                 Cognition Comprehension Comprehension assist level: Understands complex 90% of the time/cues 10% of the time  Expression   Expression assist level: Expresses complex ideas: With extra time/assistive device  Social Interaction Social Interaction assist level: Interacts appropriately with others with medication or extra time (anti-anxiety, antidepressant).  Problem Solving Problem solving assist level: Solves basic 90% of the time/requires cueing < 10% of the time  Memory Memory assist level: Recognizes or recalls 90% of the time/requires cueing < 10% of the time    Pain Pain Assessment Pain  Assessment: No/denies pain  Therapy/Group: Individual Therapy  Vinetta Bergamo MA, CCC-SLP 12/24/2015, 3:54 PM

## 2015-12-24 NOTE — Progress Notes (Signed)
Patient A/O, no noted distress. Denies pain. Tolerated all meds well. Skin WNL. Educated on patient on medication. Staff will continue to monitor and meet needs. Patient will be discharged today. Patient uses urinal.

## 2015-12-24 NOTE — Progress Notes (Signed)
Speech Language Pathology Discharge Summary  Patient Details  Name: [name] MRN: [id number] Date of Birth: 10/21/1924  Today's Date: 12/24/2015 SLP Individual Time: 9338-8266 SLP Individual Time Calculation (min): 24 min       Patient has met 2 of 2 long term goals.  Patient to discharge at overall Supervision level.  Reasons goals not met: baseline cognitive barriers   Clinical Impression/Discharge Summary: Pt able to tolerate PO with intermittent supervision for compliance with compensatory strategies.   Care Partner:  Caregiver Able to Provide Assistance: Yes  Type of Caregiver Assistance: Physical;Cognitive  Recommendation:  Home Health SLP  Rationale for SLP Follow Up: Maximize swallowing safety;Reduce caregiver burden;Maximize cognitive function and independence     Reasons for discharge: Discharged from hospital   Patient/Family Agrees with Progress Made and Goals Achieved: Yes   Function:  Eating Eating                 Cognition Comprehension Comprehension assist level: Understands complex 90% of the time/cues 10% of the time  Expression   Expression assist level: Expresses complex ideas: With extra time/assistive device  Social Interaction Social Interaction assist level: Interacts appropriately with others with medication or extra time (anti-anxiety, antidepressant).  Problem Solving Problem solving assist level: Solves basic 90% of the time/requires cueing < 10% of the time  Memory Memory assist level: Recognizes or recalls 90% of the time/requires cueing < 10% of the time   Dorian Pod, CCC-SLP  12/24/2015, 4:09 PM

## 2015-12-24 NOTE — Discharge Instructions (Signed)
Inpatient Rehab Discharge Instructions  [name] Discharge date and time: No discharge date for patient encounter.   Activities/Precautions/ Functional Status: Activity: activity as tolerated Diet:  Wound Care: keep wound clean and dry Functional status:  ___ No restrictions     ___ Walk up steps independently ___ 24/7 supervision/assistance   ___ Walk up steps with assistance ___ Intermittent supervision/assistance  ___ Bathe/dress independently ___ Walk with walker     _x__ Bathe/dress with assistance ___ Walk Independently    ___ Shower independently ___ Walk with assistance    ___ Shower with assistance ___ No alcohol     ___ Return to work/school ________   COMMUNITY REFERRALS UPON DISCHARGE:    Home Health:   PT     OT     ST    RN                     Agency:  [facility] Phone: [phone]    Medical Equipment/Items Ordered: wheelchair, walker, tub seat                                                     Agency/Supplier: [facility] @ [phone]     Special Instructions:  Home health nurse to check CBC and BMET on 12/28/2015 results to Dr. Bevelyn Buckles  My questions have been answered and I understand these instructions. I will adhere to these goals and the provided educational materials after my discharge from the hospital.  Patient/Caregiver Signature _______________________________ Date __________  Clinician Signature _______________________________________ Date __________  Please bring this form and your medication list with you to all your follow-up doctor's appointments.   Information on my medicine - XARELTO (rivaroxaban)  This medication education was reviewed with me or my healthcare representative as part of my discharge preparation.    WHY WAS XARELTO PRESCRIBED FOR YOU? Xarelto was prescribed to treat blood clots that may have been found in the veins of your legs (deep vein thrombosis) or in your lungs (pulmonary embolism) and  to reduce the risk of them occurring again.  What do you need to know about Xarelto? Take 15 mg ONCE A DAY with your evening meal.  DO NOT stop taking Xarelto without talking to the health care provider who prescribed the medication.  Refill your prescription for 20 mg tablets before you run out.  After discharge, you should have regular check-up appointments with your healthcare provider that is prescribing your Xarelto.  In the future your dose may need to be changed if your kidney function changes by a significant amount.  What do you do if you miss a dose? If you are taking Xarelto TWICE DAILY and you miss a dose, take it as soon as you remember. You may take two 15 mg tablets (total 30 mg) at the same time then resume your regularly scheduled 15 mg twice daily the next day.  If you are taking Xarelto ONCE DAILY and you miss a dose, take it as soon as you remember on the same day then continue your regularly scheduled once daily regimen the next day. Do not take two doses of Xarelto at the same time.   Important Safety Information Xarelto is a blood thinner medicine that can cause bleeding. You should call your healthcare provider right away if you  experience any of the following: ? Bleeding from an injury or your nose that does not stop. ? Unusual colored urine (red or dark brown) or unusual colored stools (red or black). ? Unusual bruising for unknown reasons. ? A serious fall or if you hit your head (even if there is no bleeding).  Some medicines may interact with Xarelto and might increase your risk of bleeding while on Xarelto. To help avoid this, consult your healthcare provider or pharmacist prior to using any new prescription or non-prescription medications, including herbals, vitamins, non-steroidal anti-inflammatory drugs (NSAIDs) and supplements.  This website has more information on Xarelto: [email or web address].[email or web address]

## 2015-12-24 NOTE — Progress Notes (Signed)
Social Work Patient ID: [name], male   DOB: 1924-10-07, 82 y.o.   MRN: [id number]   Roberto Curb, LCSW Social Worker Signed  Patient Care Conference 12/24/2015 11:53 AM    Expand All Collapse All   Inpatient RehabilitationTeam Conference and Plan of Care Update Date: 12/23/2015   Time: 2:35 PM     Patient Name: [name]       Medical Record Number: [id number]  Date of Birth: 12-28-1924 Sex: Male         Room/Bed: 4W10C/4W10C-01 Payor Info: Payor: MEDICARE / Plan: MEDICARE PART A AND B / Product Type: *No Product type* /    Admitting Diagnosis: debility   Admit Date/Time:  12/15/2015  4:35 PM Admission Comments: No comment available   Primary Diagnosis:  Debilitated Principal Problem: Debilitated    Patient Active Problem List     Diagnosis  Date Noted   .  Acute deep vein thrombosis (DVT) of axillary vein of right upper extremity ([place])     .  Heel pain     .  Ischemia of finger     .  Peripheral edema     .  Hyperkalemia     .  PAF (paroxysmal atrial fibrillation) ([place])     .  History of GI bleed     .  E. coli UTI     .  Prediabetes     .  Dysphagia     .  Acute blood loss anemia     .  Leukocytosis     .  Chronic combined systolic and diastolic congestive heart failure ([place])  12/15/2015   .  Anemia of chronic kidney failure  12/15/2015   .  Debilitated  12/15/2015   .  Acute encephalopathy     .  CKD (chronic kidney disease), stage III     .  Bleeding gastrointestinal     .  GI bleed  12/05/2015   .  Acute lower GI bleeding     .  Biventricular cardiac pacemaker in situ  10/11/2011   .  Atrial fibrillation [facility])       Expected Discharge Date: Expected Discharge Date: 12/24/15  Team Members Present: Physician leading conference: Dr. Delice Lesch Social Worker Present: Roberto Pall, LCSW Nurse Present: Roberto Chihuahua, RN PT Present: [name], PT;[name], PT OT Present: [name], OT SLP Present: [name], SLP PPS Coordinator present :  Roberto Nakayama, RN, CRRN        Current Status/Progress  Goal  Weekly Team Focus   Medical     Debilitation and resolved encephalopathy secondary to GI bleed status post epi injection and cauterization/multi-medical with DVT  Improve safety, mobility, tx DVT  see above   Bowel/Bladder     continent X 2, 1 incontinent episode of bladder on 5/10, LBM 5/8  remain continent x2  continue to encourage patient to call staff with help with urinal   Swallow/Nutrition/ Hydration     Full liquid diet, full supervision for use of strategies   Supervision with least restrictive diet   Family education, D/C tomorrow    ADL's     mod I overall with supervision for shower transfer and multiple rest breaks for fatigue  mod I overall, supervision for shower transfer   grad day with d/c planned for thursday    Mobility     mod I all mobility, supervision on stairs   mod I except supervision on stairs and  in community   grad day wednesday with d/c planned for thursday    Communication               Safety/Cognition/ Behavioral Observations    Supervision-Min A  Supervision  Family Education    Pain     c/o intermittent pain to the right shoulder   pain scale between 0-4  continue to assess & treat for pain    Skin     Bilateral upper extremity weeping with edema, abd pads & kerlix applied & changed prn  remain free from infection, eleviate edema   elevate arms & monitor for skin breakdown, no BP in arms    Rehab Goals Patient on target to meet rehab goals: Yes *See Care Plan and progress notes for long and short-term goals.    Barriers to Discharge:  ABLA, leukocytosis, hyperkalemia, DVT, peripheral edema, left heel pain     Possible Resolutions to Barriers:   kayex, follow labs, Xarelto started, elevate UE, monitor pain     Discharge Planning/Teaching Needs:   Home with wife who can provide intermittent assistance.  completed with PT, OT with ST to meet with her prior to d/c.    Team Discussion:     New DVT - xarelto;  Chronic swallowing issues = pt chooses full liquid diet.  Ready for d/c tomorrow and meeting mod independent goals.   Revisions to Treatment Plan:    None    Continued Need for Acute Rehabilitation Level of Care: The patient requires daily medical management by a physician with specialized training in physical medicine and rehabilitation for the following conditions: Daily direction of a multidisciplinary physical rehabilitation program to ensure safe treatment while eliciting the highest outcome that is of practical value to the patient.: Yes Daily medical management of patient stability for increased activity during participation in an intensive rehabilitation regime.: Yes Daily analysis of laboratory values and/or radiology reports with any subsequent need for medication adjustment of medical intervention for : Cardiac problems;Wound care problems;Blood pressure problems;Other  [name] 12/24/2015, 11:53 AM

## 2015-12-28 ENCOUNTER — Other Ambulatory Visit (HOSPITAL_COMMUNITY)
Admission: RE | Admit: 2015-12-28 | Discharge: 2015-12-28 | Disposition: A | Discharge status: Home / Self Care | Payer: Medicare Other | Source: Other Acute Inpatient Hospital | Attending: Internal Medicine | Admitting: Internal Medicine

## 2015-12-28 ENCOUNTER — Encounter: Payer: Self-pay | Admitting: Physician Assistant

## 2015-12-28 DIAGNOSIS — N390 Urinary tract infection, site not specified: Secondary | ICD-10-CM | POA: Insufficient documentation

## 2015-12-28 DIAGNOSIS — R3915 Urgency of urination: Secondary | ICD-10-CM | POA: Insufficient documentation

## 2015-12-28 LAB — URINALYSIS, ROUTINE W REFLEX MICROSCOPIC
BILIRUBIN URINE: NEGATIVE
GLUCOSE, UA: NEGATIVE mg/dL
HGB URINE DIPSTICK: NEGATIVE
KETONES UR: NEGATIVE mg/dL
LEUKOCYTES UA: NEGATIVE
Nitrite: NEGATIVE
PROTEIN: NEGATIVE mg/dL
Specific Gravity, Urine: 1.01 (ref 1.005–1.030)
pH: 6 (ref 5.0–8.0)

## 2015-12-30 LAB — URINE CULTURE

## 2015-12-31 ENCOUNTER — Emergency Department (HOSPITAL_COMMUNITY): Payer: Medicare Other

## 2015-12-31 ENCOUNTER — Encounter (HOSPITAL_COMMUNITY): Payer: Self-pay | Admitting: Emergency Medicine

## 2015-12-31 ENCOUNTER — Inpatient Hospital Stay (HOSPITAL_COMMUNITY)
Admission: EM | Admit: 2015-12-31 | Discharge: 2016-01-05 | DRG: 177 | Disposition: A | Discharge status: Hospice | Payer: Medicare Other | Attending: Internal Medicine | Admitting: Internal Medicine

## 2015-12-31 DIAGNOSIS — I255 Ischemic cardiomyopathy: Secondary | ICD-10-CM | POA: Diagnosis present

## 2015-12-31 DIAGNOSIS — R627 Adult failure to thrive: Secondary | ICD-10-CM | POA: Diagnosis present

## 2015-12-31 DIAGNOSIS — I272 Other secondary pulmonary hypertension: Secondary | ICD-10-CM | POA: Diagnosis present

## 2015-12-31 DIAGNOSIS — N179 Acute kidney failure, unspecified: Secondary | ICD-10-CM | POA: Insufficient documentation

## 2015-12-31 DIAGNOSIS — N183 Chronic kidney disease, stage 3 unspecified: Secondary | ICD-10-CM | POA: Diagnosis present

## 2015-12-31 DIAGNOSIS — E1122 Type 2 diabetes mellitus with diabetic chronic kidney disease: Secondary | ICD-10-CM | POA: Diagnosis present

## 2015-12-31 DIAGNOSIS — I252 Old myocardial infarction: Secondary | ICD-10-CM

## 2015-12-31 DIAGNOSIS — N189 Chronic kidney disease, unspecified: Secondary | ICD-10-CM

## 2015-12-31 DIAGNOSIS — I130 Hypertensive heart and chronic kidney disease with heart failure and stage 1 through stage 4 chronic kidney disease, or unspecified chronic kidney disease: Secondary | ICD-10-CM | POA: Diagnosis present

## 2015-12-31 DIAGNOSIS — I5021 Acute systolic (congestive) heart failure: Secondary | ICD-10-CM | POA: Diagnosis present

## 2015-12-31 DIAGNOSIS — L8990 Pressure ulcer of unspecified site, unspecified stage: Secondary | ICD-10-CM | POA: Insufficient documentation

## 2015-12-31 DIAGNOSIS — J9601 Acute respiratory failure with hypoxia: Secondary | ICD-10-CM | POA: Diagnosis present

## 2015-12-31 DIAGNOSIS — Z681 Body mass index (BMI) 19 or less, adult: Secondary | ICD-10-CM

## 2015-12-31 DIAGNOSIS — Z951 Presence of aortocoronary bypass graft: Secondary | ICD-10-CM | POA: Diagnosis not present

## 2015-12-31 DIAGNOSIS — R7303 Prediabetes: Secondary | ICD-10-CM | POA: Diagnosis present

## 2015-12-31 DIAGNOSIS — I5043 Acute on chronic combined systolic (congestive) and diastolic (congestive) heart failure: Secondary | ICD-10-CM | POA: Diagnosis present

## 2015-12-31 DIAGNOSIS — Z950 Presence of cardiac pacemaker: Secondary | ICD-10-CM | POA: Diagnosis not present

## 2015-12-31 DIAGNOSIS — M109 Gout, unspecified: Secondary | ICD-10-CM | POA: Diagnosis present

## 2015-12-31 DIAGNOSIS — E785 Hyperlipidemia, unspecified: Secondary | ICD-10-CM | POA: Diagnosis present

## 2015-12-31 DIAGNOSIS — R64 Cachexia: Secondary | ICD-10-CM | POA: Diagnosis present

## 2015-12-31 DIAGNOSIS — K219 Gastro-esophageal reflux disease without esophagitis: Secondary | ICD-10-CM | POA: Diagnosis present

## 2015-12-31 DIAGNOSIS — I959 Hypotension, unspecified: Secondary | ICD-10-CM | POA: Diagnosis present

## 2015-12-31 DIAGNOSIS — I2510 Atherosclerotic heart disease of native coronary artery without angina pectoris: Secondary | ICD-10-CM | POA: Diagnosis present

## 2015-12-31 DIAGNOSIS — Z79899 Other long term (current) drug therapy: Secondary | ICD-10-CM

## 2015-12-31 DIAGNOSIS — J690 Pneumonitis due to inhalation of food and vomit: Principal | ICD-10-CM | POA: Diagnosis present

## 2015-12-31 DIAGNOSIS — D631 Anemia in chronic kidney disease: Secondary | ICD-10-CM | POA: Diagnosis present

## 2015-12-31 DIAGNOSIS — Z7901 Long term (current) use of anticoagulants: Secondary | ICD-10-CM | POA: Diagnosis not present

## 2015-12-31 DIAGNOSIS — Z880 Allergy status to penicillin: Secondary | ICD-10-CM

## 2015-12-31 DIAGNOSIS — Z66 Do not resuscitate: Secondary | ICD-10-CM | POA: Diagnosis present

## 2015-12-31 DIAGNOSIS — Z515 Encounter for palliative care: Secondary | ICD-10-CM | POA: Insufficient documentation

## 2015-12-31 DIAGNOSIS — E43 Unspecified severe protein-calorie malnutrition: Secondary | ICD-10-CM | POA: Diagnosis present

## 2015-12-31 DIAGNOSIS — R1310 Dysphagia, unspecified: Secondary | ICD-10-CM

## 2015-12-31 DIAGNOSIS — I480 Paroxysmal atrial fibrillation: Secondary | ICD-10-CM | POA: Diagnosis present

## 2015-12-31 DIAGNOSIS — Z8719 Personal history of other diseases of the digestive system: Secondary | ICD-10-CM

## 2015-12-31 DIAGNOSIS — I4891 Unspecified atrial fibrillation: Secondary | ICD-10-CM | POA: Diagnosis present

## 2015-12-31 DIAGNOSIS — R5381 Other malaise: Secondary | ICD-10-CM | POA: Diagnosis present

## 2015-12-31 DIAGNOSIS — D72829 Elevated white blood cell count, unspecified: Secondary | ICD-10-CM | POA: Diagnosis present

## 2015-12-31 DIAGNOSIS — Z7401 Bed confinement status: Secondary | ICD-10-CM

## 2015-12-31 HISTORY — DX: Type 2 diabetes mellitus without complications: E11.9

## 2015-12-31 LAB — URINALYSIS, ROUTINE W REFLEX MICROSCOPIC
BILIRUBIN URINE: NEGATIVE
Glucose, UA: NEGATIVE mg/dL
Ketones, ur: NEGATIVE mg/dL
Leukocytes, UA: NEGATIVE
Nitrite: NEGATIVE
PH: 7 (ref 5.0–8.0)
Protein, ur: NEGATIVE mg/dL
SPECIFIC GRAVITY, URINE: 1.017 (ref 1.005–1.030)

## 2015-12-31 LAB — DIFFERENTIAL
BAND NEUTROPHILS: 0 %
BASOS PCT: 0 %
Basophils Absolute: 0 10*3/uL (ref 0.0–0.1)
Blasts: 0 %
Eosinophils Absolute: 0.6 10*3/uL (ref 0.0–0.7)
Eosinophils Relative: 3 %
LYMPHS ABS: 0.6 10*3/uL — AB (ref 0.7–4.0)
Lymphocytes Relative: 3 %
MONO ABS: 3.2 10*3/uL — AB (ref 0.1–1.0)
MYELOCYTES: 0 %
Metamyelocytes Relative: 0 %
Monocytes Relative: 17 %
NRBC: 0 /100{WBCs}
Neutro Abs: 14.7 10*3/uL — ABNORMAL HIGH (ref 1.7–7.7)
Neutrophils Relative %: 77 %
OTHER: 0 %
PROMYELOCYTES ABS: 0 %
WBC Morphology: INCREASED

## 2015-12-31 LAB — PROTIME-INR
INR: 2.81 — ABNORMAL HIGH (ref 0.00–1.49)
Prothrombin Time: 29.2 seconds — ABNORMAL HIGH (ref 11.6–15.2)

## 2015-12-31 LAB — COMPREHENSIVE METABOLIC PANEL
ALT: 18 U/L (ref 17–63)
AST: 40 U/L (ref 15–41)
Albumin: 2.4 g/dL — ABNORMAL LOW (ref 3.5–5.0)
Alkaline Phosphatase: 182 U/L — ABNORMAL HIGH (ref 38–126)
Anion gap: 11 (ref 5–15)
BUN: 29 mg/dL — ABNORMAL HIGH (ref 6–20)
CHLORIDE: 102 mmol/L (ref 101–111)
CO2: 23 mmol/L (ref 22–32)
CREATININE: 1.56 mg/dL — AB (ref 0.61–1.24)
Calcium: 8.1 mg/dL — ABNORMAL LOW (ref 8.9–10.3)
GFR, EST AFRICAN AMERICAN: 44 mL/min — AB (ref >60)
GFR, EST NON AFRICAN AMERICAN: 38 mL/min — AB (ref >60)
Glucose, Bld: 83 mg/dL (ref 65–99)
POTASSIUM: 5.3 mmol/L — AB (ref 3.5–5.1)
Sodium: 136 mmol/L (ref 135–145)
Total Bilirubin: 0.5 mg/dL (ref 0.3–1.2)
Total Protein: 6.3 g/dL — ABNORMAL LOW (ref 6.5–8.1)

## 2015-12-31 LAB — BRAIN NATRIURETIC PEPTIDE: B NATRIURETIC PEPTIDE 5: 2119.6 pg/mL — AB (ref 0.0–100.0)

## 2015-12-31 LAB — URINE MICROSCOPIC-ADD ON

## 2015-12-31 LAB — CBC
HCT: 39.2 % (ref 39.0–52.0)
HEMOGLOBIN: 12.9 g/dL — AB (ref 13.0–17.0)
MCH: 32 pg (ref 26.0–34.0)
MCHC: 32.9 g/dL (ref 30.0–36.0)
MCV: 97.3 fL (ref 78.0–100.0)
Platelets: 170 10*3/uL (ref 150–400)
RBC: 4.03 MIL/uL — AB (ref 4.22–5.81)
RDW: 20.6 % — ABNORMAL HIGH (ref 11.5–15.5)
WBC: 19.1 10*3/uL — ABNORMAL HIGH (ref 4.0–10.5)

## 2015-12-31 LAB — PROCALCITONIN: PROCALCITONIN: 0.51 ng/mL

## 2015-12-31 LAB — DIGOXIN LEVEL: DIGOXIN LVL: 1 ng/mL (ref 0.8–2.0)

## 2015-12-31 MED ORDER — CLINDAMYCIN PHOSPHATE 600 MG/50ML IV SOLN
600.0000 mg | Freq: Once | INTRAVENOUS | Status: AC
  Administered 2015-12-31: 600 mg via INTRAVENOUS
  Filled 2015-12-31: qty 50

## 2015-12-31 MED ORDER — RIVAROXABAN 15 MG PO TABS: 15.0000 mg | ORAL_TABLET | Freq: Every day | ORAL | Status: DC

## 2015-12-31 MED ORDER — ACETAMINOPHEN 325 MG PO TABS: 650.0000 mg | ORAL_TABLET | ORAL | Status: DC | PRN

## 2015-12-31 MED ORDER — DIVALPROEX SODIUM 250 MG PO DR TAB
750.0000 mg | DELAYED_RELEASE_TABLET | Freq: Two times a day (BID) | ORAL | Status: DC
  Administered 2015-12-31: 750 mg via ORAL
  Filled 2015-12-31: qty 3

## 2015-12-31 MED ORDER — ALPRAZOLAM 0.5 MG PO TABS
1.0000 mg | ORAL_TABLET | Freq: Every day | ORAL | Status: DC
  Administered 2015-12-31: 1 mg via ORAL
  Filled 2015-12-31: qty 2

## 2015-12-31 MED ORDER — LEVOFLOXACIN IN D5W 750 MG/150ML IV SOLN
750.0000 mg | INTRAVENOUS | Status: DC
  Administered 2016-01-02 – 2016-01-04 (×2): 750 mg via INTRAVENOUS
  Filled 2015-12-31 (×3): qty 150

## 2015-12-31 MED ORDER — ONDANSETRON HCL 4 MG/2ML IJ SOLN: 4.0000 mg | Freq: Four times a day (QID) | INTRAMUSCULAR | Status: DC | PRN

## 2015-12-31 MED ORDER — SODIUM CHLORIDE 0.9% FLUSH: 3.0000 mL | INTRAVENOUS | Status: DC | PRN

## 2015-12-31 MED ORDER — FUROSEMIDE 10 MG/ML IJ SOLN
80.0000 mg | Freq: Once | INTRAMUSCULAR | Status: AC
  Administered 2015-12-31: 80 mg via INTRAVENOUS
  Filled 2015-12-31: qty 8

## 2015-12-31 MED ORDER — LORATADINE 10 MG PO TABS
10.0000 mg | ORAL_TABLET | Freq: Every day | ORAL | Status: DC
  Administered 2015-12-31: 10 mg via ORAL
  Filled 2015-12-31: qty 1

## 2015-12-31 MED ORDER — SODIUM CHLORIDE 0.9 % IV SOLN: 250.0000 mL | INTRAVENOUS | Status: DC | PRN

## 2015-12-31 MED ORDER — LEVOFLOXACIN IN D5W 750 MG/150ML IV SOLN
750.0000 mg | Freq: Once | INTRAVENOUS | Status: AC
  Administered 2015-12-31: 750 mg via INTRAVENOUS
  Filled 2015-12-31: qty 150

## 2015-12-31 MED ORDER — DIGOXIN 125 MCG PO TABS
0.0625 mg | ORAL_TABLET | Freq: Every day | ORAL | Status: DC
  Administered 2015-12-31: 0.0625 mg via ORAL
  Filled 2015-12-31: qty 1

## 2015-12-31 MED ORDER — FUROSEMIDE 10 MG/ML IJ SOLN
80.0000 mg | Freq: Two times a day (BID) | INTRAMUSCULAR | Status: DC
  Filled 2015-12-31: qty 8

## 2015-12-31 MED ORDER — PRAVASTATIN SODIUM 20 MG PO TABS: 20.0000 mg | ORAL_TABLET | Freq: Every day | ORAL | Status: DC

## 2015-12-31 MED ORDER — SODIUM CHLORIDE 0.9% FLUSH
3.0000 mL | Freq: Two times a day (BID) | INTRAVENOUS | Status: DC
  Administered 2016-01-01 – 2016-01-05 (×8): 3 mL via INTRAVENOUS

## 2015-12-31 MED ORDER — SODIUM CHLORIDE 0.9 % IV BOLUS (SEPSIS): 500.0000 mL | Freq: Once | INTRAVENOUS | Status: DC

## 2015-12-31 NOTE — ED Notes (Signed)
Patient lives with wife and recently discharge from hospital a week ago. Arrived via EMS Since discharge general weakness and has a history of dysphagia. Today home health representative feeding patient medication with apple sauce. Had a choking episode and EMS called. Patient airway intact bilateral equal chest rise and fall.

## 2015-12-31 NOTE — Progress Notes (Signed)
Pt difficult IV start even with [place] due to scar tissue and lack of veins suitable for an IV, advised ED MD that an IV was obtained with [place] but if it fails it will be very difficult to obtain new access so it would be a good idea to go ahead and order a PICC line for pt to receive IV antibiotics and that due to difficulty with PICC placement in the past it would be best for IR to place.

## 2015-12-31 NOTE — ED Notes (Signed)
Pt returned from xray

## 2015-12-31 NOTE — H&P (Signed)
History and Physical    [name] [id number] DOB: 07-19-1925 DOA: 12/31/2015   PCP: Donnajean Lopes, MD   Patient coming from/Resides with: Private residence/lives with wife  Chief Complaint: Choking while eating and failure to thrive  HPI: [name] is a 82 y.o. male with medical history significant for a recent protracted hospitalization secondary to GI bleed on Coumadin with associated severe metabolic encephalopathy and non-STEMI. GI evaluation revealed a single solitary rectal ulcer and patient was successfully restarted on anticoagulation with Xarelto. During that hospitalization patient was found to have a superficial thrombus in the right basilic and left cephalic vein. He has known chronic systolic heart failure with an EF of 30-35% and initially presented with heart failure symptoms and diuresed adequately on IV Lasix but at some point during the hospitalization he compensated and the Lasix was discontinued. Patient was evaluated by neurology because of his encephalopathic state during the hospitalization and there were concerns over possible seizure so he was placed on Depakote which was continued at discharge. Patient eventually left the acute side to go to rehabilitation at [facility]. he was subsequently discharged from rehabilitation on 5/11. Other evaluation included episodes of dysphagia requiring diet modification. PT evaluations documented this patient performing car transfers modified independent ambulation with a rolling walker and appropriate bed transfers.  Since discharge the patient's wife reports he has become rapidly and progressively debilitated. He is having excessive fatigue since arriving home and has had recurrent choking episodes while eating. Today he was found to have his Depakote pill stuck in his throat and he has been choking on applesauce which previously he had not been doing. Because of these symptoms patient was brought to the ER by his wife for further  evaluation and treatment.  ED Course:  PO temp 97.7-BP 122/62-pulse 91-respirations 14-room air saturations 90% Two-view chest x-ray: Bilateral pleural effusions with cardiomegaly with a degree of congestive heart failure CT of head without contrast: No acute findings, fluid level within the left maxillary sinus consistent with sinusitis Lab data: Sodium 136, potassium 5.3, BUN 29, creatinine 1.56, glucose 83, alkaline phosphatase 182, WBCs 19,100 with neutrophils 77% absolute neutrophils 14.7%, hemoglobin 12.9, platelets 170,000, PT 29.2, INR 2.81, urinalysis unremarkable except for rare bacteria moderate hemoglobin and 0-5 wbc's Clindamycin 600 mg IV 1 dose Levaquin 750 mg IV 1 dose  Review of Systems:  In addition to the HPI above,  No Fever-chills, myalgias or other constitutional symptoms No Headache, changes with Vision or hearing, new weakness, tingling, numbness in any extremity, No problems swallowing food or Liquids, indigestion/reflux No Chest pain, palpitations, orthopnea or DOE No Abdominal pain, N/V; no melena or hematochezia, no dark tarry stools,  No dysuria, hematuria or flank pain No new skin rashes, lesions, masses or bruises, No new joints pains-aches No recent weight gain or loss No polyuria, polydypsia or polyphagia,   Past Medical History  Diagnosis Date  . Atrial fibrillation (HCC)     Chronic  . Coronary artery disease 2004    Status post CABG with LIMA-LAD, SVG-CFX. Hx of anterior apical infarct.  . Renal insufficiency     Chronic  . Hypertension   . Hyperlipidemia   . Gout   . CHF (congestive heart failure) (HCC)     with chronic ischemic cardiomypathy. EF of 10-15%.  CHF due to systolic dysfunction. Clinically doing well.  [name] GERD (gastroesophageal reflux disease)   . Pacemaker     Medtronic biventricular pacemaker, serial number [id number]  .  H/O hiatal hernia   . Arthritis     "right thumb" (03/20/2013)  . Depression   . Anxiety   . Bladder  cancer ([place])     "low grade; grade 1; non-invasive" (03/20/2013)  . CKD (chronic kidney disease), stage III   . Diabetes mellitus without complication [facility])     Past Surgical History  Procedure Laterality Date  . Transurethral resection of prostate  1990's; 2004    "twice; injected chemo 1st time; didn't do that 2nd time" (03/20/2013)  . Nasal septum surgery    . US echocardiography  04-17-2009    Est EF 10-15%  . Cardiovascular stress test  04-17-2009    EF 29%  . Coronary artery bypass graft  02/19/2003    LIMA-LAD, SVG-CFX  . Carotid endarterectomy Right 1990's    "left side is still  100% blocked" (03/20/2013)  . Icd lead removal Left 03/01/2013    Procedure: ICD LEAD REMOVAL;  Surgeon: Evans Lance, MD;  Location: [place];  Service: Cardiovascular;  Laterality: Left;  . Pacemaker lead removal Left 03/01/2013    Procedure: PACEMAKER LEAD REMOVAL;  Surgeon: Evans Lance, MD;  Location: [place];  Service: Cardiovascular;  Laterality: Left;  . Pacemaker insertion N/A 03/01/2013    Procedure: INSERTION PACEMAKER LEAD;  Surgeon: Evans Lance, MD;  Location: [place];  Service: Cardiovascular;  Laterality: N/A;  . Hemorrhoid surgery      "lanced several years ago" (03/20/2013)  . Tonsillectomy and adenoidectomy  1933  . Cataract extraction w/ intraocular lens  implant, bilateral Bilateral ~ 2000  . Bi-ventricular pacemaker insertion N/A 03/20/2013    Procedure: BI-VENTRICULAR PACEMAKER INSERTION (CRT-P);  Surgeon: Evans Lance, MD; Medtronic biventricular pacemaker, serial number [id number]; Laterality: Right  . Flexible sigmoidoscopy N/A 12/05/2015    Procedure: FLEXIBLE SIGMOIDOSCOPY;  Surgeon: [name], MD;  Location: [place];  Service: Endoscopy;  Laterality: N/A;     reports that he quit smoking about 55 years ago. His smoking use included Cigarettes. He has a 17 pack-year smoking history. He has never used smokeless tobacco. He reports that he drinks alcohol. He reports that he does  not use illicit drugs.  Mobility: Full assist to utilize rolling walker but over the past several days has been too weak to mobilize independently Work history: Did not obtain   Allergies  Allergen Reactions  . Penicillins Hives and Itching    Has patient had a PCN reaction causing immediate rash, facial/tongue/throat swelling, SOB or lightheadedness with hypotension: YES Has patient had a PCN reaction causing severe rash involving mucus membranes or skin necrosis: NO Has patient had a PCN reaction that required hospitalizationNO Has patient had a PCN reaction occurring within the last 10 years: NO If all of the above answers are "NO", then may proceed with Cephalosporin use.    Family History  Problem Relation Age of Onset  . Heart attack Mother    t   Prior to Admission medications   Medication Sig Start Date End Date Taking? Authorizing Provider  acetaminophen (TYLENOL) 325 MG tablet Take 2 tablets (650 mg total) by mouth every 6 (six) hours as needed for mild pain, moderate pain, fever or headache. 12/15/15  Yes Robbie Lis, MD  allopurinol (ZYLOPRIM) 300 MG tablet Take 1 tablet (300 mg total) by mouth daily. 12/24/15  Yes Daniel J Angiulli, PA-C  ALPRAZolam Duanne Moron) 0.5 MG tablet Take 1 mg by mouth at bedtime.  04/07/11  Yes Historical Provider,  MD  calcium-vitamin D (OSCAL WITH D) 500-200 MG-UNIT per tablet Take 1 tablet by mouth daily.    Yes Historical Provider, MD  digoxin (LANOXIN) 0.125 MG tablet Take 0.5 tablets (0.0625 mg total) by mouth daily. 12/24/15  Yes Daniel J Angiulli, PA-C  divalproex (DEPAKOTE) 250 MG DR tablet Take 3 tablets (750 mg total) by mouth every 12 (twelve) hours. 12/24/15  Yes Daniel J Angiulli, PA-C  loratadine (CLARITIN) 10 MG tablet Take 1 tablet (10 mg total) by mouth daily. 12/24/15  Yes Daniel J Angiulli, PA-C  lovastatin (MEVACOR) 20 MG tablet Take 1 tablet (20 mg total) by mouth at bedtime. Reported on 11/24/2015 12/24/15  Yes Daniel J Angiulli, PA-C    multivitamin-lutein (OCUVITE-LUTEIN) CAPS Take 1 capsule by mouth daily.   Yes Historical Provider, MD  Naphazoline HCl (CLEAR EYES OP) Place 1 drop into both eyes daily as needed (irritation/ eye drop).   Yes Historical Provider, MD  OVER THE COUNTER MEDICATION Place 1 patch onto the skin as directed. Wound Care that patients wife gets from [place]. The patient has on a patch that is supposed to help heal his bed sore it is located on buttocks.   Yes Historical Provider, MD  oxyCODONE (OXY IR/ROXICODONE) 5 MG immediate release tablet Take 1 tablet (5 mg total) by mouth every 4 (four) hours as needed for moderate pain. 12/24/15  Yes Daniel J Angiulli, PA-C  Rivaroxaban (XARELTO) 15 MG TABS tablet Take 1 tablet (15 mg total) by mouth daily. 12/24/15  Yes Daniel J Angiulli, PA-C  vitamin B-12 (CYANOCOBALAMIN) 1000 MCG tablet Take 1,000 mcg by mouth daily.   Yes Historical Provider, MD    Physical Exam: Filed Vitals:   12/31/15 1430 12/31/15 1600 12/31/15 1630 12/31/15 1700  BP: 110/51 145/94 113/70 122/63  Pulse: 89 98    Temp:      TempSrc:      Resp:  23 26   Weight:      SpO2: 81% 98%        Constitutional: Frail appearing underweight male Eyes: PERRL, lids and conjunctivae normal ENMT: Mucous membranes are moist. Posterior pharynx clear of any exudate or lesions.Normal dentition.  Neck: normal, supple, no masses, no thyromegaly Respiratory: Nonlabored at rest. Scattered bilateral crackles in the bases but exam limited by patient inability to turn and assist with examination therefore posterior lung sounds are not auscultated, pulse oximetry variable but most recent readings 98% on room air, no accessory muscle use and effort is nonlabored; note patient very thin and rib cage seen bilaterally Cardiovascular: Regular rate and rhythm, grade [id number] systolic murmur left sternal border third intercostal space, no rubs / gallops. Marked 3-4+ bilateral upper and lower extremity edema  with an associated anasarca pattern. 2+ pedal pulses. No carotid bruits.  Abdomen: no tenderness, no masses palpated. No hepatosplenomegaly. Bowel sounds positive.  Musculoskeletal: no clubbing / cyanosis. No joint deformity upper and lower extremities. Good ROM, no contractures. Poor muscle tone Skin: no rashes, lesions, ulcers. No induration Neurologic: CN 2-12 grossly intact. Sensation intact, Strength 3-4/5 x all 4 extremities.  Psychiatric: Normal judgment and insight. Alert and oriented to name only. Flat affect    Labs on Admission: I have personally reviewed following labs and imaging studies  CBC:  Recent Labs Lab 12/31/15 1133  WBC 19.1*  NEUTROABS 14.7*  HGB 12.9*  HCT 39.2  MCV 97.3  PLT 123XX123   Basic Metabolic Panel:  Recent Labs Lab 12/31/15 1133  NA 136  K 5.3*  CL 102  CO2 23  GLUCOSE 83  BUN 29*  CREATININE 1.56*  CALCIUM 8.1*   GFR: Estimated Creatinine Clearance: 24.2 mL/min (by C-G formula based on Cr of 1.56). Liver Function Tests:  Recent Labs Lab 12/31/15 1133  AST 40  ALT 18  ALKPHOS 182*  BILITOT 0.5  PROT 6.3*  ALBUMIN 2.4*   No results for input(s): LIPASE, AMYLASE in the last 168 hours. No results for input(s): AMMONIA in the last 168 hours. Coagulation Profile:  Recent Labs Lab 12/31/15 1303  INR 2.81*   Cardiac Enzymes: No results for input(s): CKTOTAL, CKMB, CKMBINDEX, TROPONINI in the last 168 hours. BNP (last 3 results) No results for input(s): PROBNP in the last 8760 hours. HbA1C: No results for input(s): HGBA1C in the last 72 hours. CBG: No results for input(s): GLUCAP in the last 168 hours. Lipid Profile: No results for input(s): CHOL, HDL, LDLCALC, TRIG, CHOLHDL, LDLDIRECT in the last 72 hours. Thyroid Function Tests: No results for input(s): TSH, T4TOTAL, FREET4, T3FREE, THYROIDAB in the last 72 hours. Anemia Panel: No results for input(s): VITAMINB12, FOLATE, FERRITIN, TIBC, IRON, RETICCTPCT in the last 72  hours. Urine analysis:    Component Value Date/Time   COLORURINE YELLOW 12/31/2015 [place] 12/31/2015 1322   LABSPEC 1.017 12/31/2015 1322   PHURINE 7.0 12/31/2015 1322   GLUCOSEU NEGATIVE 12/31/2015 1322   HGBUR MODERATE* 12/31/2015 1322   BILIRUBINUR NEGATIVE 12/31/2015 1322   KETONESUR NEGATIVE 12/31/2015 1322   PROTEINUR NEGATIVE 12/31/2015 1322   UROBILINOGEN 0.2 06/28/2014 1401   NITRITE NEGATIVE 12/31/2015 1322   LEUKOCYTESUR NEGATIVE 12/31/2015 1322   Sepsis Labs: @LABRCNTIP (procalcitonin:4,lacticidven:4) ) Recent Results (from the past 240 hour(s))  Urine culture     Status: Abnormal   Collection Time: 12/28/15  1:00 PM  Result Value Ref Range Status   Specimen Description URINE, CLEAN CATCH  Final   Special Requests NONE  Final   Culture MULTIPLE SPECIES PRESENT, SUGGEST RECOLLECTION (A)  Final   Report Status 12/30/2015 FINAL  Final     Radiological Exams on Admission: Dg Chest 2 View  12/31/2015  CLINICAL DATA:  Hypertension with atrial fibrillation. Concern for aspiration EXAM: CHEST  2 VIEW COMPARISON:  Dec 17, 2015 FINDINGS: There are bilateral pleural effusions. There is no edema or consolidation evident. Heart is mildly enlarged with pulmonary vascularity within normal limits. Pacemaker lead positions are unchanged. No adenopathy. Patient is status post coronary artery bypass grafting. No bone lesions evident. There is evidence of old trauma involving mid left clavicle. IMPRESSION: Bilateral pleural effusions with cardiomegaly. Suspect a degree of congestive heart failure. No airspace consolidation evident. Stable cardiac silhouette. Electronically Signed   By: Lowella Grip III M.D.   On: 12/31/2015 12:44   Ct Head Wo Contrast  12/31/2015  CLINICAL DATA:  Altered mental status, headache and confusion EXAM: CT HEAD WITHOUT CONTRAST TECHNIQUE: Contiguous axial images were obtained from the base of the skull through the vertex without intravenous  contrast. COMPARISON:  None. FINDINGS: No acute intracranial hemorrhage. No focal mass lesion. No CT evidence of acute infarction. No midline shift or mass effect. No hydrocephalus. Basilar cisterns are patent. There is extensive cortical atrophy. There is moderate periventricular white matter hypodensities. Proportional ventricular dilatation to atrophy. Mastoid air cells are clear. There is no air-fluid level within the RIGHT maxillary sinus. IMPRESSION: 1. No acute intracranial findings. 2. Significant atrophy and white matter microvascular disease unchanged. 3. Fluid level within the LEFT  maxillary sinus consistent with sinusitis. Electronically Signed   By: Suzy Bouchard M.D.   On: 12/31/2015 15:53    EKG: (Independently reviewed) 100% ventricular paced rhythm with underlying incomplete right bundle branch block without obvious ischemic changes  Assessment/Plan Principal Problem:  Respiratory failure secondary to:   A) Acute on chronic combined systolic and diastolic heart failure, NYHA class 2 /severe pulmonary hypertension/moderate tricuspid regurgitation   B) Aspiration pneumonia -Multifactorial etiology to respiratory failure: Heart failure in setting of patient on longer receiving Lasix with anasarca and elevated BNP -Also appears to have recurrent aspiration therefore aspiration pneumonitis also in differential-Procalcitonin 0.51 -No ACE inhibitor secondary to chronic kidney disease; was not on beta blocker prior to admission and will hold given acute heart failure exacerbation -Previously was on 80 mg of oral Lasix twice a day and will utilize Lasix 80 mg IV every 12 hours initially -Daily weights and strict intake and output -Echocardiogram last admission in April: EF 30-35% with normal wall thickness, moderate TR with moderate to severe pulmonary hypertension and although study not technically sufficient to allow for evaluation of diastolic dysfunction or urinary hypertension with  moderate TR highly suggestive of diastolic dysfunction  Active Problems:   Dysphagia -Evaluated by speech therapy previous admission with recommendations for modifications in diet -Patient seemed prefer and tolerated the liquid type diet primarily with applesauce's, puddings and yogurt -Repeat swallowing evaluation      CKD (chronic kidney disease), stage III -Baseline renal function BUN 49 and creatinine 1.27 at discharge and today's renal function BUN 29 and creatinine 1.56    Debilitated -Patient appears to be experiencing progressive decline over the past 6-12 months worse over the past 3 months and now is presenting for readmission after being discharged 1 week ago from rehabilitation -Palliative spoke with wife previous admission and outpatient hospice evaluation was pending; I have reconsulted palliative and suspect at best patient will go to skilled nursing but likely needs to be transitioned to comfort -Currently a partial code while inpatient but full DO NOT RESUSCITATE as an outpatient-wife needs the DO NOT RESUSCITATE form to go home with her if patient gets discharged to home with home health hospice which is its preference    PAF (paroxysmal atrial fibrillation)  -Currently 100% ventricular paced -On Xarelto prior to admission and will continue for now -CHADVASC = 4 -Continue digoxin; check digoxin level    Leukocytosis -Likely related to low perfusion in setting of heart failure as well as recurrent aspiration pneumonitis -Mild elevation Procalcitonin-low-transverse    Biventricular cardiac pacemaker in situ    Anemia of chronic kidney failure -Hemoglobin slightly higher than baseline of 11.5 as of 5/10 today's hemoglobin is 12.9    History of GI bleed -No signs of bleeding since discharge to home according to wife    Prediabetes -hGBA1c was 5.7 previous admission      DVT prophylaxis:  Xarelto Code Status: Partial code-okay for elective cardioversion but do  not defibrillate; no CPR, no intubation, no mechanical ventilation Family Communication: Wife, and [place] at bedside Disposition Plan: Pending discussion with palliative medicine patient will either discharged to skilled nursing facility or go home with home health hospice Consults called: Palliative medicine on call  Admission status: Inpatient/telemetry    ELLIS,ALLISON L. ANP-BC [facility] Pager [phone]   If 7PM-7AM, please contact night-coverage [email or web address] Password [facility]  12/31/2015, 5:26 PM

## 2015-12-31 NOTE — ED Provider Notes (Signed)
CSN: [id number]     Arrival date & time 12/31/15  1057 History   First MD Initiated Contact with Patient 12/31/15 1114     Chief Complaint  Patient presents with  . Choking  . Fatigue     (Consider location/radiation/quality/duration/timing/severity/associated sxs/prior Treatment) HPI Level V caveat. Patient non-verbal (baseline since recent hospital discharge. History is provided by the patient's wife who is his caregiver. I he is an 82 year old male with history of atrial fibrillation on Xarelto, coronary artery disease status post CABG and pacemaker placement, CKD, hypertension, diabetes. Recently hospitalized this month for upper GI bleed while on Coumadin. Had UTI during hospitalization. Discharged 1 week ago. Since discharge, patient wife states that he has been fatigued, debilitated. Patient had many visitors yesterday, and appeared to be more worn out at the end of the day. Has primarily been bedbound. Appears to be more somnolent and tired, and has had issues swallowing his pills and eating applesauce. States that he appeared to have choked on the applesauce, and had some noisy breathing. History of dysphagia but this is worse than normal. EMS was subsequently called. He has not had fever, nausea or vomiting, cough, diarrhea. He is at his baseline mental status since discharge from the hospital per his wife.  Past Medical History  Diagnosis Date  . Atrial fibrillation (HCC)     Chronic  . Coronary artery disease 2004    Status post CABG with LIMA-LAD, SVG-CFX. Hx of anterior apical infarct.  . Renal insufficiency     Chronic  . Hypertension   . Hyperlipidemia   . Gout   . CHF (congestive heart failure) (HCC)     with chronic ischemic cardiomypathy. EF of 10-15%.  CHF due to systolic dysfunction. Clinically doing well.  [name] GERD (gastroesophageal reflux disease)   . Pacemaker     Medtronic biventricular pacemaker, serial number [id number]  . H/O hiatal hernia   . Arthritis    "right thumb" (03/20/2013)  . Depression   . Anxiety   . Bladder cancer ([place])     "low grade; grade 1; non-invasive" (03/20/2013)  . CKD (chronic kidney disease), stage III   . Diabetes mellitus without complication [facility])    Past Surgical History  Procedure Laterality Date  . Transurethral resection of prostate  1990's; 2004    "twice; injected chemo 1st time; didn't do that 2nd time" (03/20/2013)  . Nasal septum surgery    . US echocardiography  04-17-2009    Est EF 10-15%  . Cardiovascular stress test  04-17-2009    EF 29%  . Coronary artery bypass graft  02/19/2003    LIMA-LAD, SVG-CFX  . Carotid endarterectomy Right 1990's    "left side is still  100% blocked" (03/20/2013)  . Icd lead removal Left 03/01/2013    Procedure: ICD LEAD REMOVAL;  Surgeon: Evans Lance, MD;  Location: [place];  Service: Cardiovascular;  Laterality: Left;  . Pacemaker lead removal Left 03/01/2013    Procedure: PACEMAKER LEAD REMOVAL;  Surgeon: Evans Lance, MD;  Location: [place];  Service: Cardiovascular;  Laterality: Left;  . Pacemaker insertion N/A 03/01/2013    Procedure: INSERTION PACEMAKER LEAD;  Surgeon: Evans Lance, MD;  Location: [facility];  Service: Cardiovascular;  Laterality: N/A;  . Hemorrhoid surgery      "lanced several years ago" (03/20/2013)  . Tonsillectomy and adenoidectomy  1933  . Cataract extraction w/ intraocular lens  implant, bilateral Bilateral ~ 2000  . Bi-ventricular pacemaker insertion N/A  03/20/2013    Procedure: BI-VENTRICULAR PACEMAKER INSERTION (CRT-P);  Surgeon: Evans Lance, MD; Medtronic biventricular pacemaker, serial number [id number]; Laterality: Right  . Flexible sigmoidoscopy N/A 12/05/2015    Procedure: FLEXIBLE SIGMOIDOSCOPY;  Surgeon: [name], MD;  Location: [place];  Service: Endoscopy;  Laterality: N/A;   Family History  Problem Relation Age of Onset  . Heart attack Mother    Social History  Substance Use Topics  . Smoking status: Former Smoker -- 1.00  packs/day for 17 years    Types: Cigarettes    Quit date: 02/28/1960  . Smokeless tobacco: Never Used  . Alcohol Use: Yes     Comment: 03/20/2013 "been about 2-3 months since I've had alcohol; never been a heavy drinker; was 1-2 drinks before dinner probably 5 nights/wk"    Review of Systems 10/14 systems reviewed and are negative other than those stated in the HPI    Allergies  Penicillins  Home Medications   Prior to Admission medications   Medication Sig Start Date End Date Taking? Authorizing Provider  acetaminophen (TYLENOL) 325 MG tablet Take 2 tablets (650 mg total) by mouth every 6 (six) hours as needed for mild pain, moderate pain, fever or headache. 12/15/15  Yes Robbie Lis, MD  allopurinol (ZYLOPRIM) 300 MG tablet Take 1 tablet (300 mg total) by mouth daily. 12/24/15  Yes Daniel J Angiulli, PA-C  ALPRAZolam Duanne Moron) 0.5 MG tablet Take 1 mg by mouth at bedtime.  04/07/11  Yes Historical Provider, MD  calcium-vitamin D (OSCAL WITH D) 500-200 MG-UNIT per tablet Take 1 tablet by mouth daily.    Yes Historical Provider, MD  digoxin (LANOXIN) 0.125 MG tablet Take 0.5 tablets (0.0625 mg total) by mouth daily. 12/24/15  Yes Daniel J Angiulli, PA-C  divalproex (DEPAKOTE) 250 MG DR tablet Take 3 tablets (750 mg total) by mouth every 12 (twelve) hours. 12/24/15  Yes Daniel J Angiulli, PA-C  loratadine (CLARITIN) 10 MG tablet Take 1 tablet (10 mg total) by mouth daily. 12/24/15  Yes Daniel J Angiulli, PA-C  lovastatin (MEVACOR) 20 MG tablet Take 1 tablet (20 mg total) by mouth at bedtime. Reported on 11/24/2015 12/24/15  Yes Daniel J Angiulli, PA-C  multivitamin-lutein (OCUVITE-LUTEIN) CAPS Take 1 capsule by mouth daily.   Yes Historical Provider, MD  Naphazoline HCl (CLEAR EYES OP) Place 1 drop into both eyes daily as needed (irritation/ eye drop).   Yes Historical Provider, MD  OVER THE COUNTER MEDICATION Place 1 patch onto the skin as directed. Wound Care that patients wife gets from [place]. The patient has on a patch that is supposed to help heal his bed sore it is located on buttocks.   Yes Historical Provider, MD  oxyCODONE (OXY IR/ROXICODONE) 5 MG immediate release tablet Take 1 tablet (5 mg total) by mouth every 4 (four) hours as needed for moderate pain. 12/24/15  Yes Daniel J Angiulli, PA-C  Rivaroxaban (XARELTO) 15 MG TABS tablet Take 1 tablet (15 mg total) by mouth daily. 12/24/15  Yes Daniel J Angiulli, PA-C  vitamin B-12 (CYANOCOBALAMIN) 1000 MCG tablet Take 1,000 mcg by mouth daily.   Yes Historical Provider, MD   BP 122/63 mmHg  Pulse 98  Temp(Src) 97.7 F (36.5 C) (Oral)  Resp 26  Wt 115 lb (52.164 kg)  SpO2 98% Physical Exam Physical Exam  Nursing note and vitals reviewed. Constitutional: Frail, cachectic, non-toxic, and in no acute distress Head: Normocephalic and atraumatic.  Mouth/Throat: Oropharynx is clear and  dry.  Neck: Normal range of motion. Neck supple.  Cardiovascular: Normal rate and regular rhythm. No edema Pulmonary/Chest: Effort normal and breath sounds mildly coarse.  Abdominal: Soft. There is no tenderness. There is no rebound and no guarding.  Musculoskeletal: Normal range of motion.  Neurological: Alert, moves all extremities symmetrically to command, no facial droop Skin: Skin is warm and dry.  Psychiatric: Cooperative  ED Course  Procedures (including critical care time) Labs Review Labs Reviewed  CBC - Abnormal; Notable for the following:    WBC 19.1 (*)    RBC 4.03 (*)    Hemoglobin 12.9 (*)    RDW 20.6 (*)    All other components within normal limits  URINALYSIS, ROUTINE W REFLEX MICROSCOPIC (NOT AT [facility]) - Abnormal; Notable for the following:    Hgb urine dipstick MODERATE (*)    All other components within normal limits  PROTIME-INR - Abnormal; Notable for the following:    Prothrombin Time 29.2 (*)    INR 2.81 (*)    All other components within normal limits  DIFFERENTIAL - Abnormal; Notable for the following:     Neutro Abs 14.7 (*)    Lymphs Abs 0.6 (*)    Monocytes Absolute 3.2 (*)    All other components within normal limits  COMPREHENSIVE METABOLIC PANEL - Abnormal; Notable for the following:    Potassium 5.3 (*)    BUN 29 (*)    Creatinine, Ser 1.56 (*)    Calcium 8.1 (*)    Total Protein 6.3 (*)    Albumin 2.4 (*)    Alkaline Phosphatase 182 (*)    GFR calc non Af Amer 38 (*)    GFR calc Af Amer 44 (*)    All other components within normal limits  URINE MICROSCOPIC-ADD ON - Abnormal; Notable for the following:    Squamous Epithelial / LPF 0-5 (*)    Bacteria, UA RARE (*)    All other components within normal limits  BRAIN NATRIURETIC PEPTIDE - Abnormal; Notable for the following:    B Natriuretic Peptide 2119.6 (*)    All other components within normal limits  PROCALCITONIN    Imaging Review Dg Chest 2 View  12/31/2015  CLINICAL DATA:  Hypertension with atrial fibrillation. Concern for aspiration EXAM: CHEST  2 VIEW COMPARISON:  Dec 17, 2015 FINDINGS: There are bilateral pleural effusions. There is no edema or consolidation evident. Heart is mildly enlarged with pulmonary vascularity within normal limits. Pacemaker lead positions are unchanged. No adenopathy. Patient is status post coronary artery bypass grafting. No bone lesions evident. There is evidence of old trauma involving mid left clavicle. IMPRESSION: Bilateral pleural effusions with cardiomegaly. Suspect a degree of congestive heart failure. No airspace consolidation evident. Stable cardiac silhouette. Electronically Signed   By: Lowella Grip III M.D.   On: 12/31/2015 12:44   Ct Head Wo Contrast  12/31/2015  CLINICAL DATA:  Altered mental status, headache and confusion EXAM: CT HEAD WITHOUT CONTRAST TECHNIQUE: Contiguous axial images were obtained from the base of the skull through the vertex without intravenous contrast. COMPARISON:  None. FINDINGS: No acute intracranial hemorrhage. No focal mass lesion. No CT evidence  of acute infarction. No midline shift or mass effect. No hydrocephalus. Basilar cisterns are patent. There is extensive cortical atrophy. There is moderate periventricular white matter hypodensities. Proportional ventricular dilatation to atrophy. Mastoid air cells are clear. There is no air-fluid level within the RIGHT maxillary sinus. IMPRESSION: 1. No acute intracranial findings. 2. Significant  atrophy and white matter microvascular disease unchanged. 3. Fluid level within the LEFT maxillary sinus consistent with sinusitis. Electronically Signed   By: Suzy Bouchard M.D.   On: 12/31/2015 15:53   I have personally reviewed and evaluated these images and lab results as part of my medical decision-making.   EKG Interpretation   Date/Time:  Thursday Dec 31 2015 11:09:21 EDT Ventricular Rate:  89 PR Interval:    QRS Duration: 117 QT Interval:  343 QTC Calculation: 417 R Axis:   26 Text Interpretation:  Accelerated junctional rhythm Incomplete right  bundle branch block Left ventricular hypertrophy Baseline wander in  lead(s) I II aVR ventricularly paced rhythm, unchanged from prior EKG   Confirmed by Demeco Ducksworth MD, Hinton Dyer [id number]) on 12/31/2015 1:50:15 PM      MDM   Final diagnoses:  AKI (acute kidney injury) ([place])  Aspiration pneumonia due to regurgitated food, unspecified laterality, unspecified part of lung ([place])    82 year old male who presents with weakness and concern for choking episode. He is afebrile, hemodynamically stable, but chronically ill-appearing and frail. Grossly neurologically intact, overall non-verbal, but does not or shake his head appropriately to answers and obey commands. He is on room air, breathing comfortably, chest x-ray without aspiration or pneumonia but question of occult aspiration pneumonia as he does have leukocytosis with bandemia. UA without infection. With AKI as well and some mild edema on CXR. C/f component of heart failure given recent discontinuation of  lasix in the hospital. Did cover for aspiration of levaquin and clindamycin. Discussed with NP Lissa Merlin and admitted to hospitalist service for ongoing management.    Forde Dandy, MD 12/31/15 [phone]

## 2016-01-01 ENCOUNTER — Inpatient Hospital Stay (HOSPITAL_COMMUNITY): Payer: Medicare Other

## 2016-01-01 DIAGNOSIS — Z950 Presence of cardiac pacemaker: Secondary | ICD-10-CM

## 2016-01-01 DIAGNOSIS — J9601 Acute respiratory failure with hypoxia: Secondary | ICD-10-CM

## 2016-01-01 DIAGNOSIS — Z7189 Other specified counseling: Secondary | ICD-10-CM

## 2016-01-01 DIAGNOSIS — Z515 Encounter for palliative care: Secondary | ICD-10-CM

## 2016-01-01 DIAGNOSIS — N183 Chronic kidney disease, stage 3 (moderate): Secondary | ICD-10-CM

## 2016-01-01 DIAGNOSIS — I5043 Acute on chronic combined systolic (congestive) and diastolic (congestive) heart failure: Secondary | ICD-10-CM

## 2016-01-01 DIAGNOSIS — N179 Acute kidney failure, unspecified: Secondary | ICD-10-CM

## 2016-01-01 LAB — PROTIME-INR
INR: 2.95 — AB (ref 0.00–1.49)
Prothrombin Time: 30.2 seconds — ABNORMAL HIGH (ref 11.6–15.2)

## 2016-01-01 LAB — CBC WITH DIFFERENTIAL/PLATELET
BASOS PCT: 0 %
Basophils Absolute: 0 10*3/uL (ref 0.0–0.1)
EOS PCT: 0 %
Eosinophils Absolute: 0 10*3/uL (ref 0.0–0.7)
HEMATOCRIT: 36 % — AB (ref 39.0–52.0)
HEMOGLOBIN: 11.3 g/dL — AB (ref 13.0–17.0)
LYMPHS ABS: 1.1 10*3/uL (ref 0.7–4.0)
Lymphocytes Relative: 4 %
MCH: 31.8 pg (ref 26.0–34.0)
MCHC: 31.4 g/dL (ref 30.0–36.0)
MCV: 101.4 fL — AB (ref 78.0–100.0)
MONO ABS: 2.5 10*3/uL — AB (ref 0.1–1.0)
MONOS PCT: 9 %
NEUTROS ABS: 23.9 10*3/uL — AB (ref 1.7–7.7)
Neutrophils Relative %: 87 %
Platelets: 130 10*3/uL — ABNORMAL LOW (ref 150–400)
RBC: 3.55 MIL/uL — ABNORMAL LOW (ref 4.22–5.81)
RDW: 21.2 % — AB (ref 11.5–15.5)
WBC Morphology: INCREASED
WBC: 27.5 10*3/uL — ABNORMAL HIGH (ref 4.0–10.5)

## 2016-01-01 LAB — BASIC METABOLIC PANEL
Anion gap: 14 (ref 5–15)
BUN: 31 mg/dL — ABNORMAL HIGH (ref 6–20)
CHLORIDE: 100 mmol/L — AB (ref 101–111)
CO2: 21 mmol/L — AB (ref 22–32)
CREATININE: 1.62 mg/dL — AB (ref 0.61–1.24)
Calcium: 7.4 mg/dL — ABNORMAL LOW (ref 8.9–10.3)
GFR calc non Af Amer: 36 mL/min — ABNORMAL LOW (ref >60)
GFR, EST AFRICAN AMERICAN: 42 mL/min — AB (ref >60)
GLUCOSE: 101 mg/dL — AB (ref 65–99)
Potassium: 6.5 mmol/L (ref 3.5–5.1)
Sodium: 135 mmol/L (ref 135–145)

## 2016-01-01 LAB — POTASSIUM: POTASSIUM: 4.9 mmol/L (ref 3.5–5.1)

## 2016-01-01 MED ORDER — MORPHINE SULFATE (PF) 2 MG/ML IV SOLN
1.0000 mg | INTRAVENOUS | Status: DC | PRN
  Administered 2016-01-01 – 2016-01-02 (×2): 2 mg via INTRAVENOUS
  Administered 2016-01-04: 1 mg via INTRAVENOUS
  Filled 2016-01-01 (×3): qty 1

## 2016-01-01 MED ORDER — SODIUM CHLORIDE 0.9 % IV BOLUS (SEPSIS)
500.0000 mL | Freq: Once | INTRAVENOUS | Status: AC
  Administered 2016-01-01: 500 mL via INTRAVENOUS

## 2016-01-01 MED ORDER — SODIUM CHLORIDE 0.9 % IV BOLUS (SEPSIS)
250.0000 mL | Freq: Once | INTRAVENOUS | Status: AC
  Administered 2016-01-01: 250 mL via INTRAVENOUS

## 2016-01-01 MED ORDER — ACETAMINOPHEN 650 MG RE SUPP: 650.0000 mg | Freq: Four times a day (QID) | RECTAL | Status: DC | PRN

## 2016-01-01 MED ORDER — VALPROATE SODIUM 500 MG/5ML IV SOLN
750.0000 mg | Freq: Two times a day (BID) | INTRAVENOUS | Status: DC
  Administered 2016-01-01 – 2016-01-05 (×9): 750 mg via INTRAVENOUS
  Filled 2016-01-01 (×10): qty 7.5

## 2016-01-01 MED ORDER — FUROSEMIDE 10 MG/ML IJ SOLN
40.0000 mg | Freq: Two times a day (BID) | INTRAMUSCULAR | Status: DC
  Filled 2016-01-01 (×3): qty 4

## 2016-01-01 MED ORDER — LORAZEPAM 2 MG/ML IJ SOLN
0.5000 mg | Freq: Four times a day (QID) | INTRAMUSCULAR | Status: DC | PRN
  Administered 2016-01-03 – 2016-01-05 (×3): 0.5 mg via INTRAVENOUS
  Filled 2016-01-01 (×3): qty 1

## 2016-01-01 NOTE — Clinical Social Work Note (Signed)
CSW acknowledges consult for SNF. Patient has not been seen by PT yet. Per MD in progression, patient not doing well today and is now DNR. Family has been called in. CSW will continue to monitor patient's progress.  [name], [place]

## 2016-01-01 NOTE — Consult Note (Signed)
SLP Cancellation Note  Patient Details Name: [name] MRN: [id number] DOB: Jul 04, 1925   Cancelled treatment:        Orders received for BSE. Chart review reveals recent MBS (12/17/15) during CIR stay. Pt with documented silent aspiration of thin, nectar, and puree consistencies. Palliative Care NP and RN in room at this time, and indicate pt not appropriate for evaluation at this time. NP was given written information regarding continuing po intake with known risk of aspiration, including strategies to mitigate aspiration. NP requested ST see over the weekend, to provide education and answer questions pt's wife may have, as she is not present at this time. ST to continue efforts.   Shonna Chock 01/01/2016, 10:07 AM  Enriqueta Shutter. [place], [place], [place]

## 2016-01-01 NOTE — Consult Note (Signed)
Consultation Note Date: 01/01/2016   Patient Name: [name]  DOB: July 03, 1925  MRN: [id number]  Age / Sex: 82 y.o., male  PCP: Leanna Battles, MD Referring Physician: Caren Griffins, MD  Reason for Consultation: Establishing goals of care, Hospice Evaluation, Non pain symptom management, Pain control and Psychosocial/spiritual support  HPI/Patient Profile: 82 y.o. male  with past medical history of CAD s/p CABG, CKD III, sCHF with EF 30-35%, bladder ca, recent GI bleed with prolonged hospitalization, sz d/o admitted on 12/31/2015 with debility, excessive fatigue, and choking episodes while eating. . On 5/19 pt's BP dropped to 66/47. He became minimally rep[sonsive. Dr. Letta Median calling spouse with update as to change in clinical status. Pt now DNR.   Clinical Assessment and Goals of Care: Pt is lethargic. States he doesn't feel well. He is c/o bilateral leg pain , left hurting morn than the right. He states this is not new pain, but is worse. Denies dyspnea. No increased work of breathing observed.  He tells me he feels terrrible. I asked him if he thought he was dying and he said "no". Pt appears as though he could be transitioning  NEXT OF KIN spouse, [name] Goodin    SUMMARY OF RECOMMENDATIONS   DNR/DNI No PEG In-pt hospice if he stabilizes; anticipating potentially hospital death Continue antibiotics while in the hospital. Understands that hospice will not provide IVF or IV antibiotics Repeat potassium 4.9, but would not want to treat hyperkalemia via NG or enema with kayexalate if he could not swallow Eat for comfort. Aware of the high aspiration risks and this will likely be the cause of his death Continue tx for a-fib and seizure d/o . DC'd digoxin for fear of toxicity in the setting of minimal po intake Code Status/Advance Care Planning:  DNR    Symptom Management:   Pain: Holding off on pain  meds until spouse gets here he appears so fragile; will start low dose MS04 IV 1-2 q3 prn  Palliative Prophylaxis:   Aspiration, Bowel Regimen, Delirium Protocol, Eye Care, Frequent Pain Assessment and Turn Reposition  Additional Recommendations (Limitations, Scope, Preferences):  Minimize Medications, Initiate Comfort Feeding, No Artificial Feeding, No Blood Transfusions, No Chemotherapy, No Hemodialysis, No Radiation, No Surgical Procedures and No Tracheostomy  Psycho-social/Spiritual:   Desire for further Chaplaincy support:no  Additional Recommendations: Grief/Bereavement Support  Prognosis:   Hours - Days if things were to continue to go as they have been, I would not be surprised in the setting of sepsis, leukocytosis 27.5, CAD s/p CABG, CKD III, sCHF 30-35 % EF, bladder cancer as well as protein calorie malnutrition.  Discharge Planning: Hospice facility      Primary Diagnoses: Present on Admission:  . Aspiration pneumonia ([place]) . Anemia of chronic kidney failure . (Resolved) Atrial fibrillation ([place]) . Biventricular cardiac pacemaker in situ . Acute on chronic combined systolic and diastolic heart failure, NYHA class 2 ([place]) . Prediabetes . PAF (paroxysmal atrial fibrillation) ([place]) . Leukocytosis . Debilitated . CKD (chronic kidney disease), stage III .  Acute respiratory failure with hypoxia ([place]) . Acute systolic congestive heart failure, NYHA class 2 ([place])  I have reviewed the medical record, interviewed the patient and family, and examined the patient. The following aspects are pertinent.  Past Medical History  Diagnosis Date  . Atrial fibrillation (HCC)     Chronic  . Coronary artery disease 2004    Status post CABG with LIMA-LAD, SVG-CFX. Hx of anterior apical infarct.  . Renal insufficiency     Chronic  . Hypertension   . Hyperlipidemia   . Gout   . CHF (congestive heart failure) (HCC)     with chronic ischemic cardiomypathy. EF of 10-15%.  CHF due  to systolic dysfunction. Clinically doing well.  [name] GERD (gastroesophageal reflux disease)   . Pacemaker     Medtronic biventricular pacemaker, serial number [id number]  . H/O hiatal hernia   . Arthritis     "right thumb" (03/20/2013)  . Depression   . Anxiety   . Bladder cancer ([place])     "low grade; grade 1; non-invasive" (03/20/2013)  . CKD (chronic kidney disease), stage III   . Diabetes mellitus without complication [facility])    Social History   Social History  . Marital Status: Married    Spouse Name: N/A  . Number of Children: 1  . Years of Education: N/A   Occupational History  . Retired    Social History Main Topics  . Smoking status: Former Smoker -- 1.00 packs/day for 17 years    Types: Cigarettes    Quit date: 02/28/1960  . Smokeless tobacco: Never Used  . Alcohol Use: Yes     Comment: 03/20/2013 "been about 2-3 months since I've had alcohol; never been a heavy drinker; was 1-2 drinks before dinner probably 5 nights/wk"  . Drug Use: No  . Sexual Activity: Not Currently   Other Topics Concern  . None   Social History Narrative   Lives with wife.   Family History  Problem Relation Age of Onset  . Heart attack Mother    Scheduled Meds: . ALPRAZolam  1 mg Oral QHS  . digoxin  0.0625 mg Oral Daily  . divalproex  750 mg Oral Q12H  . furosemide  80 mg Intravenous BID  . levofloxacin (LEVAQUIN) IV  750 mg Intravenous Q48H  . loratadine  10 mg Oral Daily  . pravastatin  20 mg Oral q1800  . sodium chloride  250 mL Intravenous Once  . sodium chloride flush  3 mL Intravenous Q12H   Continuous Infusions:  PRN Meds:.sodium chloride, acetaminophen, ondansetron (ZOFRAN) IV, sodium chloride flush Medications Prior to Admission:  Prior to Admission medications   Medication Sig Start Date End Date Taking? Authorizing Provider  acetaminophen (TYLENOL) 325 MG tablet Take 2 tablets (650 mg total) by mouth every 6 (six) hours as needed for mild pain, moderate pain, fever or  headache. 12/15/15  Yes Robbie Lis, MD  allopurinol (ZYLOPRIM) 300 MG tablet Take 1 tablet (300 mg total) by mouth daily. 12/24/15  Yes Daniel J Angiulli, PA-C  ALPRAZolam Duanne Moron) 0.5 MG tablet Take 1 mg by mouth at bedtime.  04/07/11  Yes Historical Provider, MD  calcium-vitamin D (OSCAL WITH D) 500-200 MG-UNIT per tablet Take 1 tablet by mouth daily.    Yes Historical Provider, MD  digoxin (LANOXIN) 0.125 MG tablet Take 0.5 tablets (0.0625 mg total) by mouth daily. 12/24/15  Yes Daniel J Angiulli, PA-C  divalproex (DEPAKOTE) 250 MG DR tablet Take 3 tablets (750  mg total) by mouth every 12 (twelve) hours. 12/24/15  Yes Daniel J Angiulli, PA-C  loratadine (CLARITIN) 10 MG tablet Take 1 tablet (10 mg total) by mouth daily. 12/24/15  Yes Daniel J Angiulli, PA-C  lovastatin (MEVACOR) 20 MG tablet Take 1 tablet (20 mg total) by mouth at bedtime. Reported on 11/24/2015 12/24/15  Yes Daniel J Angiulli, PA-C  multivitamin-lutein (OCUVITE-LUTEIN) CAPS Take 1 capsule by mouth daily.   Yes Historical Provider, MD  Naphazoline HCl (CLEAR EYES OP) Place 1 drop into both eyes daily as needed (irritation/ eye drop).   Yes Historical Provider, MD  OVER THE COUNTER MEDICATION Place 1 patch onto the skin as directed. Wound Care that patients wife gets from [place]. The patient has on a patch that is supposed to help heal his bed sore it is located on buttocks.   Yes Historical Provider, MD  oxyCODONE (OXY IR/ROXICODONE) 5 MG immediate release tablet Take 1 tablet (5 mg total) by mouth every 4 (four) hours as needed for moderate pain. 12/24/15  Yes Daniel J Angiulli, PA-C  Rivaroxaban (XARELTO) 15 MG TABS tablet Take 1 tablet (15 mg total) by mouth daily. 12/24/15  Yes Daniel J Angiulli, PA-C  vitamin B-12 (CYANOCOBALAMIN) 1000 MCG tablet Take 1,000 mcg by mouth daily.   Yes Historical Provider, MD   Allergies  Allergen Reactions  . Penicillins Hives and Itching    Has patient had a PCN reaction causing immediate  rash, facial/tongue/throat swelling, SOB or lightheadedness with hypotension: YES Has patient had a PCN reaction causing severe rash involving mucus membranes or skin necrosis: NO Has patient had a PCN reaction that required hospitalizationNO Has patient had a PCN reaction occurring within the last 10 years: NO If all of the above answers are "NO", then may proceed with Cephalosporin use.   Review of Systems  Unable to perform ROS: Acuity of condition    Physical Exam  Constitutional:  Cachetic elderly man, temporal wasting. Appears very frail acutely ill  HENT:  Head: Normocephalic and atraumatic.  Cardiovascular:  Irrg, distant  Pulmonary/Chest:  irrg but no tachypnea, no apnea  Neurological:  Lethargic but attempting to answer questions. Oriented to person, place and situation   Skin:  Cool, knees mottled. anasaraca  Psychiatric:  No acute distress  Nursing note and vitals reviewed.   Vital Signs: BP 64/47 mmHg  Pulse 90  Temp(Src) 97.9 F (36.6 C) (Oral)  Resp 18  Wt 51.619 kg (113 lb 12.8 oz)  SpO2 96% Pain Assessment: No/denies pain   Pain Score: 0-No pain   SpO2: SpO2: 96 % O2 Device:SpO2: 96 % O2 Flow Rate: .   IO: Intake/output summary:  Intake/Output Summary (Last 24 hours) at 01/01/16 [id number] Last data filed at 01/01/16 0900  Gross per 24 hour  Intake    740 ml  Output    800 ml  Net    -60 ml    LBM: Last BM Date:  (unable to rmembmer) Baseline Weight: Weight: 52.164 kg (115 lb) Most recent weight: Weight: 51.619 kg (113 lb 12.8 oz)     Palliative Assessment/Data:   Flowsheet Rows        Most Recent Value   Intake Tab    Referral Department  Hospitalist   Unit at Time of Referral  Med/Surg Unit   Date Notified  12/31/15   Palliative Care Type  Return patient Palliative Care   Reason for referral  Clarify Goals of Care, [place]  Date of Admission  12/31/15   Date first seen by Palliative Care  01/01/16   # of days  Palliative referral response time  1 Day(s)   # of days IP prior to Palliative referral  0   Clinical Assessment    Palliative Performance Scale Score  30%   Pain Max last 24 hours  Not able to report   Pain Min Last 24 hours  Not able to report   Dyspnea Max Last 24 Hours  Not able to report   Dyspnea Min Last 24 hours  Not able to report   Nausea Max Last 24 Hours  Not able to report   Nausea Min Last 24 Hours  Not able to report   Anxiety Max Last 24 Hours  Not able to report   Anxiety Min Last 24 Hours  Not able to report   Other Max Last 24 Hours  Not able to report   Psychosocial & Spiritual Assessment    Palliative Care Outcomes    Palliative Care Outcomes  Clarified goals of care   Palliative Care follow-up planned  Yes, Facility      Time In: 0930 Time Out: 1100 Time Total: 90 min Greater than 50%  of this time was spent counseling and coordinating care related to the above assessment and plan. Staffed with Dr. Letta Median  Signed by: Dory Horn, NP   Please contact Palliative Medicine Team phone at [phone] for questions and concerns.  For individual provider: See Shea Evans

## 2016-01-01 NOTE — Consult Note (Signed)
   [facility] CM Inpatient Consult   01/01/2016  GILES SHAYNE 10/01/1924 [id number]  Patient screened for a re-admission with HX of chronic systolic heart failure with an EF of 30-35%  Patient recently discharged home from [facility] and was discharged from rehabilitation on 5/11. Re-admitted from home with HF exacerbation. Patient currently not doing well per progression report and is a DNR.  Patient is eligible for [facility] Care Management.  Will follow for progression and disposition needs. Please place consult if needs for community care management.  Please contact:  Natividad Brood, RN BSN [facility] Liaison  [phone] business mobile phone Toll free office [phone]

## 2016-01-01 NOTE — Progress Notes (Signed)
PT Cancellation/Discharge Note  Patient Details Name: [name] MRN: [id number] DOB: 1924/09/23   Cancelled Treatment:    Reason Eval/Treat Not Completed: Medical issues which prohibited therapy. Noted continued decline (including hypotension). Discussed with RN and pt no longer appropriate for PT eval. PT signing off. Please re-order if patient's condition warrants PT intervention.   Del Wiseman 01/01/2016, 2:30 PM  Pager [phone]

## 2016-01-01 NOTE — Progress Notes (Signed)
PROGRESS NOTE  [name] [id number] DOB: 08-09-1925 DOA: 12/31/2015 PCP: Donnajean Lopes, MD   LOS: 1 day   Brief Narrative: 82 year old male with recent hospitalization secondary to GI bleed on warfarin and severe metabolic encephalopathy of non-STEMI, recently discharged from [place], presents with failure to thrive, generalized weakness, and choking on eating  Assessment & Plan: Principal Problem:   Acute on chronic combined systolic and diastolic heart failure, NYHA class 2 (HCC) Active Problems:   Biventricular cardiac pacemaker in situ   CKD (chronic kidney disease), stage III   Anemia of chronic kidney failure   Debilitated   PAF (paroxysmal atrial fibrillation) (HCC)   History of GI bleed   Prediabetes   Dysphagia   Leukocytosis   Aspiration pneumonia (HCC)   Acute respiratory failure with hypoxia (HCC)   AKI (acute kidney injury) ([place])   Acute systolic congestive heart failure, NYHA class 2 (HCC)   Pressure ulcer   Acute hypoxic respiratory failure secondary to Acute on chronic combined systolic and diastolic heart failure, NYHA class 2 /severe pulmonary hypertension/moderate tricuspid regurgitation and Aspiration pneumonia - Oxygen as needed, patient with significant fluid overload, diuresis limited by hypotension and renal failure - Echocardiogram last admission in April: EF 30-35% with normal wall thickness, moderate TR with moderate to severe pulmonary hypertension and although study not technically sufficient to allow for evaluation of diastolic dysfunction or urinary hypertension with moderate TR highly suggestive of diastolic dysfunction - No ACE inhibitor secondary to chronic kidney disease; was not on beta blocker prior to admission and will hold given acute heart failure exacerbation  Goals of care - Extensive discussion with patient's wife over the phone this morning as well as bedside later in the day. Patient has had several recent hospitalizations,  progressive decline and failure to thrive. He is now hypotensive in the setting of heart failure as well as possible aspiration pneumonia. He has declined significantly in the last few weeks and more so overnight. When I evaluated the patient this morning, he was hypotensive into the 60s to 80s, poorly responsive. Patient's wife tells me that he would not 1 resuscitation if his heart were to stop, and would want to avoid escalation of care such as ICU, central lines, would want to focus on "treating the treatable". We'll continue to provide antibiotics, diuresis if blood pressure allows antiseizure medications but avoid ICU. I discussed with her that his condition currently is likely terminal as has multiorgan system failure with heart failure, renal failure, hypoxia and he is severely malnourished. She understands this. I have consulted palliative care to further discuss these issues with the wife, I expect in-hospital death and if he survives would be a good candidate for residential hospice. It also appears that outpatient M.D. was trying to set up hospice as patient's wife was about to be contacted by a hospice facility today   Dysphagia - speech therapy to evaluate, currently appears to weak to participate  AKI on CKD (chronic kidney disease), stage III - His creatinine seems to be worsening currently, ongoing hypotension and likely to progress and become even worse  PAF (paroxysmal atrial fibrillation)  - Currently 100% ventricular paced - On Xarelto prior to admission and will continue for now - CHADVASC = 4  Leukocytosis - Likely related to low perfusion in setting of heart failure as well as recurrent aspiration pneumonitis - Mild elevation Procalcitonin-low-transverse  Biventricular cardiac pacemaker in situ  Anemia of chronic kidney failure - monitor   History of  GI bleed - No signs of bleeding since discharge to home according to wife  Prediabetes - hGBA1c was 5.7 previous  admission    DVT prophylaxis: Coumadin Code Status: DNR Family Communication: d/w wife extensively at bedside  Disposition Plan: To be determined   Consultants:   Palliative care   Procedures:   None   Antimicrobials:  Levaquin 5/18 >>  Subjective: - Poorly responsive this morning, intermittently wakes up and tells me he doesn't feel good however cannot elaborate. Appears confused.   Objective: Filed Vitals:   01/01/16 0448 01/01/16 0857 01/01/16 1000 01/01/16 1018  BP:  64/47 64/44 98/58   Pulse: 90   92  Temp: 97.9 F (36.6 C)   98.2 F (36.8 C)  TempSrc: Oral   Rectal  Resp:    18  Weight: 51.619 kg (113 lb 12.8 oz)     SpO2: 96%   100%    Intake/Output Summary (Last 24 hours) at 01/01/16 1334 Last data filed at 01/01/16 0900  Gross per 24 hour  Intake    740 ml  Output    800 ml  Net    -60 ml   Filed Weights   12/31/15 1109 12/31/15 1812 01/01/16 0448  Weight: 52.164 kg (115 lb) 51.4 kg (113 lb 5.1 oz) 51.619 kg (113 lb 12.8 oz)    Examination: Constitutional: cachectic appearing elderly male Filed Vitals:   01/01/16 0448 01/01/16 0857 01/01/16 1000 01/01/16 1018  BP:  64/47 64/44 98/58   Pulse: 90   92  Temp: 97.9 F (36.6 C)   98.2 F (36.8 C)  TempSrc: Oral   Rectal  Resp:    18  Weight: 51.619 kg (113 lb 12.8 oz)     SpO2: 96%   100%   ENMT: Mucous membranes are dry. Respiratory:  coarse breath sounds bilaterally, no wheezing, bibasilar crackles  Cardiovascular:  regular, 2+ pitting lower and upper extremity edema, no murmurs heard  Abdomen: no tenderness. Bowel sounds positive.  Musculoskeletal: no clubbing / cyanosis. No contractures. Decreased muscle tone.  Neurologic:  not following commands consistently    Data Reviewed: I have personally reviewed following labs and imaging studies  CBC:  Recent Labs Lab 12/31/15 1133 01/01/16 0334  WBC 19.1* 27.5*  NEUTROABS 14.7* 23.9*  HGB 12.9* 11.3*  HCT 39.2 36.0*  MCV 97.3 101.4*    PLT 170 [id number]   Basic Metabolic Panel:  Recent Labs Lab 12/31/15 1133 01/01/16 0334 01/01/16 0919  NA 136 135  --   K 5.3* 6.5* 4.9  CL 102 100*  --   CO2 23 21*  --   GLUCOSE 83 101*  --   BUN 29* 31*  --   CREATININE 1.56* 1.62*  --   CALCIUM 8.1* 7.4*  --    GFR: Estimated Creatinine Clearance: 23 mL/min (by C-G formula based on Cr of 1.62). Liver Function Tests:  Recent Labs Lab 12/31/15 1133  AST 40  ALT 18  ALKPHOS 182*  BILITOT 0.5  PROT 6.3*  ALBUMIN 2.4*   No results for input(s): LIPASE, AMYLASE in the last 168 hours. No results for input(s): AMMONIA in the last 168 hours. Coagulation Profile:  Recent Labs Lab 12/31/15 1303 01/01/16 0334  INR 2.81* 2.95*   Cardiac Enzymes: No results for input(s): CKTOTAL, CKMB, CKMBINDEX, TROPONINI in the last 168 hours. BNP (last 3 results) No results for input(s): PROBNP in the last 8760 hours. HbA1C: No results for input(s): HGBA1C in the last 72 hours. CBG:  No results for input(s): GLUCAP in the last 168 hours. Lipid Profile: No results for input(s): CHOL, HDL, LDLCALC, TRIG, CHOLHDL, LDLDIRECT in the last 72 hours. Thyroid Function Tests: No results for input(s): TSH, T4TOTAL, FREET4, T3FREE, THYROIDAB in the last 72 hours. Anemia Panel: No results for input(s): VITAMINB12, FOLATE, FERRITIN, TIBC, IRON, RETICCTPCT in the last 72 hours. Urine analysis:    Component Value Date/Time   COLORURINE YELLOW 12/31/2015 [place] 12/31/2015 1322   LABSPEC 1.017 12/31/2015 1322   PHURINE 7.0 12/31/2015 1322   GLUCOSEU NEGATIVE 12/31/2015 1322   HGBUR MODERATE* 12/31/2015 1322   BILIRUBINUR NEGATIVE 12/31/2015 1322   KETONESUR NEGATIVE 12/31/2015 1322   PROTEINUR NEGATIVE 12/31/2015 1322   UROBILINOGEN 0.2 06/28/2014 1401   NITRITE NEGATIVE 12/31/2015 1322   LEUKOCYTESUR NEGATIVE 12/31/2015 1322   Sepsis Labs: Invalid input(s): PROCALCITONIN, LACTICIDVEN  Recent Results (from the past 240  hour(s))  Urine culture     Status: Abnormal   Collection Time: 12/28/15  1:00 PM  Result Value Ref Range Status   Specimen Description URINE, CLEAN CATCH  Final   Special Requests NONE  Final   Culture MULTIPLE SPECIES PRESENT, SUGGEST RECOLLECTION (A)  Final   Report Status 12/30/2015 FINAL  Final    Radiology Studies: Dg Chest 2 View  12/31/2015  CLINICAL DATA:  Hypertension with atrial fibrillation. Concern for aspiration EXAM: CHEST  2 VIEW COMPARISON:  Dec 17, 2015 FINDINGS: There are bilateral pleural effusions. There is no edema or consolidation evident. Heart is mildly enlarged with pulmonary vascularity within normal limits. Pacemaker lead positions are unchanged. No adenopathy. Patient is status post coronary artery bypass grafting. No bone lesions evident. There is evidence of old trauma involving mid left clavicle. IMPRESSION: Bilateral pleural effusions with cardiomegaly. Suspect a degree of congestive heart failure. No airspace consolidation evident. Stable cardiac silhouette. Electronically Signed   By: Lowella Grip III M.D.   On: 12/31/2015 12:44   Ct Head Wo Contrast  12/31/2015  CLINICAL DATA:  Altered mental status, headache and confusion EXAM: CT HEAD WITHOUT CONTRAST TECHNIQUE: Contiguous axial images were obtained from the base of the skull through the vertex without intravenous contrast. COMPARISON:  None. FINDINGS: No acute intracranial hemorrhage. No focal mass lesion. No CT evidence of acute infarction. No midline shift or mass effect. No hydrocephalus. Basilar cisterns are patent. There is extensive cortical atrophy. There is moderate periventricular white matter hypodensities. Proportional ventricular dilatation to atrophy. Mastoid air cells are clear. There is no air-fluid level within the RIGHT maxillary sinus. IMPRESSION: 1. No acute intracranial findings. 2. Significant atrophy and white matter microvascular disease unchanged. 3. Fluid level within the LEFT  maxillary sinus consistent with sinusitis. Electronically Signed   By: Suzy Bouchard M.D.   On: 12/31/2015 15:53   Dg Chest Port 1 View  01/01/2016  CLINICAL DATA:  Acute systolic congestive heart failure. EXAM: PORTABLE CHEST 1 VIEW COMPARISON:  12/31/2015 FINDINGS: Improved aeration in the lower chest is suggestive for decreasing pleural fluid. Heart size is within normal limits with median sternotomy wires. Again noted is a right-sided biventricular cardiac pacemaker. There is no evidence for pulmonary edema. No evidence for pneumothorax. Again noted is an old left clavicle fracture. IMPRESSION: Improved aeration in the lower chest suggests decreasing pleural effusions. No pulmonary edema. Electronically Signed   By: Markus Daft M.D.   On: 01/01/2016 07:38     Scheduled Meds: . furosemide  80 mg Intravenous BID  . levofloxacin (LEVAQUIN)  IV  750 mg Intravenous Q48H  . sodium chloride flush  3 mL Intravenous Q12H  . valproate sodium  750 mg Intravenous Q12H   Continuous Infusions:   Time spent: 45 minutes, greater than 50% involved in discussion/counseling with patient's wife regarding patient's wishes and goals of care   Marzetta Board, MD, PhD [facility] Pager [phone]  If 7PM-7AM, please contact night-coverage [email or web address] Password [email or web address] 01/01/2016, 1:34 PM

## 2016-01-01 NOTE — Progress Notes (Signed)
[facility]  Patient Status: Active (receiving services up to time of hospitalization)  [facility] is providing the following services: RN, PT, OT, ST, MSW and HHA  If patient discharges after hours, please call [phone].   [name] 01/01/2016, 10:27 AM

## 2016-01-02 LAB — BASIC METABOLIC PANEL
ANION GAP: 9 (ref 5–15)
BUN: 21 mg/dL — ABNORMAL HIGH (ref 6–20)
CALCIUM: 9.2 mg/dL (ref 8.9–10.3)
CO2: 27 mmol/L (ref 22–32)
CREATININE: 1.43 mg/dL — AB (ref 0.61–1.24)
Chloride: 107 mmol/L (ref 101–111)
GFR, EST AFRICAN AMERICAN: 49 mL/min — AB (ref >60)
GFR, EST NON AFRICAN AMERICAN: 42 mL/min — AB (ref >60)
GLUCOSE: 133 mg/dL — AB (ref 65–99)
Potassium: 3.9 mmol/L (ref 3.5–5.1)
Sodium: 143 mmol/L (ref 135–145)

## 2016-01-02 LAB — PROCALCITONIN: PROCALCITONIN: 0.64 ng/mL

## 2016-01-02 LAB — CBC
HCT: 39.5 % (ref 39.0–52.0)
HEMOGLOBIN: 12.3 g/dL — AB (ref 13.0–17.0)
MCH: 27.9 pg (ref 26.0–34.0)
MCHC: 31.1 g/dL (ref 30.0–36.0)
MCV: 89.6 fL (ref 78.0–100.0)
PLATELETS: 193 10*3/uL (ref 150–400)
RBC: 4.41 MIL/uL (ref 4.22–5.81)
RDW: 13.9 % (ref 11.5–15.5)
WBC: 5.8 10*3/uL (ref 4.0–10.5)

## 2016-01-02 NOTE — Progress Notes (Signed)
PROGRESS NOTE  [name] [id number] DOB: [birth year] DOA: 12/31/2015 PCP: Donnajean Lopes, MD   LOS: 2 days   Brief Narrative: 82 year old male with recent hospitalization secondary to GI bleed on warfarin and severe metabolic encephalopathy of non-STEMI, recently discharged from [place], presents with failure to thrive, generalized weakness, and choking on eating  Assessment & Plan: Principal Problem:   Acute on chronic combined systolic and diastolic heart failure, NYHA class 2 (HCC) Active Problems:   Biventricular cardiac pacemaker in situ   CKD (chronic kidney disease), stage III   Anemia of chronic kidney failure   Debilitated   PAF (paroxysmal atrial fibrillation) (HCC)   History of GI bleed   Prediabetes   Dysphagia   Leukocytosis   Aspiration pneumonia (HCC)   Acute respiratory failure with hypoxia (HCC)   AKI (acute kidney injury) ([place])   Acute systolic congestive heart failure, NYHA class 2 (HCC)   Pressure ulcer   Palliative care encounter   Acute hypoxic respiratory failure secondary to Acute on chronic combined systolic and diastolic heart failure, NYHA class 2 /severe pulmonary hypertension/moderate tricuspid regurgitation and Aspiration pneumonia - Oxygen as needed, patient with significant fluid overload, diuresis limited by hypotension and renal failure - Echocardiogram last admission in April: EF 30-35% with normal wall thickness, moderate TR with moderate to severe pulmonary hypertension and although study not technically sufficient to allow for evaluation of diastolic dysfunction or urinary hypertension with moderate TR highly suggestive of diastolic dysfunction - No ACE inhibitor secondary to chronic kidney disease; was not on beta blocker prior to admission and will hold given acute heart failure exacerbation  Goals of care - Extensive discussion with patient's wife on 5/19Patient has had several recent hospitalizations, progressive decline and failure to  thrive. He has declined significantly in the last few weeks and more so since he has been home. Patient's wife tells me that he would not want resuscitation if his heart were to stop, and would want to avoid escalation of care such as ICU, central lines, would want to focus on "treating the treatable". We'll continue to provide antibiotics, diuresis if blood pressure allows antiseizure medications but avoid ICU. I discussed with her that his condition currently is likely to be terminal as has multiorgan system failure with heart failure, renal failure, hypoxia and he is severely malnourished. She understands this. I have consulted palliative care to further discuss these issues with the wife.  Dysphagia - speech therapy consulted  AKI on CKD (chronic kidney disease), stage III - His creatinine is better today after IVF to correct hypotension yesterday  PAF (paroxysmal atrial fibrillation)  - Currently 100% ventricular paced - On Xarelto prior to admission and will continue for now - CHADVASC = 4  Leukocytosis - Likely related to low perfusion in setting of heart failure as well as recurrent aspiration pneumonitis - improved with antibiotics  Biventricular cardiac pacemaker in situ  Anemia of chronic kidney failure - monitor   History of GI bleed - No signs of bleeding since discharge to home according to wife  Prediabetes - hGBA1c was 5.7 previous admission    DVT prophylaxis: Coumadin Code Status: DNR Family Communication: no family bedside today  Disposition Plan: To be determined   Consultants:   Palliative care   Procedures:   None   Antimicrobials:  Levaquin 5/18 >>  Subjective: - more alert today, complains of swelling in his arms  Objective: Filed Vitals:   01/01/16 2110 01/02/16 0510 01/02/16 1000 01/02/16 1117  BP: 101/50 94/58 95/55  98/40  Pulse: 70 95 96 98  Temp: 98.9 F (37.2 C) 98.1 F (36.7 C)  98.4 F (36.9 C)  TempSrc: Oral Oral  Axillary    Resp: 18 16  18   Weight:  51.166 kg (112 lb 12.8 oz)    SpO2: 95% 98% 98% 98%    Intake/Output Summary (Last 24 hours) at 01/02/16 1443 Last data filed at 01/02/16 0744  Gross per 24 hour  Intake    220 ml  Output    850 ml  Net   -630 ml   Filed Weights   12/31/15 1812 01/01/16 0448 01/02/16 0510  Weight: 51.4 kg (113 lb 5.1 oz) 51.619 kg (113 lb 12.8 oz) 51.166 kg (112 lb 12.8 oz)    Examination: Constitutional: cachectic appearing elderly male Filed Vitals:   01/01/16 2110 01/02/16 0510 01/02/16 1000 01/02/16 1117  BP: 101/50 94/58 95/55  98/40  Pulse: 70 95 96 98  Temp: 98.9 F (37.2 C) 98.1 F (36.7 C)  98.4 F (36.9 C)  TempSrc: Oral Oral  Axillary  Resp: 18 16  18   Weight:  51.166 kg (112 lb 12.8 oz)    SpO2: 95% 98% 98% 98%   ENMT: Mucous membranes are dry. Respiratory:  coarse breath sounds bilaterally, no wheezing, bibasilar crackles  Cardiovascular:  regular, 2+ pitting lower and upper extremity edema, no murmurs heard  Abdomen: no tenderness. Bowel sounds positive.  Musculoskeletal: no clubbing / cyanosis. No contractures. Decreased muscle tone.  Neurologic:  not following commands consistently    Data Reviewed: I have personally reviewed following labs and imaging studies  CBC:  Recent Labs Lab 12/31/15 1133 01/01/16 0334 01/02/16 0432  WBC 19.1* 27.5* 5.8  NEUTROABS 14.7* 23.9*  --   HGB 12.9* 11.3* 12.3*  HCT 39.2 36.0* 39.5  MCV 97.3 101.4* 89.6  PLT 170 130* 0000000   Basic Metabolic Panel:  Recent Labs Lab 12/31/15 1133 01/01/16 0334 01/01/16 0919 01/02/16 0432  NA 136 135  --  143  K 5.3* 6.5* 4.9 3.9  CL 102 100*  --  107  CO2 23 21*  --  27  GLUCOSE 83 101*  --  133*  BUN 29* 31*  --  21*  CREATININE 1.56* 1.62*  --  1.43*  CALCIUM 8.1* 7.4*  --  9.2   GFR: Estimated Creatinine Clearance: 25.9 mL/min (by C-G formula based on Cr of 1.43). Liver Function Tests:  Recent Labs Lab 12/31/15 1133  AST 40  ALT 18  ALKPHOS  182*  BILITOT 0.5  PROT 6.3*  ALBUMIN 2.4*   No results for input(s): LIPASE, AMYLASE in the last 168 hours. No results for input(s): AMMONIA in the last 168 hours. Coagulation Profile:  Recent Labs Lab 12/31/15 1303 01/01/16 0334  INR 2.81* 2.95*   Cardiac Enzymes: No results for input(s): CKTOTAL, CKMB, CKMBINDEX, TROPONINI in the last 168 hours. BNP (last 3 results) No results for input(s): PROBNP in the last 8760 hours. HbA1C: No results for input(s): HGBA1C in the last 72 hours. CBG: No results for input(s): GLUCAP in the last 168 hours. Lipid Profile: No results for input(s): CHOL, HDL, LDLCALC, TRIG, CHOLHDL, LDLDIRECT in the last 72 hours. Thyroid Function Tests: No results for input(s): TSH, T4TOTAL, FREET4, T3FREE, THYROIDAB in the last 72 hours. Anemia Panel: No results for input(s): VITAMINB12, FOLATE, FERRITIN, TIBC, IRON, RETICCTPCT in the last 72 hours. Urine analysis:    Component Value Date/Time   COLORURINE YELLOW 12/31/2015 1322  APPEARANCEUR CLEAR 12/31/2015 1322   LABSPEC 1.017 12/31/2015 1322   PHURINE 7.0 12/31/2015 1322   GLUCOSEU NEGATIVE 12/31/2015 1322   HGBUR MODERATE* 12/31/2015 1322   BILIRUBINUR NEGATIVE 12/31/2015 1322   KETONESUR NEGATIVE 12/31/2015 1322   PROTEINUR NEGATIVE 12/31/2015 1322   UROBILINOGEN 0.2 06/28/2014 1401   NITRITE NEGATIVE 12/31/2015 1322   LEUKOCYTESUR NEGATIVE 12/31/2015 1322   Sepsis Labs: Invalid input(s): PROCALCITONIN, LACTICIDVEN  Recent Results (from the past 240 hour(s))  Urine culture     Status: Abnormal   Collection Time: 12/28/15  1:00 PM  Result Value Ref Range Status   Specimen Description URINE, CLEAN CATCH  Final   Special Requests NONE  Final   Culture MULTIPLE SPECIES PRESENT, SUGGEST RECOLLECTION (A)  Final   Report Status 12/30/2015 FINAL  Final    Radiology Studies: Ct Head Wo Contrast  12/31/2015  CLINICAL DATA:  Altered mental status, headache and confusion EXAM: CT HEAD WITHOUT  CONTRAST TECHNIQUE: Contiguous axial images were obtained from the base of the skull through the vertex without intravenous contrast. COMPARISON:  None. FINDINGS: No acute intracranial hemorrhage. No focal mass lesion. No CT evidence of acute infarction. No midline shift or mass effect. No hydrocephalus. Basilar cisterns are patent. There is extensive cortical atrophy. There is moderate periventricular white matter hypodensities. Proportional ventricular dilatation to atrophy. Mastoid air cells are clear. There is no air-fluid level within the RIGHT maxillary sinus. IMPRESSION: 1. No acute intracranial findings. 2. Significant atrophy and white matter microvascular disease unchanged. 3. Fluid level within the LEFT maxillary sinus consistent with sinusitis. Electronically Signed   By: Suzy Bouchard M.D.   On: 12/31/2015 15:53   Dg Chest Port 1 View  01/01/2016  CLINICAL DATA:  Acute systolic congestive heart failure. EXAM: PORTABLE CHEST 1 VIEW COMPARISON:  12/31/2015 FINDINGS: Improved aeration in the lower chest is suggestive for decreasing pleural fluid. Heart size is within normal limits with median sternotomy wires. Again noted is a right-sided biventricular cardiac pacemaker. There is no evidence for pulmonary edema. No evidence for pneumothorax. Again noted is an old left clavicle fracture. IMPRESSION: Improved aeration in the lower chest suggests decreasing pleural effusions. No pulmonary edema. Electronically Signed   By: Markus Daft M.D.   On: 01/01/2016 07:38     Scheduled Meds: . furosemide  40 mg Intravenous BID  . levofloxacin (LEVAQUIN) IV  750 mg Intravenous Q48H  . sodium chloride flush  3 mL Intravenous Q12H  . valproate sodium  750 mg Intravenous Q12H   Continuous Infusions:   Marzetta Board, MD, PhD [facility] Pager [phone]  If 7PM-7AM, please contact night-coverage [email or web address] Password [facility] 01/02/2016, 2:43 PM

## 2016-01-02 NOTE — Progress Notes (Addendum)
Pt is alert to self (can tell  his name,date of birth), place (can tell he is in ) .Marland KitchenBp running in lower side, Lasix is in hold, IV meds continue, oxygen continue via [place], suction continue at bed side, appetite is very low, risk of aspiration (choking on eating), will continue to monitor the patient.

## 2016-01-02 NOTE — Progress Notes (Signed)
Daily Progress Note   Patient Name: [name]       Date: 01/02/2016 DOB: [birth year]  Age: 82 y.o. MRN#: [id number] Attending Physician: Caren Griffins, MD Primary Care Physician: Donnajean Lopes, MD Admit Date: 12/31/2015  Reason for Consultation/Follow-up: Establishing goals of care, Non pain symptom management, Pain control and Psychosocial/spiritual support  Subjective: Patient is more alert this morning. He is conversational. Content and thought processes organized and within normal limits. He denies any shortness of breath. He is reporting intermittent bilateral leg pain with the left leg hurting more than the right. He states it comes and goes. He describes the pain as coming on suddenly as burning and radiating from his foot up to his groin. Minimal PRN's noted per review of MAR. White blood cell count this morning within normal limits. Spouse is not at the bedside  Length of Stay: 2  Current Medications: Scheduled Meds:  . furosemide  40 mg Intravenous BID  . levofloxacin (LEVAQUIN) IV  750 mg Intravenous Q48H  . sodium chloride flush  3 mL Intravenous Q12H  . valproate sodium  750 mg Intravenous Q12H    Continuous Infusions:    PRN Meds: sodium chloride, acetaminophen, LORazepam, morphine injection, ondansetron (ZOFRAN) IV, sodium chloride flush  Physical Exam  Constitutional: He appears well-nourished.  elderly frail man; no distress. More alert today  HENT:  Head: Normocephalic and atraumatic.  Neck: Normal range of motion.  Cardiovascular:  Anasarca. Upper extremities weeping  Pulmonary/Chest: Effort normal.  Neurological: He is alert.  Oriented to place, situation. Was able to address accurately what was on the television in terms of current events  Skin:    Cool, extremities weeping  Psychiatric: He has a normal mood and affect.  Nursing note and vitals reviewed.           Vital Signs: BP 94/58 mmHg  Pulse 95  Temp(Src) 98.1 F (36.7 C) (Oral)  Resp 16  Wt 51.166 kg (112 lb 12.8 oz)  SpO2 98% SpO2: SpO2: 98 % O2 Device: O2 Device: Nasal Cannula O2 Flow Rate:    Intake/output summary:  Intake/Output Summary (Last 24 hours) at 01/02/16 0954 Last data filed at 01/02/16 0744  Gross per 24 hour  Intake    280 ml  Output   1250 ml  Net   -  970 ml   LBM: Last BM Date:  (unable to rmembmer) Baseline Weight: Weight: 52.164 kg (115 lb) Most recent weight: Weight: 51.166 kg (112 lb 12.8 oz)       Palliative Assessment/Data:    Flowsheet Rows        Most Recent Value   Intake Tab    Referral Department  Hospitalist   Unit at Time of Referral  Med/Surg Unit   Date Notified  12/31/15   Palliative Care Type  Return patient Palliative Care   Reason for referral  Clarify Goals of Care, Counsel Regarding Hospice   Date of Admission  12/31/15   Date first seen by Palliative Care  01/01/16   # of days Palliative referral response time  1 Day(s)   # of days IP prior to Palliative referral  0   Clinical Assessment    Palliative Performance Scale Score  30%   Pain Max last 24 hours  Not able to report   Pain Min Last 24 hours  Not able to report   Dyspnea Max Last 24 Hours  Not able to report   Dyspnea Min Last 24 hours  Not able to report   Nausea Max Last 24 Hours  Not able to report   Nausea Min Last 24 Hours  Not able to report   Anxiety Max Last 24 Hours  Not able to report   Anxiety Min Last 24 Hours  Not able to report   Other Max Last 24 Hours  Not able to report   Psychosocial & Spiritual Assessment    Palliative Care Outcomes    Palliative Care Outcomes  Clarified goals of care   Palliative Care follow-up planned  Yes, Facility      Patient Active Problem List   Diagnosis Date Noted  . Palliative care encounter   .  Aspiration pneumonia ([place]) 12/31/2015  . Acute respiratory failure with hypoxia ([place]) 12/31/2015  . Acute systolic congestive heart failure, NYHA class 2 ([place]) 12/31/2015  . Pressure ulcer 12/31/2015  . AKI (acute kidney injury) ([place])   . Acute deep vein thrombosis (DVT) of axillary vein of right upper extremity ([place])   . Ischemia of finger   . PAF (paroxysmal atrial fibrillation) ([place])   . History of GI bleed   . Prediabetes   . Dysphagia   . Leukocytosis   . Acute on chronic combined systolic and diastolic heart failure, NYHA class 2 ([place]) 12/15/2015  . Anemia of chronic kidney failure 12/15/2015  . Debilitated 12/15/2015  . CKD (chronic kidney disease), stage III   . Biventricular cardiac pacemaker in situ 10/11/2011    Palliative Care Assessment & Plan   Patient Profile: Patient is an 82 year old man with a medical history significant for a recent protracted hospitalization for secondary to GI bleed, associated severe metabolic encephalopathy and non-STEMI. He has history of chronic systolic heart failure with an EF of 30-35%. Patient went from this hospital course to rehabilitation and was discharged from the rehabilitation facility on 5/11. His wife describes him as being progressively weaker not eating and was admitted to the hospital again on 12/31/2015. He has had difficulty maintaining his blood pressure and on 01/01/2016 his pressures dropped with systolic in the 0000000 and he required a fluid bolus. Goals of care discussion was held with wife and patient is trending towards comfort care with an order placed for residential hospice secondary to acute on chronic systolic heart failure with associated chronic kidney disease coronary artery  disease as well as bladder cancer. He is aspirating all consistencies and high likelihood of aspiration pneumonia developing  Assessment: Patient is more alert today; conversant no family at the bedside  Recommendations/Plan:  Pain: Continue with  morphine IV when necessary and monitor for need for scheduled dosing. Will add morphine concentrate as a oral alternative  Dyspnea: Improving with diuresis. Continue with targeted pulmonary treatments, oxygen as tolerated as well as opioids for acute management  Plan is for residential hospice when ready for discharge; continue antibiotics and IV fluids until that point  Goals of Care and Additional Recommendations:  Limitations on Scope of Treatment: Minimize Medications, Initiate Comfort Feeding, No Artificial Feeding, No Hemodialysis, No Lab Draws, No Surgical Procedures and No Tracheostomy  Code Status:    Code Status Orders        Start     Ordered   01/01/16 0918  Do not attempt resuscitation (DNR)   Continuous    Question Answer Comment  In the event of cardiac or respiratory ARREST Do not call a "code blue"   In the event of cardiac or respiratory ARREST Do not perform Intubation, CPR, defibrillation or ACLS   In the event of cardiac or respiratory ARREST Use medication by any route, position, wound care, and other measures to relive pain and suffering. May use oxygen, suction and manual treatment of airway obstruction as needed for comfort.      01/01/16 0917    Code Status History    Date Active Date Inactive Code Status Order ID Comments User Context   12/31/2015  6:11 PM 01/01/2016  9:17 AM Partial Code [id number]  Samella Parr, NP Inpatient   12/31/2015  3:54 PM 12/31/2015  6:11 PM Partial Code [id number]  Samella Parr, NP ED   12/16/2015  8:13 AM 12/24/2015  3:33 PM Partial Code [id number]  Acquanetta Chain, DO Inpatient   12/15/2015  4:49 PM 12/16/2015  8:13 AM Full Code [id number]  Cathlyn Parsons, PA-C Inpatient   12/12/2015  1:49 AM 12/15/2015  4:49 PM Full Code [id number]  Vianne Bulls, MD Inpatient   12/12/2015 12:08 AM 12/12/2015  1:49 AM Full Code [id number]  Vianne Bulls, MD Inpatient   12/11/2015  6:33 PM 12/12/2015 12:08 AM Full Code [id number]  Cathlyn Parsons,  PA-C Inpatient   12/11/2015  6:33 PM 12/11/2015  6:33 PM Full Code [id number]  Cathlyn Parsons, PA-C Inpatient   12/05/2015  5:53 AM 12/11/2015  6:33 PM Full Code [id number]  Bethena Roys, MD Inpatient   06/28/2014  4:13 PM 06/29/2014  4:40 PM Full Code [id number]  Marton Redwood, MD Inpatient   03/20/2013  4:42 PM 03/22/2013  3:04 PM Full Code [id number]  Andrez Grime, PA-C Inpatient       Prognosis:   < 4 weeks in the setting of acute on chronic systolic heart failure with an ejection fraction of 30-35%, chronic kidney disease stage III, bladder cancer ,coronary artery disease as well as severe dysphagia. Patient is aspirating all consistencies and development of aspiration pneumonia is likely in the very near future  Discharge Planning:  Hospice facility  Care plan was discussed with Dr. Letta Median  Thank you for allowing the Palliative Medicine Team to assist in the care of this patient.   Time In: 0915 Time Out: 0940 Total Time 25 min Prolonged Time Billed  no       Greater than 50%  of this time was spent counseling and  coordinating care related to the above assessment and plan.  Dory Horn, NP  Please contact Palliative Medicine Team phone at [phone] for questions and concerns.

## 2016-01-03 LAB — CBC
HEMATOCRIT: 36.9 % — AB (ref 39.0–52.0)
HEMOGLOBIN: 11.8 g/dL — AB (ref 13.0–17.0)
MCH: 32.2 pg (ref 26.0–34.0)
MCHC: 32 g/dL (ref 30.0–36.0)
MCV: 100.8 fL — AB (ref 78.0–100.0)
Platelets: 76 10*3/uL — ABNORMAL LOW (ref 150–400)
RBC: 3.66 MIL/uL — AB (ref 4.22–5.81)
RDW: 21.8 % — ABNORMAL HIGH (ref 11.5–15.5)
WBC: 40.7 10*3/uL — ABNORMAL HIGH (ref 4.0–10.5)

## 2016-01-03 LAB — BASIC METABOLIC PANEL
ANION GAP: 10 (ref 5–15)
BUN: 33 mg/dL — ABNORMAL HIGH (ref 6–20)
CHLORIDE: 104 mmol/L (ref 101–111)
CO2: 21 mmol/L — AB (ref 22–32)
CREATININE: 1.54 mg/dL — AB (ref 0.61–1.24)
Calcium: 7.5 mg/dL — ABNORMAL LOW (ref 8.9–10.3)
GFR calc non Af Amer: 39 mL/min — ABNORMAL LOW (ref >60)
GFR, EST AFRICAN AMERICAN: 45 mL/min — AB (ref >60)
Glucose, Bld: 159 mg/dL — ABNORMAL HIGH (ref 65–99)
POTASSIUM: 5.9 mmol/L — AB (ref 3.5–5.1)
Sodium: 135 mmol/L (ref 135–145)

## 2016-01-03 MED ORDER — MORPHINE SULFATE (CONCENTRATE) 10 MG/0.5ML PO SOLN
5.0000 mg | ORAL | Status: DC | PRN
  Administered 2016-01-03: 5 mg via ORAL
  Filled 2016-01-03: qty 0.5

## 2016-01-03 NOTE — Progress Notes (Signed)
Daily Progress Note   Patient Name: [name]       Date: 01/03/2016 DOB: [birth year]  Age: 82 y.o. MRN#: [id number] Attending Physician: Caren Griffins, MD Primary Care Physician: Donnajean Lopes, MD Admit Date: 12/31/2015  Reason for Consultation/Follow-up: Hospice Evaluation, Non pain symptom management, Pain control and Psychosocial/spiritual support  Subjective: Pt alert this am. Labs very irregular. WBC this am 40.7 vs. 5.8 5/20. Potassium 5.9 vs. 3.9 5/20. He is attempting to feed himself. Observed to cough and choke after each bite. Wife not at bedside  Length of Stay: 3  Current Medications: Scheduled Meds:  . furosemide  40 mg Intravenous BID  . levofloxacin (LEVAQUIN) IV  750 mg Intravenous Q48H  . sodium chloride flush  3 mL Intravenous Q12H  . valproate sodium  750 mg Intravenous Q12H    Continuous Infusions:    PRN Meds: sodium chloride, acetaminophen, LORazepam, morphine injection, ondansetron (ZOFRAN) IV, sodium chloride flush  Physical Exam  Constitutional: He is oriented to person, place, and time.  Frail cachetic elderly man. He is alert and in no acute distress  HENT:  Head: Normocephalic.  Neck: Normal range of motion.  Pulmonary/Chest: Effort normal.  Musculoskeletal: Normal range of motion.  Neurological: He is alert and oriented to person, place, and time.  Skin: Skin is warm.  Bruising around left orbital area  Psychiatric: He has a normal mood and affect.  Nursing note and vitals reviewed.           Vital Signs: BP 91/56 mmHg  Pulse 81  Temp(Src) 98.1 F (36.7 C) (Oral)  Resp 18  Ht 5\' 7"  (1.702 m)  Wt 50.304 kg (110 lb 14.4 oz)  BMI 17.37 kg/m2  SpO2 97% SpO2: SpO2: 97 % O2 Device: O2 Device: Nasal Cannula O2 Flow Rate:     Intake/output summary:  Intake/Output Summary (Last 24 hours) at 01/03/16 1014 Last data filed at 01/03/16 [id number]  Gross per 24 hour  Intake    440 ml  Output    650 ml  Net   -210 ml   LBM: Last BM Date: 12/31/15 Baseline Weight: Weight: 52.164 kg (115 lb) Most recent weight: Weight: 50.304 kg (110 lb 14.4 oz)       Palliative Assessment/Data:    Flowsheet Rows  Most Recent Value   Intake Tab    Referral Department  Hospitalist   Unit at Time of Referral  Med/Surg Unit   Date Notified  12/31/15   Palliative Care Type  Return patient Palliative Care   Reason for referral  Clarify Goals of Care, Counsel Regarding Hospice   Date of Admission  12/31/15   Date first seen by Palliative Care  01/01/16   # of days Palliative referral response time  1 Day(s)   # of days IP prior to Palliative referral  0   Clinical Assessment    Palliative Performance Scale Score  30%   Pain Max last 24 hours  Not able to report   Pain Min Last 24 hours  Not able to report   Dyspnea Max Last 24 Hours  Not able to report   Dyspnea Min Last 24 hours  Not able to report   Nausea Max Last 24 Hours  Not able to report   Nausea Min Last 24 Hours  Not able to report   Anxiety Max Last 24 Hours  Not able to report   Anxiety Min Last 24 Hours  Not able to report   Other Max Last 24 Hours  Not able to report   Psychosocial & Spiritual Assessment    Palliative Care Outcomes    Palliative Care Outcomes  Clarified goals of care   Palliative Care follow-up planned  Yes, Facility      Patient Active Problem List   Diagnosis Date Noted  . Palliative care encounter   . Aspiration pneumonia ([place]) 12/31/2015  . Acute respiratory failure with hypoxia ([place]) 12/31/2015  . Acute systolic congestive heart failure, NYHA class 2 ([place]) 12/31/2015  . Pressure ulcer 12/31/2015  . AKI (acute kidney injury) ([place])   . Acute deep vein thrombosis (DVT) of axillary vein of right upper extremity ([place])   .  Ischemia of finger   . PAF (paroxysmal atrial fibrillation) ([place])   . History of GI bleed   . Prediabetes   . Dysphagia   . Leukocytosis   . Acute on chronic combined systolic and diastolic heart failure, NYHA class 2 ([place]) 12/15/2015  . Anemia of chronic kidney failure 12/15/2015  . Debilitated 12/15/2015  . CKD (chronic kidney disease), stage III   . Biventricular cardiac pacemaker in situ 10/11/2011    Palliative Care Assessment & Plan   Patient Profile: 82 yo man with PMH of recent protracted GI bleed while on coumadin with associated encephalopathy and non-STEMI. He has sCHF with 30-35% and is aspirating all consistancies   Assessment: Pt is a/o x 3. Very weak. Observed to cough and choke after each bites  Recommendations/Plan:  Continue with SW consult to in-pt hospice as per request  Will add PO ms04 concentrate vs. IV ms04 for pain and dyspnea mgt  Goals of Care and Additional Recommendations:  Limitations on Scope of Treatment: Continue to treat the treatable in terms of iv abx , fluids until discharge to in-pt hospice  Wife understands that iv fluids and abx cannot be provided at in-pt hospice  Code Status:    Code Status Orders        Start     Ordered   01/01/16 0918  Do not attempt resuscitation (DNR)   Continuous    Question Answer Comment  In the event of cardiac or respiratory ARREST Do not call a "code blue"   In the event of cardiac or respiratory ARREST Do not perform  Intubation, CPR, defibrillation or ACLS   In the event of cardiac or respiratory ARREST Use medication by any route, position, wound care, and other measures to relive pain and suffering. May use oxygen, suction and manual treatment of airway obstruction as needed for comfort.      01/01/16 0917    Code Status History    Date Active Date Inactive Code Status Order ID Comments User Context   12/31/2015  6:11 PM 01/01/2016  9:17 AM Partial Code [id number]  Samella Parr, NP Inpatient    12/31/2015  3:54 PM 12/31/2015  6:11 PM Partial Code [id number]  Samella Parr, NP ED   12/16/2015  8:13 AM 12/24/2015  3:33 PM Partial Code [id number]  Acquanetta Chain, DO Inpatient   12/15/2015  4:49 PM 12/16/2015  8:13 AM Full Code [id number]  Cathlyn Parsons, PA-C Inpatient   12/12/2015  1:49 AM 12/15/2015  4:49 PM Full Code [id number]  Vianne Bulls, MD Inpatient   12/12/2015 12:08 AM 12/12/2015  1:49 AM Full Code [id number]  Vianne Bulls, MD Inpatient   12/11/2015  6:33 PM 12/12/2015 12:08 AM Full Code [id number]  Cathlyn Parsons, PA-C Inpatient   12/11/2015  6:33 PM 12/11/2015  6:33 PM Full Code [id number]  Cathlyn Parsons, PA-C Inpatient   12/05/2015  5:53 AM 12/11/2015  6:33 PM Full Code [id number]  Bethena Roys, MD Inpatient   06/28/2014  4:13 PM 06/29/2014  4:40 PM Full Code [id number]  Marton Redwood, MD Inpatient   03/20/2013  4:42 PM 03/22/2013  3:04 PM Full Code [id number]  Andrez Grime, PA-C Inpatient       Prognosis:   < 4 weeks in the setting of severe dysphagia immanent asp pna, sepsis, with underlying sCHF with ef 30-35%, worsening functional status, dependant for all ADL's  Discharge Planning:  Hospice facility  Care plan was discussed with Dr. Letta Median  Thank you for allowing the Palliative Medicine Team to assist in the care of this patient.   Time In: 0800 Time Out: 0825 Total Time 25 min Prolonged Time Billed  no       Greater than 50%  of this time was spent counseling and coordinating care related to the above assessment and plan.  Dory Horn, NP  Please contact Palliative Medicine Team phone at [phone] for questions and concerns.

## 2016-01-03 NOTE — Progress Notes (Signed)
OT Cancellation Note  Patient Details Name: [name] MRN: [id number] DOB: 1924/08/30   Cancelled Treatment:    Reason Eval/Treat Not Completed: Other (comment). Chart reviewed and noted plan is D/C to inpatient hospice facility with multi-organ failure and aspirating on his own saliva and BP soft 79/48 with K+ high 5.9. Pt does not appear appropriate for OT eval due to current medical condition and D/C plan--if this changes please re-consult OT. We will sign off for now.  Almon Register [id number] 01/03/2016, 2:30 PM

## 2016-01-03 NOTE — Progress Notes (Addendum)
PROGRESS NOTE  [name] [id number] DOB: 1925-04-30 DOA: 12/31/2015 PCP: Donnajean Lopes, MD   LOS: 3 days   Brief Narrative: 82 year old male with recent hospitalization secondary to GI bleed on warfarin and severe metabolic encephalopathy of non-STEMI, recently discharged from CIR, presents with failure to thrive, generalized weakness, and choking on eating  Assessment & Plan: Principal Problem:   Acute on chronic combined systolic and diastolic heart failure, NYHA class 2 (HCC) Active Problems:   Biventricular cardiac pacemaker in situ   CKD (chronic kidney disease), stage III   Anemia of chronic kidney failure   Debilitated   PAF (paroxysmal atrial fibrillation) (HCC)   History of GI bleed   Prediabetes   Dysphagia   Leukocytosis   Aspiration pneumonia (HCC)   Acute respiratory failure with hypoxia (HCC)   AKI (acute kidney injury) ([place])   Acute systolic congestive heart failure, NYHA class 2 (HCC)   Pressure ulcer   Palliative care encounter   Acute hypoxic respiratory failure secondary to Acute on chronic combined systolic and diastolic heart failure, NYHA class 2 /severe pulmonary hypertension/moderate tricuspid regurgitation and Aspiration pneumonia - Oxygen as needed, patient with significant fluid overload, diuresis limited by hypotension and renal failure - Echocardiogram last admission in April: EF 30-35% with normal wall thickness, moderate TR with moderate to severe pulmonary hypertension and although study not technically sufficient to allow for evaluation of diastolic dysfunction or urinary hypertension with moderate TR highly suggestive of diastolic dysfunction - No ACE inhibitor secondary to chronic kidney disease; was not on beta blocker prior to admission and will hold given acute heart failure exacerbation  Goals of care - Extensive discussion with patient's wife on 5/19 Patient has had several recent hospitalizations, progressive decline and failure  to thrive. He has declined significantly in the last few weeks and more so since he has been home. Patient's wife tells me that he would not want resuscitation if his heart were to stop, and would want to avoid escalation of care such as ICU, central lines, would want to focus on "treating the treatable". We'll continue to provide antibiotics, diuresis if blood pressure allows antiseizure medications but avoid ICU. I discussed with her that his condition currently is likely to be terminal as has multiorgan system failure with heart failure, renal failure, hypoxia and he is severely malnourished. I have consulted palliative care to further discuss these issues with the wife. - Discussed again with the wife today at bedside, he improved some with antibiotics and clinically looks better, however continues to have significant aspiration even his own secretions and discomforts of very poor prognosis. She expressed understanding.  Dysphagia - speech therapy consulted  AKI on CKD (chronic kidney disease), stage III - His creatinine is overall stable, worse on the little bit today  PAF (paroxysmal atrial fibrillation)  - Currently 100% ventricular paced - On Xarelto prior to admission and will continue for now - CHADVASC = 4  Leukocytosis - Likely related to low perfusion in setting of heart failure as well as recurrent aspiration pneumonitis - Improved on 5/20, however back up to 40 K today, I wonder whether the one on 5/20 was accurate  Biventricular cardiac pacemaker in situ  Anemia of chronic kidney failure - monitor   History of GI bleed - No signs of bleeding since discharge to home according to wife  Prediabetes - hGBA1c was 5.7 previous admission    DVT prophylaxis: Coumadin Code Status: DNR Family Communication: no family bedside today  Disposition Plan: To be determined   Consultants:   Palliative care   Procedures:   None   Antimicrobials:  Levaquin 5/18  >>  Subjective: - Mental status varies, in the morning he was very drowsy and in the afternoon was a little bit more alert. Seems to be aspirating even when he is sleeping  Objective: Filed Vitals:   01/02/16 2051 01/03/16 0625 01/03/16 0846 01/03/16 1256  BP: 103/50 98/55 91/56  79/48  Pulse: 88 81 81 89  Temp: 98.1 F (36.7 C) 98.1 F (36.7 C)  98.4 F (36.9 C)  TempSrc: Oral Oral  Oral  Resp: 18 18  20   Height:      Weight:  50.304 kg (110 lb 14.4 oz)    SpO2: 98% 97% 98% 100%    Intake/Output Summary (Last 24 hours) at 01/03/16 1409 Last data filed at 01/03/16 [id number]  Gross per 24 hour  Intake    440 ml  Output    650 ml  Net   -210 ml   Filed Weights   01/02/16 0510 01/02/16 1500 01/03/16 0625  Weight: 51.166 kg (112 lb 12.8 oz) 51.166 kg (112 lb 12.8 oz) 50.304 kg (110 lb 14.4 oz)    Examination: Constitutional: cachectic appearing elderly male Filed Vitals:   01/02/16 2051 01/03/16 0625 01/03/16 0846 01/03/16 1256  BP: 103/50 98/55 91/56  79/48  Pulse: 88 81 81 89  Temp: 98.1 F (36.7 C) 98.1 F (36.7 C)  98.4 F (36.9 C)  TempSrc: Oral Oral  Oral  Resp: 18 18  20   Height:      Weight:  50.304 kg (110 lb 14.4 oz)    SpO2: 98% 97% 98% 100%   ENMT: Mucous membranes are dry. Respiratory:  coarse breath sounds bilaterally, no wheezing, bibasilar crackles  Cardiovascular:  regular, 2+ pitting lower and upper extremity edema, no murmurs heard  Abdomen: no tenderness. Bowel sounds positive.  Musculoskeletal: no clubbing / cyanosis. No contractures. Decreased muscle tone.  Neurologic:  not following commands consistently    Data Reviewed: I have personally reviewed following labs and imaging studies  CBC:  Recent Labs Lab 12/31/15 1133 01/01/16 0334 01/02/16 0432 01/03/16 0445  WBC 19.1* 27.5* 5.8 40.7*  NEUTROABS 14.7* 23.9*  --   --   HGB 12.9* 11.3* 12.3* 11.8*  HCT 39.2 36.0* 39.5 36.9*  MCV 97.3 101.4* 89.6 100.8*  PLT 170 130* 193 76*   Basic  Metabolic Panel:  Recent Labs Lab 12/31/15 1133 01/01/16 0334 01/01/16 0919 01/02/16 0432 01/03/16 0445  NA 136 135  --  143 135  K 5.3* 6.5* 4.9 3.9 5.9*  CL 102 100*  --  107 104  CO2 23 21*  --  27 21*  GLUCOSE 83 101*  --  133* 159*  BUN 29* 31*  --  21* 33*  CREATININE 1.56* 1.62*  --  1.43* 1.54*  CALCIUM 8.1* 7.4*  --  9.2 7.5*   GFR: Estimated Creatinine Clearance: 23.6 mL/min (by C-G formula based on Cr of 1.54). Liver Function Tests:  Recent Labs Lab 12/31/15 1133  AST 40  ALT 18  ALKPHOS 182*  BILITOT 0.5  PROT 6.3*  ALBUMIN 2.4*   No results for input(s): LIPASE, AMYLASE in the last 168 hours. No results for input(s): AMMONIA in the last 168 hours. Coagulation Profile:  Recent Labs Lab 12/31/15 1303 01/01/16 0334  INR 2.81* 2.95*   Cardiac Enzymes: No results for input(s): CKTOTAL, CKMB, CKMBINDEX, TROPONINI in the last  168 hours. BNP (last 3 results) No results for input(s): PROBNP in the last 8760 hours. HbA1C: No results for input(s): HGBA1C in the last 72 hours. CBG: No results for input(s): GLUCAP in the last 168 hours. Lipid Profile: No results for input(s): CHOL, HDL, LDLCALC, TRIG, CHOLHDL, LDLDIRECT in the last 72 hours. Thyroid Function Tests: No results for input(s): TSH, T4TOTAL, FREET4, T3FREE, THYROIDAB in the last 72 hours. Anemia Panel: No results for input(s): VITAMINB12, FOLATE, FERRITIN, TIBC, IRON, RETICCTPCT in the last 72 hours. Urine analysis:    Component Value Date/Time   COLORURINE YELLOW 12/31/2015 [place] 12/31/2015 1322   LABSPEC 1.017 12/31/2015 1322   PHURINE 7.0 12/31/2015 1322   GLUCOSEU NEGATIVE 12/31/2015 1322   HGBUR MODERATE* 12/31/2015 1322   BILIRUBINUR NEGATIVE 12/31/2015 1322   KETONESUR NEGATIVE 12/31/2015 1322   PROTEINUR NEGATIVE 12/31/2015 1322   UROBILINOGEN 0.2 06/28/2014 1401   NITRITE NEGATIVE 12/31/2015 1322   LEUKOCYTESUR NEGATIVE 12/31/2015 1322   Sepsis  Labs: Invalid input(s): PROCALCITONIN, LACTICIDVEN  Recent Results (from the past 240 hour(s))  Urine culture     Status: Abnormal   Collection Time: 12/28/15  1:00 PM  Result Value Ref Range Status   Specimen Description URINE, CLEAN CATCH  Final   Special Requests NONE  Final   Culture MULTIPLE SPECIES PRESENT, SUGGEST RECOLLECTION (A)  Final   Report Status 12/30/2015 FINAL  Final    Radiology Studies: No results found.   Scheduled Meds: . furosemide  40 mg Intravenous BID  . levofloxacin (LEVAQUIN) IV  750 mg Intravenous Q48H  . sodium chloride flush  3 mL Intravenous Q12H  . valproate sodium  750 mg Intravenous Q12H   Continuous Infusions:   Time spent: 25 minutes, more than 50% in counseling bedside  Marzetta Board, MD, PhD [facility] Pager [phone]  If 7PM-7AM, please contact night-coverage [email or web address] Password [email or web address] 01/03/2016, 2:09 PM

## 2016-01-04 LAB — BASIC METABOLIC PANEL
Anion gap: 8 (ref 5–15)
BUN: 40 mg/dL — AB (ref 6–20)
CALCIUM: 7.8 mg/dL — AB (ref 8.9–10.3)
CHLORIDE: 106 mmol/L (ref 101–111)
CO2: 22 mmol/L (ref 22–32)
CREATININE: 1.51 mg/dL — AB (ref 0.61–1.24)
GFR calc non Af Amer: 39 mL/min — ABNORMAL LOW (ref >60)
GFR, EST AFRICAN AMERICAN: 46 mL/min — AB (ref >60)
GLUCOSE: 149 mg/dL — AB (ref 65–99)
Potassium: 5.1 mmol/L (ref 3.5–5.1)
Sodium: 136 mmol/L (ref 135–145)

## 2016-01-04 LAB — PROCALCITONIN: PROCALCITONIN: 0.77 ng/mL

## 2016-01-04 LAB — CBC
HCT: 35.7 % — ABNORMAL LOW (ref 39.0–52.0)
Hemoglobin: 11.5 g/dL — ABNORMAL LOW (ref 13.0–17.0)
MCH: 31.9 pg (ref 26.0–34.0)
MCHC: 32.2 g/dL (ref 30.0–36.0)
MCV: 99.2 fL (ref 78.0–100.0)
PLATELETS: 76 10*3/uL — AB (ref 150–400)
RBC: 3.6 MIL/uL — AB (ref 4.22–5.81)
RDW: 21.8 % — AB (ref 11.5–15.5)
WBC: 29.9 10*3/uL — ABNORMAL HIGH (ref 4.0–10.5)

## 2016-01-04 LAB — PROTIME-INR
INR: 1.64 — ABNORMAL HIGH (ref 0.00–1.49)
PROTHROMBIN TIME: 19.4 s — AB (ref 11.6–15.2)

## 2016-01-04 MED ORDER — RIVAROXABAN 15 MG PO TABS: 15.0000 mg | ORAL_TABLET | Freq: Every day | ORAL | Status: DC

## 2016-01-04 NOTE — Clinical Social Work Note (Signed)
Per [name] from [facility], patient has a bed available today if medically stable. MD will speak with wife before determining if patient is ready for discharge.  [name], [place]

## 2016-01-04 NOTE — NC FL2 (Signed)
[place] MEDICAID FL2 LEVEL OF CARE SCREENING TOOL     IDENTIFICATION  Patient Name: [name] Birthdate: 04-05-1925 Sex: male Admission Date (Current Location): 12/31/2015  [facility] and [place] Number:  Herbalist and Address:  [facility]. [facility], [place] [address], [place], [place] [address]      Provider Number: [id number]  Attending Physician Name and Address:  Caren Griffins, MD  Relative Name and Phone Number:       Current Level of Care: Hospital Recommended Level of Care: Other (Comment) ([facility]idential Hospice) Prior Approval Number:    Date Approved/Denied:   PASRR Number:    Discharge Plan: Other (Comment) (Residential Hospice)    Current Diagnoses: Patient Active Problem List   Diagnosis Date Noted  . Palliative care encounter   . Aspiration pneumonia ([place]) 12/31/2015  . Acute respiratory failure with hypoxia ([place]) 12/31/2015  . Acute systolic congestive heart failure, NYHA class 2 ([place]) 12/31/2015  . Pressure ulcer 12/31/2015  . AKI (acute kidney injury) ([place])   . Acute deep vein thrombosis (DVT) of axillary vein of right upper extremity ([place])   . Ischemia of finger   . PAF (paroxysmal atrial fibrillation) ([place])   . History of GI bleed   . Prediabetes   . Dysphagia   . Leukocytosis   . Acute on chronic combined systolic and diastolic heart failure, NYHA class 2 ([place]) 12/15/2015  . Anemia of chronic kidney failure 12/15/2015  . Debilitated 12/15/2015  . CKD (chronic kidney disease), stage III   . Biventricular cardiac pacemaker in situ 10/11/2011    Orientation RESPIRATION BLADDER Height & Weight     Self  O2 (Nasal Canula 3 L) Continent, Incontinent, External catheter Weight: 112 lb (50.803 kg) Height:  5\' 7"  (170.2 cm)  BEHAVIORAL SYMPTOMS/MOOD NEUROLOGICAL BOWEL NUTRITION STATUS   (None)  (None) Continent Diet (Full liquid)  AMBULATORY STATUS COMMUNICATION OF NEEDS Skin   Extensive Assist Verbally PU Stage and  Appropriate Care   PU Stage 2 Dressing:  (Foam prn)                   Personal Care Assistance Level of Assistance  Bathing, Feeding, Dressing Bathing Assistance: Maximum assistance Feeding assistance: Limited assistance Dressing Assistance: Maximum assistance     Functional Limitations Info  Sight, Hearing, Speech Sight Info: Adequate Hearing Info: Adequate Speech Info: Adequate    SPECIAL CARE FACTORS FREQUENCY                       Contractures Contractures Info: Not present    Additional Factors Info  Code Status, Allergies Code Status Info: DNR Allergies Info: Penicillins           Current Medications (01/04/2016):  This is the current hospital active medication list Current Facility-Administered Medications  Medication Dose Route Frequency Provider Last Rate Last Dose  . 0.9 %  sodium chloride infusion  250 mL Intravenous PRN Samella Parr, NP      . acetaminophen (TYLENOL) suppository 650 mg  650 mg Rectal Q6H PRN Dory Horn, NP      . furosemide (LASIX) injection 40 mg  40 mg Intravenous BID Caren Griffins, MD   40 mg at 01/01/16 1800  . levofloxacin (LEVAQUIN) IVPB 750 mg  750 mg Intravenous Q48H Samella Parr, NP   750 mg at 01/02/16 1746  . LORazepam (ATIVAN) injection 0.5 mg  0.5 mg Intravenous Q6H PRN Judson Roch  Hughes Better, NP   0.5 mg at 01/03/16 1242  . morphine 2 MG/ML injection 1-2 mg  1-2 mg Intravenous Q3H PRN Dory Horn, NP   2 mg at 01/02/16 0000  . morphine CONCENTRATE 10 MG/0.5ML oral solution 5 mg  5 mg Oral Q2H PRN Dory Horn, NP   5 mg at 01/03/16 1244  . ondansetron (ZOFRAN) injection 4 mg  4 mg Intravenous Q6H PRN Samella Parr, NP      . sodium chloride flush (NS) 0.9 % injection 3 mL  3 mL Intravenous Q12H Samella Parr, NP   3 mL at 01/04/16 0956  . sodium chloride flush (NS) 0.9 % injection 3 mL  3 mL Intravenous PRN Samella Parr, NP      . valproate (DEPACON) 750 mg in dextrose 5 % 50 mL IVPB   750 mg Intravenous Q12H Caren Griffins, MD   750 mg at 01/04/16 [id number]     Discharge Medications: Please see discharge summary for a list of discharge medications.  Relevant Imaging Results:  Relevant Lab Results:   Additional Information SS#: [id number]  Candie Chroman, LCSW

## 2016-01-04 NOTE — Clinical Social Work Note (Signed)
Clinical Social Work Assessment  Patient Details  Name: [name] MRN: [id number] Date of Birth: 05/11/1925  Date of referral:  01/01/16               Reason for consult:  Facility Placement, Discharge Planning                Permission sought to share information with:  Facility Sport and exercise psychologist, Family Supports Permission granted to share information::  Yes, Verbal Permission Granted  Name::     [name] Hunter  Agency::  [facility]  Relationship::  Wife  Contact Information:  [phone]  Housing/Transportation Living arrangements for the past 2 months:  [place] of Information:  Medical Team, Spouse Patient Interpreter Needed:  None Criminal Activity/Legal Involvement Pertinent to Current Situation/Hospitalization:  No - Comment as needed Significant Relationships:  Spouse Lives with:  Spouse Do you feel safe going back to the place where you live?  Yes Need for family participation in patient care:  Yes (Comment)  Care giving concerns:  Recommendations for residential hospice at discharge.   Social Worker assessment / plan:  CSW called patient's wife as patient is not fully oriented and wife was not in room. CSW introduced role and explained that discharge planning would be discussed. Prior to call to patient's wife, CSW had received a call from [facility], Erling Conte [phone]) stated that the palliative nurse had made a referral but the CSW usually makes those. CSW told Ms. Rosana Hoes that [place] would confirm with patient's wife first before continuing with the referral. Patient's wife confirmed over the phone. She was in the parking lot on the way up to patient's room. CSW notified Ms. Rosana Hoes of this so that placement could be discussed. No further concerns. CSW encouraged patient's wife to contact CSW as needed. CSW will continue to follow patient and facilitate discharge to residential hospice once medically stable for  discharge.  Employment status:  Retired Forensic scientist:  Medicare PT Recommendations:  Not assessed at this time [place] / Referral to community resources:  Other (Comment Required) (Residential Hospice)  Patient/Family's Response to care:  Patient not fully oriented. Patient's wife agreeable to residential hospice placement. Patient's wife supportive and involved in patient's care. Patient's wife polite and appreciated social work intervention.  Patient/Family's Understanding of and Emotional Response to Diagnosis, Current Treatment, and Prognosis:  Patient not fully oriented. Patient's wife understanding of medical interventions and aware of recommendation for residential hospice once medically stable for discharge.  Emotional Assessment Appearance:  Appears stated age Attitude/Demeanor/Rapport:  Unable to Assess Affect (typically observed):  Unable to Assess Orientation:  Oriented to Self Alcohol / Substance use:  Never Used Psych involvement (Current and /or in the community):  No (Comment)  Discharge Needs  Concerns to be addressed:  Care Coordination Readmission within the last 30 days:  Yes Current discharge risk:  Dependent with Mobility, Terminally ill Barriers to Discharge:  No Barriers Identified   Candie Chroman, LCSW 01/04/2016, 11:10 AM

## 2016-01-04 NOTE — Consult Note (Addendum)
[facility] [employer] Received request for family interest in [employer] [facility] [employer] from [place], confirmed by CSW Judson Roch. Chart reviewed and met with spouse at length to answer her questions and explore goals based on her questions. She was tearful throughout this visit and expressed desire to speak with Dr. Sharlett Iles or Dr. Cruzita Lederer. She expressed desire for IV fluids to continue and possibility of taking him home instead of [facility] due to she feels he really wants to be at home. She also questioned prognosis and possibility of remaining in hospital. Made RN, CSW and Dr. Cruzita Lederer aware of her request. [facility] Liaison will follow up tomorrow regarding availability tomorrow.   Thank you.  Erling Conte, [place]

## 2016-01-04 NOTE — Progress Notes (Signed)
PROGRESS NOTE   [name] [id number] DOB: [birth year] DOA: 12/31/2015 PCP: Donnajean Lopes, MD   LOS: 4 days   Brief Narrative: 82 year old male with recent hospitalization secondary to GI bleed on warfarin and severe metabolic encephalopathy of non-STEMI, recently discharged from CIR, presents with failure to thrive, generalized weakness, and choking on eating  Assessment & Plan: Principal Problem:   Acute on chronic combined systolic and diastolic heart failure, NYHA class 2 (HCC) Active Problems:   Biventricular cardiac pacemaker in situ   CKD (chronic kidney disease), stage III   Anemia of chronic kidney failure   Debilitated   PAF (paroxysmal atrial fibrillation) (HCC)   History of GI bleed   Prediabetes   Dysphagia   Leukocytosis   Aspiration pneumonia (HCC)   Acute respiratory failure with hypoxia (HCC)   AKI (acute kidney injury) ([place])   Acute systolic congestive heart failure, NYHA class 2 (HCC)   Pressure ulcer   Palliative care encounter   Acute hypoxic respiratory failure secondary to Acute on chronic combined systolic and diastolic heart failure, NYHA class 2 /severe pulmonary hypertension/moderate tricuspid regurgitation and Aspiration pneumonia - Oxygen as needed, patient with significant fluid overload, diuresis limited by hypotension and renal failure - Echocardiogram last admission in April: EF 30-35% with normal wall thickness, moderate TR with moderate to severe pulmonary hypertension and although study not technically sufficient to allow for evaluation of diastolic dysfunction or urinary hypertension with moderate TR highly suggestive of diastolic dysfunction - No ACE inhibitor secondary to chronic kidney disease; was not on beta blocker prior to admission and will hold given acute heart failure exacerbation  Goals of care - Extensive discussion with patient's wife on 5/19 Patient has had several recent hospitalizations, progressive decline and  failure to thrive. He has declined significantly in the last few weeks and more so since he has been home. Patient's wife tells me that he would not want resuscitation if his heart were to stop, and would want to avoid escalation of care such as ICU, central lines, would want to focus on "treating the treatable". We'll continue to provide antibiotics, diuresis if blood pressure allows antiseizure medications but avoid ICU. I discussed with her that his condition currently is likely to be terminal as has multiorgan system failure with heart failure, renal failure, hypoxia and he is severely malnourished. I have consulted palliative care to further discuss these issues with the wife. - Discussed again with the wife 5/21 at bedside, he improved some with antibiotics and clinically looks better, however continues to have significant aspiration even his own secretions and discomforts of very poor prognosis. She expressed understanding. - talked with wife today 5/22 at length, she is coming to terms more and more that [name] is approaching EOL however she wanted to discuss further with Dr. Philip Aspen. Called his office, left voicemail for call back.  Dysphagia - speech therapy consulted  AKI on CKD (chronic kidney disease), stage III - His creatinine is overall stable  PAF (paroxysmal atrial fibrillation)  - Currently 100% ventricular paced - On Xarelto prior to admission. To weak now for po meds. Talked to wife at length his anticoagulation is of long term benefit to reduce annual stroke risk. Hold as we are transitioning towards comfort - CHADVASC = 4  Leukocytosis - Likely related to low perfusion in setting of heart failure as well as recurrent aspiration pneumonitis  Biventricular cardiac pacemaker in situ  Anemia of chronic kidney failure - monitor  History of GI bleed - No signs of bleeding since discharge to home according to wife  Prediabetes - hGBA1c was 5.7 previous  admission    DVT prophylaxis: Coumadin Code Status: DNR Family Communication: no family bedside today  Disposition Plan: To be determined   Consultants:   Palliative care   Procedures:   None   Antimicrobials:  Levaquin 5/18 >>  Subjective: - alert this morning, later poorly responsive  Objective: Filed Vitals:   01/04/16 0512 01/04/16 0600 01/04/16 0754 01/04/16 1122  BP: 84/55  88/59 109/61  Pulse: 89  88 89  Temp:  97.4 F (36.3 C)  98.8 F (37.1 C)  TempSrc:  Oral  Rectal  Resp: 15  16 18   Height:      Weight: 50.803 kg (112 lb)     SpO2: 95%   97%    Intake/Output Summary (Last 24 hours) at 01/04/16 1320 Last data filed at 01/04/16 1000  Gross per 24 hour  Intake    675 ml  Output    550 ml  Net    125 ml   Filed Weights   01/02/16 1500 01/03/16 0625 01/04/16 0512  Weight: 51.166 kg (112 lb 12.8 oz) 50.304 kg (110 lb 14.4 oz) 50.803 kg (112 lb)    Examination: Constitutional: cachectic appearing elderly male Filed Vitals:   01/04/16 0512 01/04/16 0600 01/04/16 0754 01/04/16 1122  BP: 84/55  88/59 109/61  Pulse: 89  88 89  Temp:  97.4 F (36.3 C)  98.8 F (37.1 C)  TempSrc:  Oral  Rectal  Resp: 15  16 18   Height:      Weight: 50.803 kg (112 lb)     SpO2: 95%   97%   ENMT: Mucous membranes are dry. Respiratory:  coarse breath sounds bilaterally, no wheezing, bibasilar crackles  Cardiovascular:  regular, 2+ pitting lower and upper extremity edema, no murmurs heard  Abdomen: no tenderness. Bowel sounds positive.  Musculoskeletal: no clubbing / cyanosis. No contractures. Decreased muscle tone.  Neurologic:  not following commands consistently    Data Reviewed: I have personally reviewed following labs and imaging studies  CBC:  Recent Labs Lab 12/31/15 1133 01/01/16 0334 01/02/16 0432 01/03/16 0445 01/04/16 0630  WBC 19.1* 27.5* 5.8 40.7* 29.9*  NEUTROABS 14.7* 23.9*  --   --   --   HGB 12.9* 11.3* 12.3* 11.8* 11.5*  HCT 39.2  36.0* 39.5 36.9* 35.7*  MCV 97.3 101.4* 89.6 100.8* 99.2  PLT 170 130* 193 76* 76*   Basic Metabolic Panel:  Recent Labs Lab 12/31/15 1133 01/01/16 0334 01/01/16 0919 01/02/16 0432 01/03/16 0445 01/04/16 0630  NA 136 135  --  143 135 136  K 5.3* 6.5* 4.9 3.9 5.9* 5.1  CL 102 100*  --  107 104 106  CO2 23 21*  --  27 21* 22  GLUCOSE 83 101*  --  133* 159* 149*  BUN 29* 31*  --  21* 33* 40*  CREATININE 1.56* 1.62*  --  1.43* 1.54* 1.51*  CALCIUM 8.1* 7.4*  --  9.2 7.5* 7.8*   GFR: Estimated Creatinine Clearance: 24.3 mL/min (by C-G formula based on Cr of 1.51). Liver Function Tests:  Recent Labs Lab 12/31/15 1133  AST 40  ALT 18  ALKPHOS 182*  BILITOT 0.5  PROT 6.3*  ALBUMIN 2.4*   No results for input(s): LIPASE, AMYLASE in the last 168 hours. No results for input(s): AMMONIA in the last 168 hours. Coagulation Profile:  Recent Labs Lab 12/31/15 1303 01/01/16 0334 01/04/16 0630  INR 2.81* 2.95* 1.64*   Cardiac Enzymes: No results for input(s): CKTOTAL, CKMB, CKMBINDEX, TROPONINI in the last 168 hours. BNP (last 3 results) No results for input(s): PROBNP in the last 8760 hours. HbA1C: No results for input(s): HGBA1C in the last 72 hours. CBG: No results for input(s): GLUCAP in the last 168 hours. Lipid Profile: No results for input(s): CHOL, HDL, LDLCALC, TRIG, CHOLHDL, LDLDIRECT in the last 72 hours. Thyroid Function Tests: No results for input(s): TSH, T4TOTAL, FREET4, T3FREE, THYROIDAB in the last 72 hours. Anemia Panel: No results for input(s): VITAMINB12, FOLATE, FERRITIN, TIBC, IRON, RETICCTPCT in the last 72 hours. Urine analysis:    Component Value Date/Time   COLORURINE YELLOW 12/31/2015 [place] 12/31/2015 1322   LABSPEC 1.017 12/31/2015 1322   PHURINE 7.0 12/31/2015 1322   GLUCOSEU NEGATIVE 12/31/2015 1322   HGBUR MODERATE* 12/31/2015 1322   BILIRUBINUR NEGATIVE 12/31/2015 1322   KETONESUR NEGATIVE 12/31/2015 1322    PROTEINUR NEGATIVE 12/31/2015 1322   UROBILINOGEN 0.2 06/28/2014 1401   NITRITE NEGATIVE 12/31/2015 1322   LEUKOCYTESUR NEGATIVE 12/31/2015 1322   Sepsis Labs: Invalid input(s): PROCALCITONIN, LACTICIDVEN  Recent Results (from the past 240 hour(s))  Urine culture     Status: Abnormal   Collection Time: 12/28/15  1:00 PM  Result Value Ref Range Status   Specimen Description URINE, CLEAN CATCH  Final   Special Requests NONE  Final   Culture MULTIPLE SPECIES PRESENT, SUGGEST RECOLLECTION (A)  Final   Report Status 12/30/2015 FINAL  Final    Radiology Studies: No results found.   Scheduled Meds: . furosemide  40 mg Intravenous BID  . levofloxacin (LEVAQUIN) IV  750 mg Intravenous Q48H  . sodium chloride flush  3 mL Intravenous Q12H  . valproate sodium  750 mg Intravenous Q12H    Continuous Infusions:   Time spent: 35 minutes, 25 bedside involved in counseling, [place] discussions.   Marzetta Board, MD, PhD [facility] Pager [phone]  If 7PM-7AM, please contact night-coverage [email or web address] Password [email or web address] 01/04/2016, 1:20 PM

## 2016-01-04 NOTE — Progress Notes (Signed)
Pharmacy Consult --> Xarelto  Restarting Xarelto for Afib CrCL = 24 ml/min  Plan: Restart Xarelto at decreased dose of 15 mg po daily Pharmacy to sign off and follow peripherally  Thank you Anette Guarneri, PharmD [phone]

## 2016-01-04 NOTE — Progress Notes (Signed)
Palliative Medicine RN Note: Pending inpt hospice eval written 5/19. Spoke with wife who prefers [facility] based on location. Contacted Eva with [facility] to notify of referral.   Larina Earthly, RN, BSN, [facility] 01/04/2016 9:27 AM Cell [phone] 8:00-4:00 Monday-Friday Office [phone]

## 2016-01-05 ENCOUNTER — Encounter: Payer: Medicare Other | Admitting: *Deleted

## 2016-01-05 ENCOUNTER — Telehealth: Payer: Self-pay | Admitting: Cardiology

## 2016-01-05 NOTE — Care Management Important Message (Signed)
Important Message  Patient Details  Name: [name] MRN: [id number] Date of Birth: May 01, 1925   Medicare Important Message Given:  Yes    Adellyn Capek, [place] 01/05/2016, 2:08 PM

## 2016-01-05 NOTE — Progress Notes (Signed)
SLP Discharge Note  Patient Details Name: [name] MRN: [id number] DOB: 1925/01/03   Cancelled treatment:       Reason Eval/Treat Not Completed: Other (comment)Pt being D/Cd today to residential hospice.  Our services will sign off.   Juan Quam Laurice 01/05/2016, 3:01 PM

## 2016-01-05 NOTE — Progress Notes (Signed)
Palliative Medicine RN Note: met with wife in pt's room. He is oriented to place and year but cannot answer when asked his name, instead giving me his home address. Spoke with wife outside of room. She was able to speak to Wheeler at Dr Buel Ream office, and she reports that the [place] told her [facility] was the best option for Mr Bidinger. Contacted Collie Siad with [facility]; she will check bed availability and call me and wife. Updated CSW and CM Hassan Rowan about pending d/c. Dr Cruzita Lederer will proceed with d/c orders and summary so pt can be d/c asap.   Larina Earthly, RN, BSN, [facility] 01/05/2016 2:42 PM Cell [phone] 8:00-4:00 Monday-Friday Office [phone]

## 2016-01-05 NOTE — Clinical Social Work Note (Addendum)
Patient's wife still waiting to hear from patient's PCP. Per patient's wife, MD will try contacting again.  Dayton Scrape, CSW [phone]  [facility] Waiting to hear from facility to see if they have a bed or not.  Dayton Scrape, [place] [phone]  Spoke with Becky Augusta at [facility]. Patient has a bed and can discharge today. Notified MD. Waiting on paperwork to be signed by patient's wife.  Dayton Scrape, [place]

## 2016-01-05 NOTE — Discharge Summary (Signed)
Physician Discharge Summary  [name] [id number] DOB: 04-09-1925 DOA: 12/31/2015  PCP: Donnajean Lopes, MD  Admit date: 12/31/2015 Discharge date: 01/05/2016  Time spent: > 30 minutes  Recommendations for Outpatient Follow-up:  1. Discharge to residential hospice   Discharge Diagnoses:  Principal Problem:   Acute on chronic combined systolic and diastolic heart failure, NYHA class 2 (HCC) Active Problems:   Biventricular cardiac pacemaker in situ   CKD (chronic kidney disease), stage III   Anemia of chronic kidney failure   Debilitated   PAF (paroxysmal atrial fibrillation) (HCC)   History of GI bleed   Prediabetes   Dysphagia   Leukocytosis   Aspiration pneumonia (HCC)   Acute respiratory failure with hypoxia (HCC)   AKI (acute kidney injury) ([place])   Acute systolic congestive heart failure, NYHA class 2 (HCC)   Pressure ulcer   Palliative care encounter  Discharge Condition: critical   Diet recommendation: dysphagia, as tolerated  Filed Weights   01/03/16 0625 01/04/16 0512 01/05/16 0438  Weight: 50.304 kg (110 lb 14.4 oz) 50.803 kg (112 lb) 48.716 kg (107 lb 6.4 oz)   History of present illness:  See H&P, Labs, Consult and Test reports for all details in brief, patient is a 82 year old male with recent hospitalization secondary to GI bleed on warfarin and severe metabolic encephalopathy of non-STEMI, recently discharged from [facility], presents with failure to thrive, generalized weakness, and choking on eating  Hospital Course:  Acute hypoxic respiratory failure secondary to Acute on chronic combined systolic and diastolic heart failure, NYHA class 2 /severe pulmonary hypertension/moderate tricuspid regurgitation and Aspiration pneumonia - Oxygen as needed, patient with significant fluid overload, diuresis limited by hypotension and renal failure. Echocardiogram last admission in April: EF 30-35% with normal wall thickness, moderate TR with moderate to severe  pulmonary hypertension and although study not technically sufficient to allow for evaluation of diastolic dysfunction or urinary hypertension with moderate TR highly suggestive of diastolic dysfunction. No ACE inhibitor secondary to chronic kidney disease; was not on beta blocker prior to admission and will hold given acute heart failure exacerbation Goals of care - Extensive discussions with patient's wife were held during this hospitalization. Patient has had several recent hospitalizations, progressive decline and failure to thrive. He has declined significantly in the last few weeks and more so since he has been home. Patient's wife tells me that he would not want resuscitation if his heart were to stop, and would want to avoid escalation of care such as ICU, central lines. Palliative care was consulted as well and have followed patient while hospitalized. After discussing with family and involving patient's PCP Dr. Buel Ream office, patient's care was switched to comfort and will discharge to residential hospice.  Dysphagia  AKI on CKD (chronic kidney disease), stage III PAF (paroxysmal atrial fibrillation) - Currently 100% ventricular paced Leukocytosis Biventricular cardiac pacemaker in situ Anemia of chronic kidney failure History of GI bleed Prediabetes  Procedures:  None    Consultations:  Palliative care  Discharge Exam: Filed Vitals:   01/04/16 2136 01/05/16 0438 01/05/16 0838 01/05/16 1208  BP: 96/59 94/60 104/72 99/70  Pulse: 89 90 88 80  Temp:  97.5 F (36.4 C)  96.5 F (35.8 C)  TempSrc:  Oral  Oral  Resp: 18 18  18   Height:      Weight:  48.716 kg (107 lb 6.4 oz)    SpO2:  98%  98%    General: cachectic Cardiovascular: RRR Respiratory: coarse breath sounds  bilaterally  Discharge Instructions Activity:  As tolerated   Get Medicines reviewed and adjusted: Please take all your medications with you for your next visit with your Primary MD  Please request  your Primary MD to go over all hospital tests and procedure/radiological results at the follow up, please ask your Primary MD to get all Hospital records sent to his/her office.  If you experience worsening of your admission symptoms, develop shortness of breath, life threatening emergency, suicidal or homicidal thoughts you must seek medical attention immediately by calling 911 or calling your MD immediately if symptoms less severe.  You must read complete instructions/literature along with all the possible adverse reactions/side effects for all the Medicines you take and that have been prescribed to you. Take any new Medicines after you have completely understood and accpet all the possible adverse reactions/side effects.   Do not drive when taking Pain medications.   Do not take more than prescribed Pain, Sleep and Anxiety Medications  Special Instructions: If you have smoked or chewed Tobacco in the last 2 yrs please stop smoking, stop any regular Alcohol and or any Recreational drug use.  Wear Seat belts while driving.  Please note  You were cared for by a hospitalist during your hospital stay. Once you are discharged, your primary care physician will handle any further medical issues. Please note that NO REFILLS for any discharge medications will be authorized once you are discharged, as it is imperative that you return to your primary care physician (or establish a relationship with a primary care physician if you do not have one) for your aftercare needs so that they can reassess your need for medications and monitor your lab values.    Medication List    STOP taking these medications        allopurinol 300 MG tablet  Commonly known as:  ZYLOPRIM     calcium-vitamin D 500-200 MG-UNIT tablet  Commonly known as:  OSCAL WITH D     loratadine 10 MG tablet  Commonly known as:  CLARITIN     lovastatin 20 MG tablet  Commonly known as:  MEVACOR     multivitamin-lutein Caps  capsule     Rivaroxaban 15 MG Tabs tablet  Commonly known as:  XARELTO      TAKE these medications        acetaminophen 325 MG tablet  Commonly known as:  TYLENOL  Take 2 tablets (650 mg total) by mouth every 6 (six) hours as needed for mild pain, moderate pain, fever or headache.     ALPRAZolam 0.5 MG tablet  Commonly known as:  XANAX  Take 1 mg by mouth at bedtime.     CLEAR EYES OP  Place 1 drop into both eyes daily as needed (irritation/ eye drop).     digoxin 0.125 MG tablet  Commonly known as:  LANOXIN  Take 0.5 tablets (0.0625 mg total) by mouth daily.     divalproex 250 MG DR tablet  Commonly known as:  DEPAKOTE  Take 3 tablets (750 mg total) by mouth every 12 (twelve) hours.     OVER THE COUNTER MEDICATION  Place 1 patch onto the skin as directed. Wound Care that patients wife gets from [facility]. The patient has on a patch that is supposed to help heal his bed sore it is located on buttocks.     oxyCODONE 5 MG immediate release tablet  Commonly known as:  Oxy IR/ROXICODONE  Take 1 tablet (5  mg total) by mouth every 4 (four) hours as needed for moderate pain.     vitamin B-12 1000 MCG tablet  Commonly known as:  CYANOCOBALAMIN  Take 1,000 mcg by mouth daily.         The results of significant diagnostics from this hospitalization (including imaging, microbiology, ancillary and laboratory) are listed below for reference.    Significant Diagnostic Studies: Ct Angio Head W/cm &/or Wo Cm  12/13/2015  CLINICAL DATA:  Altered mental status since 1400 hours, combative and agitated. History of stroke, atrial fibrillation, hypertension, hyperlipidemia, diabetes, bladder cancer. EXAM: CT ANGIOGRAPHY HEAD AND NECK TECHNIQUE: Multidetector CT imaging of the head and neck was performed using the standard protocol during bolus administration of intravenous contrast. Multiplanar CT image reconstructions and MIPs were obtained to evaluate the vascular anatomy. Carotid  stenosis measurements (when applicable) are obtained utilizing NASCET criteria, using the distal internal carotid diameter as the denominator. CONTRAST:  50 cc Isovue 370 COMPARISON:  CT HEAD December 11, 2015 FINDINGS: CT HEAD INTRACRANIAL CONTENTS: The ventricles and sulci are normal for age. No intraparenchymal hemorrhage, mass effect nor midline shift. Small area RIGHT frontal encephalomalacia. Patchy supratentorial white matter hypodensities are within normal range for patient's age and though non-specific likely represent chronic small vessel ischemic disease. No acute large vascular territory infarcts. No abnormal extra-axial fluid collections. Basal cisterns are patent. Moderate calcific atherosclerosis of the carotid siphons. No abnormal intracranial enhancement. ORBITS: The included ocular globes and orbital contents are non-suspicious. Status post bilateral ocular lens implants. SINUSES: Soft tissue opacification LEFT maxillary sinus without expansion. Mastoid air cells are well aerated. SKULL/SOFT TISSUES: No skull fracture. No significant soft tissue swelling. CTA NECK Aortic arch: Normal appearance of the thoracic arch, normal branch pattern. Mild calcific atherosclerosis of the aortic arch. The origins of the innominate, left Common carotid artery and subclavian artery are widely patent. Right carotid system: Common carotid artery is widely patent, coursing in a straight line fashion. Normal appearance of the carotid bifurcation without hemodynamically significant stenosis by NASCET criteria. Mild eccentric calcific atherosclerosis. Normal appearance of the included internal carotid artery. Left carotid system: Common carotid artery is widely patent, coursing in a straight line fashion. Calcific atherosclerosis and intimal thickening with occluded LEFT internal carotid artery from the origin through the intracranial segments. High-grade stenosis LEFT external carotid artery origin. Vertebral  arteries:Left vertebral artery is dominant. Moderate stenosis bilateral vertebral artery origins. Vessels are otherwise widely patent. Skeleton: No acute osseous process though bone windows have not been submitted. Status post median sternotomy. Grade 1 C3-4 anterolisthesis and grade 1 C4-5 retrolisthesis on degenerative basis. Moderate to severe C3-4, C4-5 and C5-6 degenerative disc, severe at C6-7 resulting in moderate to severe RIGHT C3-4, LEFT C4-5, RIGHT C5-6 neural foraminal narrowing. Other neck: Soft tissues of the neck are non-acute though, not tailored for evaluation. Cardiac pacer wires in RIGHT chest. CTA HEAD Anterior circulation: LEFT internal carotid artery occlusion to the level of the carotid terminus were there is a retrograde flow via robust LEFT posterior and anterior communicating arteries. Mild calcific atherosclerosis with widely patent RIGHT internal carotid artery. Supernumerary anterior cerebral artery arising from LEFT A1-2 junction. Widely patent anterior and middle cerebral arteries. Posterior circulation: Normal appearance of the vertebral arteries, vertebrobasilar junction and basilar artery, as well as main branch vessels. Bilateral posterior communicating arteries contribute to posterior circulation. Normal appearance of the posterior cerebral arteries. No hemodynamically significant stenosis, dissection, contrast extravasation or aneurysm within the anterior nor posterior circulation.  Mild general luminal irregularity of the intracranial vessels compatible with atherosclerosis. IMPRESSION: CT HEAD: No acute intracranial process. No abnormal intracranial enhancement. 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 Otherwise negative CT HEAD for age. 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 Moderate stenosis bilateral vertebral artery origins which are otherwise widely  patent. CTA HEAD: Retrograde flow to LEFT carotid terminus with complete circle of Willis. No emergent large vessel occlusion or high-grade stenosis. Mild luminal irregularity of the intracranial vessels compatible with atherosclerosis. Electronically Signed   By: Elon Alas M.D.   On: 12/13/2015 02:17   Dg Chest 1 View  12/17/2015  CLINICAL DATA:  Patient with history of leukocytosis. EXAM: CHEST 1 VIEW COMPARISON:  Chest radiograph 12/11/2015. FINDINGS: Stable cardiac and mediastinal contours status post median sternotomy. Multi lead pacer apparatus overlies right hemi thorax, leads are stable in position. No consolidative pulmonary opacities. No pleural effusion or pneumothorax. Old left mid clavicle fracture. IMPRESSION: No acute cardiopulmonary process. Electronically Signed   By: Lovey Newcomer M.D.   On: 12/17/2015 11:43   Dg Chest 2 View  12/31/2015  CLINICAL DATA:  Hypertension with atrial fibrillation. Concern for aspiration EXAM: CHEST  2 VIEW COMPARISON:  Dec 17, 2015 FINDINGS: There are bilateral pleural effusions. There is no edema or consolidation evident. Heart is mildly enlarged with pulmonary vascularity within normal limits. Pacemaker lead positions are unchanged. No adenopathy. Patient is status post coronary artery bypass grafting. No bone lesions evident. There is evidence of old trauma involving mid left clavicle. IMPRESSION: Bilateral pleural effusions with cardiomegaly. Suspect a degree of congestive heart failure. No airspace consolidation evident. Stable cardiac silhouette. Electronically Signed   By: Lowella Grip III M.D.   On: 12/31/2015 12:44   Ct Head Wo Contrast  12/31/2015  CLINICAL DATA:  Altered mental status, headache and confusion EXAM: CT HEAD WITHOUT CONTRAST TECHNIQUE: Contiguous axial images were obtained from the base of the skull through the vertex without intravenous contrast. COMPARISON:  None. FINDINGS: No acute intracranial hemorrhage. No focal mass  lesion. No CT evidence of acute infarction. No midline shift or mass effect. No hydrocephalus. Basilar cisterns are patent. There is extensive cortical atrophy. There is moderate periventricular white matter hypodensities. Proportional ventricular dilatation to atrophy. Mastoid air cells are clear. There is no air-fluid level within the RIGHT maxillary sinus. IMPRESSION: 1. No acute intracranial findings. 2. Significant atrophy and white matter microvascular disease unchanged. 3. Fluid level within the LEFT maxillary sinus consistent with sinusitis. Electronically Signed   By: Suzy Bouchard M.D.   On: 12/31/2015 15:53   Ct Head Wo Contrast  12/11/2015  CLINICAL DATA:  Decreased responsiveness beginning 1 hour ago. EXAM: CT HEAD WITHOUT CONTRAST TECHNIQUE: Contiguous axial images were obtained from the base of the skull through the vertex without intravenous contrast. COMPARISON:  11/24/2015 FINDINGS: Skull and Sinuses:Recently characterized incomplete left zygomaticomaxillary complex fractures. No new osseous abnormality. Chronic left maxillary sinusitis with near complete opacification. Visualized orbits: No acute finding Brain: No evidence of acute infarction, hemorrhage, hydrocephalus, or mass lesion/mass effect. Generalized atrophy. Diffuse chronic microvascular ischemic gliosis in the cerebral white matter. Chronic lacunar infarct in the right thalamus. Remote small cortical infarct in the high posterior right frontal cortex. IMPRESSION: 1. Stable since prior.  No acute intracranial finding. 2. Nondisplaced left facial fractures as described previously. Electronically Signed   By: Monte Fantasia M.D.   On: 12/11/2015 23:33  Ct Angio Neck W/cm &/or Wo/cm  12/13/2015  CLINICAL DATA:  Altered mental status since 1400 hours, combative and agitated. History of stroke, atrial fibrillation, hypertension, hyperlipidemia, diabetes, bladder cancer. EXAM: CT ANGIOGRAPHY HEAD AND NECK TECHNIQUE: Multidetector CT  imaging of the head and neck was performed using the standard protocol during bolus administration of intravenous contrast. Multiplanar CT image reconstructions and MIPs were obtained to evaluate the vascular anatomy. Carotid stenosis measurements (when applicable) are obtained utilizing NASCET criteria, using the distal internal carotid diameter as the denominator. CONTRAST:  50 cc Isovue 370 COMPARISON:  CT HEAD December 11, 2015 FINDINGS: CT HEAD INTRACRANIAL CONTENTS: The ventricles and sulci are normal for age. No intraparenchymal hemorrhage, mass effect nor midline shift. Small area RIGHT frontal encephalomalacia. Patchy supratentorial white matter hypodensities are within normal range for patient's age and though non-specific likely represent chronic small vessel ischemic disease. No acute large vascular territory infarcts. No abnormal extra-axial fluid collections. Basal cisterns are patent. Moderate calcific atherosclerosis of the carotid siphons. No abnormal intracranial enhancement. ORBITS: The included ocular globes and orbital contents are non-suspicious. Status post bilateral ocular lens implants. SINUSES: Soft tissue opacification LEFT maxillary sinus without expansion. Mastoid air cells are well aerated. SKULL/SOFT TISSUES: No skull fracture. No significant soft tissue swelling. CTA NECK Aortic arch: Normal appearance of the thoracic arch, normal branch pattern. Mild calcific atherosclerosis of the aortic arch. The origins of the innominate, left Common carotid artery and subclavian artery are widely patent. Right carotid system: Common carotid artery is widely patent, coursing in a straight line fashion. Normal appearance of the carotid bifurcation without hemodynamically significant stenosis by NASCET criteria. Mild eccentric calcific atherosclerosis. Normal appearance of the included internal carotid artery. Left carotid system: Common carotid artery is widely patent, coursing in a straight line  fashion. Calcific atherosclerosis and intimal thickening with occluded LEFT internal carotid artery from the origin through the intracranial segments. High-grade stenosis LEFT external carotid artery origin. Vertebral arteries:Left vertebral artery is dominant. Moderate stenosis bilateral vertebral artery origins. Vessels are otherwise widely patent. Skeleton: No acute osseous process though bone windows have not been submitted. Status post median sternotomy. Grade 1 C3-4 anterolisthesis and grade 1 C4-5 retrolisthesis on degenerative basis. Moderate to severe C3-4, C4-5 and C5-6 degenerative disc, severe at C6-7 resulting in moderate to severe RIGHT C3-4, LEFT C4-5, RIGHT C5-6 neural foraminal narrowing. Other neck: Soft tissues of the neck are non-acute though, not tailored for evaluation. Cardiac pacer wires in RIGHT chest. CTA HEAD Anterior circulation: LEFT internal carotid artery occlusion to the level of the carotid terminus were there is a retrograde flow via robust LEFT posterior and anterior communicating arteries. Mild calcific atherosclerosis with widely patent RIGHT internal carotid artery. Supernumerary anterior cerebral artery arising from LEFT A1-2 junction. Widely patent anterior and middle cerebral arteries. Posterior circulation: Normal appearance of the vertebral arteries, vertebrobasilar junction and basilar artery, as well as main branch vessels. Bilateral posterior communicating arteries contribute to posterior circulation. Normal appearance of the posterior cerebral arteries. No hemodynamically significant stenosis, dissection, contrast extravasation or aneurysm within the anterior nor posterior circulation. Mild general luminal irregularity of the intracranial vessels compatible with atherosclerosis. IMPRESSION: CT HEAD: No acute intracranial process. No abnormal intracranial enhancement. 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  Otherwise negative CT HEAD for age. 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 Moderate stenosis bilateral vertebral artery origins which are otherwise widely patent. CTA HEAD:  Retrograde flow to LEFT carotid terminus with complete circle of Willis. No emergent large vessel occlusion or high-grade stenosis. Mild luminal irregularity of the intracranial vessels compatible with atherosclerosis. Electronically Signed   By: Elon Alas M.D.   On: 12/13/2015 02:17   Dg Chest Port 1 View  01/01/2016  CLINICAL DATA:  Acute systolic congestive heart failure. EXAM: PORTABLE CHEST 1 VIEW COMPARISON:  12/31/2015 FINDINGS: Improved aeration in the lower chest is suggestive for decreasing pleural fluid. Heart size is within normal limits with median sternotomy wires. Again noted is a right-sided biventricular cardiac pacemaker. There is no evidence for pulmonary edema. No evidence for pneumothorax. Again noted is an old left clavicle fracture. IMPRESSION: Improved aeration in the lower chest suggests decreasing pleural effusions. No pulmonary edema. Electronically Signed   By: Markus Daft M.D.   On: 01/01/2016 07:38   Dg Chest Port 1 View  12/11/2015  CLINICAL DATA:  82 year old male with decreased responsiveness. EXAM: PORTABLE CHEST 1 VIEW COMPARISON:  Chest x-ray 12/08/2015. FINDINGS: Right pleural effusion. Probable atelectasis in the right lung base. Left lung is clear. No evidence of pulmonary edema. Mild cardiomegaly. Upper mediastinal contours are within normal limits. Atherosclerosis in the thoracic aorta. Right-sided biventricular pacemaker with lead tip projecting over the expected location of the right ventricle and overlying the left ventricle via the coronary sinus and coronary veins. Status post median sternotomy for CABG, including LIMA. IMPRESSION: 1. Right pleural effusion with probable subsegmental atelectasis in the right lower  lobe. 2. Mild cardiomegaly. 3. Atherosclerosis. Electronically Signed   By: Vinnie Langton M.D.   On: 12/11/2015 22:43   Dg Chest Port 1 View  12/08/2015  CLINICAL DATA:  Shortness of breath EXAM: PORTABLE CHEST 1 VIEW COMPARISON:  12/06/2015 FINDINGS: Chronic cardiomegaly. Biventricular pacer from the right with leads in stable position. Status post CABG. New hazy appearance at the right base with costophrenic sulcus blunting. No pneumothorax. Remote mid left clavicle fracture. IMPRESSION: New small right pleural effusion. Electronically Signed   By: Monte Fantasia M.D.   On: 12/08/2015 06:31   Dg Foot 2 Views Left  12/20/2015  CLINICAL DATA:  Patient with hindfoot and heel pain status post fall 1 week prior. Initial encounter. EXAM: LEFT FOOT - 2 VIEW COMPARISON:  None. FINDINGS: Limited exam given patient positioning. The great toe is flexed on the AP view. No definite evidence for acute fracture or dislocation. Vascular calcifications. Midfoot degenerative changes. IMPRESSION: Limited exam.  No acute osseous abnormality. Electronically Signed   By: Lovey Newcomer M.D.   On: 12/20/2015 12:09   Dg Swallowing Func-speech Pathology  12/17/2015  Objective Swallowing Evaluation: Type of Study: MBS-Modified Barium Swallow Study Patient Details Name: [name] MRN: [id number] Date of Birth: [birth year] Today's Date: 12/17/2015 Time: SLP Start Time (ACUTE ONLY): 1033-SLP Stop Time (ACUTE ONLY): 1054 SLP Time Calculation (min) (ACUTE ONLY): 21 min Past Medical History: Past Medical History Diagnosis Date . Atrial fibrillation (HCC)    Chronic . Coronary artery disease 2004   Status post CABG with LIMA-LAD, SVG-CFX. Hx of anterior apical infarct. . Renal insufficiency    Chronic . Hypertension  . Hyperlipidemia  . Gout  . CHF (congestive heart failure) (HCC)    with chronic ischemic cardiomypathy. EF of 10-15%.  CHF due to systolic dysfunction. Clinically doing well. [name] GERD (gastroesophageal reflux disease)  .  Pacemaker    Medtronic biventricular pacemaker, serial number [id number] . Type II diabetes mellitus ([place])    "been  off my RX for ~ 2 yr" (03/20/2013) . H/O hiatal hernia  . Arthritis    "right thumb" (03/20/2013) . Depression  . Anxiety  . Bladder cancer ([place])    "low grade; grade 1; non-invasive" (03/20/2013) . CKD (chronic kidney disease), stage III  Past Surgical History: Past Surgical History Procedure Laterality Date . Transurethral resection of prostate  1990's; 2004   "twice; injected chemo 1st time; didn't do that 2nd time" (03/20/2013) . Nasal septum surgery   . US echocardiography  04-17-2009   Est EF 10-15% . Cardiovascular stress test  04-17-2009   EF 29% . Coronary artery bypass graft  02/19/2003   LIMA-LAD, SVG-CFX . Carotid endarterectomy Right 1990's   "left side is still  100% blocked" (03/20/2013) . Icd lead removal Left 03/01/2013   Procedure: ICD LEAD REMOVAL;  Surgeon: Evans Lance, MD;  Location: [place];  Service: Cardiovascular;  Laterality: Left; . Pacemaker lead removal Left 03/01/2013   Procedure: PACEMAKER LEAD REMOVAL;  Surgeon: Evans Lance, MD;  Location: [place];  Service: Cardiovascular;  Laterality: Left; . Pacemaker insertion N/A 03/01/2013   Procedure: INSERTION PACEMAKER LEAD;  Surgeon: Evans Lance, MD;  Location: [place];  Service: Cardiovascular;  Laterality: N/A; . Hemorrhoid surgery     "lanced several years ago" (03/20/2013) . Tonsillectomy and adenoidectomy  1933 . Cataract extraction w/ intraocular lens  implant, bilateral Bilateral ~ 2000 . Bi-ventricular pacemaker insertion N/A 03/20/2013   Procedure: BI-VENTRICULAR PACEMAKER INSERTION (CRT-P);  Surgeon: Evans Lance, MD; Medtronic biventricular pacemaker, serial number [id number]; Laterality: Right . Flexible sigmoidoscopy N/A 12/05/2015   Procedure: FLEXIBLE SIGMOIDOSCOPY;  Surgeon: [name], MD;  Location: [place];  Service: Endoscopy;  Laterality: N/A; HPI: 82 y.o. male with medical history significant for atrial  fibrillation on Coumadin, CAD status post CABG, chronic kidney disease stage III, chronic systolic CHF, depression and anxiety, just discharged to inpatient rehabilitation 12/11/2015 after his hospitalization for GI bleed. Patient transferred from rehabilitation to the telemetry floor 12/12/2015 after he was found to be unresponsive. Subjective: pt alert, confused Assessment / Plan / Recommendation CHL IP CLINICAL IMPRESSIONS 12/17/2015 Therapy Diagnosis Moderate pharyngeal phase dysphagia Clinical Impression Pt presents with moderate-severe oropharyngeal dysphagia characterized by silent aspiration across all consistencies with diffuse weakness of musculature.  Pt demonstrated silent aspiration with thin, nectar and puree. Aspiration occured secondary to deep laryngeal penetration with thin during the swallow and occurred with other consistencies following the swallow secondary to vallecular and pyriform residue. Increased viscosity resulted in increased residue. Dry swallow was minimally effective to decrease residue. Poor BOT retraction, hyo-laryngeal excursion and epiglottic inversion. Suspect this is not an acute issue given general decline and debility, however this certainly increases pt's risk for poor nutrition and respiratory compromise. Compensatory strategies of chin tuck, effortful swallow and dry swallow were ineffective to eliminate aspiration or clear residue. Pt has difficulty with mastication of solids and has requested liquids only. Given all of the above information, recommend initiation of Full Liquid diet with strict aspiration precautions. OOB with all  meals, small sips/bites (no straws), intermittent cued throat clear and dry swallow, and meds whole with puree. Discussed POC with pt, RN and PA. All partied verbalized understanding.  Impact on safety and function Severe aspiration risk   CHL IP TREATMENT RECOMMENDATION 12/13/2015 Treatment Recommendations Therapy as outlined in treatment plan  below   Prognosis 12/17/2015 Prognosis for Safe Diet Advancement Fair Barriers to Reach Goals -- Barriers/Prognosis Comment -- CHL IP  DIET RECOMMENDATION 12/17/2015 SLP Diet Recommendations Other (Comment) Liquid Administration via Cup Medication Administration Whole meds with puree Compensations Slow rate;Small sips/bites;Multiple dry swallows after each bite/sip;Clear throat intermittently Postural Changes --   CHL IP OTHER RECOMMENDATIONS 12/17/2015 Recommended Consults -- Oral Care Recommendations Oral care BID Other Recommendations --   CHL IP FOLLOW UP RECOMMENDATIONS 12/14/2015 Follow up Recommendations Inpatient Rehab   CHL IP FREQUENCY AND DURATION 12/13/2015 Speech Therapy Frequency (ACUTE ONLY) min 2x/week Treatment Duration 2 weeks      CHL IP ORAL PHASE 12/17/2015 Oral Phase Impaired Oral - Pudding Teaspoon -- Oral - Pudding Cup -- Oral - Honey Teaspoon -- Oral - Honey Cup -- Oral - Nectar Teaspoon -- Oral - Nectar Cup -- Oral - Nectar Straw -- Oral - Thin Teaspoon -- Oral - Thin Cup -- Oral - Thin Straw -- Oral - Puree -- Oral - Mech Soft -- Oral - Regular -- Oral - Multi-Consistency -- Oral - Pill -- Oral Phase - Comment increased time required for A-P propulsion with all consistencies  CHL IP PHARYNGEAL PHASE 12/17/2015 Pharyngeal Phase Impaired Pharyngeal- Pudding Teaspoon -- Pharyngeal -- Pharyngeal- Pudding Cup -- Pharyngeal -- Pharyngeal- Honey Teaspoon -- Pharyngeal -- Pharyngeal- Honey Cup -- Pharyngeal -- Pharyngeal- Nectar Teaspoon -- Pharyngeal -- Pharyngeal- Nectar Cup -- Pharyngeal -- Pharyngeal- Nectar Straw -- Pharyngeal -- Pharyngeal- Thin Teaspoon -- Pharyngeal -- Pharyngeal- Thin Cup -- Pharyngeal -- Pharyngeal- Thin Straw -- Pharyngeal -- Pharyngeal- Puree -- Pharyngeal -- Pharyngeal- Mechanical Soft -- Pharyngeal -- Pharyngeal- Regular -- Pharyngeal -- Pharyngeal- Multi-consistency -- Pharyngeal -- Pharyngeal- Pill -- Pharyngeal -- Pharyngeal Comment Pt demonstrated silent aspiration with thin,  nectar and puree. Aspiration occured secondary to deep laryngeal penetration with thin during the swallow and occurred with other consistencies following the swallow secondary to vallecular and pyriform residue. Increased viscosity resulted in increased residue. Dry swallow was minimally effective to decrease residue. Poor BOT retraction, hyo-laryngeal excursion and epiglottic inversion.  CHL IP CERVICAL ESOPHAGEAL PHASE 12/17/2015 Cervical Esophageal Phase WFL Pudding Teaspoon -- Pudding Cup -- Honey Teaspoon -- Honey Cup -- Nectar Teaspoon -- Nectar Cup -- Nectar Straw -- Thin Teaspoon -- Thin Cup -- Thin Straw -- Puree -- Mechanical Soft -- Regular -- Multi-consistency -- Pill -- Cervical Esophageal Comment -- No flowsheet data found. Vinetta Bergamo MA, CCC-SLP 12/17/2015, 3:50 PM              Microbiology: Recent Results (from the past 240 hour(s))  Urine culture     Status: Abnormal   Collection Time: 12/28/15  1:00 PM  Result Value Ref Range Status   Specimen Description URINE, CLEAN CATCH  Final   Special Requests NONE  Final   Culture MULTIPLE SPECIES PRESENT, SUGGEST RECOLLECTION (A)  Final   Report Status 12/30/2015 FINAL  Final     Labs: Basic Metabolic Panel:  Recent Labs Lab 12/31/15 1133 01/01/16 0334 01/01/16 0919 01/02/16 0432 01/03/16 0445 01/04/16 0630  NA 136 135  --  143 135 136  K 5.3* 6.5* 4.9 3.9 5.9* 5.1  CL 102 100*  --  107 104 106  CO2 23 21*  --  27 21* 22  GLUCOSE 83 101*  --  133* 159* 149*  BUN 29* 31*  --  21* 33* 40*  CREATININE 1.56* 1.62*  --  1.43* 1.54* 1.51*  CALCIUM 8.1* 7.4*  --  9.2 7.5* 7.8*   Liver Function Tests:  Recent Labs Lab 12/31/15 1133  AST 40  ALT  18  ALKPHOS 182*  BILITOT 0.5  PROT 6.3*  ALBUMIN 2.4*   CBC:  Recent Labs Lab 12/31/15 1133 01/01/16 0334 01/02/16 0432 01/03/16 0445 01/04/16 0630  WBC 19.1* 27.5* 5.8 40.7* 29.9*  NEUTROABS 14.7* 23.9*  --   --   --   HGB 12.9* 11.3* 12.3* 11.8* 11.5*  HCT 39.2 36.0*  39.5 36.9* 35.7*  MCV 97.3 101.4* 89.6 100.8* 99.2  PLT 170 130* 193 76* 76*   BNP: BNP (last 3 results)  Recent Labs  12/06/15 0518 12/31/15 1551  BNP 2311.3* 2119.6*    Signed:  Marzetta Board  [facility] 01/05/2016, 2:42 PM

## 2016-01-05 NOTE — Progress Notes (Signed)
Patient has order to discharge to [facility]. IV and telemetry removed. Wife at bedside. Report given to [facility]. Patient to be transported via [place].

## 2016-01-05 NOTE — Telephone Encounter (Signed)
LMOVM reminding pt to send remote transmission.   

## 2016-01-05 NOTE — Progress Notes (Signed)
[facility] - 3E-08   [facility] RN liason note;  Received request from Laurey Morale RN and Felicita Gage LCSW for family interest in [facility] with request to transfer today.  Chart reviewed.  Met with patient's wife [name] to confirm interest and explain services.  Wife is agreeable to have her husband transferred today .   CSW is aware. Registration paperwork has been completed. Dr. Orpah Melter is to assume care per family request.   Please fax discharge summary for [phone].  RN please call report to [phone].   Please arrange for transport as soon as possible.      Thank you for this referral. Please call with any questions.   Mickie Kay, [facility] Liason  [phone]

## 2016-01-05 NOTE — Clinical Social Work Note (Signed)
CSW facilitated patient discharge including contacting patient family and facility to confirm patient discharge plans. Clinical information faxed to facility and family agreeable with plan. CSW arranged ambulance transport via [facility] to [facility]. RN to call report prior to discharge ([phone]).  CSW will sign off for now as social work intervention is no longer needed. Please consult us again if new needs arise.  Alexanderjames Berg, CSW [phone]  

## 2016-01-06 ENCOUNTER — Telehealth: Payer: Self-pay | Admitting: Internal Medicine

## 2016-01-06 NOTE — Telephone Encounter (Signed)
Dr. Caryl Comes saw the patient last in 2014. He has been seeing Dr. Lovena Le since that time. Will forward to Dr. Lorenz Coaster as an Juluis Rainier.

## 2016-01-06 NOTE — Telephone Encounter (Signed)
New message    Pt wife call to inform Dr.Klein that pt has been admitted into [place]

## 2016-01-08 ENCOUNTER — Encounter: Payer: Self-pay | Admitting: Cardiology

## 2016-01-14 DEATH — deceased

## 2017-07-26 IMAGING — CT CT ABD-PELV W/O CM
2 of 4 series · 11 of 46 positions shown, 12 images · non-contrast
Comparison: CT of the abdomen and pelvis from 10/15/2015

CLINICAL DATA: Acute onset of urinary retention and constipation.
Rectal burning. Initial encounter.

EXAM:
CT ABDOMEN AND PELVIS WITHOUT CONTRAST
TECHNIQUE: Multidetector CT imaging of the abdomen and pelvis was performed
following the standard protocol without IV contrast.

[Series 201: routine, idose (2) · axial · 0.71mm/px · z∈[+62,+412]mm · 8 of 84 slices shown, 9 images]
[im 7/84  soft-tissue]
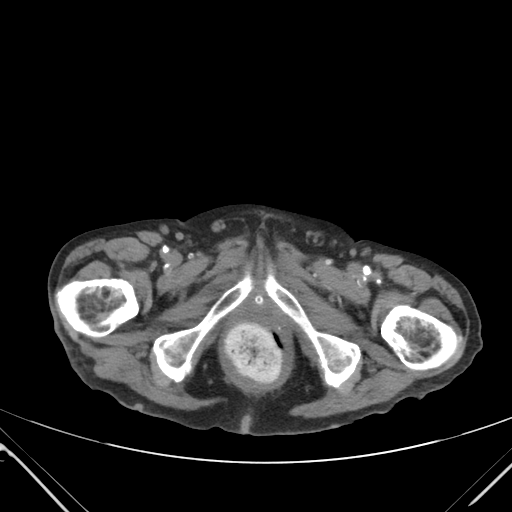
[im 7/84  bone]
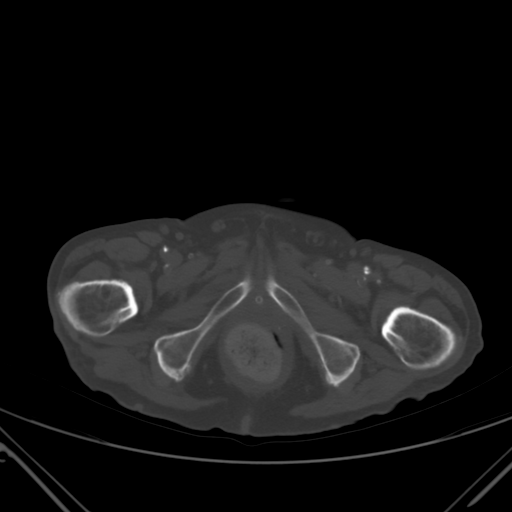
[im 18/84  soft-tissue]
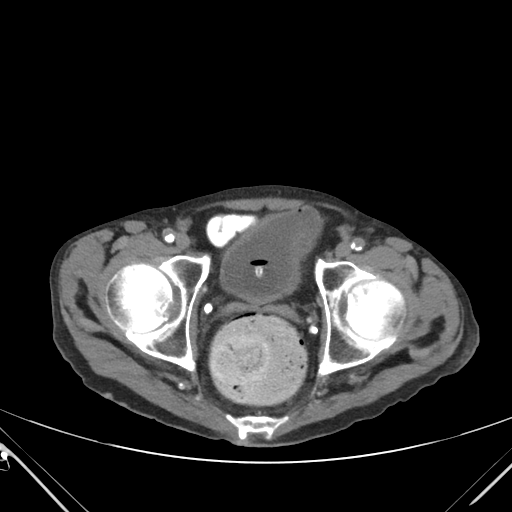
[im 28/84  soft-tissue]
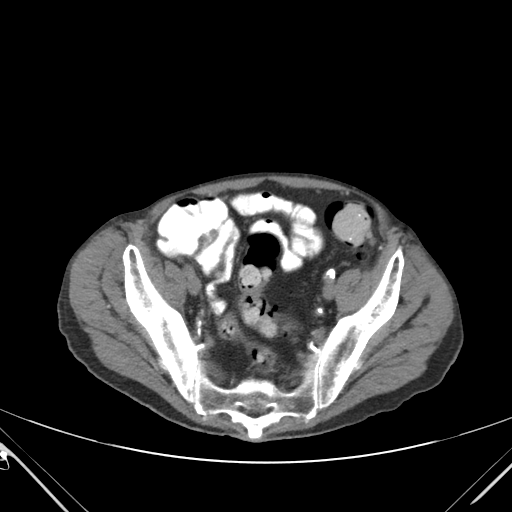
[im 39/84  soft-tissue]
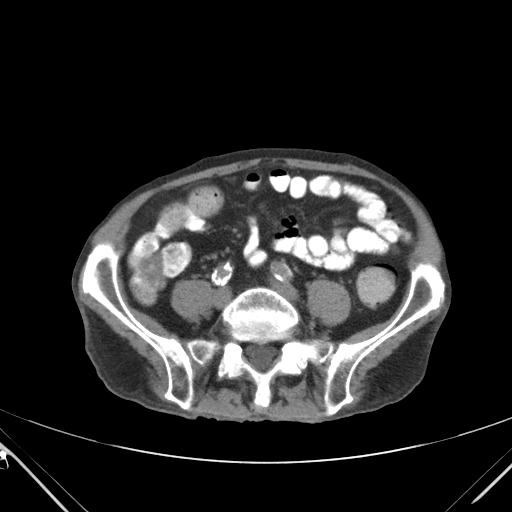
[im 45/84  soft-tissue]
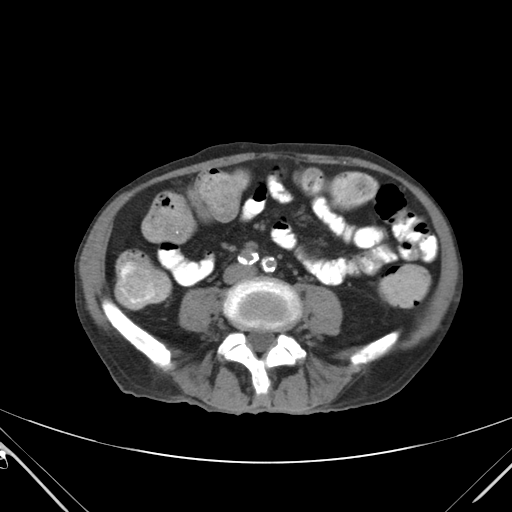
[im 56/84  soft-tissue]
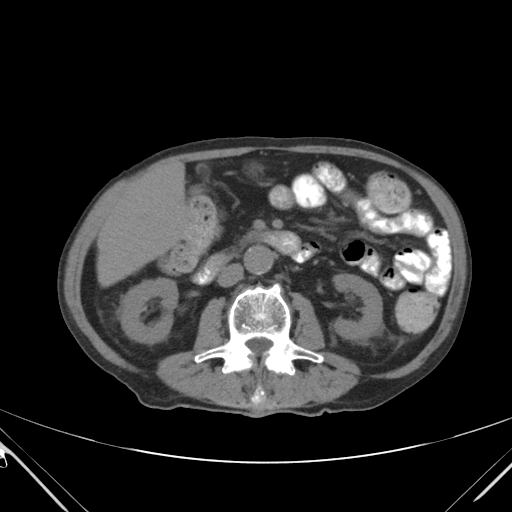
[im 66/84  soft-tissue]
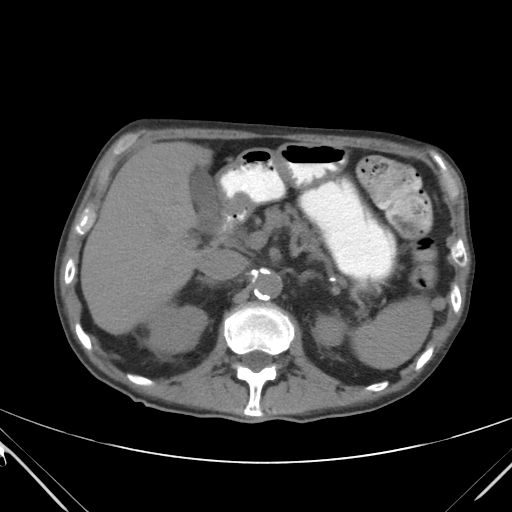
[im 77/84  soft-tissue]
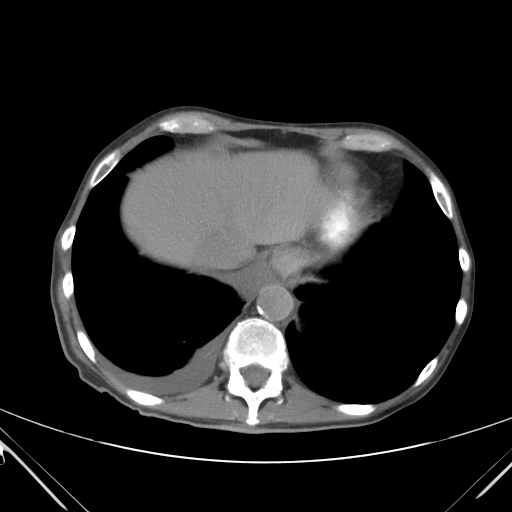

[Series 203: coronals, idose (2) · coronal · 0.45mm/px · 3 of 111 slices shown]
[im 37/111  soft-tissue]
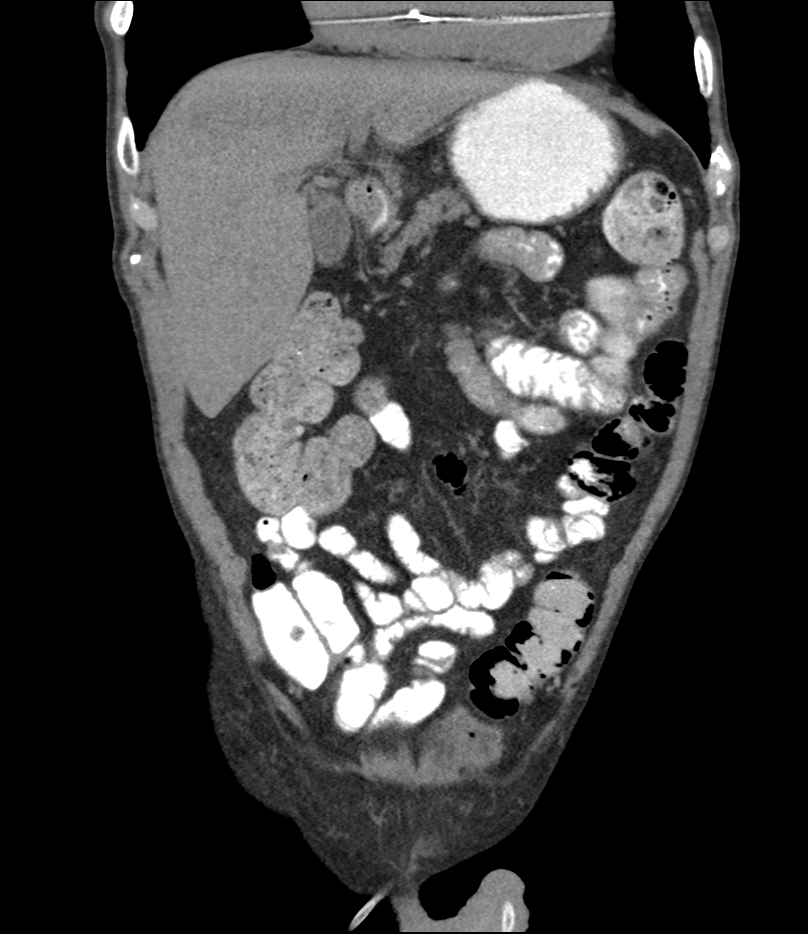
[im 49/111  soft-tissue]
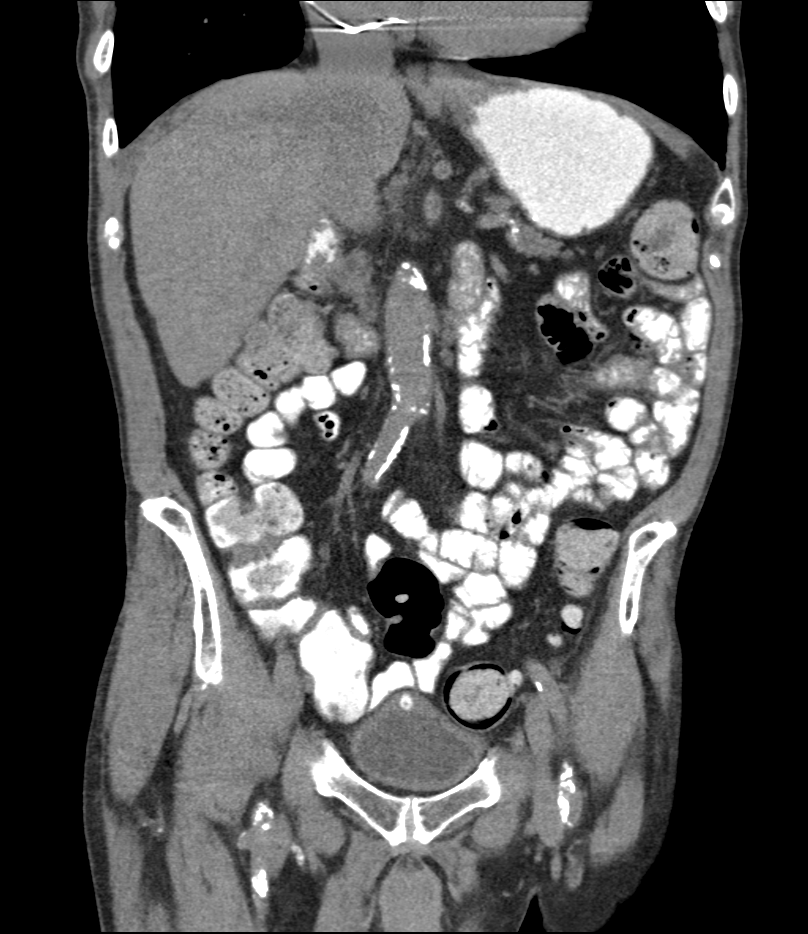
[im 62/111  soft-tissue]
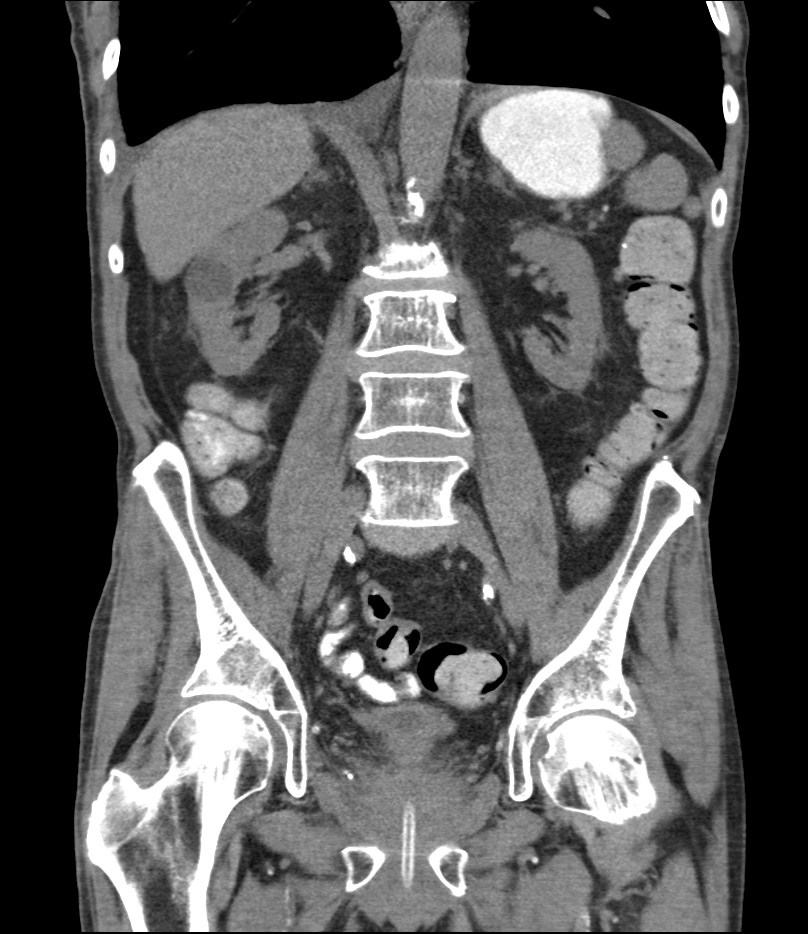

[11 of 46 positions shown; findings below may reference images not displayed]

FINDINGS: A trace right-sided pleural effusion is noted, with mild right
basilar atelectasis. Pacemaker leads are partially imaged.

The liver and spleen are unremarkable in appearance. The gallbladder
is within normal limits. The pancreas and adrenal glands are
unremarkable.

Scattered right renal cysts are seen, one of which demonstrates some
peripheral calcification. Nonspecific perinephric stranding is noted
bilaterally. There is no evidence of hydronephrosis. No renal or
ureteral stones are identified.

No free fluid is identified. The small bowel is unremarkable in
appearance. The stomach is within normal limits. No acute vascular
abnormalities are seen. Scattered calcification is seen along the
abdominal aorta and its branches, particularly prominent along the
common iliac arteries bilaterally.

The appendix is normal in caliber, without evidence of appendicitis.
Scattered diverticulosis is noted along the descending and sigmoid
colon, without evidence of diverticulitis. The rectum is largely
filled with stool, measuring 7.0 cm in transverse dimension.

The bladder is mildly distended, with a Foley catheter in place. The
prostate remains normal in size. No inguinal lymphadenopathy is
seen.

No acute osseous abnormalities are identified. Chronic bilateral
pars defects are seen at L5.
IMPRESSION: 1. Rectum largely filled with stool, measuring 7.0 cm in transverse
dimension, raising concern for mild fecal impaction. Would correlate
with the patient's symptoms. The remainder of the colon is largely
filled with stool.
2. Scattered diverticulosis along the descending and sigmoid colon,
without evidence of diverticulitis.
3. Scattered right renal cysts, one of which demonstrates some
peripheral calcification. Nonspecific bilateral perinephric
stranding is grossly stable.
4. Previously noted ascites has resolved.
5. Residual trace right pleural effusion, with mild right basilar
atelectasis.
6. Scattered calcification along the abdominal aorta and its
branches, particularly prominent along the common iliac arteries
bilaterally.
7. Chronic bilateral pars defects at L5, without evidence of
anterolisthesis.
